# Patient Record
Sex: Female | Born: 1940 | Race: White | Hispanic: No | Marital: Married | Smoking: Former smoker
Health system: Southern US, Community
[De-identification: ages and names within clinical notes are randomized; demographics above are authoritative.]

## PROBLEM LIST (undated history)

## (undated) DIAGNOSIS — R600 Localized edema: Secondary | ICD-10-CM

## (undated) DIAGNOSIS — I4891 Unspecified atrial fibrillation: Secondary | ICD-10-CM

## (undated) DIAGNOSIS — R002 Palpitations: Secondary | ICD-10-CM

## (undated) DIAGNOSIS — N6009 Solitary cyst of unspecified breast: Secondary | ICD-10-CM

## (undated) DIAGNOSIS — H269 Unspecified cataract: Secondary | ICD-10-CM

## (undated) DIAGNOSIS — C801 Malignant (primary) neoplasm, unspecified: Secondary | ICD-10-CM

## (undated) DIAGNOSIS — I491 Atrial premature depolarization: Secondary | ICD-10-CM

## (undated) DIAGNOSIS — I10 Essential (primary) hypertension: Secondary | ICD-10-CM

## (undated) DIAGNOSIS — K222 Esophageal obstruction: Secondary | ICD-10-CM

## (undated) DIAGNOSIS — M8008XA Age-related osteoporosis with current pathological fracture, vertebra(e), initial encounter for fracture: Secondary | ICD-10-CM

## (undated) DIAGNOSIS — E039 Hypothyroidism, unspecified: Secondary | ICD-10-CM

## (undated) DIAGNOSIS — K219 Gastro-esophageal reflux disease without esophagitis: Secondary | ICD-10-CM

## (undated) DIAGNOSIS — E785 Hyperlipidemia, unspecified: Secondary | ICD-10-CM

## (undated) DIAGNOSIS — K449 Diaphragmatic hernia without obstruction or gangrene: Secondary | ICD-10-CM

## (undated) DIAGNOSIS — S2239XA Fracture of one rib, unspecified side, initial encounter for closed fracture: Secondary | ICD-10-CM

## (undated) HISTORY — PX: BREAST BIOPSY: SHX20

## (undated) HISTORY — DX: Hyperlipidemia, unspecified: E78.5

## (undated) HISTORY — PX: PARTIAL HYSTERECTOMY: SHX80

## (undated) HISTORY — DX: Fracture of one rib, unspecified side, initial encounter for closed fracture: S22.39XA

## (undated) HISTORY — DX: Unspecified cataract: H26.9

## (undated) HISTORY — DX: Diaphragmatic hernia without obstruction or gangrene: K44.9

## (undated) HISTORY — DX: Palpitations: R00.2

## (undated) HISTORY — PX: CATARACT EXTRACTION: SUR2

## (undated) HISTORY — DX: Esophageal obstruction: K22.2

## (undated) HISTORY — DX: Essential (primary) hypertension: I10

## (undated) HISTORY — PX: EYE SURGERY: SHX253

## (undated) HISTORY — PX: THYROID LOBECTOMY: SHX420

## (undated) HISTORY — DX: Localized edema: R60.0

## (undated) HISTORY — PX: VERTEBROPLASTY: SHX113

## (undated) HISTORY — DX: Malignant (primary) neoplasm, unspecified: C80.1

## (undated) HISTORY — PX: APPENDECTOMY: SHX54

## (undated) HISTORY — DX: Atrial premature depolarization: I49.1

## (undated) HISTORY — PX: ABDOMINAL HYSTERECTOMY: SHX81

## (undated) HISTORY — DX: Gastro-esophageal reflux disease without esophagitis: K21.9

## (undated) HISTORY — PX: HAND SURGERY: SHX662

## (undated) HISTORY — DX: Hypothyroidism, unspecified: E03.9

## (undated) HISTORY — DX: Age-related osteoporosis with current pathological fracture, vertebra(e), initial encounter for fracture: M80.08XA

## (undated) HISTORY — DX: Solitary cyst of unspecified breast: N60.09

---

## 2002-01-05 ENCOUNTER — Emergency Department (HOSPITAL_COMMUNITY): Admission: EM | Admit: 2002-01-05 | Discharge: 2002-01-05 | Payer: Self-pay | Admitting: Emergency Medicine

## 2002-01-05 ENCOUNTER — Encounter: Payer: Self-pay | Admitting: Emergency Medicine

## 2002-03-31 ENCOUNTER — Encounter: Payer: Self-pay | Admitting: Internal Medicine

## 2002-03-31 ENCOUNTER — Emergency Department (HOSPITAL_COMMUNITY): Admission: EM | Admit: 2002-03-31 | Discharge: 2002-03-31 | Payer: Self-pay | Admitting: Internal Medicine

## 2003-02-20 ENCOUNTER — Ambulatory Visit (HOSPITAL_COMMUNITY): Admission: RE | Admit: 2003-02-20 | Discharge: 2003-02-20 | Payer: Self-pay | Admitting: Gastroenterology

## 2003-02-20 ENCOUNTER — Encounter: Payer: Self-pay | Admitting: Gastroenterology

## 2004-04-02 ENCOUNTER — Other Ambulatory Visit: Admission: RE | Admit: 2004-04-02 | Discharge: 2004-04-02 | Payer: Self-pay | Admitting: Family Medicine

## 2004-09-07 ENCOUNTER — Emergency Department (HOSPITAL_COMMUNITY): Admission: EM | Admit: 2004-09-07 | Discharge: 2004-09-07 | Payer: Self-pay | Admitting: Emergency Medicine

## 2005-10-26 ENCOUNTER — Ambulatory Visit (HOSPITAL_COMMUNITY): Admission: RE | Admit: 2005-10-26 | Discharge: 2005-10-26 | Payer: Self-pay | Admitting: Gastroenterology

## 2007-03-10 ENCOUNTER — Ambulatory Visit (HOSPITAL_COMMUNITY): Admission: RE | Admit: 2007-03-10 | Discharge: 2007-03-10 | Payer: Self-pay | Admitting: Gastroenterology

## 2007-04-13 ENCOUNTER — Ambulatory Visit (HOSPITAL_COMMUNITY): Admission: RE | Admit: 2007-04-13 | Discharge: 2007-04-13 | Payer: Self-pay | Admitting: Gastroenterology

## 2007-08-16 ENCOUNTER — Ambulatory Visit: Payer: Self-pay | Admitting: Cardiology

## 2007-08-23 ENCOUNTER — Emergency Department (HOSPITAL_COMMUNITY): Admission: EM | Admit: 2007-08-23 | Discharge: 2007-08-23 | Payer: Self-pay | Admitting: Emergency Medicine

## 2008-02-13 ENCOUNTER — Ambulatory Visit (HOSPITAL_COMMUNITY): Admission: RE | Admit: 2008-02-13 | Discharge: 2008-02-13 | Payer: Self-pay | Admitting: Gastroenterology

## 2008-07-02 ENCOUNTER — Ambulatory Visit: Payer: Self-pay | Admitting: Cardiology

## 2008-12-11 ENCOUNTER — Ambulatory Visit: Payer: Self-pay

## 2008-12-11 ENCOUNTER — Ambulatory Visit: Payer: Self-pay | Admitting: Cardiology

## 2009-04-23 ENCOUNTER — Ambulatory Visit: Payer: Self-pay | Admitting: Cardiology

## 2010-03-19 ENCOUNTER — Encounter: Admission: RE | Admit: 2010-03-19 | Discharge: 2010-06-17 | Payer: Self-pay | Admitting: Family Medicine

## 2010-10-05 LAB — HM MAMMOGRAPHY

## 2011-01-27 ENCOUNTER — Encounter: Payer: Self-pay | Admitting: Family Medicine

## 2011-01-27 DIAGNOSIS — E039 Hypothyroidism, unspecified: Secondary | ICD-10-CM

## 2011-01-27 DIAGNOSIS — K222 Esophageal obstruction: Secondary | ICD-10-CM

## 2011-01-27 DIAGNOSIS — K219 Gastro-esophageal reflux disease without esophagitis: Secondary | ICD-10-CM

## 2011-01-28 ENCOUNTER — Encounter: Payer: Self-pay | Admitting: Family Medicine

## 2011-01-28 DIAGNOSIS — K219 Gastro-esophageal reflux disease without esophagitis: Secondary | ICD-10-CM | POA: Insufficient documentation

## 2011-01-28 DIAGNOSIS — K222 Esophageal obstruction: Secondary | ICD-10-CM | POA: Insufficient documentation

## 2011-01-28 DIAGNOSIS — K5900 Constipation, unspecified: Secondary | ICD-10-CM | POA: Insufficient documentation

## 2011-01-28 DIAGNOSIS — M8000XA Age-related osteoporosis with current pathological fracture, unspecified site, initial encounter for fracture: Secondary | ICD-10-CM | POA: Insufficient documentation

## 2011-03-02 NOTE — Assessment & Plan Note (Signed)
[facility]                            CARDIOLOGY OFFICE NOTE   NAME:[name]                     MRN:          [id number]  DATE:08/16/2007                            DOB:          [birth year]    PRIMARY CARE PHYSICIAN:  Dr. Vernon Prey.   REASON FOR VISIT:  Evaluate patient with abnormal EKG.   HISTORY OF PRESENT ILLNESS:  The patient is a pleasant, 70 year old  without prior cardiac history. She was noted recently to have an  irregular heart rate. The EKG suggested the possibility of atrial  flutter. She was referred for evaluation of this. I reviewed this EKG  available from Dr. Kathi Der office. It demonstrates sinus rhythm with  multifocal premature atrial contractions.   The patient states she does occasionally note palpitations. However, she  does not have any episodes of presyncope or syncope. She does not have  any chest discomfort, neck or arm discomfort. She has had no shortness  of breath and denies any PND or orthopnea.   PAST MEDICAL HISTORY:  Hyperlipidemia, hypothyroidism, esophageal  stricture.   PAST SURGICAL HISTORY:  Thyroid surgery, cataract surgery, partial  hysterectomy.   ALLERGIES:  SULFA.   MEDICATIONS:  1. Lipitor 40 mg daily.  2. Nexium 40 mg daily.  3. Synthroid 100 mcg daily.  4. Lorazepam 15 mg t.i.d.   SOCIAL HISTORY:  The patient is a housewife. She is married. She has no  children. She smoked 1 pack per day for 45 years but quit 4 years ago.  She does not inhale cigarettes.   FAMILY HISTORY:  Contributory for early coronary disease in 1 brother  and 1 sister who are status post bypass.   REVIEW OF SYSTEMS:  As stated in the HPI and positive for wearing  glasses. Negative for other systems.   PHYSICAL EXAMINATION:  GENERAL:  The patient is in no distress.  VITAL SIGNS:  Blood pressure 122/88, heart rate 93 and regular, body  mass index 22, weight 122 pounds.  HEENT:  Eyelids unremarkable. Pupils  equal round and reactive to light.  Fundi not visualized. Oral mucosa unremarkable.  NECK:  No jugular venous distention at 45 degrees, carotid upstroke  brisk and symmetric, no bruits, no thyromegaly.  LYMPHATICS:  No cervical, axillary or inguinal adenopathy.  LUNGS:  Clear to auscultation bilaterally.  BACK:  No costovertebral angle tenderness.  CHEST:  Unremarkable.  HEART:  PMI not displaced or sustained, S1 and S2 within normal limits,  no S3, no clicks, no rubs, no murmurs.  ABDOMEN:  Flat, positive bowel sounds, normal in frequency and pitch, no  bruits, no rebound, no guarding, no midline pulsatile mass, no  hepatomegaly, no splenomegaly.  SKIN:  No rashes, no nodules.  EXTREMITIES:  2+ pulses upper, 2+ femorals bilaterally, 1+ popliteals,  dorsalis pedis and posterior tibialis bilaterally, no cyanosis, no  clubbing, no edema.  NEUROLOGIC:  Oriented to person, place and time. Cranial nerves II-XII  grossly intact. Motor grossly intact.   EKG:  Sinus rhythm, rate 93, axis within normal limits, interval is  within normal limits, premature atrial  contractions.   ASSESSMENT/PLAN:  1. Palpitations. The patient has premature atrial contractions but no      other dysrhythmia. These are not particularly problematic. At this      point, no further cardiovascular testing is suggested. I will defer      to Dr. Christell Constant to make sure that she has had thyroid function studies      and recent BMET and magnesium. She could come back to see me for      management if she has any further symptoms related to these.  2. Decreased peripheral pulses. The patient has some decreased      peripheral pulses. I do not appreciate any bruits or any other      evidence of vascular disease. She certainly does not have any      claudication and I questioned her carefully. If in the future, she      starts a walking regimen, as I suggest, and she finds any      limitations in her ability to do this she should  get ankle brachial      indices.  3. Followup. I will see her back as needed.     Rollene Rotunda, MD, [facility]  Electronically Signed    JH/MedQ  DD: 08/16/2007  DT: 08/17/2007  Job #: [id number]   cc:   Ernestina Penna, M.D.

## 2011-03-02 NOTE — Op Note (Signed)
NAMECALVIN, [name]              ACCOUNT NO.:  [id number]   MEDICAL RECORD NO.:  [id number]          PATIENT TYPE:  AMB   LOCATION:  ENDO                         FACILITY:  [facility]   PHYSICIAN:  Petra Kuba, M.D.    DATE OF BIRTH:  [birth year]   DATE OF PROCEDURE:  DATE OF DISCHARGE:                               OPERATIVE REPORT   PROCEDURE PERFORMED:  Esophagogastroduodenoscopy with Savary dilatation.   INDICATIONS:  Patient with a history of known strictures, worsening  dysphagia.  A consent was signed after the risks and benefits, methods,  and options thoroughly discussed in the office on multiple occasions.   MEDICATIONS USED:  Fentanyl 75 mcg, versed 10 mg.   PROCEDURE IN DETAIL:  First the video endoscope was inserted by direct  vision.  Unfortunately, her proximal ring at about 20-cm was fairly  tight and despite gentle pressure, could not advance past it.  We did  cause a small Mallory-Weiss tear just above it in trying gentle  pressure.  We elected to withdraw.  We then inserted the pediatric  videoendoscope and it easily was able to be advanced past both the  proximal stricture and the stricture at the GE junction, although that  was fairly tight as well.  She did have a small hiatal hernia.  The  second stricture was just above that.  The scope passed into the  stomach, advanced through a normal antrum, normal pylorus, into a normal  duodenal bulb and around the celiac to a normal second portion of the  duodenum.  The scope was withdrawn back to the bulb and a good look  there ruled out abnormalities in that location.  The scope was withdrawn  back to the stomach and retroflexed.  High in the cardia, the hiatal  hernia was confirmed.  The angularis, lesser and greater curve were  evaluated, and fundus was evaluated on retroflexed visualization without  additional findings.  Straight visualization of the stomach was normal.   The scope was then slowly withdraw back  to about 16 cm.  There was  minimal trauma of both the rings.  No significant tear.  We elected to  proceed with the dilation at this juncture.  The scope was inserted into  the antrum.  The Savary wire was advanced under fluoroscopic guidance.  The customary J loop of the wire was confirmed both endoscopically and  under fluoroscopy.  The scope was removed, making sure to keep the wire  in the proper position.  In succession, the Savary 8-, 10-, and 12-mm  dilators were all advanced.  After the 12 was confirmed in the stomach  and advancing the 12, there was minimal resistance.  There was not any  resistance on the 8 or the 10.  There was trace heme on the 12.  We  elected to stop the procedure at this junction.  The wire was withdrawn  back into the dilator, both were removed in tandem.  We did go ahead and  reinsert the pediatric endoscope which again confirmed the trauma to  both rings, but there was no active  bleeding or significant tears.  The  procedure was terminated at this junction.   The patient tolerated the procedure well.  There was no obvious  immediate complication.   ENDOSCOPIC DIAGNOSES:  1. Acute tight strictures at the proximal esophagus 20 cm.  Unable to      pass the regular scope; able to pass the pediatric endoscope past      both strictures.  2. Small hiatal hernia with a proximal ring just above it.  3. Otherwise normal esophagogastroduodenoscopy therapy Savary      dilatation to 12 mm under fluoroscopy only.   PLAN:  1. Continue Nexium.  2. Hold aspirin.  3. Very slowly advance diet.  4. Repeat endoscopy and dilation in two to three weeks and also      proceed with her screening colonoscopy at that juncture.      Hopefully, doing both will be safer now that she is slightly      dilated.           ______________________________  Petra Kuba, M.D.     MEM/MEDQ  D:  03/10/2007  T:  03/10/2007  Job:  [id number]   cc:   Ernestina Penna, M.D.

## 2011-03-02 NOTE — Assessment & Plan Note (Signed)
[facility]                            CARDIOLOGY OFFICE NOTE   NAME:Tilmon, HENRINE HAYTER                     MRN:          [id number]  DATE:12/11/2008                            DOB:          1941/06/09    PRIMARY CARE PHYSICIAN:  Ernestina Penna, MD   REASON FOR PRESENTATION:  Evaluate the patient with leg pain.   HISTORY OF PRESENT ILLNESS:  The patient presents for followup of the  above.  I saw him in September for the same complaint and ordered ABIs.  However, the patient did not have these done.  She actually thought that  the pain was getting better with aspirin.  However, she was referred  back because she had a severe episode on the November 23, 2008.  This was  discomfort in her legs that woke her up from her sleep.  She had ache  the whole way down her legs.  It slowly went away.  She did not describe  cramping.  She does not describe that this discomfort necessarily gets  worse with walking.  She is limited in her activities because of back  pain and also some abdominal cramping.  However, it is not clear on  discussing with her that she has limited by any leg cramping or joint  pain.  She does think that her legs ache more if she does not take the  aspirin.  She does not have any weakness in her legs.  She says she does  about what she wants around the house though her husband tries to limit  her because of her bad back.  She is not describing any chest pressure,  neck or arm discomfort.  She has had no palpitation, presyncope, or  syncope.   She did have ABIs today, which demonstrate mild reduction 0.77 on the  right, 0.75 on the left with probable tibial disease bilaterally.   PAST MEDICAL HISTORY:  1. Hyperlipidemia.  2. Hypothyroidism.  3. Esophageal stricture.   PAST SURGICAL HISTORY:  1. Thyroid surgery.  2. Cataract surgery.  3. Partial hysterectomy.   ALLERGIES:  SULFA.   MEDICATIONS:  1. Lipitor 80 mg daily.  2.  Levothyroxine 100 mcg daily.  3. Oxazepam.  4. Nexium 40 mg daily.  5. Hydrochlorothiazide 25 mg daily.  6. Aspirin 243 mg daily.  7. Potassium.   REVIEW OF SYSTEMS:  As stated in the HPI and otherwise negative for  other systems.   PHYSICAL EXAMINATION:  GENERAL:  The patient is in no distress.  VITAL SIGNS:  Blood pressure 115/77, heart rate 92 and regular, weight  117 pounds, body mass index 22.  HEENT:  Eyes unremarkable.  Pupils equal, round, and reactive to light,  fundi not visualized, oral mucosa unremarkable.  NECK:  No jugular venous distension at 45 degrees, carotid upstroke  brisk and symmetrical.  No bruits.  No thyromegaly.  LYMPHATIC:  No cervical, axillary, or inguinal adenopathy.  LUNGS:  Clear to auscultation bilaterally.  BACK:  No costovertebral angle tenderness.  CHEST:  Unremarkable.  HEART:  PMI not displaced or sustained.  S1  and S2 within normal limits.  No S3, no S4.  No clicks, no rubs, no murmurs.  ABDOMEN:  Flat, positive bowel sounds, normal in frequency and pitch.  No bruits, no rebound, no guarding.  No midline pulsatile mass, no  hepatomegaly, no splenomegaly.  SKIN:  No rashes, no nodules.  EXTREMITIES:  2+ pulses upper, 2+ femorals without bruits, 1+ dorsalis  pedis and posterior tibialis bilaterally.  No cyanosis, no clubbing, no  edema.  NEURO:  Oriented to person, place, and time; cranial nerves II-XII  grossly intact; motor grossly intact.   EKG; sinus rhythm, rate 92, axis within normal limits, intervals within  normal limits, no acute ST-wave changes.   ASSESSMENT AND PLAN:  1. Leg pain.  The patient's leg pain has some typical features for      claudication, but otherwise some atypical features.  She has this      discomfort intermittently, which makes me think it is probably not      the Lipitor, although I could not exclude this absolutely.      Finally, she does have back problems and it is not clear that this      is not  contributing.  At this point, I am not going to suggest      further therapy other than a walking regimen to see if we can      improve any of these symptoms.  She will let me know if her      symptoms worsen rather than get better.  She is going to continue      the aspirin.  We could try Pletal.  I could try a course of holding      the Lipitor though I would rather not do this.  I will be happy to      reassess her and could refer her to one of our vascular doctors as      well.  2. Dyslipidemia.  Per Dr. Christell Constant.  Given the fact that she has      peripheral vascular disease as indicated by her ABIs.  I will make      sure she has every aggressive goal with an LDL less than 70 and HDL      greater than 50.  3. Hypertension.  Blood pressure is well controlled and she will      continue the medications as listed.  4. Hypothyroidism.  This is followed by Dr. Christell Constant.  5. Followup.  I will see the patient again in 4 months or sooner if      needed.     Rollene Rotunda, MD, [facility]  Electronically Signed    JH/MedQ  DD: 12/11/2008  DT: 12/12/2008  Job #: [id number]   cc:   Ernestina Penna, M.D.

## 2011-03-02 NOTE — Op Note (Signed)
NAMEJENNA, [name]              ACCOUNT NO.:  [id number]   MEDICAL RECORD NO.:  [id number]          PATIENT TYPE:  AMB   LOCATION:  ENDO                         FACILITY:  [facility]   PHYSICIAN:  Petra Kuba, M.D.    DATE OF BIRTH:  [birth year]   DATE OF PROCEDURE:  02/13/2008  DATE OF DISCHARGE:                               OPERATIVE REPORT   PROCEDURE:  Esophagogastroduodenoscopy with Savary dilatation.   INDICATION:  Dysphagia, history of distal ring and held by dilatation.  Consent was signed after risks, benefits, methods, options thoroughly  discussed multiple times in the past.   MEDICINES USED:  Fentanyl 75 mcg, Versed 5 mg.   PROCEDURE:  The pediatric video endoscope was inserted by direct vision.  The esophagus was normal and distal esophagus was a fairly tight  stricture, benign-appearing ring.  The scope with very minimal  resistance and minimal trauma was able to be advanced into the stomach.  This was probably not as tight as it has been at some points in the  past.  She did have a small hiatal hernia.  Scope passed into a normal  antrum, normal pylorus into a normal duodenal bulb and around the C-loop  to a normal second portion of the duodenum.  Scope was withdrawn back to  the bulb and a good look there ruled out abnormalities at the end of the  case.  Scope was withdrawn back stomach and retroflexed.  High in the  cardia, the hiatal hernia was confirmed.  Fundus, angularis, lesser and  greater curve were normal on retroflexed visualization.  Straight  visualization of the stomach did not reveal any additional findings.  Scope was slowly withdrawn back to about 15 cm, again confirming the  normal esophagus.  Scope was then re-advanced to the antrum.  It did  pass more readily through the GE junction and under fluoro guidance, the  Savary wire was advanced, the customary J loop was confirmed  endoscopically and under fluoro.  The scope was removed making sure  that  the wire stayed in the proper position under fluoro and once removed in  succession, the Savary 11, 12.8, 14 and 15 mm dilators were all advanced  and confirmed in the proper position in the stomach.  There was trace  heme on the last three dilators.  There was only proximal resistance on  passing all the dilators and then at the GE junction.  Once the 15 was  confirmed in the stomach, the wire was withdrawn back into the dilator,  both were removed.  The procedure was terminated at this junction.  The  patient tolerated the procedure well.  There was no obvious immediate  complication.   ENDOSCOPIC DIAGNOSES:  1. Distal esophageal ring, minimal trauma passing the pediatric scope,      very minimal resistance.  2. Small hiatal hernia.  3. Otherwise normal esophagogastroduodenoscopy.   THERAPY:  Savary dilatation under fluoro to 15 mm.   PLAN:  See how this dilation works.  Follow-up p.r.n. or in two months.  Consider balloon dilatation to possibly be more aggressive since  it is  hard to know whether she has a proximal posterior pharynx, stricture or  just the smaller cervical esophagus.  However, the patient's dysphagia  seems to be more at the GE junction and not in her neck, so maybe she  would be better served with balloons in the future.           ______________________________  Petra Kuba, M.D.     MEM/MEDQ  D:  02/13/2008  T:  02/13/2008  Job:  [id number]   cc:   Ernestina Penna, M.D.  Fax: [phone]

## 2011-03-02 NOTE — Op Note (Signed)
[name], [name]              ACCOUNT NO.:  [id number]   MEDICAL RECORD NO.:  [id number]          PATIENT TYPE:  AMB   LOCATION:  ENDO                         FACILITY:  [facility]   PHYSICIAN:  Petra Kuba, M.D.    DATE OF BIRTH:  Jul 06, 1941   DATE OF PROCEDURE:  04/13/2007  DATE OF DISCHARGE:                               OPERATIVE REPORT   PROCEDURE:  EGD with Savary dilatation.   INDICATIONS:  Patient with recurrent strictures, only able to dilate to  12 last time, want to increase the dilation.  Consent was signed after  all risks, benefits, methods, options thoroughly discussed multiple  times in the past.   ADDITIONAL MEDICINES FOR THIS PROCEDURE:  Fentanyl 50 mcg, Versed 5 mg.   PROCEDURE:  The pediatric video endoscope was inserted by direct vision.  No obvious proximal stricture was seen.  Her esophagus was normal.  She  did have a distal fibrous ring and a small hiatal hernia.  Scope passed  into the stomach were some atrophic gastritis was seen, advanced through  a normal antrum, normal pylorus and into a normal duodenal bulb and  around the C-loop to a normal second portion of the duodenum.  Scope was  withdrawn back to the bulb and a good look there ruled out any  abnormalities in all locations.  The scope was withdrawn back to the  stomach and retroflexed.  High in the cardia the hiatal hernia was  confirmed.  The fundus, angularis, lesser and greater curve were normal  on retroflexed and straight visualization without additional stomach  findings.  Scope was withdrawn back to about 18 cm.  A good look at the  esophagus confirmed above findings.  Scope was then advanced to the  antrum and confirmed in the proper position under fluoro.  The Savary  wire was advanced.  The customary J loop was confirmed endoscopically  and under fluoro, and under fluoro guidance the scope was removed making  sure to keep the wire in the proper location.  We then proceeded with  dilation using the 12.8, 14 and 15 mm dilators.  There was minimal  resistance in advancing the 12.8 and the 15.  The 14 passed without  resistance.  There was no heme on the 12.8, with trace heme on both the  14 and the 15.  We elected to stop the procedure at this juncture.  The  wire was withdrawn back into the dilator and both were removed in  tandem.  The patient tolerated the procedure well.  There was no obvious  immediate complication.   ENDOSCOPIC DIAGNOSES:  1. Distal fibrous ring and small hiatal hernia.  2. Atrophic gastritis.  3. Otherwise normal EGD.   THERAPY:  Savary dilatation under fluoro to 15 mm.   PLAN:  Continue Nexium.  Slowly advance diet.  Begin with clear liquids  for 4 hours and then soft solids.  No aspirin or nonsteroidals for 3  days.  Follow up with me p.r.n. or in 2 months to recheck symptoms and  make sure no further workup plans or more aggressive  dilatation is  needed.           ______________________________  Petra Kuba, M.D.     MEM/MEDQ  D:  04/13/2007  T:  04/13/2007  Job:  [id number]   cc:   Ernestina Penna, M.D.  Fax: [phone]

## 2011-03-02 NOTE — Op Note (Signed)
[name], MANISCALCO              ACCOUNT NO.:  [id number]   MEDICAL RECORD NO.:  [id number]          PATIENT TYPE:  AMB   LOCATION:  ENDO                         FACILITY:  [facility]   PHYSICIAN:  Petra Kuba, M.D.    DATE OF BIRTH:  [birth year]   DATE OF PROCEDURE:  04/13/2007  DATE OF DISCHARGE:                               OPERATIVE REPORT   PROCEDURE:  Colonoscopy.   INDICATIONS:  Screening.   CONSENT:  Signed after risks, benefits, methods, and options thoroughly  discussed multiple times in the past.   MEDICINES USED:  Fentanyl 100 mcg, Versed 10 mg.   DESCRIPTION OF PROCEDURE:  Video endoscope was inserted by direct vision  after rectal inspection is pertinent for external hemorrhoids.  Small  digital exam was negative.  The scope was inserted and easily advanced  to the level of the ileocecal valve.  To advance to the cecal pole  required rolling her on her back and abdominal pressure.  No  abnormalities were seen on insertion.   The cecum was identified by the appendiceal orifice and ileocecal valve.  Prep was adequate.  There was some liquid stool that required washing  and suctioning.  On slow withdrawal through the colon no polyps, tumors,  masses, or diverticula were seen as we slowly withdrew back to the  rectum.  Once back in the rectum anorectal pull-through on retroflexion  confirmed some small hemorrhoids.   Scope was straightened and readvanced a short ways up the left side of  the colon.  Air was suctioned and the scope removed.  The patient  tolerated the procedure well.  There was no obvious immediate  complication.   ENDOSCOPIC DIAGNOSES:  1. Internal and external small hemorrhoids.  2. Otherwise within normal limits to the cecum.   PLAN:  Continue workup with an EGD and repeat dilation, to be more  aggressive than the last one.  Repeat colon screening p.r.n. or in 10  years.  We will decide followup based on dilation, but probably in 2-3  months to  recheck symptoms.           ______________________________  Petra Kuba, M.D.     MEM/MEDQ  D:  04/13/2007  T:  04/13/2007  Job:  [id number]   cc:   Ernestina Penna, M.D.  Fax: [phone]

## 2011-03-02 NOTE — Assessment & Plan Note (Signed)
[facility]                            CARDIOLOGY OFFICE NOTE   NAME:[name], [name]                     MRN:          [id number]  DATE:04/23/2009                            DOB:          Nov 07, 1940    PRIMARY CARE PHYSICIAN:  Ernestina Penna, MD   REASON FOR PRESENTATION:  Evaluate the patient with peripheral vascular  disease.   HISTORY OF PRESENT ILLNESS:  The patient presents for followup of the  above.  She is 70 years old now.  She has reduced ABIs as described  below.  I prescribed a walking regimen which she was doing although she  is not doing it as much in the heat.  She says she is not having as much  leg pain as she was having.  She is certainly not having any resting  pain.  She thinks she is feeling better.  She is not describing any new  shortness of breath or chest discomfort.  She has no new complaints that  would necessitate further change in therapy.  She is having no new joint  complaints, so I would not be able to implicate Lipitor as a cause of  her problems.   PAST MEDICAL HISTORY:  1. Peripheral vascular disease (ABI is 0.77 on the right, 0.75 on the      left).  2. Hyperlipidemia.  3. Hypothyroidism.  4. Esophageal stricture.  5. Thyroid surgery.  6. Cataract surgery.  7. Partial hysterectomy.   ALLERGIES:  SULFA.   MEDICATIONS:  1. Lipitor 80 mg daily.  2. Levothyroxine 100 mcg daily.  3. Nexium 40 mg daily.  4. Hydrochlorothiazide 25 mg daily.  5. Aspirin 81 mg 3 tablets daily.  6. Potassium.   REVIEW OF SYSTEMS:  As stated in the HPI and otherwise negative for all  other systems.   PHYSICAL EXAMINATION:  GENERAL:  The patient is pleasant and in no  distress.  VITAL SIGNS:  Blood pressure 138/86, heart rate 97 and regular.  HEENT:  Eyelids unremarkable.  Pupils equal, round, and reactive to  light.  Fundi not visualized.  Oral mucosa unremarkable.  NECK:  No jugular venous distention at 45 degrees,  carotid upstroke  brisk and symmetric.  No bruits, no thyromegaly.  LYMPHATICS:  No cervical, axillary, inguinal adenopathy.  LUNGS:  Clear to auscultation bilaterally.  BACK:  No costovertebral angle tenderness.  CHEST:  Unremarkable.  HEART:  PMI not displaced or sustained.  S1 and S2 within normal limits.  No S3, no S4.  No clicks, no rubs, no murmurs.  ABDOMEN:  Flat, positive bowel sounds normal in frequency and pitch.  No  bruits, no rebound, no guarding, no midline pulsatile mass, no  organomegaly.  SKIN:  No rashes, no nodules.  EXTREMITIES:  Upper pulse 2+, 2+ femorals without bruits, 1+ dorsalis  pedis and posterior tibialis bilaterally.  No cyanosis, no clubbing, no  edema.  NEUROLOGIC:  Grossly intact.   EKG:  Sinus rhythm, rate 97, axis within normal limits, intervals within  normal limits, no acute ST-T wave changes.   ASSESSMENT AND PLAN:  1. Leg pain.  The patient's leg pain is improved.  Therefore, I would      not suspect the Lipitor.  I do not think she needs to have Pletal      added.  I would suggest conservative therapy with a walking      regimen.  I would suggest continued aggressive secondary risk      reduction with particular attention paid to the dyslipidemia.  I      can see her back as needed if she has further pain.  She      understands the need for good foot hygiene.  2. Hypertension.  Blood pressure is controlled, and she will continue      the medicines as listed.  3. Dyslipidemia per Dr. Christell Constant with a goal LDL less than 100 and HDL      greater than 50.   FOLLOWUP:  I will see her back as needed.     Rollene Rotunda, MD, [facility]  Electronically Signed    JH/MedQ  DD: 04/23/2009  DT: 04/24/2009  Job #: [id number]   cc:   Ernestina Penna, M.D.

## 2011-03-02 NOTE — Assessment & Plan Note (Signed)
[facility]                            CARDIOLOGY OFFICE NOTE   NAME:Longino, COPELYN WIDMER                     MRN:          [id number]  DATE:07/02/2008                            DOB:          1941-10-10    PRIMARY CARE PHYSICIAN:  Ernestina Penna, MD.   REASON FOR PRESENTATION:  Evaluate the patient with leg pain.   HISTORY OF PRESENT ILLNESS:  The patient is referred back for evaluation  of leg discomfort.  I saw her in October 2008 for palpitations.  At that  time, I suggested no further no cardiovascular testing.  However, I was  impressed that she had decreased peripheral pulses, but she was not  feeling any claudication.  She said that recently she started noticing  leg pain.  She thought it was a significant burning, discomfort and  happened both at rest and with activity.  It was down in both legs.  She  was started taking aspirin 243 mg daily and she said that after this,  she has had resolution of this discomfort.  She is not longer having  that pain.  She is noticing some palpitations occasionally, but these  are in particularly problematic.  She is not having any presyncope or  syncope.  She is not describing any chest discomfort, neck, or arm  discomfort.  She has had no new shortness of breath, PND, or orthopnea.  She states she had a decreased appetite and has lost a few pounds.   PAST MEDICAL HISTORY:  1. Hyperlipidemia.  2. Hypothyroidism.  3. Esophageal stricture.   PAST SURGICAL HISTORY:  Thyroid surgery, cataract surgery, and partial  hysterectomy.   ALLERGIES:  SULFA.   MEDICATIONS:  1. Lipitor 80 mg daily.  2. Levothyroxine 100 mcg daily.  3. Oxazepam 15 mg p.r.n.  4. Nexium 40 mg daily.  5. Hydrochlorothiazide 25 mg daily.  6. Aspirin 243 mg daily.   REVIEW OF SYSTEMS:  As stated in the HPI and otherwise negative for all  other systems.   PHYSICAL EXAMINATION:  GENERAL:  The patient is in no acute distress.  VITAL  SIGNS:  Blood pressure 134/89, heart rate 111 and regular, and  weight 116 pounds.  HEENT:  Eyes Unremarkable.  Pupils equal round and reactive to light.  Fundi not visualized.  Oral mucosa unremarkable.  NECK:  No jugular venous distention at 45 degrees.  Carotid upstroke  brisk and symmetric.  No bruits, no thyromegaly.  LYMPHATICS:  No cervical, axillary, or inguinal adenopathy.  LUNGS:  Clear to auscultation bilaterally.  BACK:  No costovertebral angle tenderness.  CHEST:  Unremarkable.  HEART:  PMI not displaced or sustained.  S1 and S2 within normal limits,  no S3, and no S4.  No clicks, no rubs, and no murmurs.  ABDOMEN:  Flat.  Positive bowel sounds.  Normal in frequency and pitch.  No bruits, no rebound, and no guarding.  No midline pulsatile mass.  No  hepatomegaly, no splenomegaly.  SKIN:  No rashes, no nodules.  EXTREMITIES: 2+ upper pulses, 2+ femorals without bruits, 1+ popliteal  bilaterally, 1+ dorsalis  pedis and posterior tibialis bilaterally.  No  cyanosis, no clubbing, and no edema.  NEUROLOGIC:  Oriented to person, place, and time.  Cranial nerves II  through XII grossly intact.  Motor grossly intact.   EKG; sinus tachycardia, rate 105, axis within normal limits, intervals  within normal limits, and no acute ST-T wave changes.   ASSESSMENT AND PLAN:  1. Claudication.  The patient is describing claudication.  She has had      reduced pulses.  I am going to check ABIs.  However, her symptoms      are not much improved on aspirin, which may be all we need to do.      She would need continued risk reduction as well.  If she has any      recurrent discomfort after aspirin in a walking regimen, I would      suggest Peripheral Vascular consult with either Dr. Excell Seltzer or      McKenzie.  2. Tachycardia.  The patient has sinus tachycardia.  She is not      describing any significant palpitations at this point.  I would      defer to Dr. Christell Constant to see if she is up-to-date  with her thyroid      status.  Beyond that in the absence of any symptoms, I would      suggest no further cardiovascular testing.  3. Dyslipidemia.  The patient was followed by Dr. Christell Constant.  Given her      reduced lower extremity pulses that would be aggressive with her      lipid management with a goal LDL less than 100 and HDL greater than      50.  4. Followup.  I will see the patient again as needed in the based on      the results of the ABIs.     Rollene Rotunda, MD, [facility]  Electronically Signed    JH/MedQ  DD: 07/02/2008  DT: 07/03/2008  Job #: [id number]   cc:   Ernestina Penna, M.D.

## 2011-04-27 ENCOUNTER — Ambulatory Visit (HOSPITAL_COMMUNITY): Payer: Medicare HMO

## 2011-04-27 ENCOUNTER — Other Ambulatory Visit (INDEPENDENT_AMBULATORY_CARE_PROVIDER_SITE_OTHER): Payer: Self-pay | Admitting: Ophthalmology

## 2011-04-27 ENCOUNTER — Ambulatory Visit (HOSPITAL_COMMUNITY)
Admission: RE | Admit: 2011-04-27 | Discharge: 2011-04-28 | Disposition: A | Discharge status: Home / Self Care | Payer: Medicare HMO | Source: Ambulatory Visit | Attending: Ophthalmology | Admitting: Ophthalmology

## 2011-04-27 DIAGNOSIS — Z01812 Encounter for preprocedural laboratory examination: Secondary | ICD-10-CM | POA: Insufficient documentation

## 2011-04-27 DIAGNOSIS — Y831 Surgical operation with implant of artificial internal device as the cause of abnormal reaction of the patient, or of later complication, without mention of misadventure at the time of the procedure: Secondary | ICD-10-CM | POA: Insufficient documentation

## 2011-04-27 DIAGNOSIS — Z79899 Other long term (current) drug therapy: Secondary | ICD-10-CM | POA: Insufficient documentation

## 2011-04-27 DIAGNOSIS — I10 Essential (primary) hypertension: Secondary | ICD-10-CM | POA: Insufficient documentation

## 2011-04-27 DIAGNOSIS — Z01811 Encounter for preprocedural respiratory examination: Secondary | ICD-10-CM

## 2011-04-27 DIAGNOSIS — T8529XA Other mechanical complication of intraocular lens, initial encounter: Secondary | ICD-10-CM | POA: Insufficient documentation

## 2011-04-27 DIAGNOSIS — Z01818 Encounter for other preprocedural examination: Secondary | ICD-10-CM | POA: Insufficient documentation

## 2011-04-27 LAB — BASIC METABOLIC PANEL
BUN: 16 mg/dL (ref 6–23)
CO2: 32 mEq/L (ref 19–32)
Calcium: 9.2 mg/dL (ref 8.4–10.5)
GFR calc non Af Amer: >60 mL/min (ref >60)
Glucose, Bld: 92 mg/dL (ref 70–99)

## 2011-04-27 LAB — CBC
HCT: 35.9 % — ABNORMAL LOW (ref 36.0–46.0)
Hemoglobin: 12.1 g/dL (ref 12.0–15.0)
MCH: 30.2 pg (ref 26.0–34.0)
MCHC: 33.7 g/dL (ref 30.0–36.0)

## 2011-05-07 NOTE — Op Note (Signed)
[name], [name]              ACCOUNT NO.:  [id number]  MEDICAL RECORD NO.:  [id number]  LOCATION:  5128                         FACILITY:  [facility]  PHYSICIAN:  Beulah Gandy. Ashley Royalty, M.D. DATE OF BIRTH:  Nov 17, 1940  DATE OF PROCEDURE:  04/27/2011 DATE OF DISCHARGE:                              OPERATIVE REPORT   ADMISSION DIAGNOSIS:  Dislocated intraocular lens, left eye into the vitreous left eye.  PROCEDURES:  Pars plana vitrectomy left eye, retinal photocoagulation left eye, removal of IOL from vitreous left eye and secondary intraocular lens placement with suture, left eye.  SURGEON:  Beulah Gandy. Ashley Royalty, MD  ASSISTANT:  Rosalie Doctor, MA  ANESTHESIA:  General.  DETAILS:  Usual prep and drape, conjunctival peritomy from 8 o'clock around to 4 o'clock, half-thickness scleral flaps raised at 3 and 9 o'clock in anticipation of IOL suture.  Three layer corneal wound from 10 to 2 o'clock.  Trocars placed in the pars plana at 10, 2 and 4 o'clock with infusion at 4.  Pars plana vitrectomy was begun just behind the pupillary axis where the intraocular lens was seen to be ensnared in vitreous.  The vitreous was trimmed from around the intraocular lens. The capsular remnants were loose and not attached to the wall of the eye.  The corneoscleral wound was opened with keratome.  After the lens was freed from the vitreous entrapment, a 20-gauge forceps was used to grasp the intraocular lens and remove it from the posterior segment, then the anterior segment, and then out through the scleral wound. Additional vitrectomy was carried out at this point to remove all vitreous remnants from iris to wound.  Once the wound was swept, it was clean.  Two 10-0 Prolene sutures were placed beneath the scleral flaps from 3 o'clock to 9 o'clock behind the iris and in the ciliary sulcus. The scleral sutures were drawn out through the corneoscleral wound.  A new intraocular lens made by [employer]. model CC7-0BD, model 20.0D, length 12.5 mm, optic 7.0 mm, serial number [id number] was brought onto the field.  It was inspected and cleaned.  The eyelets of the lens were attached to Prolene sutures.  The intraocular lens was passed through the corneal wound, into the anterior chamber, then into the posterior chamber.  It was dialed into place.  The Prolene sutures were drawn securely and knotted beneath the scleral flaps.  The flaps were allowed to cover the knots.  Five interrupted 10-0 nylon sutures were used to close the corneoscleral wound.  The wound was tested and found to be tight.  Additional vitrectomy was carried out at this point. The indirect ophthalmoscope laser was moved into place and 699 burns were placed around the retinal periphery in weak and thin areas of retina tissue.  The power was 400 milliwatts, 1000 microns each, and 0.1 seconds each.  The instruments were removed from the eyes and the trocar was removed from the eye.  The wounds were tested and found to be tight. Closing pressure was 10 with a Risk manager.  The conjunctiva was closed with 7-0 chromic suture.  Polymyxin and gentamicin were irrigated into tenon space.  Marcaine was injected around  the globe for postop pain.  Decadron 10 mg was injected to the lower subconjunctival space. Polysporin ophthalmic ointment, patch, and shield were placed.  The patient was awakened and taken to recovery in satisfactory condition.  COMPLICATIONS:  None.  DURATION:  One hour 15 minutes.     Beulah Gandy. Ashley Royalty, M.D.     JDM/MEDQ  D:  04/27/2011  T:  04/28/2011  Job:  [id number]  Electronically Signed by Alan Mulder M.D. on 05/07/2011 09:55:03 AM

## 2011-05-25 ENCOUNTER — Encounter: Payer: Self-pay | Admitting: Cardiology

## 2011-06-04 ENCOUNTER — Encounter (INDEPENDENT_AMBULATORY_CARE_PROVIDER_SITE_OTHER): Payer: Medicare HMO | Admitting: Ophthalmology

## 2011-06-04 DIAGNOSIS — H43819 Vitreous degeneration, unspecified eye: Secondary | ICD-10-CM

## 2011-06-04 DIAGNOSIS — H2700 Aphakia, unspecified eye: Secondary | ICD-10-CM

## 2011-06-04 DIAGNOSIS — H35039 Hypertensive retinopathy, unspecified eye: Secondary | ICD-10-CM

## 2011-08-04 ENCOUNTER — Encounter (INDEPENDENT_AMBULATORY_CARE_PROVIDER_SITE_OTHER): Payer: Medicare HMO | Admitting: Ophthalmology

## 2012-10-26 ENCOUNTER — Encounter: Payer: Self-pay | Admitting: Cardiology

## 2013-02-14 ENCOUNTER — Encounter: Payer: Self-pay | Admitting: Family Medicine

## 2013-02-14 ENCOUNTER — Ambulatory Visit (INDEPENDENT_AMBULATORY_CARE_PROVIDER_SITE_OTHER): Payer: Medicare HMO | Admitting: Family Medicine

## 2013-02-14 VITALS — BP 99/70 | HR 75 | Temp 96.7°F | Ht 62.0 in | Wt 109.0 lb

## 2013-02-14 DIAGNOSIS — E785 Hyperlipidemia, unspecified: Secondary | ICD-10-CM

## 2013-02-14 DIAGNOSIS — F411 Generalized anxiety disorder: Secondary | ICD-10-CM

## 2013-02-14 DIAGNOSIS — E039 Hypothyroidism, unspecified: Secondary | ICD-10-CM | POA: Insufficient documentation

## 2013-02-14 DIAGNOSIS — I10 Essential (primary) hypertension: Secondary | ICD-10-CM

## 2013-02-14 DIAGNOSIS — E559 Vitamin D deficiency, unspecified: Secondary | ICD-10-CM

## 2013-02-14 DIAGNOSIS — K219 Gastro-esophageal reflux disease without esophagitis: Secondary | ICD-10-CM

## 2013-02-14 LAB — POCT CBC
HCT, POC: 38.1 % (ref 37.7–47.9)
MCH, POC: 30.7 pg (ref 27–31.2)
MCHC: 33.6 g/dL (ref 31.8–35.4)
POC Granulocyte: 4.7 (ref 2–6.9)
POC LYMPH PERCENT: 28.4 %L (ref 10–50)
RDW, POC: 12.9 %
WBC: 7 10*3/uL (ref 4.6–10.2)

## 2013-02-14 LAB — BASIC METABOLIC PANEL WITH GFR
BUN: 13 mg/dL (ref 6–23)
CO2: 28 mEq/L (ref 19–32)
Calcium: 9.5 mg/dL (ref 8.4–10.5)
Creat: 0.94 mg/dL (ref 0.50–1.10)
GFR, Est African American: 70 mL/min
Glucose, Bld: 81 mg/dL (ref 70–99)

## 2013-02-14 LAB — THYROID PANEL WITH TSH
Free Thyroxine Index: 3.5 (ref 1.0–3.9)
T4, Total: 11.6 ug/dL (ref 5.0–12.5)
TSH: 0.518 u[IU]/mL (ref 0.350–4.500)

## 2013-02-14 LAB — HEPATIC FUNCTION PANEL
ALT: 9 U/L (ref 0–35)
AST: 18 U/L (ref 0–37)
Albumin: 3.8 g/dL (ref 3.5–5.2)
Alkaline Phosphatase: 71 U/L (ref 39–117)
Total Bilirubin: 0.6 mg/dL (ref 0.3–1.2)
Total Protein: 6.7 g/dL (ref 6.0–8.3)

## 2013-02-14 MED ORDER — OXAZEPAM 15 MG PO CAPS: 15.0000 mg | ORAL_CAPSULE | Freq: Three times a day (TID) | ORAL | Status: DC

## 2013-02-14 NOTE — Patient Instructions (Addendum)
Fall precautions discussed Continue current meds and therapeutic lifestyle changes  try Nasacort AQ over-the-counter 1-2 sprays each nostril daily

## 2013-02-14 NOTE — Progress Notes (Signed)
  Subjective:    Patient ID: [name], female    DOB: [birth year], 72 y.o.   MRN: [id number]  HPI The biggest problem for the patient today is her reaction to pollen.   Review of Systems  Constitutional: Negative.   HENT: Positive for postnasal drip.   Eyes: Negative.   Respiratory: Negative.   Cardiovascular: Negative.   Gastrointestinal: Negative.   Endocrine: Negative.   Genitourinary: Negative.   Musculoskeletal: Negative.   Skin: Negative.   Allergic/Immunologic: Negative.   Neurological: Negative.   Hematological: Negative.   Psychiatric/Behavioral: Negative.        Objective:   Physical Exam BP 99/70  Pulse 75  Temp(Src) 96.7 F (35.9 C) (Oral)  Ht 5\' 2"  (1.575 m)  Wt 109 lb (49.442 kg)  BMI 19.93 kg/m2  The patient appeared well nourished and normally developed, alert and oriented to time and place. Speech, behavior and judgement appear normal. Vital signs as documented.  Head exam is unremarkable. No scleral icterus or pallor noted. She is edentulous. Mouth is okay. Neck is without jugular venous distension, thyromegally, or carotid bruits. Carotid upstrokes are brisk bilaterally. No cervical adenopathy. Lungs are clear anteriorly and posteriorly to auscultation. Normal respiratory effort. Breast exam was normal today. Cardiac exam reveals regular rate and rhythm at 60 per minute. First and second heart sounds normal. No murmurs, rubs or gallops.  Abdominal exam reveals , no masses, no organomegaly and no aortic enlargement. No inguinal adenopathy. Extremities are nonedematous and both femoral  pulses are normal. She has kyphosis. Skin without pallor or jaundice.  Warm and dry, without rash. Neurologic exam reveals normal deep tendon reflexes and normal sensation.          Assessment & Plan:  1. Essential hypertension, benign - BASIC METABOLIC PANEL WITH GFR; Standing - BASIC METABOLIC PANEL WITH GFR  2. Hyperlipemia - Hepatic function panel;  Standing - NMR Lipoprofile with Lipids; Standing - Hepatic function panel - NMR Lipoprofile with Lipids  3. GERD (gastroesophageal reflux disease) - POCT CBC; Standing - POCT CBC  4. Hypothyroid - Thyroid Panel With TSH  5. Vitamin D deficiency - Vitamin D 25 hydroxy; Standing - Vitamin D 25 hydroxy  6. Generalized anxiety disorder - oxazepam (SERAX) 15 MG capsule; Take 1 capsule (15 mg total) by mouth 3 (three) times daily.  Dispense: 90 capsule; Refill: 5    Patient Instructions  Fall precautions discussed Continue current meds and therapeutic lifestyle changes

## 2013-02-15 LAB — NMR LIPOPROFILE WITH LIPIDS
HDL Size: 9.1 nm — ABNORMAL LOW (ref >=9.2)
LDL Particle Number: 846 nmol/L (ref <1000)
LDL Size: 20.4 nm — ABNORMAL LOW (ref >20.5)
Large HDL-P: 5.2 umol/L (ref >=4.8)
Large VLDL-P: 1.2 nmol/L (ref <=2.7)
Small LDL Particle Number: 525 nmol/L (ref <=527)
Triglycerides: 95 mg/dL (ref <150)
VLDL Size: 44.9 nm (ref <=46.6)

## 2013-02-28 ENCOUNTER — Other Ambulatory Visit: Payer: Self-pay | Admitting: *Deleted

## 2013-02-28 MED ORDER — OMEPRAZOLE 40 MG PO CPDR: 40.0000 mg | DELAYED_RELEASE_CAPSULE | Freq: Every day | ORAL | Status: DC

## 2013-02-28 MED ORDER — LEVOTHYROXINE SODIUM 100 MCG PO TABS: 100.0000 ug | ORAL_TABLET | Freq: Every day | ORAL | Status: DC

## 2013-03-05 ENCOUNTER — Telehealth: Payer: Self-pay | Admitting: Family Medicine

## 2013-03-05 ENCOUNTER — Ambulatory Visit (INDEPENDENT_AMBULATORY_CARE_PROVIDER_SITE_OTHER): Payer: Medicare HMO | Admitting: General Practice

## 2013-03-05 ENCOUNTER — Encounter: Payer: Self-pay | Admitting: General Practice

## 2013-03-05 VITALS — BP 114/80 | HR 102 | Temp 99.2°F | Ht 62.0 in | Wt 107.0 lb

## 2013-03-05 DIAGNOSIS — R1011 Right upper quadrant pain: Secondary | ICD-10-CM

## 2013-03-05 NOTE — Progress Notes (Signed)
  Subjective:    Patient ID: [name], female    DOB: May 11, 1941, 72 y.o.   MRN: [id number]  HPI Presents today with right side abdominal  pain. Onset was last weekend and was seen at urgent care last Tuesday. Reports having large amount of stool in colon. Reports being constipated prior to visiting urgent care. Reports being prescribed hyoscyamine sulfate. OTC miralax. Reports bowel movements twice daily and normal color (brown) and soft. Reports right is aggrevated by moving or laying in certain positions. Reports pain score as 4 on 1-10 scales. Reports having abdominal surgery many years ago and unsure if appendix was removed.     Review of Systems  Constitutional: Negative for fever, chills and appetite change.  Respiratory: Negative for chest tightness and shortness of breath.   Cardiovascular: Negative for chest pain.  Gastrointestinal: Positive for abdominal pain. Negative for nausea, constipation, blood in stool and abdominal distention.  Genitourinary: Negative for flank pain, difficulty urinating and pelvic pain.  Musculoskeletal: Negative for back pain.  Skin: Negative.   Neurological: Negative for dizziness and headaches.  Psychiatric/Behavioral: Negative.        Objective:   Physical Exam  Constitutional: She is oriented to person, place, and time. She appears well-developed and well-nourished.  Cardiovascular: Normal rate, regular rhythm and normal heart sounds.   Pulmonary/Chest: Effort normal and breath sounds normal. No respiratory distress. She exhibits no tenderness.  Abdominal: Bowel sounds are normal. There is tenderness. There is tenderness at McBurney's point and positive Murphy's sign.  Neurological: She is alert and oriented to person, place, and time.  Skin: Skin is warm and dry.  Psychiatric: She has a normal mood and affect.          Assessment & Plan:  1. Abdominal pain, right upper quadrant -Advised patient to visit emergency room of choice for  evaluation (gall bladder and appendix) -Patient verbalized understanding and agreed to go if her pain worsened tonight. Otherwise she would go tomorrow

## 2013-03-05 NOTE — Telephone Encounter (Signed)
APT MADE 

## 2013-03-05 NOTE — Patient Instructions (Signed)
Cholecystitis  Cholecystitis is an inflammation of your gallbladder. It is usually caused by a buildup of gallstones or sludge (cholelithiasis) in your gallbladder. The gallbladder stores a fluid that helps digest fats (bile). Cholecystitis is serious and needs treatment right away.   CAUSES    Gallstones. Gallstones can block the tube that leads to your gallbladder, causing bile to build up. As bile builds up, the gallbladder becomes inflamed.   Bile duct problems, such as blockage from scarring or kinking.   Tumors. Tumors can stop bile from leaving your gallbladder correctly, causing bile to build up. As bile builds up, the gallbladder becomes inflamed.  SYMPTOMS    Nausea.   Vomiting.   Abdominal pain, especially in the upper right area of your abdomen.   Abdominal tenderness or bloating.   Sweating.   Chills.   Fever.   Yellowing of the skin and the whites of the eyes (jaundice).  DIAGNOSIS   Your caregiver may order blood tests to look for infection or gallbladder problems. Your caregiver may also order imaging tests, such as an ultrasound or computed tomography (CT) scan. Further tests may include a hepatobiliary iminodiacetic acid (HIDA) scan. This scan allows your caregiver to see your bile move from the liver to the gallbladder and to the small intestine.  TREATMENT   A hospital stay is usually necessary to lessen the inflammation of your gallbladder. You may be required to not eat or drink (fast) for a certain amount of time. You may be given medicine to treat pain or an antibiotic medicine to treat an infection. Surgery may be needed to remove your gallbladder (cholecystectomy) once the inflammation has gone down. Surgery may be needed right away if you develop complications such as death of gallbladder tissue (gangrene) or a tear (perforation) of the gallbladder.   HOME CARE INSTRUCTIONS   Home care will depend on your treatment. In general:   If you were given antibiotics, take them as  directed. Finish them even if you start to feel better.   Only take over-the-counter or prescription medicines for pain, discomfort, or fever as directed by your caregiver.   Follow a low-fat diet until you see your caregiver again.   Keep all follow-up visits as directed by your caregiver.  SEEK IMMEDIATE MEDICAL CARE IF:    Your pain is increasing and not controlled by medicines.   Your pain moves to another part of your abdomen or to your back.   You have a fever.   You have nausea and vomiting.  MAKE SURE YOU:   Understand these instructions.   Will watch your condition.   Will get help right away if you are not doing well or get worse.  Document Released: 10/04/2005 Document Revised: 12/27/2011 Document Reviewed: 08/20/2011  Exit[employer] Patient Information 2013 [employer].

## 2013-03-07 ENCOUNTER — Telehealth: Payer: Self-pay | Admitting: *Deleted

## 2013-03-07 NOTE — Telephone Encounter (Signed)
She went back to hospital with her abdominal pain after seeing [place]. She was given IV fluids and had a scan done.  They suggested she see a GI doctor.  She plans to schedule with Dr. Ewing Schlein since she has seen him before.  [place] aware of this.

## 2013-03-13 ENCOUNTER — Telehealth: Payer: Self-pay | Admitting: Pharmacist

## 2013-03-13 NOTE — Telephone Encounter (Signed)
I had contacted patient's insurance to determine cost of Prolia and get prior auth.  PA was approved on 02/20/13 through 02/21/15 but patient would need to get through pharmacy.  Called [facility] to find out copay info.  Copay will be $95 - but patient say this is still too expensive and she cannot afford.   Patient cannot take oral bisphosphonate due to swallowing difficulties.  Prolia too expensive.  Miacalcin caused nasal irritation.   Called [employer] to find out cost of Reclast - awaiting call back with info

## 2013-03-26 NOTE — Telephone Encounter (Signed)
Reclast was denied because of prior auth on file for prolia.  Will file an appeal to let [employer] know we are not using Prolia because of cost.

## 2013-03-28 ENCOUNTER — Other Ambulatory Visit: Payer: Self-pay | Admitting: *Deleted

## 2013-03-28 MED ORDER — OMEPRAZOLE 20 MG PO CPDR: 20.0000 mg | DELAYED_RELEASE_CAPSULE | Freq: Every day | ORAL | Status: DC

## 2013-07-02 ENCOUNTER — Ambulatory Visit (INDEPENDENT_AMBULATORY_CARE_PROVIDER_SITE_OTHER): Payer: Medicare HMO | Admitting: Family Medicine

## 2013-07-02 ENCOUNTER — Encounter: Payer: Self-pay | Admitting: Family Medicine

## 2013-07-02 VITALS — BP 100/67 | HR 87 | Temp 98.6°F | Ht 62.0 in | Wt 110.0 lb

## 2013-07-02 DIAGNOSIS — E785 Hyperlipidemia, unspecified: Secondary | ICD-10-CM

## 2013-07-02 DIAGNOSIS — E559 Vitamin D deficiency, unspecified: Secondary | ICD-10-CM

## 2013-07-02 DIAGNOSIS — I10 Essential (primary) hypertension: Secondary | ICD-10-CM

## 2013-07-02 DIAGNOSIS — R350 Frequency of micturition: Secondary | ICD-10-CM

## 2013-07-02 DIAGNOSIS — E039 Hypothyroidism, unspecified: Secondary | ICD-10-CM

## 2013-07-02 DIAGNOSIS — K219 Gastro-esophageal reflux disease without esophagitis: Secondary | ICD-10-CM

## 2013-07-02 DIAGNOSIS — R300 Dysuria: Secondary | ICD-10-CM

## 2013-07-02 DIAGNOSIS — F411 Generalized anxiety disorder: Secondary | ICD-10-CM

## 2013-07-02 LAB — POCT UA - MICROSCOPIC ONLY
Bacteria, U Microscopic: NEGATIVE
Casts, Ur, LPF, POC: NEGATIVE

## 2013-07-02 LAB — POCT URINALYSIS DIPSTICK
Bilirubin, UA: NEGATIVE
Glucose, UA: NEGATIVE
Nitrite, UA: NEGATIVE
Spec Grav, UA: 1.01
Urobilinogen, UA: NEGATIVE

## 2013-07-02 MED ORDER — NITROFURANTOIN MONOHYD MACRO 100 MG PO CAPS: 100.0000 mg | ORAL_CAPSULE | Freq: Two times a day (BID) | ORAL | Status: DC

## 2013-07-02 MED ORDER — PHENAZOPYRIDINE HCL 200 MG PO TABS: 200.0000 mg | ORAL_TABLET | Freq: Three times a day (TID) | ORAL | Status: DC | PRN

## 2013-07-02 NOTE — Progress Notes (Signed)
  Subjective:    Patient ID: [name], female    DOB: Jun 24, 1941, 72 y.o.   MRN: [id number]  HPI The patient returns to clinic today for followup of chronic medical problems. These include hypertension hyperlipidemia, hypothyroidism, GERD, general anxiety disorder, and vitamin D deficiency. She is also having some allergy issues with drainage and urinary tract symptoms x3-4 weeks.   Review of Systems  Constitutional: Positive for fatigue. Negative for activity change and appetite change.  HENT: Positive for postnasal drip. Negative for ear pain, congestion, sore throat and sinus pressure.   Eyes: Positive for itching. Negative for discharge, redness and visual disturbance.  Respiratory: Negative for cough, chest tightness, shortness of breath and wheezing.   Cardiovascular: Positive for leg swelling. Negative for chest pain and palpitations.  Gastrointestinal: Negative for nausea, vomiting, abdominal pain, diarrhea, constipation and blood in stool.  Endocrine: Negative.  Negative for cold intolerance, heat intolerance, polydipsia and polyphagia.  Genitourinary: Positive for dysuria, urgency and frequency.  Musculoskeletal: Negative for myalgias, back pain and arthralgias.  Skin: Negative.  Negative for color change, pallor, rash and wound.  Allergic/Immunologic: Negative.  Negative for environmental allergies and food allergies.  Neurological: Positive for headaches (occasional). Negative for dizziness, tremors and weakness.  Psychiatric/Behavioral: Positive for sleep disturbance (occasional). Negative for confusion and decreased concentration. The patient is not nervous/anxious.        Objective:   Physical Exam BP 100/67  Pulse 87  Temp(Src) 98.6 F (37 C) (Oral)  Ht 5\' 2"  (1.575 m)  Wt 110 lb (49.896 kg)  BMI 20.11 kg/m2  The patient appeared well nourished and normally developed, alert and oriented to time and place. Speech, behavior and judgement appear normal. Vital  signs as documented.  Head exam is unremarkable. No scleral icterus or pallor noted. Ears nose throat and mouth are all normal. She does wear dentures.  Neck is without jugular venous distension, thyromegally, or carotid bruits. Carotid upstrokes are brisk bilaterally. No cervical adenopathy. Lungs are clear anteriorly and posteriorly to auscultation. Normal respiratory effort. Cardiac exam reveals regular rate and rhythm at 72 per minute. First and second heart sounds normal.  No murmurs, rubs or gallops.  Abdominal exam reveals normal bowl sounds, no masses, no organomegaly and no aortic enlargement. No inguinal adenopathy. There is no suprapubic tenderness. Extremities are nonedematous and both femoral and pedal pulses are normal. Musculoskeletal intact Skin without pallor or jaundice.  Warm and dry, without rash. Neurologic exam reveals normal deep tendon reflexes and normal sensation.  Urinalysis is pending        Assessment & Plan:  1. Dysuria - POCT UA - Microscopic Only - POCT urinalysis dipstick - Urine culture  2. Urinary frequency - POCT UA - Microscopic Only - POCT urinalysis dipstick - Urine culture  3. Hypertension  4. Hyperlipemia  5. GERD (gastroesophageal reflux disease)  6. Hypothyroid  7. Vitamin D deficiency  8. Generalized anxiety disorder  Patient Instructions  Fall precautions discussed Continue current meds and therapeutic lifestyle changes Return to clinic in October for a flu shot   Nyra Capes MD

## 2013-07-02 NOTE — Patient Instructions (Addendum)
Fall precautions discussed Continue current meds and therapeutic lifestyle changes Return to clinic in October for a flu shot 

## 2013-07-04 LAB — URINE CULTURE: Organism ID, Bacteria: NO GROWTH

## 2013-07-20 ENCOUNTER — Other Ambulatory Visit: Payer: Self-pay | Admitting: *Deleted

## 2013-07-20 MED ORDER — HYDROCHLOROTHIAZIDE 25 MG PO TABS: 25.0000 mg | ORAL_TABLET | Freq: Every day | ORAL | Status: DC

## 2013-07-20 MED ORDER — POTASSIUM CHLORIDE 20 MEQ/15ML (10%) PO SOLN: ORAL | Status: DC

## 2013-07-20 MED ORDER — ATORVASTATIN CALCIUM 80 MG PO TABS: 80.0000 mg | ORAL_TABLET | Freq: Every day | ORAL | Status: DC

## 2013-07-23 ENCOUNTER — Other Ambulatory Visit (INDEPENDENT_AMBULATORY_CARE_PROVIDER_SITE_OTHER): Payer: Medicare HMO

## 2013-07-23 DIAGNOSIS — Z23 Encounter for immunization: Secondary | ICD-10-CM

## 2013-07-23 DIAGNOSIS — E785 Hyperlipidemia, unspecified: Secondary | ICD-10-CM

## 2013-07-23 DIAGNOSIS — E559 Vitamin D deficiency, unspecified: Secondary | ICD-10-CM

## 2013-07-23 DIAGNOSIS — K219 Gastro-esophageal reflux disease without esophagitis: Secondary | ICD-10-CM

## 2013-07-23 DIAGNOSIS — I10 Essential (primary) hypertension: Secondary | ICD-10-CM

## 2013-07-23 LAB — BASIC METABOLIC PANEL WITH GFR
BUN: 17 mg/dL (ref 6–23)
Chloride: 102 mEq/L (ref 96–112)
Glucose, Bld: 97 mg/dL (ref 70–99)
Potassium: 4.3 mEq/L (ref 3.5–5.3)
Sodium: 142 mEq/L (ref 135–145)

## 2013-07-23 LAB — HEPATIC FUNCTION PANEL
ALT: 10 U/L (ref 0–35)
AST: 18 U/L (ref 0–37)
Alkaline Phosphatase: 83 U/L (ref 39–117)
Bilirubin, Direct: 0.1 mg/dL (ref 0.0–0.3)
Indirect Bilirubin: 0.5 mg/dL (ref 0.0–0.9)

## 2013-07-23 LAB — POCT CBC
Granulocyte percent: 63.6 %G (ref 37–80)
HCT, POC: 38.3 % (ref 37.7–47.9)
MCV: 89.1 fL (ref 80–97)
RBC: 4.3 M/uL (ref 4.04–5.48)

## 2013-07-23 NOTE — Progress Notes (Signed)
Patient came in for labs only.

## 2013-07-24 LAB — NMR LIPOPROFILE WITH LIPIDS
Cholesterol, Total: 122 mg/dL (ref <200)
HDL Particle Number: 34.7 umol/L (ref >=30.5)
LDL Particle Number: 900 nmol/L (ref <1000)
LDL Size: 20.4 nm — ABNORMAL LOW (ref >20.5)
Large HDL-P: 4 umol/L — ABNORMAL LOW (ref >=4.8)
Large VLDL-P: 1.5 nmol/L (ref <=2.7)
Small LDL Particle Number: 475 nmol/L (ref <=527)
VLDL Size: 45.2 nm (ref <=46.6)

## 2013-08-21 ENCOUNTER — Other Ambulatory Visit: Payer: Self-pay

## 2013-08-21 DIAGNOSIS — F411 Generalized anxiety disorder: Secondary | ICD-10-CM

## 2013-08-21 MED ORDER — OXAZEPAM 15 MG PO CAPS: 15.0000 mg | ORAL_CAPSULE | Freq: Three times a day (TID) | ORAL | Status: DC

## 2013-08-21 NOTE — Telephone Encounter (Signed)
Last seen 07/02/13  DWM If approved route to nurse to phone into [facility]

## 2013-08-21 NOTE — Telephone Encounter (Signed)
This prescription is okay for one month with 2 refills

## 2013-08-22 NOTE — Telephone Encounter (Signed)
Phoned in med 

## 2013-11-05 ENCOUNTER — Ambulatory Visit (INDEPENDENT_AMBULATORY_CARE_PROVIDER_SITE_OTHER): Payer: Medicare HMO

## 2013-11-05 ENCOUNTER — Ambulatory Visit (INDEPENDENT_AMBULATORY_CARE_PROVIDER_SITE_OTHER): Payer: Medicare HMO | Admitting: Family Medicine

## 2013-11-05 ENCOUNTER — Encounter: Payer: Self-pay | Admitting: Family Medicine

## 2013-11-05 VITALS — BP 106/70 | HR 100 | Temp 98.3°F | Ht 62.0 in | Wt 111.0 lb

## 2013-11-05 DIAGNOSIS — Z23 Encounter for immunization: Secondary | ICD-10-CM

## 2013-11-05 DIAGNOSIS — R071 Chest pain on breathing: Secondary | ICD-10-CM

## 2013-11-05 DIAGNOSIS — E039 Hypothyroidism, unspecified: Secondary | ICD-10-CM

## 2013-11-05 DIAGNOSIS — E559 Vitamin D deficiency, unspecified: Secondary | ICD-10-CM

## 2013-11-05 DIAGNOSIS — R0789 Other chest pain: Secondary | ICD-10-CM

## 2013-11-05 DIAGNOSIS — E785 Hyperlipidemia, unspecified: Secondary | ICD-10-CM

## 2013-11-05 DIAGNOSIS — K219 Gastro-esophageal reflux disease without esophagitis: Secondary | ICD-10-CM

## 2013-11-05 NOTE — Progress Notes (Signed)
Subjective:    Patient ID: [name], female    DOB: 25-Nov-1940, 73 y.o.   MRN: [id number]  HPI Pt here for follow up and management of chronic medical problems. Patient complains of pain in the right rib area. This has been known for about 2 weeks it may or may not be any better at this time. Patient is due for her Prevnar today and an FOBT. She will return to the clinic for future lab work.        Patient Active Problem List   Diagnosis Date Noted  . Hyperlipemia 02/14/2013  . Hypothyroid 02/14/2013  . Vitamin D deficiency 02/14/2013  . Generalized anxiety disorder 02/14/2013  . Constipation   . Esophageal stricture   . Osteoporosis   . GERD (gastroesophageal reflux disease)    Outpatient Encounter Prescriptions as of 11/05/2013  Medication Sig  . atorvastatin (LIPITOR) 80 MG tablet Take 1 tablet (80 mg total) by mouth daily.  . Cholecalciferol (VITAMIN D3) 5000 UNITS TABS Take 1 tablet by mouth daily.    . hydrochlorothiazide (HYDRODIURIL) 25 MG tablet Take 1 tablet (25 mg total) by mouth daily.  Marland Kitchen levothyroxine (SYNTHROID, LEVOTHROID) 100 MCG tablet Take 1 tablet (100 mcg total) by mouth daily before breakfast. 1/2 tab qd except 1 on m and f  . omeprazole (PRILOSEC) 20 MG capsule Take 1 capsule (20 mg total) by mouth daily.  Marland Kitchen oxazepam (SERAX) 15 MG capsule Take 1 capsule (15 mg total) by mouth 3 (three) times daily.  . potassium chloride 20 MEQ/15ML (10%) SOLN Take 1 and 1/2 tsp. qd  . triamcinolone cream (KENALOG) 0.1 %   . [DISCONTINUED] hyoscyamine (ANASPAZ) 0.125 MG TBDP Take by mouth every 8 (eight) hours as needed.  . [DISCONTINUED] nitrofurantoin, macrocrystal-monohydrate, (MACROBID) 100 MG capsule Take 1 capsule (100 mg total) by mouth 2 (two) times daily.  . [DISCONTINUED] phenazopyridine (PYRIDIUM) 200 MG tablet Take 1 tablet (200 mg total) by mouth 3 (three) times daily as needed for pain.    Review of Systems  Constitutional: Negative.   HENT:  Negative.   Eyes: Negative.   Respiratory: Negative.   Cardiovascular: Negative.   Gastrointestinal: Negative.   Endocrine: Negative.   Genitourinary: Negative.   Musculoskeletal: Positive for myalgias (pain in right rib area).  Skin: Negative.   Allergic/Immunologic: Negative.   Neurological: Negative.   Hematological: Negative.   Psychiatric/Behavioral: Negative.        Objective:   Physical Exam  Nursing note and vitals reviewed. Constitutional: She is oriented to person, place, and time. She appears well-developed and well-nourished.  For her age  HENT:  Head: Normocephalic and atraumatic.  Right Ear: External ear normal.  Left Ear: External ear normal.  Nose: Nose normal.  Mouth/Throat: Oropharynx is clear and moist.  Upper dentures in place  Eyes: Conjunctivae and EOM are normal. Pupils are equal, round, and reactive to light. Right eye exhibits no discharge. Left eye exhibits no discharge. No scleral icterus.  Neck: Normal range of motion. Neck supple. No JVD present. No thyromegaly present.  Cardiovascular: Normal rate, regular rhythm, normal heart sounds and intact distal pulses.  Exam reveals no gallop and no friction rub.   No murmur heard. At 96 per minute  Pulmonary/Chest: Effort normal and breath sounds normal. No respiratory distress. She has no wheezes. She has no rales. She exhibits no tenderness.  Abdominal: Soft. Bowel sounds are normal. She exhibits no mass. There is no tenderness. There is no rebound and  no guarding.  Musculoskeletal: Normal range of motion. She exhibits no edema and no tenderness.  Kyphosis, right lateral chest and tenderness with no rash  Lymphadenopathy:    She has no cervical adenopathy.  Neurological: She is alert and oriented to person, place, and time. She has normal reflexes. No cranial nerve deficit.  Skin: Skin is warm and dry. No rash noted.  Psychiatric: She has a normal mood and affect. Her behavior is normal. Judgment and  thought content normal.   BP 106/70  Pulse 100  Temp(Src) 98.3 F (36.8 C) (Oral)  Ht _0  (1.575 m)  Wt 111 lb (50.349 kg)  BMI 20.30 kg/m2  [facility] reading (PRIMARY) by  Dr. 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 GERD (gastroesophageal reflux disease) - POCT CBC; Future  2. Hyperlipemia - POCT CBC; Future - BMP8+EGFR; Future - Hepatic function panel; Future - NMR, lipoprofile; Future  3. Hypothyroid - POCT CBC; Future - Thyroid Panel With TSH; Future  4. Vitamin D deficiency - POCT CBC; Future - Vit D  25 hydroxy (rtn osteoporosis monitoring); Future  5. Chest wall pain -Right rib detail  Patient Instructions  Continue current medications. Continue good therapeutic lifestyle changes which include good diet and exercise. Fall precautions discussed with patient. Schedule your flu vaccine if you haven't had it yet If you are over 13 years old - you may need Prevnar 33 or the adult Pneumonia vaccine. Use warm wet compresses to right chest 20 minutes 3 or 4 times daily   Arrie Senate MD

## 2013-11-05 NOTE — Patient Instructions (Addendum)
Continue current medications. Continue good therapeutic lifestyle changes which include good diet and exercise. Fall precautions discussed with patient. Schedule your flu vaccine if you haven't had it yet If you are over 75 years old - you may need Prevnar 15 or the adult Pneumonia vaccine. Use warm wet compresses to right chest 20 minutes 3 or 4 times daily

## 2013-11-05 NOTE — Addendum Note (Signed)
Addended by: Zannie Cove on: 11/05/2013 04:35 PM   Modules accepted: Orders

## 2013-11-06 ENCOUNTER — Other Ambulatory Visit (INDEPENDENT_AMBULATORY_CARE_PROVIDER_SITE_OTHER): Payer: Medicare HMO

## 2013-11-06 DIAGNOSIS — E039 Hypothyroidism, unspecified: Secondary | ICD-10-CM

## 2013-11-06 DIAGNOSIS — E785 Hyperlipidemia, unspecified: Secondary | ICD-10-CM

## 2013-11-06 DIAGNOSIS — E559 Vitamin D deficiency, unspecified: Secondary | ICD-10-CM

## 2013-11-06 DIAGNOSIS — K219 Gastro-esophageal reflux disease without esophagitis: Secondary | ICD-10-CM

## 2013-11-06 NOTE — Addendum Note (Signed)
Addended by: Pollyann Kennedy F on: 11/06/2013 11:08 AM   Modules accepted: Orders

## 2013-11-06 NOTE — Progress Notes (Signed)
Pt came in for labs only 

## 2013-11-07 LAB — CBC WITH DIFFERENTIAL
BASOS ABS: 0 10*3/uL (ref 0.0–0.2)
Basos: 1 %
Eos: 3 %
Eosinophils Absolute: 0.2 10*3/uL (ref 0.0–0.4)
HEMATOCRIT: 37.4 % (ref 34.0–46.6)
Hemoglobin: 12.5 g/dL (ref 11.1–15.9)
Immature Grans (Abs): 0 10*3/uL (ref 0.0–0.1)
Immature Granulocytes: 0 %
LYMPHS ABS: 2 10*3/uL (ref 0.7–3.1)
Lymphs: 32 %
MCH: 30.2 pg (ref 26.6–33.0)
MCHC: 33.4 g/dL (ref 31.5–35.7)
MCV: 90 fL (ref 79–97)
MONOS ABS: 0.5 10*3/uL (ref 0.1–0.9)
Monocytes: 8 %
Neutrophils Absolute: 3.5 10*3/uL (ref 1.4–7.0)
Neutrophils Relative %: 56 %
PLATELETS: 246 10*3/uL (ref 150–379)
RBC: 4.14 x10E6/uL (ref 3.77–5.28)
RDW: 14 % (ref 12.3–15.4)
WBC: 6.2 10*3/uL (ref 3.4–10.8)

## 2013-11-08 LAB — NMR, LIPOPROFILE
Cholesterol: 146 mg/dL (ref <200)
HDL Cholesterol by NMR: 48 mg/dL (ref >=40)
HDL Particle Number: 33.2 umol/L (ref >=30.5)
LDL PARTICLE NUMBER: 1037 nmol/L — AB (ref <1000)
LDL SIZE: 20.8 nm (ref >20.5)
LDLC SERPL CALC-MCNC: 73 mg/dL (ref <100)
LP-IR SCORE: 42 (ref <=45)
SMALL LDL PARTICLE NUMBER: 534 nmol/L — AB (ref <=527)
Triglycerides by NMR: 123 mg/dL (ref <150)

## 2013-11-08 LAB — BMP8+EGFR
BUN / CREAT RATIO: 16 (ref 11–26)
BUN: 17 mg/dL (ref 8–27)
CALCIUM: 9.5 mg/dL (ref 8.7–10.3)
CO2: 27 mmol/L (ref 18–29)
CREATININE: 1.09 mg/dL — AB (ref 0.57–1.00)
Chloride: 99 mmol/L (ref 97–108)
GFR calc Af Amer: 58 mL/min/{1.73_m2} — ABNORMAL LOW (ref >59)
GFR, EST NON AFRICAN AMERICAN: 50 mL/min/{1.73_m2} — AB (ref >59)
Glucose: 87 mg/dL (ref 65–99)
Potassium: 3.7 mmol/L (ref 3.5–5.2)
SODIUM: 143 mmol/L (ref 134–144)

## 2013-11-08 LAB — VITAMIN D 25 HYDROXY (VIT D DEFICIENCY, FRACTURES): Vit D, 25-Hydroxy: 29.7 ng/mL — ABNORMAL LOW (ref 30.0–100.0)

## 2013-11-08 LAB — HEPATIC FUNCTION PANEL
ALK PHOS: 91 IU/L (ref 39–117)
ALT: 8 IU/L (ref 0–32)
AST: 16 IU/L (ref 0–40)
Albumin: 4.1 g/dL (ref 3.5–4.8)
BILIRUBIN DIRECT: 0.16 mg/dL (ref 0.00–0.40)
BILIRUBIN TOTAL: 0.6 mg/dL (ref 0.0–1.2)
Total Protein: 6.8 g/dL (ref 6.0–8.5)

## 2013-11-08 LAB — THYROID PANEL WITH TSH
Free Thyroxine Index: 2.7 (ref 1.2–4.9)
T3 UPTAKE RATIO: 28 % (ref 24–39)
T4 TOTAL: 9.7 ug/dL (ref 4.5–12.0)
TSH: 0.4 u[IU]/mL — AB (ref 0.450–4.500)

## 2013-12-20 ENCOUNTER — Other Ambulatory Visit: Payer: Self-pay

## 2013-12-20 DIAGNOSIS — F411 Generalized anxiety disorder: Secondary | ICD-10-CM

## 2013-12-20 MED ORDER — OXAZEPAM 15 MG PO CAPS: 15.0000 mg | ORAL_CAPSULE | Freq: Three times a day (TID) | ORAL | Status: DC

## 2013-12-20 MED ORDER — HYDROCHLOROTHIAZIDE 25 MG PO TABS: 25.0000 mg | ORAL_TABLET | Freq: Every day | ORAL | Status: DC

## 2013-12-20 MED ORDER — LEVOTHYROXINE SODIUM 100 MCG PO TABS: 100.0000 ug | ORAL_TABLET | Freq: Every day | ORAL | Status: DC

## 2013-12-20 MED ORDER — ATORVASTATIN CALCIUM 80 MG PO TABS: 80.0000 mg | ORAL_TABLET | Freq: Every day | ORAL | Status: DC

## 2013-12-20 MED ORDER — OMEPRAZOLE 20 MG PO CPDR: 20.0000 mg | DELAYED_RELEASE_CAPSULE | Freq: Every day | ORAL | Status: DC

## 2013-12-20 NOTE — Telephone Encounter (Signed)
Last seen 11/05/13 DWM    This is mail order  Print Oxazepam and route to nurse to call patient

## 2013-12-25 ENCOUNTER — Other Ambulatory Visit: Payer: Self-pay

## 2013-12-25 MED ORDER — LEVOTHYROXINE SODIUM 100 MCG PO TABS: ORAL_TABLET | ORAL | Status: DC

## 2014-03-05 ENCOUNTER — Encounter: Payer: Self-pay | Admitting: Family Medicine

## 2014-03-05 ENCOUNTER — Ambulatory Visit (INDEPENDENT_AMBULATORY_CARE_PROVIDER_SITE_OTHER): Payer: Medicare HMO | Admitting: Family Medicine

## 2014-03-05 ENCOUNTER — Other Ambulatory Visit: Payer: Self-pay | Admitting: Family Medicine

## 2014-03-05 VITALS — BP 119/76 | HR 89 | Temp 99.0°F | Ht 62.0 in | Wt 109.0 lb

## 2014-03-05 DIAGNOSIS — K219 Gastro-esophageal reflux disease without esophagitis: Secondary | ICD-10-CM

## 2014-03-05 DIAGNOSIS — E559 Vitamin D deficiency, unspecified: Secondary | ICD-10-CM

## 2014-03-05 DIAGNOSIS — E785 Hyperlipidemia, unspecified: Secondary | ICD-10-CM

## 2014-03-05 DIAGNOSIS — E039 Hypothyroidism, unspecified: Secondary | ICD-10-CM

## 2014-03-05 LAB — POCT CBC
Granulocyte percent: 68.2 %G (ref 37–80)
HCT, POC: 40.6 % (ref 37.7–47.9)
HEMOGLOBIN: 12.9 g/dL (ref 12.2–16.2)
Lymph, poc: 2.4 (ref 0.6–3.4)
MCH: 28.9 pg (ref 27–31.2)
MCHC: 31.8 g/dL (ref 31.8–35.4)
MCV: 90.9 fL (ref 80–97)
MPV: 8.1 fL (ref 0–99.8)
POC Granulocyte: 5.5 (ref 2–6.9)
POC LYMPH %: 29.7 % (ref 10–50)
Platelet Count, POC: 243 10*3/uL (ref 142–424)
RBC: 4.5 M/uL (ref 4.04–5.48)
RDW, POC: 13 %
WBC: 8.1 10*3/uL (ref 4.6–10.2)

## 2014-03-05 NOTE — Patient Instructions (Addendum)
Medicare Annual Wellness Visit  [facility] and the medical providers at [facility] strive to bring you the best medical care.  In doing so we not only want to address your current medical conditions and concerns but also to detect new conditions early and prevent illness, disease and health-related problems.    Medicare offers a yearly Wellness Visit which allows our clinical staff to assess your need for preventative services including immunizations, lifestyle education, counseling to decrease risk of preventable diseases and screening for fall risk and other medical concerns.    This visit is provided free of charge (no copay) for all Medicare recipients. The clinical pharmacists at [facility] have begun to conduct these Wellness Visits which will also include a thorough review of all your medications.    As you primary medical provider recommend that you make an appointment for your Annual Wellness Visit if you have not done so already this year.  You may set up this appointment before you leave today or you may call back ([phone]) and schedule an appointment.  Please make sure when you call that you mention that you are scheduling your Annual Wellness Visit with the clinical pharmacist so that the appointment may be made for the proper length of time.      Continue current medications. Continue good therapeutic lifestyle changes which include good diet and exercise. Fall precautions discussed with patient. If an FOBT was given today- please return it to our front desk. If you are over 18 years old - you may need Prevnar 80 or the adult Pneumonia vaccine.  Drink plenty of fluids  return the FOBT  we will call you with the results of the lab work was as results are available  return to the clinic for your mammogram We will schedule a DEXA scan later in the year You'll need to get your pelvic exam in September

## 2014-03-05 NOTE — Progress Notes (Signed)
Subjective:    Patient ID: [name], female    DOB: 11/26/1940, 73 y.o.   MRN: [id number]  HPI Pt here for follow up and management of chronic medical problems.        Patient Active Problem List   Diagnosis Date Noted  . Hyperlipemia 02/14/2013  . Hypothyroid 02/14/2013  . Vitamin D deficiency 02/14/2013  . Generalized anxiety disorder 02/14/2013  . Constipation   . Esophageal stricture   . Osteoporosis   . GERD (gastroesophageal reflux disease)    Outpatient Encounter Prescriptions as of 03/05/2014  Medication Sig  . atorvastatin (LIPITOR) 80 MG tablet Take 1 tablet (80 mg total) by mouth daily.  . Cholecalciferol (VITAMIN D3) 5000 UNITS TABS Take 1 tablet by mouth daily.    . hydrochlorothiazide (HYDRODIURIL) 25 MG tablet Take 1 tablet (25 mg total) by mouth daily.  Marland Kitchen levothyroxine (SYNTHROID, LEVOTHROID) 100 MCG tablet 1/2 tablet qd  . omeprazole (PRILOSEC) 20 MG capsule Take 1 capsule (20 mg total) by mouth daily.  Marland Kitchen oxazepam (SERAX) 15 MG capsule Take 1 capsule (15 mg total) by mouth 3 (three) times daily.  . potassium chloride 20 MEQ/15ML (10%) SOLN Take 1 and 1/2 tsp. qd  . triamcinolone cream (KENALOG) 0.1 %     Review of Systems  Constitutional: Negative.   HENT: Negative.   Eyes: Negative.   Respiratory: Negative.   Cardiovascular: Negative.   Gastrointestinal: Negative.   Endocrine: Negative.   Genitourinary: Negative.   Musculoskeletal: Negative.   Skin: Negative.   Allergic/Immunologic: Negative.   Neurological: Negative.   Hematological: Negative.   Psychiatric/Behavioral: Negative.        Objective:   Physical Exam  Nursing note and vitals reviewed. Constitutional: She is oriented to person, place, and time. She appears well-developed and well-nourished. No distress.  HENT:  Head: Normocephalic and atraumatic.  Right Ear: External ear normal.  Left Ear: External ear normal.  Nose: Nose normal.  Mouth/Throat: Oropharynx is clear and  moist. No oropharyngeal exudate.  Edentulous  Eyes: Conjunctivae and EOM are normal. Pupils are equal, round, and reactive to light. Right eye exhibits no discharge. Left eye exhibits no discharge. No scleral icterus.  Neck: Normal range of motion. Neck supple. No thyromegaly present.  No carotid bruits  Cardiovascular: Normal rate, regular rhythm, normal heart sounds and intact distal pulses.   No murmur heard. At 72 per minute  Pulmonary/Chest: Effort normal and breath sounds normal. No respiratory distress. She has no wheezes. She has no rales. She exhibits no tenderness.  Abdominal: Soft. Bowel sounds are normal. She exhibits no mass. There is no tenderness. There is no rebound and no guarding.  Musculoskeletal: Normal range of motion. She exhibits no edema and no tenderness.  Lymphadenopathy:    She has no cervical adenopathy.  Neurological: She is alert and oriented to person, place, and time. She has normal reflexes. No cranial nerve deficit.  Skin: Skin is warm and dry. No rash noted.  Psychiatric: She has a normal mood and affect. Her behavior is normal. Judgment and thought content normal.   BP 119/76  Pulse 89  Temp(Src) 99 F (37.2 C) (Oral)  Ht 5' 2"  (1.575 m)  Wt 109 lb (49.442 kg)  BMI 19.93 kg/m2        Assessment & Plan:   1. Hyperlipemia - POCT CBC - BMP8+EGFR - Hepatic function panel - NMR, lipoprofile  2. GERD (gastroesophageal reflux disease) - POCT CBC - Hepatic function panel  3. Hypothyroid - POCT CBC - Thyroid Panel With TSH  4. Vitamin D deficiency - POCT CBC - Vit D  25 hydroxy (rtn osteoporosis monitoring)  Patient Instructions                       Medicare Annual Wellness Visit  [facility] and the medical providers at [facility] strive to bring you the best medical care.  In doing so we not only want to address your current medical conditions and concerns but also to detect new conditions early and prevent  illness, disease and health-related problems.    Medicare offers a yearly Wellness Visit which allows our clinical staff to assess your need for preventative services including immunizations, lifestyle education, counseling to decrease risk of preventable diseases and screening for fall risk and other medical concerns.    This visit is provided free of charge (no copay) for all Medicare recipients. The clinical pharmacists at [facility] have begun to conduct these Wellness Visits which will also include a thorough review of all your medications.    As you primary medical provider recommend that you make an appointment for your Annual Wellness Visit if you have not done so already this year.  You may set up this appointment before you leave today or you may call back [phone]) and schedule an appointment.  Please make sure when you call that you mention that you are scheduling your Annual Wellness Visit with the clinical pharmacist so that the appointment may be made for the proper length of time.      Continue current medications. Continue good therapeutic lifestyle changes which include good diet and exercise. Fall precautions discussed with patient. If an FOBT was given today- please return it to our front desk. If you are over 10 years old - you may need Prevnar 75 or the adult Pneumonia vaccine.  Drink plenty of fluids  return the FOBT  we will call you with the results of the lab work was as results are available  return to the clinic for your mammogram We will schedule a DEXA scan later in the year You'll need to get your pelvic exam in September      Arrie Senate MD

## 2014-03-06 LAB — NMR, LIPOPROFILE
Cholesterol: 138 mg/dL (ref 100–199)
HDL Cholesterol by NMR: 49 mg/dL (ref >39)
HDL Particle Number: 35.7 umol/L (ref >=30.5)
LDL PARTICLE NUMBER: 925 nmol/L (ref <1000)
LDL SIZE: 20.5 nm (ref >20.5)
LDLC SERPL CALC-MCNC: 68 mg/dL (ref 0–99)
LP-IR SCORE: 45 (ref <=45)
SMALL LDL PARTICLE NUMBER: 551 nmol/L — AB (ref <=527)
TRIGLYCERIDES BY NMR: 105 mg/dL (ref 0–149)

## 2014-03-06 LAB — THYROID PANEL WITH TSH
Free Thyroxine Index: 3.1 (ref 1.2–4.9)
T3 UPTAKE RATIO: 28 % (ref 24–39)
T4, Total: 10.9 ug/dL (ref 4.5–12.0)
TSH: 0.182 u[IU]/mL — ABNORMAL LOW (ref 0.450–4.500)

## 2014-03-06 LAB — BMP8+EGFR
BUN/Creatinine Ratio: 14 (ref 11–26)
BUN: 16 mg/dL (ref 8–27)
CO2: 28 mmol/L (ref 18–29)
CREATININE: 1.11 mg/dL — AB (ref 0.57–1.00)
Calcium: 9.7 mg/dL (ref 8.7–10.3)
Chloride: 100 mmol/L (ref 97–108)
GFR, EST AFRICAN AMERICAN: 57 mL/min/{1.73_m2} — AB (ref >59)
GFR, EST NON AFRICAN AMERICAN: 49 mL/min/{1.73_m2} — AB (ref >59)
Glucose: 96 mg/dL (ref 65–99)
POTASSIUM: 3.5 mmol/L (ref 3.5–5.2)
SODIUM: 144 mmol/L (ref 134–144)

## 2014-03-06 LAB — VITAMIN D 25 HYDROXY (VIT D DEFICIENCY, FRACTURES): Vit D, 25-Hydroxy: 35.1 ng/mL (ref 30.0–100.0)

## 2014-03-06 LAB — HEPATIC FUNCTION PANEL
ALK PHOS: 82 IU/L (ref 39–117)
ALT: 12 IU/L (ref 0–32)
AST: 19 IU/L (ref 0–40)
Albumin: 3.9 g/dL (ref 3.5–4.8)
BILIRUBIN DIRECT: 0.12 mg/dL (ref 0.00–0.40)
BILIRUBIN TOTAL: 0.5 mg/dL (ref 0.0–1.2)
Total Protein: 6.6 g/dL (ref 6.0–8.5)

## 2014-03-07 NOTE — Telephone Encounter (Signed)
rx called into pharmacy

## 2014-03-07 NOTE — Telephone Encounter (Signed)
Patient last seen in office on 03-05-14. Rx last filled on 12-17-13 for #90. Please advise. If approved please route to pool A so nurse can phone in to pharmacy

## 2014-03-07 NOTE — Telephone Encounter (Signed)
This prescription is okay to refill 

## 2014-03-25 ENCOUNTER — Ambulatory Visit (INDEPENDENT_AMBULATORY_CARE_PROVIDER_SITE_OTHER): Payer: Medicare HMO | Admitting: Family Medicine

## 2014-03-25 VITALS — BP 128/80 | HR 92 | Temp 97.7°F | Ht 62.0 in

## 2014-03-25 DIAGNOSIS — K219 Gastro-esophageal reflux disease without esophagitis: Secondary | ICD-10-CM

## 2014-03-25 DIAGNOSIS — R079 Chest pain, unspecified: Secondary | ICD-10-CM

## 2014-03-25 MED ORDER — OMEPRAZOLE 20 MG PO CPDR: 20.0000 mg | DELAYED_RELEASE_CAPSULE | Freq: Two times a day (BID) | ORAL | Status: DC

## 2014-03-25 NOTE — Progress Notes (Signed)
   Subjective:    Patient ID: [name], female    DOB: May 05, 1941, 73 y.o.   MRN: [id number]  HPI This 73 y.o. female presents for evaluation of GERD sx's and chest pain today.  She has Been feeling like her GERD meds are not working and she is having some burning down her arms.   Review of Systems C/o GERD and chest pain No  SOB, HA, dizziness, vision change, N/V, diarrhea, constipation, dysuria, urinary urgency or frequency, myalgias, arthralgias or rash.     Objective:   Physical Exam   Vital signs noted  Well developed well nourished female.  HEENT - Head atraumatic Normocephalic                Eyes - PERRLA, Conjuctiva - clear Sclera- Clear EOMI                Throat - oropharanx wnl Respiratory - Lungs CTA bilateral Cardiac - RRR S1 and S2 without murmur GI - Abdomen soft tender epigastric region and bowel sounds active x 4 Extremities - No edema. Neuro - Grossly intact.  EKG - NSR without acute ST-T changes     Assessment & Plan:  Chest pain - Plan: EKG 12-Lead, omeprazole (PRILOSEC) 20 MG capsule bid GI cocktail 30 cc's po given and chest pain is resolved.  GERD (gastroesophageal reflux disease) - Plan: omeprazole (PRILOSEC) 20 MG capsule increase to bid.  Lysbeth Penner FNP

## 2014-04-02 ENCOUNTER — Telehealth: Payer: Self-pay | Admitting: Family Medicine

## 2014-04-02 MED ORDER — SUCRALFATE 1 GM/10ML PO SUSP: 1.0000 g | Freq: Every day | ORAL | Status: DC

## 2014-04-02 MED ORDER — SUCRALFATE 1 GM/10ML PO SUSP: 1.0000 g | Freq: Two times a day (BID) | ORAL | Status: DC

## 2014-04-02 NOTE — Telephone Encounter (Signed)
Burning and pain down esophagus  She is on omeprazole 20 bid Saw Bill O for cardiac check/EKG and work up on 03/25/14 Do you think she could have a ulcer or a problem with her Gallbladder ( the pt wonders???) Please address

## 2014-04-02 NOTE — Telephone Encounter (Signed)
Please make sure that the patient does not take any NSAIDs Change the omeprazole and have her take 2 every morning 20 mg Each one half hour before breakfast Add Carafate slurry 2 teaspoons before lunch supper and at bedtime If burning gets worse, an appointment should be scheduled with a gastroenterologist for an endoscopy--call back in one week for progress, or she should be seen sooner if she gets worse Make sure that she has done a recent FOBT

## 2014-04-03 ENCOUNTER — Other Ambulatory Visit: Payer: Medicare HMO

## 2014-04-03 DIAGNOSIS — Z1212 Encounter for screening for malignant neoplasm of rectum: Secondary | ICD-10-CM

## 2014-04-03 NOTE — Progress Notes (Signed)
Patient dropped off fobt 

## 2014-04-04 LAB — FECAL OCCULT BLOOD, IMMUNOCHEMICAL: FECAL OCCULT BLOOD: NEGATIVE

## 2014-05-23 ENCOUNTER — Other Ambulatory Visit: Payer: Self-pay | Admitting: Family Medicine

## 2014-05-27 ENCOUNTER — Other Ambulatory Visit: Payer: Self-pay | Admitting: *Deleted

## 2014-05-27 NOTE — Telephone Encounter (Signed)
Done per jamie bullins

## 2014-05-27 NOTE — Telephone Encounter (Signed)
Patient last seen in office on 03-05-14. Rx last filled on 04-24-14 for #90. Please advise. If approved please route to Pool A so nurse can phone in to pharmacy

## 2014-05-27 NOTE — Telephone Encounter (Signed)
This is okay to refill 

## 2014-06-25 ENCOUNTER — Other Ambulatory Visit: Payer: Self-pay | Admitting: Family Medicine

## 2014-06-26 NOTE — Telephone Encounter (Signed)
This is ok to refill for 3 months

## 2014-06-26 NOTE — Telephone Encounter (Signed)
Called in.

## 2014-06-26 NOTE — Telephone Encounter (Signed)
Last ov 5/15. Last refill 05/27/14. If approved route to nurse pool to be called to [facility].

## 2014-07-17 ENCOUNTER — Ambulatory Visit (INDEPENDENT_AMBULATORY_CARE_PROVIDER_SITE_OTHER): Payer: Medicare HMO | Admitting: Family Medicine

## 2014-07-17 ENCOUNTER — Encounter: Payer: Self-pay | Admitting: Family Medicine

## 2014-07-17 VITALS — BP 102/60 | HR 62 | Temp 98.0°F | Ht 62.0 in | Wt 101.0 lb

## 2014-07-17 DIAGNOSIS — I499 Cardiac arrhythmia, unspecified: Secondary | ICD-10-CM

## 2014-07-17 DIAGNOSIS — K219 Gastro-esophageal reflux disease without esophagitis: Secondary | ICD-10-CM

## 2014-07-17 DIAGNOSIS — F411 Generalized anxiety disorder: Secondary | ICD-10-CM

## 2014-07-17 DIAGNOSIS — E785 Hyperlipidemia, unspecified: Secondary | ICD-10-CM

## 2014-07-17 DIAGNOSIS — E039 Hypothyroidism, unspecified: Secondary | ICD-10-CM

## 2014-07-17 DIAGNOSIS — E559 Vitamin D deficiency, unspecified: Secondary | ICD-10-CM

## 2014-07-17 DIAGNOSIS — Z23 Encounter for immunization: Secondary | ICD-10-CM

## 2014-07-17 LAB — POCT CBC
Granulocyte percent: 59 %G (ref 37–80)
HCT, POC: 40.2 % (ref 37.7–47.9)
Hemoglobin: 13.3 g/dL (ref 12.2–16.2)
LYMPH, POC: 2.5 (ref 0.6–3.4)
MCH: 29.6 pg (ref 27–31.2)
MCHC: 33.1 g/dL (ref 31.8–35.4)
MCV: 89.3 fL (ref 80–97)
MPV: 8 fL (ref 0–99.8)
PLATELET COUNT, POC: 263 10*3/uL (ref 142–424)
POC Granulocyte: 4.1 (ref 2–6.9)
POC LYMPH PERCENT: 35.7 %L (ref 10–50)
RBC: 4.5 M/uL (ref 4.04–5.48)
RDW, POC: 13.3 %
WBC: 7 10*3/uL (ref 4.6–10.2)

## 2014-07-17 MED ORDER — OXAZEPAM 15 MG PO CAPS: ORAL_CAPSULE | ORAL | Status: DC

## 2014-07-17 MED ORDER — ESCITALOPRAM OXALATE 10 MG PO TABS: 10.0000 mg | ORAL_TABLET | Freq: Every day | ORAL | Status: DC

## 2014-07-17 NOTE — Progress Notes (Signed)
Subjective:    Patient ID: [name], female    DOB: 01-16-1941, 73 y.o.   MRN: [id number]  HPI Pt here for follow up and management of chronic medical problems. The patient complains of decreased appetite and increased anxiety . The patient has also been taking a whole blood pressure pill a day and set up a half a one like she was instructed to do.         Patient Active Problem List   Diagnosis Date Noted  . Hyperlipemia 02/14/2013  . Hypothyroid 02/14/2013  . Vitamin D deficiency 02/14/2013  . Generalized anxiety disorder 02/14/2013  . Constipation   . Esophageal stricture   . Osteoporosis   . GERD (gastroesophageal reflux disease)    Outpatient Encounter Prescriptions as of 07/17/2014  Medication Sig  . atorvastatin (LIPITOR) 80 MG tablet Take 1 tablet (80 mg total) by mouth daily.  . Cholecalciferol (VITAMIN D3) 5000 UNITS TABS Take 1 tablet by mouth daily.    . hydrochlorothiazide (HYDRODIURIL) 25 MG tablet Take 1 tablet (25 mg total) by mouth daily.  Marland Kitchen levothyroxine (SYNTHROID, LEVOTHROID) 100 MCG tablet Take 100 mcg by mouth daily before breakfast. 1/2 tablet on Sunday  . Multiple Vitamin (MULTIVITAMIN WITH MINERALS) TABS tablet Take 1 tablet by mouth daily.  Marland Kitchen omeprazole (PRILOSEC) 20 MG capsule Take 1 capsule (20 mg total) by mouth 2 (two) times daily before a meal.  . oxazepam (SERAX) 15 MG capsule TAKE ONE (1) CAPSULE THREE (3) TIMES EACH DAY  . potassium chloride 20 MEQ/15ML (10%) SOLN Take 1 and 1/2 tsp. qd  . sucralfate (CARAFATE) 1 GM/10ML suspension Take 10 mLs (1 g total) by mouth 2 (two) times daily. Before lunch, before supper,and bedtime  . triamcinolone cream (KENALOG) 0.1 %   . [DISCONTINUED] levothyroxine (SYNTHROID, LEVOTHROID) 100 MCG tablet 1/2 tablet qd    Review of Systems  Constitutional: Positive for appetite change (decrease).  HENT: Negative.   Eyes: Negative.   Respiratory: Negative.   Cardiovascular: Negative.   Gastrointestinal:  Negative.   Endocrine: Negative.   Genitourinary: Negative.   Musculoskeletal: Negative.   Skin: Negative.   Allergic/Immunologic: Negative.   Neurological: Negative.   Hematological: Negative.   Psychiatric/Behavioral: The patient is nervous/anxious (anxiety ).        Objective:   Physical Exam  Nursing note and vitals reviewed. Constitutional: She is oriented to person, place, and time. No distress.  Thin, somewhat frail but alert  HENT:  Head: Normocephalic and atraumatic.  Right Ear: External ear normal.  Left Ear: External ear normal.  Nose: Nose normal.  Mouth/Throat: Oropharynx is clear and moist.  Upper and lower dentures  Eyes: Conjunctivae and EOM are normal. Pupils are equal, round, and reactive to light. Right eye exhibits no discharge. Left eye exhibits no discharge. No scleral icterus.  Neck: Normal range of motion. Neck supple. No thyromegaly present.  No adenopathy or thyromegaly  Cardiovascular: Normal rate, regular rhythm, normal heart sounds and intact distal pulses.  Exam reveals no gallop and no friction rub.   No murmur heard. The heart is irregular at about 84 per minute with probable PVCs as on the previous EKG, the pedal pulses are somewhat diminished and there is no edema  Pulmonary/Chest: Effort normal and breath sounds normal. No respiratory distress. She has no wheezes. She has no rales. She exhibits no tenderness.  Abdominal: Soft. Bowel sounds are normal. She exhibits no mass. There is no tenderness. There is no rebound and  no guarding.  Musculoskeletal: Normal range of motion. She exhibits no edema and no tenderness.  Lymphadenopathy:    She has no cervical adenopathy.  Neurological: She is alert and oriented to person, place, and time. She has normal reflexes. No cranial nerve deficit.  Skin: Skin is warm and dry. No rash noted.  Psychiatric: She has a normal mood and affect. Her behavior is normal. Judgment and thought content normal.   BP  102/60  Pulse 62  Temp(Src) 98 F (36.7 C) (Oral)  Ht 5' 2"  (1.575 m)  Wt 101 lb (45.813 kg)  BMI 18.47 kg/m2  EKG: Occasional PAC       Assessment & Plan:  1. Gastroesophageal reflux disease, esophagitis presence not specified - POCT CBC  2. Vitamin D deficiency - POCT CBC - Vit D  25 hydroxy (rtn osteoporosis monitoring)  3. Hyperlipemia - POCT CBC - BMP8+EGFR - Hepatic function panel - NMR, lipoprofile  4. Hypothyroidism, unspecified hypothyroidism type - POCT CBC - Thyroid Panel With TSH  5. Generalized anxiety disorder - escitalopram (LEXAPRO) 10 MG tablet; Take 1 tablet (10 mg total) by mouth daily.  Dispense: 30 tablet; Refill: 2  6. Irregular heartbeat - EKG 12-Lead  Meds ordered this encounter  Medications  . Multiple Vitamin (MULTIVITAMIN WITH MINERALS) TABS tablet    Sig: Take 1 tablet by mouth daily.  Marland Kitchen levothyroxine (SYNTHROID, LEVOTHROID) 100 MCG tablet    Sig: Take 100 mcg by mouth daily before breakfast. 1/2 tablet on Sunday  . oxazepam (SERAX) 15 MG capsule    Sig: TAKE ONE (1) CAPSULE THREE (3) TIMES EACH DAY    Dispense:  90 capsule    Refill:  1  . escitalopram (LEXAPRO) 10 MG tablet    Sig: Take 1 tablet (10 mg total) by mouth daily.    Dispense:  30 tablet    Refill:  2   Patient Instructions                       Medicare Annual Wellness Visit  [facility] and the medical providers at [facility] strive to bring you the best medical care.  In doing so we not only want to address your current medical conditions and concerns but also to detect new conditions early and prevent illness, disease and health-related problems.    Medicare offers a yearly Wellness Visit which allows our clinical staff to assess your need for preventative services including immunizations, lifestyle education, counseling to decrease risk of preventable diseases and screening for fall risk and other medical concerns.    This visit is  provided free of charge (no copay) for all Medicare recipients. The clinical pharmacists at [facility] have begun to conduct these Wellness Visits which will also include a thorough review of all your medications.    As you primary medical provider recommend that you make an appointment for your Annual Wellness Visit if you have not done so already this year.  You may set up this appointment before you leave today or you may call back ([phone]) and schedule an appointment.  Please make sure when you call that you mention that you are scheduling your Annual Wellness Visit with the clinical pharmacist so that the appointment may be made for the proper length of time.     Continue current medications. Continue good therapeutic lifestyle changes which include good diet and exercise. Fall precautions discussed with patient. If an FOBT was given  today- please return it to our front desk. If you are over 67 years old - you may need Prevnar 73 or the adult Pneumonia vaccine.  Flu Shots will be available at our office starting mid- September. Please call and schedule a FLU CLINIC APPOINTMENT.   Continue to take blood pressure pill but only one half daily. Try exercise more regularly Avoid caffeine Continue to take multivitamin and vitamin D Use cortisone 10 cream sparingly 2-3 times weekly in ear canal for itching, avoid hairsprays and hair drying   Arrie Senate MD

## 2014-07-17 NOTE — Patient Instructions (Addendum)
Medicare Annual Wellness Visit  [facility] and the medical providers at [facility] strive to bring you the best medical care.  In doing so we not only want to address your current medical conditions and concerns but also to detect new conditions early and prevent illness, disease and health-related problems.    Medicare offers a yearly Wellness Visit which allows our clinical staff to assess your need for preventative services including immunizations, lifestyle education, counseling to decrease risk of preventable diseases and screening for fall risk and other medical concerns.    This visit is provided free of charge (no copay) for all Medicare recipients. The clinical pharmacists at [facility] have begun to conduct these Wellness Visits which will also include a thorough review of all your medications.    As you primary medical provider recommend that you make an appointment for your Annual Wellness Visit if you have not done so already this year.  You may set up this appointment before you leave today or you may call back ([phone]) and schedule an appointment.  Please make sure when you call that you mention that you are scheduling your Annual Wellness Visit with the clinical pharmacist so that the appointment may be made for the proper length of time.     Continue current medications. Continue good therapeutic lifestyle changes which include good diet and exercise. Fall precautions discussed with patient. If an FOBT was given today- please return it to our front desk. If you are over 28 years old - you may need Prevnar 71 or the adult Pneumonia vaccine.  Flu Shots will be available at our office starting mid- September. Please call and schedule a FLU CLINIC APPOINTMENT.   Continue to take blood pressure pill but only one half daily. Try exercise more regularly Avoid caffeine Continue to take multivitamin and vitamin  D Use cortisone 10 cream sparingly 2-3 times weekly in ear canal for itching, avoid hairsprays and hair drying

## 2014-07-18 LAB — THYROID PANEL WITH TSH
FREE THYROXINE INDEX: 6.3 — AB (ref 1.2–4.9)
T3 Uptake Ratio: 38 % (ref 24–39)
T4, Total: 16.7 ug/dL — ABNORMAL HIGH (ref 4.5–12.0)
TSH: 0.007 u[IU]/mL — AB (ref 0.450–4.500)

## 2014-07-18 LAB — NMR, LIPOPROFILE
Cholesterol: 140 mg/dL (ref 100–199)
HDL Cholesterol by NMR: 47 mg/dL (ref >39)
HDL PARTICLE NUMBER: 37.7 umol/L (ref >=30.5)
LDL Particle Number: 836 nmol/L (ref <1000)
LDL Size: 20.4 nm (ref >20.5)
LDLC SERPL CALC-MCNC: 63 mg/dL (ref 0–99)
LP-IR SCORE: 60 — AB (ref <=45)
SMALL LDL PARTICLE NUMBER: 441 nmol/L (ref <=527)
Triglycerides by NMR: 152 mg/dL — ABNORMAL HIGH (ref 0–149)

## 2014-07-18 LAB — BMP8+EGFR
BUN / CREAT RATIO: 17 (ref 11–26)
BUN: 20 mg/dL (ref 8–27)
CHLORIDE: 98 mmol/L (ref 97–108)
CO2: 27 mmol/L (ref 18–29)
Calcium: 9.9 mg/dL (ref 8.7–10.3)
Creatinine, Ser: 1.16 mg/dL — ABNORMAL HIGH (ref 0.57–1.00)
GFR calc non Af Amer: 46 mL/min/{1.73_m2} — ABNORMAL LOW (ref >59)
GFR, EST AFRICAN AMERICAN: 53 mL/min/{1.73_m2} — AB (ref >59)
Glucose: 115 mg/dL — ABNORMAL HIGH (ref 65–99)
Potassium: 3.3 mmol/L — ABNORMAL LOW (ref 3.5–5.2)
Sodium: 143 mmol/L (ref 134–144)

## 2014-07-18 LAB — HEPATIC FUNCTION PANEL
ALK PHOS: 78 IU/L (ref 39–117)
ALT: 12 IU/L (ref 0–32)
AST: 18 IU/L (ref 0–40)
Albumin: 4.3 g/dL (ref 3.5–4.8)
BILIRUBIN TOTAL: 0.6 mg/dL (ref 0.0–1.2)
Bilirubin, Direct: 0.16 mg/dL (ref 0.00–0.40)
Total Protein: 6.9 g/dL (ref 6.0–8.5)

## 2014-07-18 LAB — VITAMIN D 25 HYDROXY (VIT D DEFICIENCY, FRACTURES): VIT D 25 HYDROXY: 39.5 ng/mL (ref 30.0–100.0)

## 2014-07-23 ENCOUNTER — Other Ambulatory Visit: Payer: Self-pay | Admitting: Family Medicine

## 2014-07-23 ENCOUNTER — Encounter: Payer: Self-pay | Admitting: Family Medicine

## 2014-07-23 ENCOUNTER — Telehealth: Payer: Self-pay | Admitting: Family Medicine

## 2014-07-23 NOTE — Telephone Encounter (Signed)
Message copied by Waverly Ferrari on Tue Jul 23, 2014 12:10 PM ------      Message from: Chipper Herb      Created: Thu Jul 18, 2014  2:08 PM       The blood sugar is slightly elevated at 1:15. The creatinine remains slightly elevated at 1.16. The potassium is low at 3.3. Make sure that she is taking her current potassium regularly. The patient should avoid all NSAIDs like ibuprofen and Aleve.----- repeat BMP nonfasting in one week. If potassium remains low we will have to increase her current dose of potassium      All liver function tests are within normal      Cholesterol numbers with advanced lipid testing are excellent and at goal except the triglycerides are slightly elevated.----Continue current cholesterol medication and try to do better with therapeutic lifestyle changes which include diet and exercise      The vitamin D level is good, continue current treatment      The TSH is very low. This means the patient is getting too much thyroid medication------ please confirm her current treatment regimen with Synthroid. It appears that she is taking 100 mcg one daily except one half on Sunday------ if this is correct she should reduce this further by taking one half on Monday Wednesday and Friday and one hole by mouth on all other days. She should have her thyroid profile rechecked in 6 weeks.. Please confirm how she is currently taking her medicine and make sure she understands that we are reducing it in this way. ------

## 2014-08-01 ENCOUNTER — Other Ambulatory Visit (INDEPENDENT_AMBULATORY_CARE_PROVIDER_SITE_OTHER): Payer: Medicare HMO

## 2014-08-01 DIAGNOSIS — E876 Hypokalemia: Secondary | ICD-10-CM

## 2014-08-01 NOTE — Progress Notes (Signed)
Lab only 

## 2014-08-02 LAB — BMP8+EGFR
BUN/Creatinine Ratio: 14 (ref 11–26)
BUN: 14 mg/dL (ref 8–27)
CALCIUM: 9.3 mg/dL (ref 8.7–10.3)
CHLORIDE: 100 mmol/L (ref 97–108)
CO2: 25 mmol/L (ref 18–29)
Creatinine, Ser: 0.97 mg/dL (ref 0.57–1.00)
GFR calc Af Amer: 66 mL/min/{1.73_m2} (ref >59)
GFR calc non Af Amer: 57 mL/min/{1.73_m2} — ABNORMAL LOW (ref >59)
GLUCOSE: 114 mg/dL — AB (ref 65–99)
Potassium: 4 mmol/L (ref 3.5–5.2)
Sodium: 142 mmol/L (ref 134–144)

## 2014-08-05 ENCOUNTER — Telehealth: Payer: Self-pay | Admitting: *Deleted

## 2014-08-05 NOTE — Telephone Encounter (Signed)
Aware. 

## 2014-08-05 NOTE — Telephone Encounter (Signed)
Message copied by Shelbie Ammons on Mon Aug 05, 2014  3:19 PM ------      Message from: Chipper Herb      Created: Fri Aug 02, 2014  7:29 AM       The repeat BMP has a blood sugar that remains elevated at 114. The creatinine, the most important kidney function test that was elevated previously is now within normal limits. The electrolytes including potassium are also within normal limits. ------

## 2014-08-16 ENCOUNTER — Other Ambulatory Visit (INDEPENDENT_AMBULATORY_CARE_PROVIDER_SITE_OTHER): Payer: Medicare HMO

## 2014-08-16 DIAGNOSIS — E039 Hypothyroidism, unspecified: Secondary | ICD-10-CM

## 2014-08-16 DIAGNOSIS — B3749 Other urogenital candidiasis: Secondary | ICD-10-CM

## 2014-08-16 LAB — POCT UA - MICROSCOPIC ONLY
Bacteria, U Microscopic: NEGATIVE
CASTS, UR, LPF, POC: NEGATIVE
CRYSTALS, UR, HPF, POC: NEGATIVE
MUCUS UA: NEGATIVE
RBC, urine, microscopic: NEGATIVE
YEAST UA: NEGATIVE

## 2014-08-16 LAB — POCT URINALYSIS DIPSTICK
Bilirubin, UA: NEGATIVE
Glucose, UA: NEGATIVE
Ketones, UA: NEGATIVE
NITRITE UA: NEGATIVE
PH UA: 7.5
PROTEIN UA: NEGATIVE
RBC UA: NEGATIVE
Spec Grav, UA: 1.01
Urobilinogen, UA: NEGATIVE

## 2014-08-16 NOTE — Addendum Note (Signed)
Addended by: Selmer Dominion on: 08/16/2014 12:44 PM   Modules accepted: Orders

## 2014-08-16 NOTE — Progress Notes (Signed)
Lab only 

## 2014-08-17 LAB — URINE CULTURE: Organism ID, Bacteria: NO GROWTH

## 2014-08-17 LAB — THYROID PANEL WITH TSH
Free Thyroxine Index: 3.8 (ref 1.2–4.9)
T3 Uptake Ratio: 32 % (ref 24–39)
T4 TOTAL: 12 ug/dL (ref 4.5–12.0)
TSH: 0.007 u[IU]/mL — AB (ref 0.450–4.500)

## 2014-08-20 ENCOUNTER — Ambulatory Visit (INDEPENDENT_AMBULATORY_CARE_PROVIDER_SITE_OTHER): Payer: Medicare HMO | Admitting: Family Medicine

## 2014-08-20 ENCOUNTER — Encounter: Payer: Self-pay | Admitting: Family Medicine

## 2014-08-20 VITALS — BP 116/75 | HR 90 | Temp 97.2°F | Ht 62.0 in | Wt 105.0 lb

## 2014-08-20 DIAGNOSIS — N952 Postmenopausal atrophic vaginitis: Secondary | ICD-10-CM

## 2014-08-20 DIAGNOSIS — R300 Dysuria: Secondary | ICD-10-CM

## 2014-08-20 DIAGNOSIS — N898 Other specified noninflammatory disorders of vagina: Secondary | ICD-10-CM

## 2014-08-20 LAB — POCT URINALYSIS DIPSTICK
Bilirubin, UA: NEGATIVE
Glucose, UA: NEGATIVE
Ketones, UA: NEGATIVE
Leukocytes, UA: NEGATIVE
Nitrite, UA: NEGATIVE
PROTEIN UA: NEGATIVE
RBC UA: NEGATIVE
Spec Grav, UA: 1.01
UROBILINOGEN UA: NEGATIVE
pH, UA: 6

## 2014-08-20 LAB — POCT UA - MICROSCOPIC ONLY
Bacteria, U Microscopic: NEGATIVE
Casts, Ur, LPF, POC: NEGATIVE
Crystals, Ur, HPF, POC: NEGATIVE
Mucus, UA: NEGATIVE
RBC, urine, microscopic: NEGATIVE
WBC, UR, HPF, POC: NEGATIVE
YEAST UA: NEGATIVE

## 2014-08-20 LAB — POCT WET PREP (WET MOUNT)
KOH WET PREP POC: POSITIVE
Trichomonas Wet Prep HPF POC: NEGATIVE

## 2014-08-20 NOTE — Progress Notes (Signed)
Subjective:    Patient ID: [name], female    DOB: 04/01/1941, 73 y.o.   MRN: [id number]  HPI Patient here today for dysuria, headache, and acid reflux. She is accompanied today by her husband.the patient continues to complain of burning with voiding and being uncomfortable and having raw feeling in the vaginal area.A recent urine culture had no growth. She does complain of some white discharge to the nurse.She also complains of increased heartburn.         Patient Active Problem List   Diagnosis Date Noted  . Hyperlipemia 02/14/2013  . Hypothyroid 02/14/2013  . Vitamin D deficiency 02/14/2013  . Generalized anxiety disorder 02/14/2013  . Constipation   . Esophageal stricture   . Osteoporosis   . GERD (gastroesophageal reflux disease)    Outpatient Encounter Prescriptions as of 08/20/2014  Medication Sig  . atorvastatin (LIPITOR) 80 MG tablet Take 1 tablet (80 mg total) by mouth daily.  . Cholecalciferol (VITAMIN D3) 5000 UNITS TABS Take 1 tablet by mouth daily.    Marland Kitchen escitalopram (LEXAPRO) 10 MG tablet Take 1 tablet (10 mg total) by mouth daily.  . hydrochlorothiazide (HYDRODIURIL) 25 MG tablet Take 1 tablet (25 mg total) by mouth daily.  Marland Kitchen levothyroxine (SYNTHROID, LEVOTHROID) 100 MCG tablet Take 100 mcg by mouth daily before breakfast. 1/2 tablet on Sunday  . Multiple Vitamin (MULTIVITAMIN WITH MINERALS) TABS tablet Take 1 tablet by mouth daily.  Marland Kitchen omeprazole (PRILOSEC) 20 MG capsule Take 1 capsule (20 mg total) by mouth 2 (two) times daily before a meal.  . oxazepam (SERAX) 15 MG capsule TAKE ONE (1) CAPSULE THREE (3) TIMES EACH DAY  . potassium chloride 20 MEQ/15ML (10%) solution TAKE 1&1/2 TEASPOONFULS DAILY  . sucralfate (CARAFATE) 1 GM/10ML suspension Take 10 mLs (1 g total) by mouth 2 (two) times daily. Before lunch, before supper,and bedtime  . triamcinolone cream (KENALOG) 0.1 %     Review of Systems  Constitutional: Negative.   Eyes: Negative.     Respiratory: Negative.   Cardiovascular: Negative.   Gastrointestinal: Negative.        Heartburn  Endocrine: Negative.   Genitourinary: Positive for dysuria and urgency.       Vaginal irritation  Musculoskeletal: Negative.   Skin: Negative.   Allergic/Immunologic: Negative.   Neurological: Positive for headaches.  Hematological: Negative.   Psychiatric/Behavioral: Negative.        Objective:   Physical Exam  Constitutional: She is oriented to person, place, and time. No distress.  The patient is thin and somewhat frail. She is alert  Eyes: Conjunctivae and EOM are normal. Pupils are equal, round, and reactive to light. Right eye exhibits no discharge. Left eye exhibits no discharge. No scleral icterus.  Neck: Normal range of motion.  Pulmonary/Chest: Breath sounds normal.  Abdominal: Soft. Bowel sounds are normal. She exhibits no mass. There is tenderness. There is no rebound and no guarding.  Slight epigastric tenderness  Genitourinary:  Atrophic external genitalia, whitish gray discoloration with no obvious discharge----a wet prep was taken from the vagina for a KOH prep  Musculoskeletal: Normal range of motion. She exhibits no edema.  Neurological: She is alert and oriented to person, place, and time.  Skin: Skin is warm and dry. No rash noted.  Psychiatric: She has a normal mood and affect. Her behavior is normal. Judgment and thought content normal.  Nursing note and vitals reviewed.  BP 116/75 mmHg  Pulse 90  Temp(Src) 97.2 F (36.2 C) (Oral)  Ht 5\' 2"  (1.575 m)  Wt 105 lb (47.628 kg)  BMI 19.20 kg/m2      Results for orders placed or performed in visit on 08/20/14  POCT Wet Prep [facility])  Result Value Ref Range   Source Wet Prep POC vaginal    WBC, Wet Prep HPF POC 8-10    Bacteria Wet Prep HPF POC few    Clue Cells Wet Prep HPF POC None    Yeast Wet Prep HPF POC Moderate    KOH Wet Prep POC pos    Trichomonas Wet Prep HPF POC neg   POCT UA -  Microscopic Only  Result Value Ref Range   WBC, Ur, HPF, POC neg    RBC, urine, microscopic neg    Bacteria, U Microscopic neg    Mucus, UA neg    Epithelial cells, urine per micros occ    Crystals, Ur, HPF, POC neg    Casts, Ur, LPF, POC neg    Yeast, UA neg   POCT urinalysis dipstick  Result Value Ref Range   Color, UA yellow    Clarity, UA clear    Glucose, UA neg    Bilirubin, UA neg    Ketones, UA neg    Spec Grav, UA 1.010    Blood, UA neg    pH, UA 6.0    Protein, UA neg    Urobilinogen, UA negative    Nitrite, UA neg    Leukocytes, UA Negative       Assessment & Plan:  1. Dysuria - POCT Wet Prep [facility]) - POCT UA - Microalbumin - POCT UA - Microscopic Only - Urine culture  2. Vaginal irritation - POCT Wet Prep (Wet [facility]) - POCT UA - Microalbumin - POCT UA - Microscopic Only - Urine culture  3. Atrophic vaginitis -Premarin vaginal cream as directed intravaginally twice weekly -schedule for pelvic exam in 4 week  4.yeast vaginitis -Use Monistat vaginal cream on the days that you're not doing the Premarin vaginal cream  Patient Instructions  Use vaginal cream as directed intravaginally twice weekly We will arrange an appointment for a pelvic exam in one month   Arrie Senate MD

## 2014-08-20 NOTE — Patient Instructions (Addendum)
Use vaginal cream as directed intravaginally twice weekly -premarin We will arrange an appointment for a pelvic exam in one month with mary martin Use monistat vaginal cream otc on the days you dont use premarin Take otc zantac 150 twice a day

## 2014-08-22 LAB — URINE CULTURE: Organism ID, Bacteria: NO GROWTH

## 2014-09-19 ENCOUNTER — Encounter: Payer: Self-pay | Admitting: *Deleted

## 2014-09-25 ENCOUNTER — Encounter: Payer: Self-pay | Admitting: Nurse Practitioner

## 2014-09-25 ENCOUNTER — Ambulatory Visit (INDEPENDENT_AMBULATORY_CARE_PROVIDER_SITE_OTHER): Payer: Commercial Managed Care - HMO | Admitting: Nurse Practitioner

## 2014-09-25 VITALS — BP 140/85 | HR 118 | Temp 97.5°F | Ht 62.0 in | Wt 106.0 lb

## 2014-09-25 DIAGNOSIS — Z01419 Encounter for gynecological examination (general) (routine) without abnormal findings: Secondary | ICD-10-CM

## 2014-09-25 LAB — POCT URINALYSIS DIPSTICK
Bilirubin, UA: NEGATIVE
Blood, UA: NEGATIVE
Glucose, UA: NEGATIVE
Ketones, UA: NEGATIVE
Nitrite, UA: NEGATIVE
PROTEIN UA: NEGATIVE
Spec Grav, UA: 1.015
UROBILINOGEN UA: NEGATIVE
pH, UA: 6

## 2014-09-25 LAB — POCT UA - MICROSCOPIC ONLY
Bacteria, U Microscopic: NEGATIVE
CASTS, UR, LPF, POC: NEGATIVE
Crystals, Ur, HPF, POC: NEGATIVE
RBC, urine, microscopic: NEGATIVE
YEAST UA: NEGATIVE

## 2014-09-25 NOTE — Patient Instructions (Signed)

## 2014-09-25 NOTE — Progress Notes (Signed)
   Subjective:    Patient ID: [name], female    DOB: 1940/12/16, 73 y.o.   MRN: [id number]  HPI regular Patient of dr. Laurance Flatten that was sent today for pelvic only- She is doing well today without complaints. Patient says she has a purplish lesion on perineum that she wants looked at. Does not hurt and has not changed in size since she found it 2 weeks ago.    Review of Systems  Constitutional: Negative.   HENT: Negative.   Respiratory: Negative.   Cardiovascular: Negative.   Gastrointestinal: Negative.   Genitourinary: Negative.   Neurological: Negative.   Psychiatric/Behavioral: Negative.   All other systems reviewed and are negative.      Objective:   Physical Exam  Constitutional: She is oriented to person, place, and time. She appears well-developed and well-nourished.  HENT:  Head: Normocephalic.  Right Ear: Hearing, tympanic membrane, external ear and ear canal normal.  Left Ear: Hearing, tympanic membrane, external ear and ear canal normal.  Nose: Nose normal.  Mouth/Throat: Uvula is midline and oropharynx is clear and moist.  Eyes: Conjunctivae and EOM are normal. Pupils are equal, round, and reactive to light.  Neck: Normal range of motion and full passive range of motion without pain. Neck supple. No JVD present. Carotid bruit is not present. No thyroid mass and no thyromegaly present.  Cardiovascular: Normal rate, normal heart sounds and intact distal pulses.   No murmur heard. Pulmonary/Chest: Effort normal and breath sounds normal. Right breast exhibits no inverted nipple, no mass, no nipple discharge, no skin change and no tenderness. Left breast exhibits no inverted nipple, no mass, no nipple discharge, no skin change and no tenderness.  Abdominal: Soft. Bowel sounds are normal. She exhibits no mass. There is no tenderness.  Genitourinary: Vagina normal and uterus normal. No breast swelling, tenderness, discharge or bleeding.  bimanual exam-No adnexal masses  or tenderness. Purple macular lesion on right labia Labia adhesions  Musculoskeletal: Normal range of motion.  Lymphadenopathy:    She has no cervical adenopathy.  Neurological: She is alert and oriented to person, place, and time.  Skin: Skin is warm and dry.  Psychiatric: She has a normal mood and affect. Her behavior is normal. Judgment and thought content normal.   BP 140/85 mmHg  Pulse 118  Temp(Src) 97.5 F (36.4 C) (Oral)  Ht 5\' 2"  (1.575 m)  Wt 106 lb (48.081 kg)  BMI 19.38 kg/m2        Assessment & Plan:   1. Visit for pelvic exam    Keep follow up ppointment with dr. Mayra Neer, FNP

## 2014-09-30 ENCOUNTER — Other Ambulatory Visit: Payer: Self-pay | Admitting: Family Medicine

## 2014-10-01 NOTE — Telephone Encounter (Signed)
Refilled once with no RF's, next appt in January

## 2014-11-01 ENCOUNTER — Other Ambulatory Visit: Payer: Self-pay | Admitting: Family Medicine

## 2014-11-01 NOTE — Telephone Encounter (Signed)
Last filled 10/02/14, last seen 09/25/14. Call into [facility]

## 2014-11-15 ENCOUNTER — Other Ambulatory Visit: Payer: Self-pay | Admitting: Family Medicine

## 2014-11-27 ENCOUNTER — Ambulatory Visit (INDEPENDENT_AMBULATORY_CARE_PROVIDER_SITE_OTHER): Payer: Commercial Managed Care - HMO | Admitting: Family Medicine

## 2014-11-27 ENCOUNTER — Telehealth: Payer: Self-pay | Admitting: *Deleted

## 2014-11-27 ENCOUNTER — Encounter: Payer: Self-pay | Admitting: Family Medicine

## 2014-11-27 VITALS — BP 105/74 | HR 65 | Temp 96.9°F | Ht 62.0 in | Wt 107.0 lb

## 2014-11-27 DIAGNOSIS — E559 Vitamin D deficiency, unspecified: Secondary | ICD-10-CM | POA: Diagnosis not present

## 2014-11-27 DIAGNOSIS — L850 Acquired ichthyosis: Secondary | ICD-10-CM

## 2014-11-27 DIAGNOSIS — N898 Other specified noninflammatory disorders of vagina: Secondary | ICD-10-CM | POA: Diagnosis not present

## 2014-11-27 DIAGNOSIS — Z1283 Encounter for screening for malignant neoplasm of skin: Secondary | ICD-10-CM | POA: Diagnosis not present

## 2014-11-27 DIAGNOSIS — L853 Xerosis cutis: Secondary | ICD-10-CM

## 2014-11-27 DIAGNOSIS — R946 Abnormal results of thyroid function studies: Secondary | ICD-10-CM

## 2014-11-27 DIAGNOSIS — E785 Hyperlipidemia, unspecified: Secondary | ICD-10-CM

## 2014-11-27 DIAGNOSIS — R7989 Other specified abnormal findings of blood chemistry: Secondary | ICD-10-CM

## 2014-11-27 DIAGNOSIS — I10 Essential (primary) hypertension: Secondary | ICD-10-CM | POA: Diagnosis not present

## 2014-11-27 DIAGNOSIS — E039 Hypothyroidism, unspecified: Secondary | ICD-10-CM

## 2014-11-27 DIAGNOSIS — Z13820 Encounter for screening for osteoporosis: Secondary | ICD-10-CM

## 2014-11-27 DIAGNOSIS — K219 Gastro-esophageal reflux disease without esophagitis: Secondary | ICD-10-CM

## 2014-11-27 LAB — POCT CBC
Granulocyte percent: 58.9 %G (ref 37–80)
HCT, POC: 43.4 % (ref 37.7–47.9)
Hemoglobin: 13.2 g/dL (ref 12.2–16.2)
Lymph, poc: 3 (ref 0.6–3.4)
MCH: 28 pg (ref 27–31.2)
MCHC: 30.4 g/dL — AB (ref 31.8–35.4)
MCV: 91.9 fL (ref 80–97)
MPV: 8.2 fL (ref 0–99.8)
POC Granulocyte: 4.8 (ref 2–6.9)
POC LYMPH PERCENT: 36 %L (ref 10–50)
Platelet Count, POC: 266 10*3/uL (ref 142–424)
RBC: 4.7 M/uL (ref 4.04–5.48)
RDW, POC: 13.3 %
WBC: 8.2 10*3/uL (ref 4.6–10.2)

## 2014-11-27 MED ORDER — OXAZEPAM 15 MG PO CAPS: ORAL_CAPSULE | ORAL | Status: DC

## 2014-11-27 NOTE — Patient Instructions (Addendum)
Medicare Annual Wellness Visit  [facility] and the medical providers at [facility] strive to bring you the best medical care.  In doing so we not only want to address your current medical conditions and concerns but also to detect new conditions early and prevent illness, disease and health-related problems.    Medicare offers a yearly Wellness Visit which allows our clinical staff to assess your need for preventative services including immunizations, lifestyle education, counseling to decrease risk of preventable diseases and screening for fall risk and other medical concerns.    This visit is provided free of charge (no copay) for all Medicare recipients. The clinical pharmacists at [facility] have begun to conduct these Wellness Visits which will also include a thorough review of all your medications.    As you primary medical provider recommend that you make an appointment for your Annual Wellness Visit if you have not done so already this year.  You may set up this appointment before you leave today or you may call back ([phone]) and schedule an appointment.  Please make sure when you call that you mention that you are scheduling your Annual Wellness Visit with the clinical pharmacist so that the appointment may be made for the proper length of time.     Continue current medications. Continue good therapeutic lifestyle changes which include good diet and exercise. Fall precautions discussed with patient. If an FOBT was given today- please return it to our front desk. If you are over 59 years old - you may need Prevnar 9 or the adult Pneumonia vaccine.  Flu Shots are still available at our office. If you still haven't had one please call to set up a nurse visit to get one.   After your visit with [place] today you will receive a survey in the mail or online from [employer] regarding your care with [place]. Please take a moment to  fill this out. Your feedback is very important to [place] as you can help [place] better understand your patient needs as well as improve your experience and satisfaction. WE CARE ABOUT YOU!!!   Continue to use hydrating lotions Continue to avoid scented soap, fabric softener, and detergent Keep the house as cool as possible and use a cool mist humidifier Use the hydrating medication by using a couple capfuls in a tub of water after soaking for 5-10 minutes We will also arrange for you to have an appointment with the gynecologist because of your ongoing problems with vaginal itching and irritation

## 2014-11-27 NOTE — Progress Notes (Signed)
Subjective:    Patient ID: [name], female    DOB: 1941/07/17, 74 y.o.   MRN: [id number]  HPI Pt here for follow up and management of chronic medical problems which includes hypertension, hyperlipidemia, and hypothyroidism. She is taking medications regularly. The patient's only complaint today is her dry pruritic skin. She is tried multiple over-the-counter lotions for this and they do not seem to work. She is also already watching fabric softeners detergents and soaps and trying to use those that are sent 3 and this is still not helping her skin irritation. She has also tried creams vaginally for the irritation and this has not helped. In health maintenance issues she is due to get a DEXA scan and to get her lab work done. Recent lab work was reviewed with her and her TSH was low and this will need to be followed up. Her cholesterol numbers were good. Also one of her kidney function test was elevated and this will need to be followed up to. Her blood pressure is excellent and she continues to take her medication for her cholesterol and her blood pressure as well as her potassium. She is having no problems with reflux at this time. When she is still taking her thyroid replacement.         Patient Active Problem List   Diagnosis Date Noted  . Hyperlipemia 02/14/2013  . Hypothyroid 02/14/2013  . Vitamin D deficiency 02/14/2013  . Generalized anxiety disorder 02/14/2013  . Constipation   . Esophageal stricture   . Osteoporosis   . GERD (gastroesophageal reflux disease)    Outpatient Encounter Prescriptions as of 11/27/2014  Medication Sig  . atorvastatin (LIPITOR) 80 MG tablet TAKE 1 TABLET EVERY DAY  . Cholecalciferol (VITAMIN D3) 5000 UNITS TABS Take 1 tablet by mouth daily.    Marland Kitchen escitalopram (LEXAPRO) 10 MG tablet Take 1 tablet (10 mg total) by mouth daily.  . hydrochlorothiazide (HYDRODIURIL) 25 MG tablet TAKE 1 TABLET EVERY DAY  . levothyroxine (SYNTHROID, LEVOTHROID) 100  MCG tablet Take 100 mcg by mouth daily before breakfast. 1/2 tablet on Sunday  . Multiple Vitamin (MULTIVITAMIN WITH MINERALS) TABS tablet Take 1 tablet by mouth daily.  Marland Kitchen omeprazole (PRILOSEC) 20 MG capsule TAKE 1 CAPSULE TWICE DAILY BEFORE MEALS  . oxazepam (SERAX) 15 MG capsule TAKE ONE (1) CAPSULE THREE (3) TIMES EACH DAY  . potassium chloride 20 MEQ/15ML (10%) solution TAKE 1&1/2 TEASPOONFULS DAILY  . sucralfate (CARAFATE) 1 GM/10ML suspension Take 10 mLs (1 g total) by mouth 2 (two) times daily. Before lunch, before supper,and bedtime  . triamcinolone cream (KENALOG) 0.1 %     Review of Systems  Constitutional: Negative.   HENT: Negative.   Eyes: Negative.   Respiratory: Negative.   Cardiovascular: Negative.   Gastrointestinal: Negative.   Endocrine: Negative.   Genitourinary: Negative.   Musculoskeletal: Negative.   Skin: Negative.        Dry skin, itches all the time  Allergic/Immunologic: Negative.   Neurological: Negative.   Hematological: Negative.   Psychiatric/Behavioral: Negative.        Objective:   Physical Exam  Constitutional: She is oriented to person, place, and time. She appears well-developed and well-nourished.  Patient is alert tiny and somewhat kyphotic in appearance.  HENT:  Head: Normocephalic and atraumatic.  Right Ear: External ear normal.  Left Ear: External ear normal.  Nose: Nose normal.  Mouth/Throat: Oropharynx is clear and moist.  Eyes: Conjunctivae and EOM are normal. Pupils are  equal, round, and reactive to light. Right eye exhibits no discharge. Left eye exhibits no discharge. No scleral icterus.  Neck: Normal range of motion. Neck supple. No thyromegaly present.  No thyroid enlargement and no anterior cervical adenopathy  Cardiovascular: Normal rate and normal heart sounds.   No murmur heard. Pedal pulses were difficult to palpate and there was no edema. The heart is slightly irregular at 6 72/m with an occasional PVC.    Pulmonary/Chest: Effort normal and breath sounds normal. No respiratory distress. She has no wheezes. She has no rales. She exhibits no tenderness.  Abdominal: Soft. Bowel sounds are normal. She exhibits no mass. There is no tenderness. There is no rebound and no guarding.  There was no epigastric are suprapubic tenderness  Musculoskeletal: Normal range of motion. She exhibits no edema or tenderness.  Thoracic kyphosis  Lymphadenopathy:    She has no cervical adenopathy.  Neurological: She is alert and oriented to person, place, and time. She has normal reflexes. No cranial nerve deficit.  Skin: Skin is warm and dry.  Generally dry skin all over.  Psychiatric: She has a normal mood and affect. Her behavior is normal. Judgment and thought content normal.  Nursing note and vitals reviewed.   BP 105/74 mmHg  Pulse 65  Temp(Src) 96.9 F (36.1 C) (Oral)  Ht 5' 2"  (1.575 m)  Wt 107 lb (48.535 kg)  BMI 19.57 kg/m2       Assessment & Plan:  1. Gastroesophageal reflux disease, esophagitis presence not specified -She is having no problems with this at the present time and she should continue with her current treatment. - POCT CBC  2. Vitamin D deficiency -M.D. level has been good and she should continue with current treatment until lab work is returned - POCT CBC - Vit D  25 hydroxy (rtn osteoporosis monitoring)  3. Hyperlipemia -Her recent cholesterol numbers in October were good and she should continue with atorvastatin and current therapeutic lifestyle changes - POCT CBC - NMR, lipoprofile  4. Hypothyroidism, unspecified hypothyroidism type -Continue with current thyroid medication and adjustments will be made when lab work is returned - POCT CBC - Thyroid Panel With TSH  5. Essential hypertension -Blood pressure is good today, continue current treatment - POCT CBC - BMP8+EGFR - Hepatic function panel  6. Vaginal irritation -Cause of the persistent vaginal irritation  without good response from treatment set have been rendered by this office she will be referred to the gynecologist for further evaluation and treatment - Ambulatory referral to Gynecology  7. Screening exam for skin cancer -No abnormal lesions were noted. - Ambulatory referral to Dermatology  8. Screening for osteoporosis -The patient is kyphotic and is due for her DEXA scan and this will be arranged. -She will continue her current vitamin D and calcium. - DG Bone Density; Future  9. Dry skin dermatitis -Liquor carbonis detergens--this will be tried in the bath water after soaking for 5-10 minutes if we can locate it at a pharmacy  10. Low TSH level -Adjustments with thyroid medicine will be made when lab work is returned  Meds ordered this encounter  Medications  . oxazepam (SERAX) 15 MG capsule    Sig: TAKE ONE (1) CAPSULE THREE (3) TIMES EACH DAY    Dispense:  90 capsule    Refill:  2   Patient Instructions                       Medicare Annual  Wellness Visit  [facility] and the medical providers at [facility] strive to bring you the best medical care.  In doing so we not only want to address your current medical conditions and concerns but also to detect new conditions early and prevent illness, disease and health-related problems.    Medicare offers a yearly Wellness Visit which allows our clinical staff to assess your need for preventative services including immunizations, lifestyle education, counseling to decrease risk of preventable diseases and screening for fall risk and other medical concerns.    This visit is provided free of charge (no copay) for all Medicare recipients. The clinical pharmacists at [facility] have begun to conduct these Wellness Visits which will also include a thorough review of all your medications.    As you primary medical provider recommend that you make an appointment for your Annual Wellness  Visit if you have not done so already this year.  You may set up this appointment before you leave today or you may call back ([phone]) and schedule an appointment.  Please make sure when you call that you mention that you are scheduling your Annual Wellness Visit with the clinical pharmacist so that the appointment may be made for the proper length of time.     Continue current medications. Continue good therapeutic lifestyle changes which include good diet and exercise. Fall precautions discussed with patient. If an FOBT was given today- please return it to our front desk. If you are over 58 years old - you may need Prevnar 67 or the adult Pneumonia vaccine.  Flu Shots are still available at our office. If you still haven't had one please call to set up a nurse visit to get one.   After your visit with [place] today you will receive a survey in the mail or online from [employer] regarding your care with [place]. Please take a moment to fill this out. Your feedback is very important to [place] as you can help [place] better understand your patient needs as well as improve your experience and satisfaction. WE CARE ABOUT YOU!!!   Continue to use hydrating lotions Continue to avoid scented soap, fabric softener, and detergent Keep the house as cool as possible and use a cool mist humidifier Use the hydrating medication by using a couple capfuls in a tub of water after soaking for 5-10 minutes We will also arrange for you to have an appointment with the gynecologist because of your ongoing problems with vaginal itching and irritation   Arrie Senate MD

## 2014-11-27 NOTE — Telephone Encounter (Signed)
Dr Laurance Flatten had suggested for the pt to have "liquor carbonous detergen" for her to put into her bath water for dry skin  After speaking with the clinical pharm. --- we found out that   Mize T-GEL otc (with the shampoo) Is the same thing.   She can put a cap-full in her bath water to see if it helps with dry skin

## 2014-11-28 LAB — NMR, LIPOPROFILE
CHOLESTEROL: 142 mg/dL (ref 100–199)
HDL CHOLESTEROL BY NMR: 51 mg/dL (ref >39)
HDL PARTICLE NUMBER: 36.4 umol/L (ref >=30.5)
LDL Particle Number: 763 nmol/L (ref <1000)
LDL SIZE: 20.4 nm (ref >20.5)
LDL-C: 65 mg/dL (ref 0–99)
LP-IR Score: 57 — ABNORMAL HIGH (ref <=45)
Small LDL Particle Number: 447 nmol/L (ref <=527)
Triglycerides by NMR: 131 mg/dL (ref 0–149)

## 2014-11-28 LAB — THYROID PANEL WITH TSH
FREE THYROXINE INDEX: 2.9 (ref 1.2–4.9)
T3 Uptake Ratio: 27 % (ref 24–39)
T4, Total: 10.6 ug/dL (ref 4.5–12.0)
TSH: 0.026 u[IU]/mL — ABNORMAL LOW (ref 0.450–4.500)

## 2014-11-28 LAB — VITAMIN D 25 HYDROXY (VIT D DEFICIENCY, FRACTURES): Vit D, 25-Hydroxy: 45.2 ng/mL (ref 30.0–100.0)

## 2014-11-28 LAB — BMP8+EGFR
BUN/Creatinine Ratio: 19 (ref 11–26)
BUN: 19 mg/dL (ref 8–27)
CALCIUM: 10 mg/dL (ref 8.7–10.3)
CO2: 29 mmol/L (ref 18–29)
CREATININE: 0.99 mg/dL (ref 0.57–1.00)
Chloride: 97 mmol/L (ref 97–108)
GFR, EST AFRICAN AMERICAN: 64 mL/min/{1.73_m2} (ref >59)
GFR, EST NON AFRICAN AMERICAN: 56 mL/min/{1.73_m2} — AB (ref >59)
GLUCOSE: 107 mg/dL — AB (ref 65–99)
Potassium: 3.4 mmol/L — ABNORMAL LOW (ref 3.5–5.2)
Sodium: 142 mmol/L (ref 134–144)

## 2014-11-28 LAB — HEPATIC FUNCTION PANEL
ALK PHOS: 103 IU/L (ref 39–117)
ALT: 9 IU/L (ref 0–32)
AST: 19 IU/L (ref 0–40)
Albumin: 4.4 g/dL (ref 3.5–4.8)
BILIRUBIN, DIRECT: 0.15 mg/dL (ref 0.00–0.40)
Bilirubin Total: 0.5 mg/dL (ref 0.0–1.2)
Total Protein: 7.2 g/dL (ref 6.0–8.5)

## 2014-11-28 NOTE — Telephone Encounter (Signed)
Need to discuss labs

## 2014-11-28 NOTE — Telephone Encounter (Signed)
-----   Message from Chipper Herb, MD sent at 11/28/2014  7:14 AM EST ----- The blood sugar is slightly elevated at 107. The creatinine, the most important kidney function test is within normal limits. The potassium is slightly decreased at 3.4.------ if she is taking 3 teaspoons of potassium liquid daily and please confirm this, she should increase this to 4 teaspoons daily and she should have her potassium rechecked in a couple of weeks. All cholesterol numbers with advanced lipid testing are excellent and at goal-----she should continue with her atorvastatin as she is doing and with aggressive therapeutic lifestyle changes which include diet and exercise The vitamin D level is good at 45.2, continue with current treatment which is 5000 units daily of D3 All liver function tests are within normal limits The TSH remains low but not as low as it was 3 months ago. This means that she is getting too much thyroid medication and we will need to reduce this further. If she is taking 100 g daily, we should reduce this and have her take 100 daily Monday through Saturday and take a half a one on Sunday------- the thyroid profile should be rechecked in 6 weeks, she does not have to be seen nor does she have to be fasting.

## 2014-12-02 ENCOUNTER — Other Ambulatory Visit: Payer: Self-pay | Admitting: Family Medicine

## 2014-12-13 ENCOUNTER — Telehealth: Payer: Self-pay | Admitting: Family Medicine

## 2014-12-16 DIAGNOSIS — N9089 Other specified noninflammatory disorders of vulva and perineum: Secondary | ICD-10-CM | POA: Diagnosis not present

## 2014-12-16 DIAGNOSIS — N898 Other specified noninflammatory disorders of vagina: Secondary | ICD-10-CM | POA: Diagnosis not present

## 2014-12-18 ENCOUNTER — Encounter: Payer: Self-pay | Admitting: Pharmacist

## 2014-12-18 ENCOUNTER — Ambulatory Visit (INDEPENDENT_AMBULATORY_CARE_PROVIDER_SITE_OTHER): Payer: Commercial Managed Care - HMO | Admitting: Pharmacist

## 2014-12-18 ENCOUNTER — Ambulatory Visit (INDEPENDENT_AMBULATORY_CARE_PROVIDER_SITE_OTHER): Payer: Commercial Managed Care - HMO

## 2014-12-18 ENCOUNTER — Other Ambulatory Visit: Payer: Self-pay | Admitting: Family Medicine

## 2014-12-18 VITALS — Ht 62.0 in | Wt 107.5 lb

## 2014-12-18 DIAGNOSIS — M8000XD Age-related osteoporosis with current pathological fracture, unspecified site, subsequent encounter for fracture with routine healing: Secondary | ICD-10-CM

## 2014-12-18 DIAGNOSIS — Z780 Asymptomatic menopausal state: Secondary | ICD-10-CM

## 2014-12-18 MED ORDER — DENOSUMAB 60 MG/ML ~~LOC~~ SOLN: 60.0000 mg | Freq: Once | SUBCUTANEOUS | Status: DC

## 2014-12-18 NOTE — Progress Notes (Signed)
Patient ID: [name], female   DOB: Dec 14, 1940, 74 y.o.   MRN: [id number]  Osteoporosis Clinic Current Height: Height: 5\' 2"  (157.5 cm)      Max Lifetime Height:  5\' 4"  Current Weight: Weight: 107 lb 8 oz (48.762 kg)       Ethnicity:Caucasian    HPI: Patient with osteoporosis and history of compression fractures diagnosed by xray  Back Pain?  Yes       Kyphosis?  Yes Prior fracture?  Yes - ribs and compression fractures Med(s) for Osteoporosis/Osteopenia:  none Med(s) previously tried for Osteoporosis/Osteopenia:  Forteo for 2 years 2009 - 2011;  Cannot take oral bisphosphnates due to esophageal problems.  Looked into both Prolia and Reclast at last visit and patient could not afford either of these options.  Tried Miacalcin NS but caused nose bleeds.    Patient now has extra help with prescription mediation.                                                             PMH: Age at menopause:  74 yo surgical Hysterectomy?  Yes Oophorectomy?  Yes HRT? No Steroid Use?  No Thyroid med?  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  Yes - espohagitis and difficulty swallowing Current or previous eating disorders?  No Last Vitamin D Result:  45.2 (11/28/2014) Last GFR Result:  56 (11/28/2014)   FH/SH: Family history of osteoporosis?  Yes - sister Parent with history of hip fracture?  No Family history of breast cancer?  Yes - sister Exercise?  No - broke ribs on recumbent bike Smoking?  No Alcohol?  No    Calcium Assessment Calcium Intake  # of servings/day  Calcium mg  Milk (8 oz)/OJ 1  x  300  = 300mg   Yogurt (4 oz) 0 x  200 = 0  Cheese (1 oz) 1 x  200 = 200mg   Other Calcium sources   250mg   Ca supplement MVI = 400mg    Estimated calcium intake per day 1150mg     DEXA Results Date of Test T-Score for AP Spine L1-L4 T-Score for Total Left Hip T-Score for Total Right Hip  12/18/2014 -5.5 -4.6 -4.7  08/30/2012 -5.1 -4.6 -4.7   08/12/2010 -4.4 -4.2 -3.8  01/15/2009 -4.5 -4.1 -4.1   Assessment: Osteoporosis with significant decrease in BMD  Recommendations: 1.  Start  prolia 60mg  SQ q 6 months - looking into insurance coverage 2.  recommend calcium 1200mg  daily through supplementation or diet.  3.  recommend weight bearing exercise - walking only due to history of fracture with recumbant bke 4.  Counseled and educated about fall risk and prevention.  Recheck DEXA:  2 years  Time spent counseling patient:  30 minutes   Cherre Robins, PharmD, CPP

## 2014-12-18 NOTE — Patient Instructions (Addendum)
Calcium citrate - gummies.  Checking into insurance coverage of Prolia and Reclast for osteoporosis and bones.                Exercise for Strong Bones  Exercise is important to build and maintain strong bones / bone density.  There are 2 types of exercises that are important to building and maintaining strong bones:  Weight- bearing and muscle-stregthening.  Weight-bearing Exercises  These exercises include activities that make you move against gravity while staying upright. Weight-bearing exercises can be high-impact or low-impact.  High-impact weight-bearing exercises help build bones and keep them strong. If you have broken a bone due to osteoporosis or are at risk of breaking a bone, you may need to avoid high-impact exercises. If you're not sure, you should check with your healthcare provider.  Examples of high-impact weight-bearing exercises are: Dancing  Doing high-impact aerobics  Hiking  Jogging/running  Jumping Rope  Stair climbing  Tennis  Low-impact weight-bearing exercises can also help keep bones strong and are a safe alternative if you cannot do high-impact exercises.   Examples of low-impact weight-bearing exercises are: Using elliptical training machines  Doing low-impact aerobics  Using stair-step machines  Fast walking on a treadmill or outside   Muscle-Strengthening Exercises These exercises include activities where you move your body, a weight or some other resistance against gravity. They are also known as resistance exercises and include: Lifting weights  Using elastic exercise bands  Using weight machines  Lifting your own body weight  Functional movements, such as standing and rising up on your toes  Yoga and Pilates can also improve strength, balance and flexibility. However, certain positions may not be safe for people with osteoporosis or those at increased risk of broken bones. For example, exercises that have you bend forward may increase the  chance of breaking a bone in the spine.   Non-Impact Exercises There are other types of exercises that can help prevent falls.  Non-impact exercises can help you to improve balance, posture and how well you move in everyday activities. Some of these exercises include: Balance exercises that strengthen your legs and test your balance, such as Tai Chi, can decrease your risk of falls.  Posture exercises that improve your posture and reduce rounded or "sloping" shoulders can help you decrease the chance of breaking a bone, especially in the spine.  Functional exercises that improve how well you move can help you with everyday activities and decrease your chance of falling and breaking a bone. For example, if you have trouble getting up from a chair or climbing stairs, you should do these activities as exercises.   **A physical therapist can teach you balance, posture and functional exercises. He/she can also help you learn which exercises are safe and appropriate for you.  [facility] has a physical therapy office in [place] in front of our office and referrals can be made for assessments and treatment as needed and strength and balance training.  If you would like to have an assessment with [facility] and our physical therapy team please let a nurse or provider know.   Calcium & Vitamin D: The Facts  Why is calcium and vitamin D consumption important? Calcium: . Most Americans do not consume adequate amounts of calcium! Calcium is required for proper muscle function, nerve communication, bone support, and many other functions in the body.  . The body uses bones as a source of calcium. Bones 'remodel' themselves continuously - the body constantly breaks  bone down to release calcium and rebuilds bones by replacing calcium in the bone later.  . As we get older, the rate of bone breakdown occurs faster than bone rebuilding which could lead to osteopenia, osteoporosis, and possible fractures.   Vitamin  D: . People naturally make vitamin D in the body when sunlight hits the skin and triggers a process that leads to vitamin D production. This natural vitamin D production requires about 10-15 minutes of sun exposure on the hands, arms, and face at least 2-3 times per week. However, due to decreased sun exposure and the use of sunscreen, most people will need to get additional vitamin D from foods or supplements. Your doctor can measure your body's vitamin D level through a simple blood test to determine your daily vitamin D needs.  . Vitamin D is used to help the body absorb calcium, maintain bone health, help the immune system, and reduce inflammation. It also plays a role in muscle performance, balance and risk of falling.  . Vitamin D deficiency can lead to osteomalacia or softening of the bones, bone pain, and muscle weakness.   The recommended daily allowance of Calcium and Vitamin D varies for different age groups. Age group Calcium (mg) Vitamin D (IU)  Females and Males: Age 32-50 1000 mg 600 IU  Females: Age 29- 2 1200 mg 600 IU  Males: Age 70-70 1000 mg 600 IU  Females and Males: Age 12+ 1200 mg 800 IU  Pregnant/lactating Females age 41-50 1000 mg 600 IU   How much Calcium do you get in your diet? Calcium Intake # of servings per day  Total calcium (mg)  Skim milk, 2% milk (1 cup) _________ x 300 mg   Yogurt (1 small container) _________ x 200 mg   Cheese (1oz) _________ x 200 mg   Cottage Cheese (1 cup)             ________ x 150 mg   Almond milk (1 cup) _________ x 450 mg   Fortified Orange Juice (1 cup) _________ x 300 mg   Broccoli or spinach ( 1 cup) _________ x 100 mg   Salmon (3 oz) _________ x 150 mg    Almonds (1/4 cup) _______ x 90 mg      How do we get Calcium and Vitamin D in our diet? Calcium: . Obtaining calcium from the diet is the most preferred way to reach the recommended daily goal. If this goal is not reached through diet, calcium supplements are available.   . Calcium is found in many foods including: dairy products, dark leafy vegetables (like broccoli, kale, and spinach), fish, and fortified products like juices and cereals.  . The food label will have a %DV (percent daily value) listed showing the amount of calcium per serving. To determine the total mg per serving, simply replace the % with zero (0).  For example, Almond Breeze almond milk contains 45% DV of calcium or 44m per 1 cup.  . You can increase the amount of calcium in your diet by using more calcium products in your daily meals. Use yogurt and fruit to make smoothies or use yogurt to top baked potatoes or make whipped potatoes. Sprinkle low fat cheese onto salads or into egg white omelets. You can even add non-fat dry milk powder (3041mcalcium per 1/3 cup) to hot cereals, meat loaf, soups, or potatoes.  . Calcium supplements come in many forms including tablets, chewables, and gummies. Be sure to read the  label to determine the correct number of tablets per serving and whether or not to take the supplement with food.  . Calcium carbonate products (Oscal, Caltrate, and Viactiv) are generally better absorbed when taken with food while calcium citrate products like Citracal can be taken with or without food.  . The body can only absorb about 600 mg of calcium at one time. It is recommended to take calcium supplements in small amounts several times per day.  However, taking it all at once is better than not taking it at all. . Increasing your intake of calcium is essential for bone health, but may also lead to some side effects like constipation, increased gas, bloating or abdominal cramping. To help reduce these side effects, start with 1 tablet per day and slowly increase your intake of the supplement to the recommended doses. It is also recommended that you drink plenty of water each day. Vitamin D: . Very few foods naturally contain vitamin D. However, it is found in saltwater fish (like  tuna, salmon and mackerel), beef liver, egg yolks, cheese and vitamin D fortified foods (like yogurt, cereals, orange juice and milk) . The amount of vitamin D in each food or product is listed as %DV on the product label. To determine the total amount of vitamin D per serving, drop the % sign and multiply the number by 4. For example, 1 cup of Almond Breeze almond milk contains 25% DV vitamin D or 100 IU per serving (25 x 4 =100). . Vitamin D is also found in multivitamins and supplements and may be listed as ergocalciferol (vitamin D2) or cholecalciferol (vitamin D3). Each of these forms of vitamin D are equivalent and the daily recommended intake will vary based on your age and the vitamin D levels in your body. Follow your doctor's recommendation for vitamin D intake.       Fall Prevention and Home Safety Falls cause injuries and can affect all age groups. It is possible to use preventive measures to significantly decrease the likelihood of falls. There are many simple measures which can make your home safer and prevent falls. OUTDOORS  Repair cracks and edges of walkways and driveways.  Remove high doorway thresholds.  Trim shrubbery on the main path into your home.  Have good outside lighting.  Clear walkways of tools, rocks, debris, and clutter.  Check that handrails are not broken and are securely fastened. Both sides of steps should have handrails.  Have leaves, snow, and ice cleared regularly.  Use sand or salt on walkways during winter months.  In the garage, clean up grease or oil spills. BATHROOM  Install night lights.  Install grab bars by the toilet and in the tub and shower.  Use non-skid mats or decals in the tub or shower.  Place a plastic non-slip stool in the shower to sit on, if needed.  Keep floors dry and clean up all water on the floor immediately.  Remove soap buildup in the tub or shower on a regular basis.  Secure bath mats with non-slip,  double-sided rug tape.  Remove throw rugs and tripping hazards from the floors. BEDROOMS  Install night lights.  Make sure a bedside light is easy to reach.  Do not use oversized bedding.  Keep a telephone by your bedside.  Have a firm chair with side arms to use for getting dressed.  Remove throw rugs and tripping hazards from the floor. KITCHEN  Keep handles on pots and pans turned toward  the center of the stove. Use back burners when possible.  Clean up spills quickly and allow time for drying.  Avoid walking on wet floors.  Avoid hot utensils and knives.  Position shelves so they are not too high or low.  Place commonly used objects within easy reach.  If necessary, use a sturdy step stool with a grab bar when reaching.  Keep electrical cables out of the way.  Do not use floor polish or wax that makes floors slippery. If you must use wax, use non-skid floor wax.  Remove throw rugs and tripping hazards from the floor. STAIRWAYS  Never leave objects on stairs.  Place handrails on both sides of stairways and use them. Fix any loose handrails. Make sure handrails on both sides of the stairways are as long as the stairs.  Check carpeting to make sure it is firmly attached along stairs. Make repairs to worn or loose carpet promptly.  Avoid placing throw rugs at the top or bottom of stairways, or properly secure the rug with carpet tape to prevent slippage. Get rid of throw rugs, if possible.  Have an electrician put in a light switch at the top and bottom of the stairs. OTHER FALL PREVENTION TIPS  Wear low-heel or rubber-soled shoes that are supportive and fit well. Wear closed toe shoes.  When using a stepladder, make sure it is fully opened and both spreaders are firmly locked. Do not climb a closed stepladder.  Add color or contrast paint or tape to grab bars and handrails in your home. Place contrasting color strips on first and last steps.  Learn and use  mobility aids as needed. Install an electrical emergency response system.  Turn on lights to avoid dark areas. Replace light bulbs that burn out immediately. Get light switches that glow.  Arrange furniture to create clear pathways. Keep furniture in the same place.  Firmly attach carpet with non-skid or double-sided tape.  Eliminate uneven floor surfaces.  Select a carpet pattern that does not visually hide the edge of steps.  Be aware of all pets. OTHER HOME SAFETY TIPS  Set the water temperature for 120 F (48.8 C).  Keep emergency numbers on or near the telephone.  Keep smoke detectors on every level of the home and near sleeping areas. Document Released: 09/24/2002 Document Revised: 04/04/2012 Document Reviewed: 12/24/2011 [facility] Patient Information 2015 [place], [place]. This information is not intended to replace advice given to you by your health care provider. Make sure you discuss any questions you have with your health care provider.

## 2014-12-30 ENCOUNTER — Other Ambulatory Visit (INDEPENDENT_AMBULATORY_CARE_PROVIDER_SITE_OTHER): Payer: Commercial Managed Care - HMO

## 2014-12-30 DIAGNOSIS — R7989 Other specified abnormal findings of blood chemistry: Secondary | ICD-10-CM | POA: Diagnosis not present

## 2014-12-30 DIAGNOSIS — R799 Abnormal finding of blood chemistry, unspecified: Secondary | ICD-10-CM | POA: Diagnosis not present

## 2014-12-31 ENCOUNTER — Other Ambulatory Visit: Payer: Self-pay

## 2014-12-31 LAB — THYROID PANEL WITH TSH
FREE THYROXINE INDEX: 3.9 (ref 1.2–4.9)
T3 UPTAKE RATIO: 29 % (ref 24–39)
T4 TOTAL: 13.5 ug/dL — AB (ref 4.5–12.0)
TSH: 0.013 u[IU]/mL — AB (ref 0.450–4.500)

## 2014-12-31 LAB — POTASSIUM: POTASSIUM: 4 mmol/L (ref 3.5–5.2)

## 2014-12-31 MED ORDER — LEVOTHYROXINE SODIUM 100 MCG PO TABS: 100.0000 ug | ORAL_TABLET | Freq: Every day | ORAL | Status: DC

## 2015-01-01 ENCOUNTER — Encounter: Payer: Self-pay | Admitting: Family

## 2015-01-01 ENCOUNTER — Ambulatory Visit (INDEPENDENT_AMBULATORY_CARE_PROVIDER_SITE_OTHER): Payer: Commercial Managed Care - HMO | Admitting: Family

## 2015-01-01 VITALS — BP 126/79 | HR 119 | Temp 98.6°F | Ht 62.0 in | Wt 107.0 lb

## 2015-01-01 DIAGNOSIS — R059 Cough, unspecified: Secondary | ICD-10-CM

## 2015-01-01 DIAGNOSIS — R05 Cough: Secondary | ICD-10-CM | POA: Diagnosis not present

## 2015-01-01 DIAGNOSIS — J069 Acute upper respiratory infection, unspecified: Secondary | ICD-10-CM

## 2015-01-01 MED ORDER — GUAIFENESIN 100 MG/5ML PO SOLN: 5.0000 mL | ORAL | Status: DC | PRN

## 2015-01-01 MED ORDER — BENZONATATE 200 MG PO CAPS: 200.0000 mg | ORAL_CAPSULE | Freq: Three times a day (TID) | ORAL | Status: DC | PRN

## 2015-01-01 MED ORDER — AMOXICILLIN-POT CLAVULANATE 875-125 MG PO TABS: 1.0000 | ORAL_TABLET | Freq: Two times a day (BID) | ORAL | Status: DC

## 2015-01-01 NOTE — Patient Instructions (Addendum)
Upper Respiratory Infection, Adult An upper respiratory infection (URI) is also sometimes known as the common cold. The upper respiratory tract includes the nose, sinuses, throat, trachea, and bronchi. Bronchi are the airways leading to the lungs. Most people improve within 1 week, but symptoms can last up to 2 weeks. A residual cough may last even longer.  CAUSES Many different viruses can infect the tissues lining the upper respiratory tract. The tissues become irritated and inflamed and often become very moist. Mucus production is also common. A cold is contagious. You can easily spread the virus to others by oral contact. This includes kissing, sharing a glass, coughing, or sneezing. Touching your mouth or nose and then touching a surface, which is then touched by another person, can also spread the virus. SYMPTOMS  Symptoms typically develop 1 to 3 days after you come in contact with a cold virus. Symptoms vary from person to person. They may include:  Runny nose.  Sneezing.  Nasal congestion.  Sinus irritation.  Sore throat.  Loss of voice (laryngitis).  Cough.  Fatigue.  Muscle aches.  Loss of appetite.  Headache.  Low-grade fever. DIAGNOSIS  You might diagnose your own cold based on familiar symptoms, since most people get a cold 2 to 3 times a year. Your caregiver can confirm this based on your exam. Most importantly, your caregiver can check that your symptoms are not due to another disease such as strep throat, sinusitis, pneumonia, asthma, or epiglottitis. Blood tests, throat tests, and X-rays are not necessary to diagnose a common cold, but they may sometimes be helpful in excluding other more serious diseases. Your caregiver will decide if any further tests are required. RISKS AND COMPLICATIONS  You may be at risk for a more severe case of the common cold if you smoke cigarettes, have chronic heart disease (such as heart failure) or lung disease (such as asthma), or if  you have a weakened immune system. The very young and very old are also at risk for more serious infections. Bacterial sinusitis, middle ear infections, and bacterial pneumonia can complicate the common cold. The common cold can worsen asthma and chronic obstructive pulmonary disease (COPD). Sometimes, these complications can require emergency medical care and may be life-threatening. PREVENTION  The best way to protect against getting a cold is to practice good hygiene. Avoid oral or hand contact with people with cold symptoms. Wash your hands often if contact occurs. There is no clear evidence that vitamin C, vitamin E, echinacea, or exercise reduces the chance of developing a cold. However, it is always recommended to get plenty of rest and practice good nutrition. TREATMENT  Treatment is directed at relieving symptoms. There is no cure. Antibiotics are not effective, because the infection is caused by a virus, not by bacteria. Treatment may include:  Increased fluid intake. Sports drinks offer valuable electrolytes, sugars, and fluids.  Breathing heated mist or steam (vaporizer or shower).  Eating chicken soup or other clear broths, and maintaining good nutrition.  Getting plenty of rest.  Using gargles or lozenges for comfort.  Controlling fevers with ibuprofen or acetaminophen as directed by your caregiver.  Increasing usage of your inhaler if you have asthma. Zinc gel and zinc lozenges, taken in the first 24 hours of the common cold, can shorten the duration and lessen the severity of symptoms. Pain medicines may help with fever, muscle aches, and throat pain. A variety of non-prescription medicines are available to treat congestion and runny nose. Your caregiver   can make recommendations and may suggest nasal or lung inhalers for other symptoms.  HOME CARE INSTRUCTIONS   Only take over-the-counter or prescription medicines for pain, discomfort, or fever as directed by your  caregiver.  Use a warm mist humidifier or inhale steam from a shower to increase air moisture. This may keep secretions moist and make it easier to breathe.  Drink enough water and fluids to keep your urine clear or pale yellow.  Rest as needed.  Return to work when your temperature has returned to normal or as your caregiver advises. You may need to stay home longer to avoid infecting others. You can also use a face mask and careful hand washing to prevent spread of the virus. SEEK MEDICAL CARE IF:   After the first few days, you feel you are getting worse rather than better.  You need your caregiver's advice about medicines to control symptoms.  You develop chills, worsening shortness of breath, or brown or red sputum. These may be signs of pneumonia.  You develop yellow or brown nasal discharge or pain in the face, especially when you bend forward. These may be signs of sinusitis.  You develop a fever, swollen neck glands, pain with swallowing, or white areas in the back of your throat. These may be signs of strep throat. SEEK IMMEDIATE MEDICAL CARE IF:   You have a fever.  You develop severe or persistent headache, ear pain, sinus pain, or chest pain.  You develop wheezing, a prolonged cough, cough up blood, or have a change in your usual mucus (if you have chronic lung disease).  You develop sore muscles or a stiff neck. Document Released: 03/30/2001 Document Revised: 12/27/2011 Document Reviewed: 01/09/2014 [employer] Patient Information 2015 [employer]. This information is not intended to replace advice given to you by your health care provider. Make sure you discuss any questions you have with your health care provider.  - Take meds as prescribed - Use a cool mist humidifier  -Use saline nose sprays frequently -Saline irrigations of the nose can be very helpful if done frequently.  * 4X daily for 1 week*  * Use of a nettie pot can be helpful with this. Follow  directions with this* -Force fluids -For any cough or congestion  Use plain Mucinex- regular strength or max strength is fine   * Children- consult with Pharmacist for dosing -For fever or aces or pains- take tylenol or ibuprofen appropriate for age and weight.  * for fevers greater than 101 orally you may alternate ibuprofen and tylenol every  3 hours. -Throat lozenges if help   Rosealynn Mateus, FNP   

## 2015-01-01 NOTE — Progress Notes (Signed)
Subjective:    Patient ID: [name], female    DOB: [birth year], 74 y.o.   MRN: [id number]  Sinusitis This is a new problem. The current episode started 1 to 4 weeks ago (12/21/14). The problem is unchanged. There has been no fever. Her pain is at a severity of 7/10. Associated symptoms include congestion, coughing, ear pain, headaches, a hoarse voice, sinus pressure and sneezing. Pertinent negatives include no shortness of breath, sore throat or swollen glands. Past treatments include acetaminophen and oral decongestants. The treatment provided mild relief.  Headache  Associated symptoms include coughing, ear pain and sinus pressure. Pertinent negatives include no sore throat or swollen glands.      Review of Systems  Constitutional: Negative.   HENT: Positive for congestion, ear pain, hoarse voice, sinus pressure and sneezing. Negative for sore throat.   Eyes: Negative.   Respiratory: Positive for cough. Negative for shortness of breath.   Cardiovascular: Negative.  Negative for palpitations.  Gastrointestinal: Negative.   Endocrine: Negative.   Genitourinary: Negative.   Musculoskeletal: Negative.   Neurological: Positive for headaches.  Hematological: Negative.   Psychiatric/Behavioral: Negative.   All other systems reviewed and are negative.      Objective:   Physical Exam  Constitutional: She is oriented to person, place, and time. She appears well-developed and well-nourished. No distress.  HENT:  Head: Normocephalic and atraumatic.  Right Ear: External ear normal.  Left Ear: External ear normal.  Nasal passage erythemas with mild swelling  Oropharynx erythemas   Eyes: Pupils are equal, round, and reactive to light.  Neck: Normal range of motion. Neck supple. No thyromegaly present.  Cardiovascular: Normal rate, regular rhythm, normal heart sounds and intact distal pulses.   No murmur heard. Pulmonary/Chest: Effort normal and breath sounds normal. No  respiratory distress. She has no wheezes.  Abdominal: Soft. Bowel sounds are normal. She exhibits no distension. There is no tenderness.  Musculoskeletal: Normal range of motion. She exhibits no edema or tenderness.  Neurological: She is alert and oriented to person, place, and time. She has normal reflexes. No cranial nerve deficit.  Skin: Skin is warm and dry.  Psychiatric: She has a normal mood and affect. Her behavior is normal. Judgment and thought content normal.  Vitals reviewed.     BP 126/79 mmHg  Pulse 119  Temp(Src) 98.6 F (37 C) (Oral)  Ht 5\' 2"  (1.575 m)  Wt 107 lb (48.535 kg)  BMI 19.57 kg/m2     Assessment & Plan:  1. Acute upper respiratory infection -- Take meds as prescribed - Use a cool mist humidifier  -Use saline nose sprays frequently -Saline irrigations of the nose can be very helpful if done frequently.  * 4X daily for 1 week*  * Use of a nettie pot can be helpful with this. Follow directions with this* -Force fluids -For any cough or congestion  Use plain Mucinex- regular strength or max strength is fine   * Children- consult with Pharmacist for dosing -For fever or aces or pains- take tylenol or ibuprofen appropriate for age and weight.  * for fevers greater than 101 orally you may alternate ibuprofen and tylenol every  3 hours. -Throat lozenges if help - amoxicillin-clavulanate (AUGMENTIN) 875-125 MG per tablet; Take 1 tablet by mouth 2 (two) times daily.  Dispense: 14 tablet; Refill: 0 - benzonatate (TESSALON) 200 MG capsule; Take 1 capsule (200 mg total) by mouth 3 (three) times daily as needed.  Dispense: 30 capsule; Refill:  1 - guaiFENesin (ROBITUSSIN) 100 MG/5ML SOLN; Take 5 mLs (100 mg total) by mouth every 4 (four) hours as needed for cough or to loosen phlegm.  Dispense: 1200 mL; Refill: 0  2. Cough - benzonatate (TESSALON) 200 MG capsule; Take 1 capsule (200 mg total) by mouth 3 (three) times daily as needed.  Dispense: 30 capsule; Refill:  [place], FNP

## 2015-02-04 ENCOUNTER — Other Ambulatory Visit: Payer: Self-pay | Admitting: Family Medicine

## 2015-02-05 NOTE — Telephone Encounter (Signed)
Last filled 01/01/15, last seen 11/27/14. [facility]

## 2015-02-10 ENCOUNTER — Other Ambulatory Visit (INDEPENDENT_AMBULATORY_CARE_PROVIDER_SITE_OTHER): Payer: Commercial Managed Care - HMO

## 2015-02-10 DIAGNOSIS — E039 Hypothyroidism, unspecified: Secondary | ICD-10-CM

## 2015-02-10 NOTE — Progress Notes (Signed)
Lab only 

## 2015-02-11 LAB — THYROID PANEL WITH TSH
FREE THYROXINE INDEX: 4.2 (ref 1.2–4.9)
T3 UPTAKE RATIO: 32 % (ref 24–39)
T4, Total: 13.2 ug/dL — ABNORMAL HIGH (ref 4.5–12.0)
TSH: 0.013 u[IU]/mL — ABNORMAL LOW (ref 0.450–4.500)

## 2015-02-25 DIAGNOSIS — L570 Actinic keratosis: Secondary | ICD-10-CM | POA: Diagnosis not present

## 2015-02-27 NOTE — Progress Notes (Signed)
Patient ID: [name], female   DOB: 1941-04-08, 74 y.o.   MRN: [id number]  Patient's cost for Prolia was $97.  She states she is unable to afford this high of a copay.  She was denied help from the safety net foundation. She is also not able to afford any copy associated with getting Reclast.

## 2015-03-31 ENCOUNTER — Ambulatory Visit (INDEPENDENT_AMBULATORY_CARE_PROVIDER_SITE_OTHER): Payer: Commercial Managed Care - HMO | Admitting: Family Medicine

## 2015-03-31 ENCOUNTER — Encounter: Payer: Self-pay | Admitting: Family Medicine

## 2015-03-31 VITALS — BP 116/82 | HR 89 | Temp 97.8°F | Ht 62.0 in | Wt 105.0 lb

## 2015-03-31 DIAGNOSIS — I10 Essential (primary) hypertension: Secondary | ICD-10-CM

## 2015-03-31 DIAGNOSIS — F4322 Adjustment disorder with anxiety: Secondary | ICD-10-CM

## 2015-03-31 DIAGNOSIS — E039 Hypothyroidism, unspecified: Secondary | ICD-10-CM | POA: Diagnosis not present

## 2015-03-31 DIAGNOSIS — K219 Gastro-esophageal reflux disease without esophagitis: Secondary | ICD-10-CM | POA: Diagnosis not present

## 2015-03-31 DIAGNOSIS — E559 Vitamin D deficiency, unspecified: Secondary | ICD-10-CM

## 2015-03-31 DIAGNOSIS — E785 Hyperlipidemia, unspecified: Secondary | ICD-10-CM

## 2015-03-31 LAB — POCT CBC
GRANULOCYTE PERCENT: 70.3 % (ref 37–80)
HEMATOCRIT: 41.1 % (ref 37.7–47.9)
Hemoglobin: 12.9 g/dL (ref 12.2–16.2)
Lymph, poc: 2.3 (ref 0.6–3.4)
MCH, POC: 28.7 pg (ref 27–31.2)
MCHC: 31.5 g/dL — AB (ref 31.8–35.4)
MCV: 91.3 fL (ref 80–97)
MPV: 8.3 fL (ref 0–99.8)
PLATELET COUNT, POC: 247 10*3/uL (ref 142–424)
POC GRANULOCYTE: 6 (ref 2–6.9)
POC LYMPH PERCENT: 27 %L (ref 10–50)
RBC: 4.51 M/uL (ref 4.04–5.48)
RDW, POC: 13.2 %
WBC: 8.5 10*3/uL (ref 4.6–10.2)

## 2015-03-31 MED ORDER — OXAZEPAM 15 MG PO CAPS: ORAL_CAPSULE | ORAL | Status: DC

## 2015-03-31 NOTE — Progress Notes (Signed)
Subjective:    Patient ID: [name], female    DOB: May 08, 1941, 74 y.o.   MRN: [id number]  HPI Pt here for follow up and management of chronic medical problems which includes hypertension, hyperlipidemia, and hypothyroid. She is taking medications regularly. She is in good spirits today. She just moved from one apartment which she thinks had a lot of mold to a new and clean apartment and feels a lot better in this environment. He also says her husband feels better today. She denies chest pain or shortness of breath or symptoms of fatigue. She's having no problems with her GI tract and no problems with her urinary tract. She is calm and alert.       Patient Active Problem List   Diagnosis Date Noted  . Hyperlipemia 02/14/2013  . Hypothyroid 02/14/2013  . Vitamin D deficiency 02/14/2013  . Generalized anxiety disorder 02/14/2013  . Constipation   . Esophageal stricture   . Osteoporosis with pathological fracture   . GERD (gastroesophageal reflux disease)    Outpatient Encounter Prescriptions as of 03/31/2015  Medication Sig  . atorvastatin (LIPITOR) 80 MG tablet TAKE 1 TABLET EVERY DAY  . Cholecalciferol (VITAMIN D3) 5000 UNITS TABS Take 1 tablet by mouth daily.    Marland Kitchen denosumab (PROLIA) 60 MG/ML SOLN injection Inject 60 mg into the skin once. Bring to office for administration. Administer in upper arm, thigh, or abdomen  . escitalopram (LEXAPRO) 10 MG tablet Take 1 tablet (10 mg total) by mouth daily.  . hydrochlorothiazide (HYDRODIURIL) 25 MG tablet TAKE 1 TABLET EVERY DAY  . levothyroxine (SYNTHROID, LEVOTHROID) 100 MCG tablet Take 1 tablet (100 mcg total) by mouth daily before breakfast. 1/2 tablet on Sunday  . Multiple Vitamin (MULTIVITAMIN WITH MINERALS) TABS tablet Take 1 tablet by mouth daily.  Marland Kitchen omeprazole (PRILOSEC) 20 MG capsule TAKE 1 CAPSULE TWICE DAILY BEFORE MEALS  . oxazepam (SERAX) 15 MG capsule TAKE ONE (1) CAPSULE THREE (3) TIMES EACH DAY  . potassium chloride  20 MEQ/15ML (10%) solution TAKE 1&1/2 TEASPOONFULS DAILY  . sucralfate (CARAFATE) 1 GM/10ML suspension Take 10 mLs (1 g total) by mouth 2 (two) times daily. Before lunch, before supper,and bedtime  . triamcinolone cream (KENALOG) 0.1 %   . [DISCONTINUED] amoxicillin-clavulanate (AUGMENTIN) 875-125 MG per tablet Take 1 tablet by mouth 2 (two) times daily.  . [DISCONTINUED] benzonatate (TESSALON) 200 MG capsule Take 1 capsule (200 mg total) by mouth 3 (three) times daily as needed.  . [DISCONTINUED] guaiFENesin (ROBITUSSIN) 100 MG/5ML SOLN Take 5 mLs (100 mg total) by mouth every 4 (four) hours as needed for cough or to loosen phlegm.   No facility-administered encounter medications on file as of 03/31/2015.     Review of Systems  Constitutional: Negative.   HENT: Negative.   Eyes: Negative.   Respiratory: Negative.   Cardiovascular: Negative.   Gastrointestinal: Negative.   Endocrine: Negative.   Genitourinary: Negative.   Musculoskeletal: Negative.   Skin: Negative.   Allergic/Immunologic: Negative.   Neurological: Negative.   Hematological: Negative.   Psychiatric/Behavioral: Negative.        Objective:   Physical Exam  Constitutional: She is oriented to person, place, and time. She appears well-developed and well-nourished.  Patient has a small frame and a lot of dry skin but is very alert  HENT:  Head: Normocephalic and atraumatic.  Right Ear: External ear normal.  Left Ear: External ear normal.  Nose: Nose normal.  Mouth/Throat: Oropharynx is clear and moist.  She wears an upper plate and no lower plate.  Eyes: Conjunctivae and EOM are normal. Pupils are equal, round, and reactive to light. Right eye exhibits no discharge. Left eye exhibits no discharge. No scleral icterus.  Neck: Normal range of motion. Neck supple. No thyromegaly present.  No carotid bruits anterior cervical adenopathy or thyromegaly  Cardiovascular: Normal rate, regular rhythm, normal heart sounds and  intact distal pulses.   No murmur heard. The heart was slightly irregular at 76/m.  Pulmonary/Chest: Effort normal and breath sounds normal. No respiratory distress. She has no wheezes. She has no rales. She exhibits no tenderness.  Rare expiratory wheeze  Abdominal: Soft. Bowel sounds are normal. She exhibits no mass. There is no tenderness. There is no rebound and no guarding.  No masses or tenderness  Musculoskeletal: Normal range of motion. She exhibits no edema or tenderness.  Lymphadenopathy:    She has no cervical adenopathy.  Neurological: She is alert and oriented to person, place, and time. She has normal reflexes. No cranial nerve deficit.  Skin: Skin is warm and dry. No rash noted.  Patient has. Dry skin and she complains of itching with this.  Psychiatric: She has a normal mood and affect. Her behavior is normal. Judgment and thought content normal.  Nursing note and vitals reviewed.  BP 116/82 mmHg  Pulse 89  Temp(Src) 97.8 F (36.6 C) (Oral)  Ht 5' 2"  (1.575 m)  Wt 105 lb (47.628 kg)  BMI 19.20 kg/m2        Assessment & Plan:  1. Gastroesophageal reflux disease, esophagitis presence not specified -The patient should continue with her current omeprazole as she is having no complaints of this today. - POCT CBC - Hepatic function panel  2. Vitamin D deficiency -She should continue with vitamin D replacement pending results of lab work - POCT CBC - Vit D  25 hydroxy (rtn osteoporosis monitoring)  3. Hyperlipemia -She should continue with atorvastatin pending results of cholesterol - POCT CBC - Lipid panel  4. Hypothyroidism, unspecified hypothyroidism type -She is having no symptoms relating to her thyroid being not controlled until lab work is returned she should continue with current treatment - POCT CBC - Thyroid Panel With TSH  5. Essential hypertension -The blood pressure was good today and she should continue with her current treatment - POCT CBC -  BMP8+EGFR - Hepatic function panel  6. Adjustment disorder with anxious mood -She seems to be calmer today and she should continue with her Lexapro and her Serax as needed.  Meds ordered this encounter  Medications  . oxazepam (SERAX) 15 MG capsule    Sig: TAKE ONE (1) CAPSULE THREE (3) TIMES EACH DAY    Dispense:  90 capsule    Refill:  1   Patient Instructions                       Medicare Annual Wellness Visit  [facility] and the medical providers at [facility] strive to bring you the best medical care.  In doing so we not only want to address your current medical conditions and concerns but also to detect new conditions early and prevent illness, disease and health-related problems.    Medicare offers a yearly Wellness Visit which allows our clinical staff to assess your need for preventative services including immunizations, lifestyle education, counseling to decrease risk of preventable diseases and screening for fall risk and other medical concerns.    This  visit is provided free of charge (no copay) for all Medicare recipients. The clinical pharmacists at [facility] have begun to conduct these Wellness Visits which will also include a thorough review of all your medications.    As you primary medical provider recommend that you make an appointment for your Annual Wellness Visit if you have not done so already this year.  You may set up this appointment before you leave today or you may call back ([phone]) and schedule an appointment.  Please make sure when you call that you mention that you are scheduling your Annual Wellness Visit with the clinical pharmacist so that the appointment may be made for the proper length of time.     Continue current medications. Continue good therapeutic lifestyle changes which include good diet and exercise. Fall precautions discussed with patient. If an FOBT was given today- please return it to our  front desk. If you are over 30 years old - you may need Prevnar 71 or the adult Pneumonia vaccine.  Flu Shots are still available at our office. If you still haven't had one please call to set up a nurse visit to get one.   After your visit with [place] today you will receive a survey in the mail or online from [employer] regarding your care with [place]. Please take a moment to fill this out. Your feedback is very important to [place] as you can help [place] better understand your patient needs as well as improve your experience and satisfaction. WE CARE ABOUT YOU!!!   The patient should continue to walk and exercise regularly in the summer drink plenty of fluids and stay well hydrated She should be careful not to put herself at risk for falling   Arrie Senate MD

## 2015-03-31 NOTE — Patient Instructions (Addendum)
Medicare Annual Wellness Visit  [facility] and the medical providers at [facility] strive to bring you the best medical care.  In doing so we not only want to address your current medical conditions and concerns but also to detect new conditions early and prevent illness, disease and health-related problems.    Medicare offers a yearly Wellness Visit which allows our clinical staff to assess your need for preventative services including immunizations, lifestyle education, counseling to decrease risk of preventable diseases and screening for fall risk and other medical concerns.    This visit is provided free of charge (no copay) for all Medicare recipients. The clinical pharmacists at [facility] have begun to conduct these Wellness Visits which will also include a thorough review of all your medications.    As you primary medical provider recommend that you make an appointment for your Annual Wellness Visit if you have not done so already this year.  You may set up this appointment before you leave today or you may call back ([phone]) and schedule an appointment.  Please make sure when you call that you mention that you are scheduling your Annual Wellness Visit with the clinical pharmacist so that the appointment may be made for the proper length of time.     Continue current medications. Continue good therapeutic lifestyle changes which include good diet and exercise. Fall precautions discussed with patient. If an FOBT was given today- please return it to our front desk. If you are over 9 years old - you may need Prevnar 46 or the adult Pneumonia vaccine.  Flu Shots are still available at our office. If you still haven't had one please call to set up a nurse visit to get one.   After your visit with [place] today you will receive a survey in the mail or online from [employer] regarding your care with [place]. Please take a moment to  fill this out. Your feedback is very important to [place] as you can help [place] better understand your patient needs as well as improve your experience and satisfaction. WE CARE ABOUT YOU!!!   The patient should continue to walk and exercise regularly in the summer drink plenty of fluids and stay well hydrated She should be careful not to put herself at risk for falling

## 2015-04-01 LAB — LIPID PANEL
Chol/HDL Ratio: 2.6 ratio units (ref 0.0–4.4)
Cholesterol, Total: 128 mg/dL (ref 100–199)
HDL: 49 mg/dL (ref >39)
LDL CALC: 57 mg/dL (ref 0–99)
Triglycerides: 111 mg/dL (ref 0–149)
VLDL Cholesterol Cal: 22 mg/dL (ref 5–40)

## 2015-04-01 LAB — VITAMIN D 25 HYDROXY (VIT D DEFICIENCY, FRACTURES): VIT D 25 HYDROXY: 47.9 ng/mL (ref 30.0–100.0)

## 2015-04-01 LAB — HEPATIC FUNCTION PANEL
ALBUMIN: 4.4 g/dL (ref 3.5–4.8)
ALK PHOS: 92 IU/L (ref 39–117)
ALT: 6 IU/L (ref 0–32)
AST: 16 IU/L (ref 0–40)
Bilirubin Total: 0.7 mg/dL (ref 0.0–1.2)
Bilirubin, Direct: 0.2 mg/dL (ref 0.00–0.40)
Total Protein: 7 g/dL (ref 6.0–8.5)

## 2015-04-01 LAB — BMP8+EGFR
BUN/Creatinine Ratio: 18 (ref 11–26)
BUN: 17 mg/dL (ref 8–27)
CALCIUM: 9.7 mg/dL (ref 8.7–10.3)
CO2: 25 mmol/L (ref 18–29)
Chloride: 98 mmol/L (ref 97–108)
Creatinine, Ser: 0.92 mg/dL (ref 0.57–1.00)
GFR, EST AFRICAN AMERICAN: 70 mL/min/{1.73_m2} (ref >59)
GFR, EST NON AFRICAN AMERICAN: 61 mL/min/{1.73_m2} (ref >59)
Glucose: 95 mg/dL (ref 65–99)
Potassium: 3.5 mmol/L (ref 3.5–5.2)
Sodium: 142 mmol/L (ref 134–144)

## 2015-04-01 LAB — THYROID PANEL WITH TSH
Free Thyroxine Index: 4.6 (ref 1.2–4.9)
T3 UPTAKE RATIO: 32 % (ref 24–39)
T4 TOTAL: 14.3 ug/dL — AB (ref 4.5–12.0)
TSH: 0.009 u[IU]/mL — ABNORMAL LOW (ref 0.450–4.500)

## 2015-05-28 ENCOUNTER — Ambulatory Visit (INDEPENDENT_AMBULATORY_CARE_PROVIDER_SITE_OTHER): Payer: Commercial Managed Care - HMO

## 2015-05-28 ENCOUNTER — Ambulatory Visit (INDEPENDENT_AMBULATORY_CARE_PROVIDER_SITE_OTHER): Payer: Commercial Managed Care - HMO | Admitting: Pharmacist

## 2015-05-28 ENCOUNTER — Other Ambulatory Visit: Payer: Self-pay | Admitting: Family Medicine

## 2015-05-28 VITALS — BP 120/70 | HR 70 | Ht 62.0 in | Wt 106.0 lb

## 2015-05-28 DIAGNOSIS — Z0000 Encounter for general adult medical examination without abnormal findings: Secondary | ICD-10-CM

## 2015-05-28 DIAGNOSIS — M25559 Pain in unspecified hip: Secondary | ICD-10-CM | POA: Diagnosis not present

## 2015-05-28 DIAGNOSIS — M533 Sacrococcygeal disorders, not elsewhere classified: Secondary | ICD-10-CM

## 2015-05-28 DIAGNOSIS — E890 Postprocedural hypothyroidism: Secondary | ICD-10-CM | POA: Diagnosis not present

## 2015-05-28 DIAGNOSIS — W19XXXA Unspecified fall, initial encounter: Secondary | ICD-10-CM

## 2015-05-28 DIAGNOSIS — M545 Low back pain: Secondary | ICD-10-CM

## 2015-05-28 MED ORDER — RALOXIFENE HCL 60 MG PO TABS
60.0000 mg | ORAL_TABLET | Freq: Every day | ORAL | Status: DC
Start: 2015-05-28 — End: 2015-08-06

## 2015-05-28 NOTE — Progress Notes (Signed)
Patient ID: [name], female   DOB: [birth year], 74 y.o.   MRN: [id number]    Subjective:   LIESE DIZDAREVIC is a 74 y.o. WF who presents for an Initial Medicare Annual Wellness Visit and discuss treatment for osteoporosis.    Patient had DEXA 12/2014.  We have looked into insurance coverage for both Prolia and Reclast.  Patient was unable to afford these options. She has taken Forteo in past for 2 years.  She has tried oral bisphonates and was unable to continue due to esophageal pain / burning.   Patient states that she fell about 1 week ago and she has been having some pain in the coccyx area.  Discussed with Dr Laurance Flatten and he ordered xrays today.  Patient has a history of vertebral fractures and rib fractures in the past.    Current Medications (verified) Outpatient Encounter Prescriptions as of 05/28/2015  Medication Sig  . atorvastatin (LIPITOR) 80 MG tablet TAKE 1 TABLET EVERY DAY  . Cholecalciferol (VITAMIN D3) 5000 UNITS TABS Take 1 tablet by mouth daily.    Marland Kitchen escitalopram (LEXAPRO) 10 MG tablet Take 1 tablet (10 mg total) by mouth daily.  . hydrochlorothiazide (HYDRODIURIL) 25 MG tablet TAKE 1 TABLET EVERY DAY  . levothyroxine (SYNTHROID, LEVOTHROID) 100 MCG tablet Patient reports taking 1/2 tablet daily since 03/2015  . Multiple Vitamin (MULTIVITAMIN WITH MINERALS) TABS tablet Take 1 tablet by mouth daily.  Marland Kitchen omeprazole (PRILOSEC) 20 MG capsule TAKE 1 CAPSULE TWICE DAILY BEFORE MEALS  . oxazepam (SERAX) 15 MG capsule TAKE ONE (1) CAPSULE THREE (3) TIMES EACH DAY  . potassium chloride 20 MEQ/15ML (10%) solution TAKE 1&1/2 TEASPOONFULS DAILY  . sucralfate (CARAFATE) 1 GM/10ML suspension Take 10 mLs (1 g total) by mouth 2 (two) times daily. Before lunch, before supper,and bedtime  . triamcinolone cream (KENALOG) 0.1 %   . [DISCONTINUED] denosumab (PROLIA) 60 MG/ML SOLN injection Inject 60 mg into the skin once. Bring to office for administration. Administer in upper arm, thigh, or  abdomen (Patient not taking: Reported on 05/28/2015)   No facility-administered encounter medications on file as of 05/28/2015.    Allergies (verified) Bisphosphonates; Ciprofloxacin; Codeine; and Sulfa antibiotics   History: Past Medical History  Diagnosis Date  . Unspecified hypothyroidism   . Anxiety states   . Other and unspecified hyperlipidemia   . Hiatal hernia   . Constipation   . Esophageal stricture   . Osteoporosis   . GERD (gastroesophageal reflux disease)   . Vertebral fracture, osteoporotic   . Fracture, rib   . Cataract   . Cancer     skin  . Breast cyst    Past Surgical History  Procedure Laterality Date  . Partial hysterectomy    . Cataract extraction    . Thyroid lobectomy      right  . Breast biopsy    . Eye surgery    . Hand surgery     Family History  Problem Relation Age of Onset  . Goiter Father   . Alcohol abuse Father   . Heart disease Sister   . Cancer Sister     breast and skin  . Hernia Brother   . Cancer Brother     throat  . Cancer Brother   . Heart disease Brother   . Early death Brother     auto accident   Social History   Occupational History  . Not on file.   Social History Main Topics  . Smoking  status: Former Research scientist (life sciences)  . Smokeless tobacco: Never Used     Comment: Non-smoker  . Alcohol Use: No  . Drug Use: No  . Sexual Activity: No    Do you feel safe at home?  Yes  Dietary issues and exercise activities: Current Exercise Habits:: The patient does not participate in regular exercise at present  Current Dietary habits:  Patient drinks Ensure daily when she can affords them.  Her insurance does not pay for Ensure even with Rx.   Objective:    Today's Vitals   05/28/15 1138  BP: 120/70  Pulse: 70  Height: 5\' 2"  (1.575 m)  Weight: 106 lb (48.081 kg)  PainSc: 0-No pain   Body mass index is 19.38 kg/(m^2).  Activities of Daily Living In your present state of health, do you have any difficulty performing the  following activities: 05/28/2015 09/25/2014  Hearing? Y N  Vision? N N  Difficulty concentrating or making decisions? N N  Walking or climbing stairs? N N  Dressing or bathing? N N  Doing errands, shopping? N N  Preparing Food and eating ? N -  Using the Toilet? N -  In the past six months, have you accidently leaked urine? Y -  Do you have problems with loss of bowel control? N -  Managing your Medications? N -  Managing your Finances? N -  Housekeeping or managing your Housekeeping? N -    Are there smokers in your home (other than you)? No   Cardiac Risk Factors include: advanced age (>23men, >59 women);dyslipidemia;family history of premature cardiovascular disease;sedentary lifestyle  Depression Screen PHQ 2/9 Scores 05/28/2015 03/31/2015 11/27/2014 09/25/2014  PHQ - 2 Score 0 0 0 1    Fall Risk Fall Risk  05/28/2015 03/31/2015 11/27/2014 09/25/2014 07/17/2014  Falls in the past year? Yes No No No No  Number falls in past yr: 1 - - - -  Injury with Fall? Yes - - - -  Risk Factor Category  High Fall Risk - - - -  Follow up Falls prevention discussed;Education provided - - - -    Cognitive Function: MMSE - Mini Mental State Exam 05/28/2015  Orientation to time 5  Orientation to Place 5  Registration 3  Attention/ Calculation 5  Recall 3  Language- name 2 objects 2  Language- repeat 1  Language- follow 3 step command 2  Language- read & follow direction 1  Write a sentence 1  Copy design 1  Total score 29    Immunizations and Health Maintenance Immunization History  Administered Date(s) Administered  . Influenza Whole 06/18/2010  . Influenza,inj,Quad PF,36+ Mos 07/23/2013, 07/17/2014  . Pneumococcal Conjugate-13 11/05/2013  . Pneumococcal Polysaccharide-23 10/18/2004  . Td 08/18/2010   Health Maintenance Due  Topic Date Due  . INFLUENZA VACCINE  05/19/2015    Patient Care Team: Chipper Herb, MD as PCP - General (Family Medicine) Minus Breeding, MD  (Cardiology) Clarene Essex, MD (Gastroenterology) Lavonna Monarch, MD as Consulting Physician (Dermatology) Luberta Mutter, MD as Consulting Physician (Ophthalmology)  Indicate any recent Medical Services you may have received from other than [facility] providers in the past year (date may be approximate).    Assessment:    Annual Wellness Visit  Lower back pain Hypothyroidism - due to recheck after levothyroxine dose change   Screening Tests Health Maintenance  Topic Date Due  . INFLUENZA VACCINE  05/19/2015  . MAMMOGRAM  06/12/2015  . DEXA SCAN  12/17/2016  . COLONOSCOPY  03/18/2017  .  TETANUS/TDAP  08/18/2020  . ZOSTAVAX  Completed  . PNA vac Low Risk Adult  Completed      Plan:   During the course of the visit Darielle was educated and counseled about the following appropriate screening and preventive services:   Vaccines to include Pneumoccal, Influenza, Hepatitis B, Td, Zostavax - UTD on required vaccines  Colorectal cancer screening - UTD  Cardiovascular disease screening - last EKG 06/2014;  Lipids panel 03/2015 and patient is taking atorvastatin.  BP at goal.  Diabetes screening - UTD  Bone Denisty / Osteoporosis Screening - UTD.  Recommended trial of Evista 60mg  take 1 tablet daily.  Mammogram - due to recheck after 06/12/2015 - patient to make appt.  PAP - UTD  Glaucoma screening / Eye Exam - UTD  Nutrition counseling - continue with Ensure daily  Advanced Directives - Information given.   Exercise - not recommended at this time - patient has history of sustaining rib fracture while exercising.   Thyroid panel drawn today  X-Ray of hip and spine per Dr Laurance Flatten.     Patient Instructions (the written plan) were given to the patient.   Cherre Robins, [place], CPP, CDE   05/28/2015

## 2015-05-28 NOTE — Patient Instructions (Addendum)
  [name] , Thank you for taking time to come for your Medicare Wellness Visit. I appreciate your ongoing commitment to your health goals. Please review the following plan we discussed and let me know if I can assist you in the future.   These are the goals we discussed: Goals    Continue to limit high fat foods and eats lots of whole grains, fruits and non starchy vegetables such as carrots, greens beans, green leafy vegetables, cucumbers, squash, zucchini, onion, peppers, tomatoes, broccoli, cauliflower, celery   Goal weight 110 lbs.  Continue to drink Ensure daily      This is a list of the screening recommended for you and due dates:  Health Maintenance  Topic Date Due  . Flu Shot  05/19/2015  . Mammogram  06/12/2015  . DEXA scan (bone density measurement)  12/17/2016  . Colon Cancer Screening  03/18/2017  . Tetanus Vaccine  08/18/2020  . Shingles Vaccine  Completed  . Pneumonia vaccines  Completed

## 2015-05-29 ENCOUNTER — Telehealth: Payer: Self-pay | Admitting: *Deleted

## 2015-05-29 LAB — THYROID PANEL WITH TSH
Free Thyroxine Index: 3.1 (ref 1.2–4.9)
T3 UPTAKE RATIO: 27 % (ref 24–39)
T4 TOTAL: 11.3 ug/dL (ref 4.5–12.0)
TSH: 0.088 u[IU]/mL — ABNORMAL LOW (ref 0.450–4.500)

## 2015-06-03 ENCOUNTER — Other Ambulatory Visit: Payer: Self-pay | Admitting: Family Medicine

## 2015-06-03 NOTE — Addendum Note (Signed)
Addended by: Ilean China on: 06/03/2015 10:44 AM   Modules accepted: Orders

## 2015-07-16 ENCOUNTER — Other Ambulatory Visit (INDEPENDENT_AMBULATORY_CARE_PROVIDER_SITE_OTHER): Payer: Commercial Managed Care - HMO

## 2015-07-16 DIAGNOSIS — E890 Postprocedural hypothyroidism: Secondary | ICD-10-CM | POA: Diagnosis not present

## 2015-07-16 NOTE — Progress Notes (Signed)
Lab only 

## 2015-07-17 LAB — THYROID PANEL WITH TSH
FREE THYROXINE INDEX: 2.4 (ref 1.2–4.9)
T3 UPTAKE RATIO: 25 % (ref 24–39)
T4, Total: 9.7 ug/dL (ref 4.5–12.0)
TSH: 0.143 u[IU]/mL — ABNORMAL LOW (ref 0.450–4.500)

## 2015-07-22 NOTE — Progress Notes (Signed)
[email or web address]

## 2015-07-24 NOTE — Progress Notes (Signed)
Patient aware. States she is taking meds as directed. Has appt with dwm on the 19th and will discuss then.

## 2015-08-01 ENCOUNTER — Other Ambulatory Visit: Payer: Self-pay | Admitting: Family Medicine

## 2015-08-06 ENCOUNTER — Ambulatory Visit (INDEPENDENT_AMBULATORY_CARE_PROVIDER_SITE_OTHER): Payer: Commercial Managed Care - HMO | Admitting: Family Medicine

## 2015-08-06 ENCOUNTER — Encounter: Payer: Self-pay | Admitting: Family Medicine

## 2015-08-06 VITALS — BP 114/84 | HR 82 | Temp 97.0°F | Ht 62.0 in | Wt 107.0 lb

## 2015-08-06 DIAGNOSIS — E039 Hypothyroidism, unspecified: Secondary | ICD-10-CM

## 2015-08-06 DIAGNOSIS — E890 Postprocedural hypothyroidism: Secondary | ICD-10-CM

## 2015-08-06 DIAGNOSIS — Z23 Encounter for immunization: Secondary | ICD-10-CM | POA: Diagnosis not present

## 2015-08-06 DIAGNOSIS — E785 Hyperlipidemia, unspecified: Secondary | ICD-10-CM | POA: Diagnosis not present

## 2015-08-06 DIAGNOSIS — I10 Essential (primary) hypertension: Secondary | ICD-10-CM

## 2015-08-06 DIAGNOSIS — Z1212 Encounter for screening for malignant neoplasm of rectum: Secondary | ICD-10-CM | POA: Diagnosis not present

## 2015-08-06 DIAGNOSIS — E559 Vitamin D deficiency, unspecified: Secondary | ICD-10-CM | POA: Diagnosis not present

## 2015-08-06 MED ORDER — OXAZEPAM 15 MG PO CAPS: ORAL_CAPSULE | ORAL | Status: DC

## 2015-08-06 MED ORDER — TRIAMCINOLONE ACETONIDE 0.5 % EX CREA: 1.0000 "application " | TOPICAL_CREAM | Freq: Three times a day (TID) | CUTANEOUS | Status: DC

## 2015-08-06 NOTE — Patient Instructions (Addendum)
Medicare Annual Wellness Visit  [facility] and the medical providers at [facility] strive to bring you the best medical care.  In doing so we not only want to address your current medical conditions and concerns but also to detect new conditions early and prevent illness, disease and health-related problems.    Medicare offers a yearly Wellness Visit which allows our clinical staff to assess your need for preventative services including immunizations, lifestyle education, counseling to decrease risk of preventable diseases and screening for fall risk and other medical concerns.    This visit is provided free of charge (no copay) for all Medicare recipients. The clinical pharmacists at [facility] have begun to conduct these Wellness Visits which will also include a thorough review of all your medications.    As you primary medical provider recommend that you make an appointment for your Annual Wellness Visit if you have not done so already this year.  You may set up this appointment before you leave today or you may call back ([phone]) and schedule an appointment.  Please make sure when you call that you mention that you are scheduling your Annual Wellness Visit with the clinical pharmacist so that the appointment may be made for the proper length of time.     Continue current medications. Continue good therapeutic lifestyle changes which include good diet and exercise. Fall precautions discussed with patient. If an FOBT was given today- please return it to our front desk. If you are over 89 years old - you may need Prevnar 74 or the adult Pneumonia vaccine.  **Flu shots are available--- please call and schedule a FLU-CLINIC appointment**  After your visit with [place] today you will receive a survey in the mail or online from [employer] regarding your care with [place]. Please take a moment to fill this out. Your feedback is very  important to [place] as you can help [place] better understand your patient needs as well as improve your experience and satisfaction. WE CARE ABOUT YOU!!!   TAKE LEVOTHYROXINE 100 MCG - 1/2 TAB EVERYDAY AND NONE ON Sunday! Recheck thyroid profile in about 4 weeks We'll also schedule you for a mammogram since you are past due on this. We will call you with the results of the FOBT and any lab work is done today as soon as those results become available We will change your atorvastatin to taking 40 mg daily instead of a half of an 80. We will also change her HCTZ to taking 12.5 mg daily instead of a half of a 25

## 2015-08-06 NOTE — Progress Notes (Signed)
Subjective:    Patient ID: [name], female    DOB: 1941/05/19, 74 y.o.   MRN: [id number]  HPI Pt here for follow up and management of chronic medical problems which includes hypertension, hyperlipidemia, and hypothyroid. She is taking medications regularly. The patient has had a recent thyroid profile and this was indicating she needed to reduce the medication and this was about 2 weeks ago. She has changed the medication is now taking a half of 100 g daily Monday through Saturday and none on Sunday. We will recheck the affects of this and about 4 more weeks at that time make any further adjustments. She denies chest pain shortness of breath trouble swallowing heartburn indigestion and nausea vomiting diarrhea or blood in the stool. She does indicate she has trouble swallowing real hot food but can tolerate it warm. She says she is swallowing better than she has in the past.     Patient Active Problem List   Diagnosis Date Noted  . Hyperlipemia 02/14/2013  . Hypothyroid 02/14/2013  . Vitamin D deficiency 02/14/2013  . Generalized anxiety disorder 02/14/2013  . Constipation   . Esophageal stricture   . Osteoporosis with pathological fracture   . GERD (gastroesophageal reflux disease)    Outpatient Encounter Prescriptions as of 08/06/2015  Medication Sig  . atorvastatin (LIPITOR) 80 MG tablet TAKE 1 TABLET EVERY DAY  . Cholecalciferol (VITAMIN D3) 5000 UNITS TABS Take 1 tablet by mouth daily.    Marland Kitchen ENSURE PLUS (ENSURE PLUS) LIQD Take 237 mLs by mouth daily.  Marland Kitchen escitalopram (LEXAPRO) 10 MG tablet Take 1 tablet (10 mg total) by mouth daily.  . hydrochlorothiazide (HYDRODIURIL) 25 MG tablet TAKE 1 TABLET EVERY DAY  . levothyroxine (SYNTHROID, LEVOTHROID) 100 MCG tablet Take 1 tablet (100 mcg total) by mouth daily before breakfast. 1/2 tablet on Sunday (Patient taking differently: Take 50 mcg by mouth daily before breakfast. )  . Multiple Vitamin (MULTIVITAMIN WITH MINERALS) TABS  tablet Take 1 tablet by mouth daily.  Marland Kitchen omeprazole (PRILOSEC) 20 MG capsule TAKE 1 CAPSULE TWICE DAILY BEFORE MEALS  . oxazepam (SERAX) 15 MG capsule TAKE ONE (1) CAPSULE THREE (3) TIMES EACH DAY  . potassium chloride 20 MEQ/15ML (10%) solution TAKE 1&1/2 TEASPOONFULS DAILY  . sucralfate (CARAFATE) 1 GM/10ML suspension Take 10 mLs (1 g total) by mouth 2 (two) times daily. Before lunch, before supper,and bedtime  . triamcinolone cream (KENALOG) 0.1 %   . [DISCONTINUED] oxazepam (SERAX) 15 MG capsule TAKE ONE (1) CAPSULE THREE (3) TIMES EACH DAY  . triamcinolone cream (KENALOG) 0.5 % Apply 1 application topically 3 (three) times daily.  . [DISCONTINUED] raloxifene (EVISTA) 60 MG tablet Take 1 tablet (60 mg total) by mouth daily. For bones   No facility-administered encounter medications on file as of 08/06/2015.      Review of Systems  Constitutional: Negative.   HENT: Negative.   Eyes: Negative.   Respiratory: Negative.   Cardiovascular: Negative.   Gastrointestinal: Negative.   Endocrine: Negative.   Genitourinary: Negative.   Musculoskeletal: Negative.   Skin: Negative.   Allergic/Immunologic: Negative.   Neurological: Negative.   Hematological: Negative.   Psychiatric/Behavioral: Negative.        Objective:   Physical Exam  Constitutional: She is oriented to person, place, and time. She appears well-nourished.  Small framed and alert  HENT:  Head: Normocephalic and atraumatic.  Right Ear: External ear normal.  Left Ear: External ear normal.  Nose: Nose normal.  Mouth/Throat: Oropharynx  is clear and moist. No oropharyngeal exudate.  Eyes: Conjunctivae and EOM are normal. Pupils are equal, round, and reactive to light. Right eye exhibits no discharge. Left eye exhibits no discharge. No scleral icterus.  Neck: Normal range of motion. Neck supple. No thyromegaly present.  No thyromegaly anterior cervical adenopathy or carotid bruits  Cardiovascular: Normal rate, regular  rhythm, normal heart sounds and intact distal pulses.  Exam reveals no friction rub.   No murmur heard. At 72/m  Pulmonary/Chest: Effort normal and breath sounds normal. No respiratory distress. She has no wheezes. She has no rales. She exhibits no tenderness.  Clear anteriorly and posteriorly  Abdominal: Soft. Bowel sounds are normal. She exhibits no mass. There is no tenderness. There is no rebound and no guarding.  Musculoskeletal: Normal range of motion. She exhibits no edema.  Lymphadenopathy:    She has no cervical adenopathy.  Neurological: She is alert and oriented to person, place, and time. She has normal reflexes. No cranial nerve deficit.  Skin: Skin is warm and dry. No rash noted.  Psychiatric: She has a normal mood and affect. Her behavior is normal. Judgment and thought content normal.  Nursing note and vitals reviewed.  BP 114/84 mmHg  Pulse 82  Temp(Src) 97 F (36.1 C) (Oral)  Ht 5' 2"  (1.575 m)  Wt 107 lb (48.535 kg)  BMI 19.57 kg/m2        Assessment & Plan:  1. Screening for malignant neoplasm of the rectum -The patient return her FOBT today and we will let her know the results as soon as we get those back. - Fecal occult blood, imunochemical - CBC with Differential/Platelet  2. Vitamin D deficiency -She should continue her current vitamin D treatment pending results of lab work today. - CBC with Differential/Platelet - Vit D  25 hydroxy (rtn osteoporosis monitoring)  3. Postoperative hypothyroidism -The thyroid medicine is currently being adjusted and the patient will return to clinic in about 4 weeks for repeat thyroid profile - CBC with Differential/Platelet  4. Hyperlipemia -She should continue current treatment pending results of lab - CBC with Differential/Platelet - NMR, lipoprofile  5. Hypothyroidism, unspecified hypothyroidism type -Repeat thyroid profile in about 4 weeks at that time she would've been taking Synthroid or levothyroxine 100  g one half daily Monday through Saturday and none on Sunday - CBC with Differential/Platelet  6. Essential hypertension -Pressure is good today and she will continue current treatment - BMP8+EGFR - CBC with Differential/Platelet - Hepatic function panel  7. Encounter for immunization -Flu shot given today  Meds ordered this encounter  Medications  . triamcinolone cream (KENALOG) 0.5 %    Sig: Apply 1 application topically 3 (three) times daily.    Dispense:  90 g    Refill:  1  . oxazepam (SERAX) 15 MG capsule    Sig: TAKE ONE (1) CAPSULE THREE (3) TIMES EACH DAY    Dispense:  90 capsule    Refill:  2   Patient Instructions                       Medicare Annual Wellness Visit  [facility] and the medical providers at [facility] strive to bring you the best medical care.  In doing so we not only want to address your current medical conditions and concerns but also to detect new conditions early and prevent illness, disease and health-related problems.    Medicare offers a yearly Wellness Visit  which allows our clinical staff to assess your need for preventative services including immunizations, lifestyle education, counseling to decrease risk of preventable diseases and screening for fall risk and other medical concerns.    This visit is provided free of charge (no copay) for all Medicare recipients. The clinical pharmacists at [facility] have begun to conduct these Wellness Visits which will also include a thorough review of all your medications.    As you primary medical provider recommend that you make an appointment for your Annual Wellness Visit if you have not done so already this year.  You may set up this appointment before you leave today or you may call back [phone]) and schedule an appointment.  Please make sure when you call that you mention that you are scheduling your Annual Wellness Visit with the clinical pharmacist so  that the appointment may be made for the proper length of time.     Continue current medications. Continue good therapeutic lifestyle changes which include good diet and exercise. Fall precautions discussed with patient. If an FOBT was given today- please return it to our front desk. If you are over 76 years old - you may need Prevnar 72 or the adult Pneumonia vaccine.  **Flu shots are available--- please call and schedule a FLU-CLINIC appointment**  After your visit with [place] today you will receive a survey in the mail or online from [employer] regarding your care with [place]. Please take a moment to fill this out. Your feedback is very important to [place] as you can help [place] better understand your patient needs as well as improve your experience and satisfaction. WE CARE ABOUT YOU!!!   TAKE LEVOTHYROXINE 100 MCG - 1/2 TAB EVERYDAY AND NONE ON Sunday! Recheck thyroid profile in about 4 weeks We'll also schedule you for a mammogram since you are past due on this. We will call you with the results of the FOBT and any lab work is done today as soon as those results become available We will change your atorvastatin to taking 40 mg daily instead of a half of an 80. We will also change her HCTZ to taking 12.5 mg daily instead of a half of a 25   Arrie Senate MD

## 2015-08-07 LAB — CBC WITH DIFFERENTIAL/PLATELET
BASOS ABS: 0.1 10*3/uL (ref 0.0–0.2)
Basos: 1 %
EOS (ABSOLUTE): 0.2 10*3/uL (ref 0.0–0.4)
EOS: 2 %
HEMATOCRIT: 40.8 % (ref 34.0–46.6)
Hemoglobin: 13.7 g/dL (ref 11.1–15.9)
IMMATURE GRANULOCYTES: 0 %
Immature Grans (Abs): 0 10*3/uL (ref 0.0–0.1)
Lymphocytes Absolute: 2.8 10*3/uL (ref 0.7–3.1)
Lymphs: 35 %
MCH: 30.4 pg (ref 26.6–33.0)
MCHC: 33.6 g/dL (ref 31.5–35.7)
MCV: 91 fL (ref 79–97)
MONOS ABS: 0.6 10*3/uL (ref 0.1–0.9)
Monocytes: 7 %
NEUTROS PCT: 55 %
Neutrophils Absolute: 4.4 10*3/uL (ref 1.4–7.0)
PLATELETS: 278 10*3/uL (ref 150–379)
RBC: 4.5 x10E6/uL (ref 3.77–5.28)
RDW: 14.2 % (ref 12.3–15.4)
WBC: 8 10*3/uL (ref 3.4–10.8)

## 2015-08-07 LAB — NMR, LIPOPROFILE
CHOLESTEROL: 164 mg/dL (ref 100–199)
HDL CHOLESTEROL BY NMR: 56 mg/dL (ref >39)
HDL PARTICLE NUMBER: 37.1 umol/L (ref >=30.5)
LDL Particle Number: 1008 nmol/L — ABNORMAL HIGH (ref <1000)
LDL Size: 20.4 nm (ref >20.5)
LDL-C: 85 mg/dL (ref 0–99)
LP-IR Score: 47 — ABNORMAL HIGH (ref <=45)
SMALL LDL PARTICLE NUMBER: 596 nmol/L — AB (ref <=527)
TRIGLYCERIDES BY NMR: 116 mg/dL (ref 0–149)

## 2015-08-07 LAB — BMP8+EGFR
BUN/Creatinine Ratio: 17 (ref 11–26)
BUN: 18 mg/dL (ref 8–27)
CO2: 27 mmol/L (ref 18–29)
CREATININE: 1.09 mg/dL — AB (ref 0.57–1.00)
Calcium: 9.9 mg/dL (ref 8.7–10.3)
Chloride: 97 mmol/L (ref 97–106)
GFR calc Af Amer: 57 mL/min/{1.73_m2} — ABNORMAL LOW (ref >59)
GFR calc non Af Amer: 49 mL/min/{1.73_m2} — ABNORMAL LOW (ref >59)
GLUCOSE: 98 mg/dL (ref 65–99)
Potassium: 3.6 mmol/L (ref 3.5–5.2)
SODIUM: 142 mmol/L (ref 136–144)

## 2015-08-07 LAB — HEPATIC FUNCTION PANEL
ALBUMIN: 4.6 g/dL (ref 3.5–4.8)
ALT: 13 IU/L (ref 0–32)
AST: 20 IU/L (ref 0–40)
Alkaline Phosphatase: 89 IU/L (ref 39–117)
BILIRUBIN TOTAL: 0.6 mg/dL (ref 0.0–1.2)
Bilirubin, Direct: 0.14 mg/dL (ref 0.00–0.40)
TOTAL PROTEIN: 7.5 g/dL (ref 6.0–8.5)

## 2015-08-07 LAB — VITAMIN D 25 HYDROXY (VIT D DEFICIENCY, FRACTURES): Vit D, 25-Hydroxy: 32.9 ng/mL (ref 30.0–100.0)

## 2015-08-08 LAB — FECAL OCCULT BLOOD, IMMUNOCHEMICAL: FECAL OCCULT BLD: NEGATIVE

## 2015-08-18 ENCOUNTER — Telehealth: Payer: Self-pay | Admitting: Family Medicine

## 2015-08-19 NOTE — Telephone Encounter (Signed)
Patient is aware that she can take Mucinex without a decongestant.

## 2015-08-22 ENCOUNTER — Ambulatory Visit (INDEPENDENT_AMBULATORY_CARE_PROVIDER_SITE_OTHER): Payer: Commercial Managed Care - HMO | Admitting: Family Medicine

## 2015-08-22 ENCOUNTER — Ambulatory Visit (INDEPENDENT_AMBULATORY_CARE_PROVIDER_SITE_OTHER): Payer: Commercial Managed Care - HMO

## 2015-08-22 ENCOUNTER — Encounter: Payer: Self-pay | Admitting: Family Medicine

## 2015-08-22 VITALS — BP 144/74 | HR 95 | Temp 97.4°F | Ht 62.0 in | Wt 109.0 lb

## 2015-08-22 DIAGNOSIS — R059 Cough, unspecified: Secondary | ICD-10-CM

## 2015-08-22 DIAGNOSIS — R05 Cough: Secondary | ICD-10-CM | POA: Diagnosis not present

## 2015-08-22 DIAGNOSIS — J301 Allergic rhinitis due to pollen: Secondary | ICD-10-CM

## 2015-08-22 DIAGNOSIS — J209 Acute bronchitis, unspecified: Secondary | ICD-10-CM

## 2015-08-22 DIAGNOSIS — J40 Bronchitis, not specified as acute or chronic: Secondary | ICD-10-CM

## 2015-08-22 MED ORDER — METHYLPREDNISOLONE ACETATE 80 MG/ML IJ SUSP
60.0000 mg | Freq: Once | INTRAMUSCULAR | Status: AC
  Administered 2015-08-22: 60 mg via INTRAMUSCULAR

## 2015-08-22 MED ORDER — AZITHROMYCIN 250 MG PO TABS: ORAL_TABLET | ORAL | Status: DC

## 2015-08-22 MED ORDER — PREDNISONE 10 MG PO TABS: ORAL_TABLET | ORAL | Status: DC

## 2015-08-22 NOTE — Progress Notes (Signed)
Subjective:    Patient ID: [name], female    DOB: 1940-12-05, 74 y.o.   MRN: [id number]  HPI Patient here today for a cough that started about 2 weeks ago. The sputum that she is bringing up is clear.     Patient Active Problem List   Diagnosis Date Noted  . Hyperlipemia 02/14/2013  . Hypothyroid 02/14/2013  . Vitamin D deficiency 02/14/2013  . Generalized anxiety disorder 02/14/2013  . Constipation   . Esophageal stricture   . Osteoporosis with pathological fracture   . GERD (gastroesophageal reflux disease)    Outpatient Encounter Prescriptions as of 08/22/2015  Medication Sig  . atorvastatin (LIPITOR) 80 MG tablet TAKE 1 TABLET EVERY DAY  . Cholecalciferol (VITAMIN D3) 5000 UNITS TABS Take 1 tablet by mouth daily.    Marland Kitchen ENSURE PLUS (ENSURE PLUS) LIQD Take 237 mLs by mouth daily.  Marland Kitchen escitalopram (LEXAPRO) 10 MG tablet Take 1 tablet (10 mg total) by mouth daily.  . hydrochlorothiazide (HYDRODIURIL) 25 MG tablet TAKE 1 TABLET EVERY DAY  . levothyroxine (SYNTHROID, LEVOTHROID) 100 MCG tablet Take 1 tablet (100 mcg total) by mouth daily before breakfast. 1/2 tablet on Sunday (Patient taking differently: Take 50 mcg by mouth daily before breakfast. )  . Multiple Vitamin (MULTIVITAMIN WITH MINERALS) TABS tablet Take 1 tablet by mouth daily.  Marland Kitchen omeprazole (PRILOSEC) 20 MG capsule TAKE 1 CAPSULE TWICE DAILY BEFORE MEALS  . oxazepam (SERAX) 15 MG capsule TAKE ONE (1) CAPSULE THREE (3) TIMES EACH DAY  . potassium chloride 20 MEQ/15ML (10%) solution TAKE 1&1/2 TEASPOONFULS DAILY  . sucralfate (CARAFATE) 1 GM/10ML suspension Take 10 mLs (1 g total) by mouth 2 (two) times daily. Before lunch, before supper,and bedtime  . triamcinolone cream (KENALOG) 0.1 %   . triamcinolone cream (KENALOG) 0.5 % Apply 1 application topically 3 (three) times daily.   No facility-administered encounter medications on file as of 08/22/2015.      Review of Systems  Constitutional: Negative.     HENT: Positive for congestion (clear).   Eyes: Negative.   Respiratory: Positive for cough.   Cardiovascular: Negative.   Gastrointestinal: Negative.   Endocrine: Negative.   Genitourinary: Negative.   Musculoskeletal: Negative.   Skin: Negative.   Allergic/Immunologic: Negative.   Neurological: Negative.   Hematological: Negative.   Psychiatric/Behavioral: Negative.        Objective:   Physical Exam  Constitutional: She is oriented to person, place, and time. No distress.  Alert and thin framed  HENT:  Head: Normocephalic.  Right Ear: External ear normal.  Left Ear: External ear normal.  Mouth/Throat: Oropharynx is clear and moist.  Nasal pallor bilaterally  Eyes: Conjunctivae and EOM are normal. Pupils are equal, round, and reactive to light. Right eye exhibits no discharge. Left eye exhibits no discharge. No scleral icterus.  Neck: Normal range of motion. Neck supple. No thyromegaly present.  No adenopathy  Cardiovascular: Normal rate, regular rhythm and normal heart sounds.   No murmur heard. Pulmonary/Chest: Effort normal and breath sounds normal. No respiratory distress. She has no wheezes. She has no rales. She exhibits no tenderness.  Dry cough without wheezes or rhonchi  Musculoskeletal: Normal range of motion. She exhibits no edema.  Lymphadenopathy:    She has no cervical adenopathy.  Neurological: She is alert and oriented to person, place, and time.  Skin: Skin is warm and dry.  Psychiatric: She has a normal mood and affect. Her behavior is normal. Judgment and thought content  normal.  Nursing note and vitals reviewed.   BP 144/74 mmHg  Pulse 95  Temp(Src) 97.4 F (36.3 C) (Oral)  Ht 5\' 2"  (1.575 m)  Wt 109 lb (49.442 kg)  BMI 19.93 kg/m2       Assessment & Plan:  1. Cough -Drink plenty of fluids and take Tessalon Perles twice daily if needed for cough -Stay well hydrated - DG Chest 2 View; Future - CBC with Differential/Platelet  2. Allergic  rhinitis due to pollen -Use nasal saline during the day - predniSONE (DELTASONE) 10 MG tablet; 1 tablet 4 times a day for 2 days,  1 tablet 3 times a day for 2 days,  1 tablet 2 times a day for 2 days, 1 tablet daily for 2 days  Dispense: 20 tablet; Refill: 0  3. Bronchitis with bronchospasm -Use Tessalon Perles and take antibiotic and prednisone as directed and drink plenty of fluids - methylPREDNISolone acetate (DEPO-MEDROL) injection 60 mg; Inject 0.75 mLs (60 mg total) into the muscle once. - predniSONE (DELTASONE) 10 MG tablet; 1 tablet 4 times a day for 2 days,  1 tablet 3 times a day for 2 days,  1 tablet 2 times a day for 2 days, 1 tablet daily for 2 days  Dispense: 20 tablet; Refill: 0 - azithromycin (ZITHROMAX) 250 MG tablet; 2 pills the first day then one daily for infection until completed  Dispense: 6 tablet; Refill: 0  Patient Instructions  Drink plenty of fluids Take antibiotic as directed until completed Use nasal saline during the day each nostril 3 or 4 times daily Use Flonase nasal spray 1 spray each nostril at bedtime   Arrie Senate MD

## 2015-08-22 NOTE — Patient Instructions (Signed)
Drink plenty of fluids Take antibiotic as directed until completed Use nasal saline during the day each nostril 3 or 4 times daily Use Flonase nasal spray 1 spray each nostril at bedtime

## 2015-08-23 LAB — CBC WITH DIFFERENTIAL/PLATELET
BASOS ABS: 0.1 10*3/uL (ref 0.0–0.2)
Basos: 1 %
EOS (ABSOLUTE): 0.4 10*3/uL (ref 0.0–0.4)
EOS: 4 %
HEMATOCRIT: 36.8 % (ref 34.0–46.6)
Hemoglobin: 12.1 g/dL (ref 11.1–15.9)
Immature Grans (Abs): 0 10*3/uL (ref 0.0–0.1)
Immature Granulocytes: 0 %
LYMPHS ABS: 2.2 10*3/uL (ref 0.7–3.1)
Lymphs: 25 %
MCH: 29.7 pg (ref 26.6–33.0)
MCHC: 32.9 g/dL (ref 31.5–35.7)
MCV: 90 fL (ref 79–97)
MONOS ABS: 0.8 10*3/uL (ref 0.1–0.9)
Monocytes: 10 %
Neutrophils Absolute: 5.1 10*3/uL (ref 1.4–7.0)
Neutrophils: 60 %
Platelets: 279 10*3/uL (ref 150–379)
RBC: 4.08 x10E6/uL (ref 3.77–5.28)
RDW: 13.9 % (ref 12.3–15.4)
WBC: 8.6 10*3/uL (ref 3.4–10.8)

## 2015-09-08 ENCOUNTER — Other Ambulatory Visit (INDEPENDENT_AMBULATORY_CARE_PROVIDER_SITE_OTHER): Payer: Commercial Managed Care - HMO

## 2015-09-08 DIAGNOSIS — E890 Postprocedural hypothyroidism: Secondary | ICD-10-CM | POA: Diagnosis not present

## 2015-09-08 NOTE — Progress Notes (Signed)
Lab only 

## 2015-09-09 LAB — THYROID PANEL WITH TSH
FREE THYROXINE INDEX: 2.8 (ref 1.2–4.9)
T3 Uptake Ratio: 29 % (ref 24–39)
T4 TOTAL: 9.7 ug/dL (ref 4.5–12.0)
TSH: 1.71 u[IU]/mL (ref 0.450–4.500)

## 2015-10-23 ENCOUNTER — Other Ambulatory Visit: Payer: Self-pay | Admitting: Family Medicine

## 2015-10-23 MED ORDER — SUCRALFATE 1 GM/10ML PO SUSP: 1.0000 g | Freq: Two times a day (BID) | ORAL | Status: DC

## 2015-10-23 NOTE — Telephone Encounter (Signed)
done

## 2015-12-03 DIAGNOSIS — H5210 Myopia, unspecified eye: Secondary | ICD-10-CM | POA: Diagnosis not present

## 2015-12-03 DIAGNOSIS — H524 Presbyopia: Secondary | ICD-10-CM | POA: Diagnosis not present

## 2015-12-05 ENCOUNTER — Other Ambulatory Visit: Payer: Self-pay | Admitting: Family Medicine

## 2015-12-05 NOTE — Telephone Encounter (Signed)
Last filled 11/05/15, last seen 08/06/15. [facility]

## 2015-12-10 ENCOUNTER — Ambulatory Visit (INDEPENDENT_AMBULATORY_CARE_PROVIDER_SITE_OTHER): Payer: Commercial Managed Care - HMO | Admitting: Family Medicine

## 2015-12-10 ENCOUNTER — Encounter: Payer: Self-pay | Admitting: Family Medicine

## 2015-12-10 VITALS — BP 122/73 | HR 80 | Temp 97.4°F | Ht 62.0 in | Wt 113.0 lb

## 2015-12-10 DIAGNOSIS — F4322 Adjustment disorder with anxiety: Secondary | ICD-10-CM | POA: Diagnosis not present

## 2015-12-10 DIAGNOSIS — E039 Hypothyroidism, unspecified: Secondary | ICD-10-CM

## 2015-12-10 DIAGNOSIS — E559 Vitamin D deficiency, unspecified: Secondary | ICD-10-CM | POA: Diagnosis not present

## 2015-12-10 DIAGNOSIS — R002 Palpitations: Secondary | ICD-10-CM

## 2015-12-10 DIAGNOSIS — E785 Hyperlipidemia, unspecified: Secondary | ICD-10-CM | POA: Diagnosis not present

## 2015-12-10 DIAGNOSIS — I10 Essential (primary) hypertension: Secondary | ICD-10-CM

## 2015-12-10 MED ORDER — OXAZEPAM 15 MG PO CAPS: ORAL_CAPSULE | ORAL | Status: DC

## 2015-12-10 NOTE — Addendum Note (Signed)
Addended by: Zannie Cove on: 12/10/2015 04:11 PM   Modules accepted: Orders

## 2015-12-10 NOTE — Patient Instructions (Addendum)
Medicare Annual Wellness Visit  [facility] and the medical providers at [facility] strive to bring you the best medical care.  In doing so we not only want to address your current medical conditions and concerns but also to detect new conditions early and prevent illness, disease and health-related problems.    Medicare offers a yearly Wellness Visit which allows our clinical staff to assess your need for preventative services including immunizations, lifestyle education, counseling to decrease risk of preventable diseases and screening for fall risk and other medical concerns.    This visit is provided free of charge (no copay) for all Medicare recipients. The clinical pharmacists at [facility] have begun to conduct these Wellness Visits which will also include a thorough review of all your medications.    As you primary medical provider recommend that you make an appointment for your Annual Wellness Visit if you have not done so already this year.  You may set up this appointment before you leave today or you may call back [phone] and schedule an appointment.  Please make sure when you call that you mention that you are scheduling your Annual Wellness Visit with the clinical pharmacist so that the appointment may be made for the proper length of time.     Continue current medications. Continue good therapeutic lifestyle changes which include good diet and exercise. Fall precautions discussed with patient. If an FOBT was given today- please return it to our front desk. If you are over 33 years old - you may need Prevnar 60 or the adult Pneumonia vaccine.  **Flu shots are available--- please call and schedule a FLU-CLINIC appointment**  After your visit with [place] today you will receive a survey in the mail or online from [employer] regarding your care with [place]. Please take a moment to fill this out. Your feedback is very  important to [place] as you can help [place] better understand your patient needs as well as improve your experience and satisfaction. WE CARE ABOUT YOU!!!   Continue to watch salt in the diet Take thyroid medicine regularly and we will call with the thyroid profile results as soon as those results become available Watch caffeine intake and drink as little as possible We will arrange for you to have an appointment with the cardiologist to further evaluate your palpitation that you are experiencing. Try to keep a diary of the frequency of heart palpitations and how long they last and when they occur and present this to the cardiologist

## 2015-12-10 NOTE — Progress Notes (Signed)
Subjective:    Patient ID: [name], female    DOB: [birth year], 75 y.o.   MRN: [id number]  HPI  Pt here for follow up and management of chronic medical problems which includes hypothyroid and hyperlipidemia. She is taking medications regularly. The patient is doing well with no specific complaints. She comes to the visit today with her husband. She is laughing and in good spirits. The patient says she feels like her thyroid is still not under good control. We've had trouble managing this recently and had to make several adjustments with her medication. We will make sure that we get a thyroid profile on her today. Patient denies any chest pain. She denies any shortness of breath. She does describe palpitations occasionally at nighttime. This is not a regular thing. She only drinks a small amount of caffeine during the day and a son drop. She does not drink coffee or tea. She says she watches her sodium intake closely. She also complains today of some swelling in her feet at times. She denies any shortness of breath. She has her ongoing problems with swallowing and says she does not need any more dilatations because it never helps. This is no worse than usual. She denies any blood in the stool or black tarry bowel movements and denies abdominal pain. She is passing her water without problems.      Patient Active Problem List   Diagnosis Date Noted  . Hyperlipemia 02/14/2013  . Hypothyroid 02/14/2013  . Vitamin D deficiency 02/14/2013  . Generalized anxiety disorder 02/14/2013  . Constipation   . Esophageal stricture   . Osteoporosis with pathological fracture   . GERD (gastroesophageal reflux disease)    Outpatient Encounter Prescriptions as of 12/10/2015  Medication Sig  . atorvastatin (LIPITOR) 80 MG tablet TAKE 1 TABLET EVERY DAY  . Cholecalciferol (VITAMIN D3) 5000 UNITS TABS Take 1 tablet by mouth daily.    Marland Kitchen ENSURE PLUS (ENSURE PLUS) LIQD Take 237 mLs by mouth daily.  Marland Kitchen  escitalopram (LEXAPRO) 10 MG tablet Take 1 tablet (10 mg total) by mouth daily.  . hydrochlorothiazide (HYDRODIURIL) 25 MG tablet TAKE 1 TABLET EVERY DAY  . levothyroxine (SYNTHROID, LEVOTHROID) 100 MCG tablet Take 1 tablet (100 mcg total) by mouth daily before breakfast. 1/2 tablet on Sunday (Patient taking differently: Take 50 mcg by mouth daily before breakfast. )  . Multiple Vitamin (MULTIVITAMIN WITH MINERALS) TABS tablet Take 1 tablet by mouth daily.  Marland Kitchen omeprazole (PRILOSEC) 20 MG capsule TAKE 1 CAPSULE TWICE DAILY BEFORE MEALS  . oxazepam (SERAX) 15 MG capsule TAKE ONE (1) CAPSULE THREE (3) TIMES EACH DAY  . potassium chloride 20 MEQ/15ML (10%) solution TAKE 1&1/2 TEASPOONFULS DAILY  . sucralfate (CARAFATE) 1 GM/10ML suspension Take 10 mLs (1 g total) by mouth 2 (two) times daily. Before lunch, before supper,and bedtime  . triamcinolone cream (KENALOG) 0.1 %   . triamcinolone cream (KENALOG) 0.5 % Apply 1 application topically 3 (three) times daily.  . [DISCONTINUED] azithromycin (ZITHROMAX) 250 MG tablet 2 pills the first day then one daily for infection until completed  . [DISCONTINUED] predniSONE (DELTASONE) 10 MG tablet 1 tablet 4 times a day for 2 days,  1 tablet 3 times a day for 2 days,  1 tablet 2 times a day for 2 days, 1 tablet daily for 2 days   No facility-administered encounter medications on file as of 12/10/2015.     Review of Systems  Constitutional: Negative.   HENT: Negative.  Eyes: Negative.   Respiratory: Negative.   Cardiovascular: Negative.   Gastrointestinal: Negative.   Endocrine: Negative.   Genitourinary: Negative.   Musculoskeletal: Negative.   Skin: Negative.   Allergic/Immunologic: Negative.   Neurological: Negative.   Hematological: Negative.   Psychiatric/Behavioral: Negative.        Objective:   Physical Exam  Constitutional: She is oriented to person, place, and time. She appears well-developed and well-nourished. No distress.  Small  framed and alert  HENT:  Head: Normocephalic and atraumatic.  Right Ear: External ear normal.  Left Ear: External ear normal.  Nose: Nose normal.  Mouth/Throat: Oropharynx is clear and moist.  Upper dentures  Eyes: Conjunctivae and EOM are normal. Pupils are equal, round, and reactive to light. Right eye exhibits no discharge. Left eye exhibits no discharge. No scleral icterus.  Recent eye exam with new glasses  Neck: Normal range of motion. Neck supple. No thyromegaly present.  Cardiovascular: Normal rate, normal heart sounds and intact distal pulses.   No murmur heard. The heart was slightly irregular at 84/m  Pulmonary/Chest: Effort normal and breath sounds normal. No respiratory distress. She has no wheezes. She has no rales. She exhibits no tenderness.  Clear anteriorly and posteriorly  Abdominal: Soft. Bowel sounds are normal. She exhibits no mass. There is no tenderness. There is no rebound and no guarding.  No liver or spleen enlargement and only slight suprapubic tenderness. No epigastric tenderness  Musculoskeletal: Normal range of motion. She exhibits no edema or tenderness.  No edema detected today.  Lymphadenopathy:    She has no cervical adenopathy.  Neurological: She is alert and oriented to person, place, and time. She has normal reflexes. No cranial nerve deficit.  Skin: Skin is warm and dry. No rash noted.  Psychiatric: She has a normal mood and affect. Her behavior is normal. Judgment and thought content normal.  Nursing note and vitals reviewed.  BP 122/73 mmHg  Pulse 80  Temp(Src) 97.4 F (36.3 C) (Oral)  Ht 5' 2"  (1.575 m)  Wt 113 lb (51.256 kg)  BMI 20.66 kg/m2  EKG: One of 3 EKGs was read as atrial fibrillation and flutter. The rhythm however and this EKG appears pretty regular. This may be just artifact. We will still have a cardiologist to see her to make sure that that the complaints she is having is not related to some cardiac arrhythmia like atrial fib  or flutter.       Assessment & Plan:  1. Vitamin D deficiency -Continue current treatment pending results of lab work - CBC with Differential/Platelet - VITAMIN D 25 Hydroxy (Vit-D Deficiency, Fractures)  2. Hyperlipemia -Continue atorvastatin and aggressive therapeutic lifestyle changes - CBC with Differential/Platelet - Lipid panel  3. Hypothyroidism, unspecified hypothyroidism type -Continue current treatment of thyroid medication which according to the patient is one half daily and none on Sunday - CBC with Differential/Platelet - Thyroid Panel With TSH  4. Essential hypertension -The blood pressure is good today and she will continue with current treatment - BMP8+EGFR - CBC with Differential/Platelet - Hepatic function panel  5. Heart palpitations -We will do a referral to the cardiologist for further evaluation she will continue to watch her caffeine intake  6. Adjustment disorder with anxious mood -Continue with Serax as needed  No orders of the defined types were placed in this encounter.   Patient Instructions  Medicare Annual Wellness Visit  [facility] and the medical providers at [facility] strive to bring you the best medical care.  In doing so we not only want to address your current medical conditions and concerns but also to detect new conditions early and prevent illness, disease and health-related problems.    Medicare offers a yearly Wellness Visit which allows our clinical staff to assess your need for preventative services including immunizations, lifestyle education, counseling to decrease risk of preventable diseases and screening for fall risk and other medical concerns.    This visit is provided free of charge (no copay) for all Medicare recipients. The clinical pharmacists at [facility] have begun to conduct these Wellness Visits which will also include a thorough review of all  your medications.    As you primary medical provider recommend that you make an appointment for your Annual Wellness Visit if you have not done so already this year.  You may set up this appointment before you leave today or you may call back [phone]) and schedule an appointment.  Please make sure when you call that you mention that you are scheduling your Annual Wellness Visit with the clinical pharmacist so that the appointment may be made for the proper length of time.     Continue current medications. Continue good therapeutic lifestyle changes which include good diet and exercise. Fall precautions discussed with patient. If an FOBT was given today- please return it to our front desk. If you are over 44 years old - you may need Prevnar 56 or the adult Pneumonia vaccine.  **Flu shots are available--- please call and schedule a FLU-CLINIC appointment**  After your visit with [place] today you will receive a survey in the mail or online from [employer] regarding your care with [place]. Please take a moment to fill this out. Your feedback is very important to [place] as you can help [place] better understand your patient needs as well as improve your experience and satisfaction. WE CARE ABOUT YOU!!!   Continue to watch salt in the diet Take thyroid medicine regularly and we will call with the thyroid profile results as soon as those results become available Watch caffeine intake and drink as little as possible We will arrange for you to have an appointment with the cardiologist to further evaluate your palpitation that you are experiencing. Try to keep a diary of the frequency of heart palpitations and how long they last and when they occur and present this to the cardiologist   Arrie Senate MD

## 2015-12-11 LAB — HEPATIC FUNCTION PANEL
ALBUMIN: 4.2 g/dL (ref 3.5–4.8)
ALK PHOS: 75 IU/L (ref 39–117)
ALT: 10 IU/L (ref 0–32)
AST: 17 IU/L (ref 0–40)
BILIRUBIN TOTAL: 0.7 mg/dL (ref 0.0–1.2)
Bilirubin, Direct: 0.18 mg/dL (ref 0.00–0.40)
Total Protein: 6.5 g/dL (ref 6.0–8.5)

## 2015-12-11 LAB — CBC WITH DIFFERENTIAL/PLATELET
BASOS: 1 %
Basophils Absolute: 0 10*3/uL (ref 0.0–0.2)
EOS (ABSOLUTE): 0.2 10*3/uL (ref 0.0–0.4)
EOS: 2 %
HEMATOCRIT: 38 % (ref 34.0–46.6)
HEMOGLOBIN: 12.7 g/dL (ref 11.1–15.9)
Immature Grans (Abs): 0 10*3/uL (ref 0.0–0.1)
Immature Granulocytes: 0 %
LYMPHS ABS: 2.4 10*3/uL (ref 0.7–3.1)
Lymphs: 34 %
MCH: 30.2 pg (ref 26.6–33.0)
MCHC: 33.4 g/dL (ref 31.5–35.7)
MCV: 91 fL (ref 79–97)
MONOCYTES: 10 %
MONOS ABS: 0.7 10*3/uL (ref 0.1–0.9)
Neutrophils Absolute: 3.7 10*3/uL (ref 1.4–7.0)
Neutrophils: 53 %
Platelets: 250 10*3/uL (ref 150–379)
RBC: 4.2 x10E6/uL (ref 3.77–5.28)
RDW: 13.4 % (ref 12.3–15.4)
WBC: 6.9 10*3/uL (ref 3.4–10.8)

## 2015-12-11 LAB — BMP8+EGFR
BUN / CREAT RATIO: 20 (ref 11–26)
BUN: 18 mg/dL (ref 8–27)
CO2: 28 mmol/L (ref 18–29)
CREATININE: 0.92 mg/dL (ref 0.57–1.00)
Calcium: 9.4 mg/dL (ref 8.7–10.3)
Chloride: 98 mmol/L (ref 96–106)
GFR, EST AFRICAN AMERICAN: 70 mL/min/{1.73_m2} (ref >59)
GFR, EST NON AFRICAN AMERICAN: 61 mL/min/{1.73_m2} (ref >59)
GLUCOSE: 93 mg/dL (ref 65–99)
Potassium: 3.6 mmol/L (ref 3.5–5.2)
SODIUM: 142 mmol/L (ref 134–144)

## 2015-12-11 LAB — THYROID PANEL WITH TSH
FREE THYROXINE INDEX: 2.6 (ref 1.2–4.9)
T3 UPTAKE RATIO: 26 % (ref 24–39)
T4, Total: 9.9 ug/dL (ref 4.5–12.0)
TSH: 0.42 u[IU]/mL — AB (ref 0.450–4.500)

## 2015-12-11 LAB — LIPID PANEL
CHOL/HDL RATIO: 2.7 ratio (ref 0.0–4.4)
Cholesterol, Total: 142 mg/dL (ref 100–199)
HDL: 53 mg/dL (ref >39)
LDL Calculated: 69 mg/dL (ref 0–99)
TRIGLYCERIDES: 98 mg/dL (ref 0–149)
VLDL Cholesterol Cal: 20 mg/dL (ref 5–40)

## 2015-12-11 LAB — VITAMIN D 25 HYDROXY (VIT D DEFICIENCY, FRACTURES): VIT D 25 HYDROXY: 30.3 ng/mL (ref 30.0–100.0)

## 2015-12-15 DIAGNOSIS — L299 Pruritus, unspecified: Secondary | ICD-10-CM | POA: Diagnosis not present

## 2015-12-15 DIAGNOSIS — Z85828 Personal history of other malignant neoplasm of skin: Secondary | ICD-10-CM | POA: Diagnosis not present

## 2015-12-15 DIAGNOSIS — D485 Neoplasm of uncertain behavior of skin: Secondary | ICD-10-CM | POA: Diagnosis not present

## 2015-12-15 DIAGNOSIS — L570 Actinic keratosis: Secondary | ICD-10-CM | POA: Diagnosis not present

## 2015-12-15 NOTE — Progress Notes (Signed)
Cardiology Office Note   Date:  12/18/2015   ID:  [name], DOB Nov 12, 1940, MRN [id number]  PCP:  Redge Gainer, MD  Cardiologist:   Sharol Harness, MD   Chief Complaint  Patient presents with  . New Evaluation    Referred by PCP for Palpitations--EKG 12/10/15//pt c/o skipped beats and swelling in bilateral legs/feet/ankles    History of Present Illness: [name] is a 75 y.o. female with hyperlipidemia, anxiety and GERD who presents for an evaluation of palpitations. [name] developed palpitations after arguing with her husband.  They lasted for approximately one minute.  There was no associated shortness of breath or chest pain.  She did note lightheadedness and dizziness.  She notes one episode of syncope several years ago.  These episodes occur less than once per month.  She reported these symptoms to her PCP, Dr. Laurance Flatten, who referred her for evaluation. At that appointment her BMP, CBC, LFTs and lipids were all normal.  Her TSH was slightly low, so her synthroid was increased slightly.  Free T3 and T4 were normal.  [name] denies any chest pain or pressure.  She walks regularly for exercise.  She has some chronic lower extremity edema but denies shortness of breath, orthopnea or PND.      Past Medical History  Diagnosis Date  . Unspecified hypothyroidism   . Anxiety states   . Other and unspecified hyperlipidemia   . Hiatal hernia   . Constipation   . Esophageal stricture   . Osteoporosis   . GERD (gastroesophageal reflux disease)   . Vertebral fracture, osteoporotic ([place])   . Fracture, rib   . Cataract   . Cancer ([place])     skin  . Breast cyst   . Essential hypertension 12/18/2015  . Lower extremity edema 12/18/2015  . Palpitations 12/18/2015    Past Surgical History  Procedure Laterality Date  . Partial hysterectomy    . Cataract extraction    . Thyroid lobectomy      right  . Breast biopsy    . Eye surgery    . Hand surgery        Current Outpatient Prescriptions  Medication Sig Dispense Refill  . atorvastatin (LIPITOR) 80 MG tablet TAKE 1 TABLET EVERY DAY 90 tablet 1  . Cholecalciferol (VITAMIN D3) 5000 UNITS TABS Take 1 tablet by mouth daily.      Marland Kitchen ENSURE PLUS (ENSURE PLUS) LIQD Take 237 mLs by mouth daily.    Marland Kitchen escitalopram (LEXAPRO) 10 MG tablet Take 1 tablet (10 mg total) by mouth daily. 30 tablet 2  . levothyroxine (SYNTHROID, LEVOTHROID) 100 MCG tablet Take 1 tablet (100 mcg total) by mouth daily before breakfast. 1/2 tablet on Sunday (Patient taking differently: Take 50 mcg by mouth daily before breakfast. ) 90 tablet 3  . Multiple Vitamin (MULTIVITAMIN WITH MINERALS) TABS tablet Take 1 tablet by mouth daily.    Marland Kitchen oxazepam (SERAX) 15 MG capsule TAKE ONE (1) CAPSULE THREE (3) TIMES EACH DAY 90 capsule 1  . potassium chloride 20 MEQ/15ML (10%) solution TAKE 1&1/2 TEASPOONFULS DAILY 473 mL 1  . sucralfate (CARAFATE) 1 GM/10ML suspension Take 10 mLs (1 g total) by mouth 2 (two) times daily. Before lunch, before supper,and bedtime 420 mL 1  . triamcinolone cream (KENALOG) 0.1 %     . triamcinolone cream (KENALOG) 0.5 % Apply 1 application topically 3 (three) times daily. 90 g 1  . furosemide (LASIX) 20 MG tablet  Take 1 tablet (20 mg total) by mouth daily. 90 tablet 3  . lisinopril (PRINIVIL,ZESTRIL) 10 MG tablet Take 1 tablet (10 mg total) by mouth daily. 90 tablet 3   No current facility-administered medications for this visit.    Allergies:   Bisphosphonates; Ciprofloxacin; Codeine; and Sulfa antibiotics    Social History:  The patient  reports that she has quit smoking. She has never used smokeless tobacco. She reports that she does not drink alcohol or use illicit drugs.   Family History:  The patient's family history includes Alcohol abuse in her father; Cancer in her brother, brother, and sister; Early death in her brother; Goiter in her father; Heart disease in her brother and sister; Hernia in her  brother.    ROS:  Please see the history of present illness.   Otherwise, review of systems are positive for none.   All other systems are reviewed and negative.    PHYSICAL EXAM: VS:  BP 112/78 mmHg  Pulse 84  Ht 5\' 2"  (1.575 m)  Wt 51.393 kg (113 lb 4.8 oz)  BMI 20.72 kg/m2 , BMI Body mass index is 20.72 kg/(m^2). GENERAL:  Well appearing.  Loquacious and very friendly. HEENT:  Pupils equal round and reactive, fundi not visualized, oral mucosa unremarkable NECK:  No JVD, waveform within normal limits, carotid upstroke brisk and symmetric, no bruits, no thyromegaly LYMPHATICS:  No cervical adenopathy LUNGS:  Clear to auscultation bilaterally HEART:  RRR.  PMI not displaced or sustained,S1 and S2 within normal limits, no S3, no S4, no clicks, no rubs, no murmurs ABD:  Flat, positive bowel sounds normal in frequency in pitch, no bruits, no rebound, no guarding, no midline pulsatile mass, no hepatomegaly, no splenomegaly EXT:  2 plus pulses throughout, 1+ pitting edema to the lower tibial bilaterally, no cyanosis no clubbing SKIN:  No rashes no nodules NEURO:  Cranial nerves II through XII grossly intact, motor grossly intact throughout PSYCH:  Cognitively intact, oriented to person place and time  EKG:  EKG is ordered today. The ekg ordered today demonstrates sinus rhythm with PACs.  Rate 89 bpm.     Recent Labs: 03/31/2015: Hemoglobin 12.9 12/10/2015: ALT 10; BUN 18; Creatinine, Ser 0.92; Platelets 250; Potassium 3.6; Sodium 142; TSH 0.420*    Lipid Panel    Component Value Date/Time   CHOL 142 12/10/2015 1601   CHOL 164 08/06/2015 1221   CHOL 122 07/23/2013 0858   TRIG 98 12/10/2015 1601   TRIG 116 08/06/2015 1221   TRIG 89 07/23/2013 0858   HDL 53 12/10/2015 1601   HDL 56 08/06/2015 1221   HDL 47 07/23/2013 0858   CHOLHDL 2.7 12/10/2015 1601   LDLCALC 69 12/10/2015 1601   LDLCALC 63 07/17/2014 0929   LDLCALC 57 07/23/2013 0858      Wt Readings from Last 3  Encounters:  12/16/15 51.393 kg (113 lb 4.8 oz)  12/10/15 51.256 kg (113 lb)  08/22/15 49.442 kg (109 lb)      ASSESSMENT AND PLAN:  # Hypertension: [name]'s blood pressure is well-controlled.  However, she has lower extremity edema on HCTZ.  We will switch HCTZ to lisinopril and start lasix 20 mg daily.  Repeat BMP in 2 weeks.  - stop HCTZ and start lisinipril 10 mg daily  # LE edema:  [name] has some lower extremity edema, yet her no JVD and her lungs are clear.  I suspect that this is mostly due to venous insufficiency.  I recommended  that she start wearing compression stockings.  She will also start lasix 20 mg daily.  Consider echo at the next appointment if her symptoms are not improving.  # Palpitations: [name] reports intermittent palpitations that occur less than once per month.  They last for seconds and there is no hemodynamic compromise.  Her thyroid medication at this time.  Her other labs (CMP, CBC) are unremarkable.  Given that they are occurring so infrequently, we will not order ambulatory monitoring at this time.  # Hyperlipidemia:  Continue atorvastatin.  LDL well-controlled.  Current medicines are reviewed at length with the patient today.  The patient does not have concerns regarding medicines.  The following changes have been made:  Stop HCTZ and start lisinopril and lasix.  Labs/ tests ordered today include:  No orders of the defined types were placed in this encounter.     Disposition:   FU with Twala Collings C. Oval Linsey, MD, [facility] in 2 weeks.    This note was written with the assistance of speech recognition software.  Please excuse any transcriptional errors.  Signed, Ryleigh Buenger C. Oval Linsey, MD, [facility]  12/18/2015 8:32 AM    [facility]

## 2015-12-16 ENCOUNTER — Encounter: Payer: Self-pay | Admitting: Cardiovascular Disease

## 2015-12-16 ENCOUNTER — Ambulatory Visit (INDEPENDENT_AMBULATORY_CARE_PROVIDER_SITE_OTHER): Payer: Commercial Managed Care - HMO | Admitting: Cardiovascular Disease

## 2015-12-16 VITALS — BP 112/78 | HR 84 | Ht 62.0 in | Wt 113.3 lb

## 2015-12-16 DIAGNOSIS — E785 Hyperlipidemia, unspecified: Secondary | ICD-10-CM

## 2015-12-16 DIAGNOSIS — R002 Palpitations: Secondary | ICD-10-CM | POA: Diagnosis not present

## 2015-12-16 DIAGNOSIS — I10 Essential (primary) hypertension: Secondary | ICD-10-CM

## 2015-12-16 DIAGNOSIS — R600 Localized edema: Secondary | ICD-10-CM

## 2015-12-16 MED ORDER — LISINOPRIL 10 MG PO TABS: 10.0000 mg | ORAL_TABLET | Freq: Every day | ORAL | Status: DC

## 2015-12-16 MED ORDER — FUROSEMIDE 20 MG PO TABS: 20.0000 mg | ORAL_TABLET | Freq: Every day | ORAL | Status: DC

## 2015-12-16 NOTE — Patient Instructions (Addendum)
Dr Oval Linsey has recommended making the following medication changes: STOP HCT START Lisinopril 10 mg - take 1 tablet by mouth daily START Furosemide 20 mg - take 1 tablet by mouth daily  Dr Oval Linsey recommends that you schedule a follow-up appointment in 2 weeks.  If you need a refill on your cardiac medications before your next appointment, please call your pharmacy.  You can get compression stockings in [place] at [employer] # [phone].

## 2015-12-18 ENCOUNTER — Encounter: Payer: Self-pay | Admitting: Cardiovascular Disease

## 2015-12-18 DIAGNOSIS — R600 Localized edema: Secondary | ICD-10-CM | POA: Insufficient documentation

## 2015-12-18 DIAGNOSIS — I10 Essential (primary) hypertension: Secondary | ICD-10-CM

## 2015-12-18 DIAGNOSIS — R002 Palpitations: Secondary | ICD-10-CM | POA: Insufficient documentation

## 2015-12-18 HISTORY — DX: Essential (primary) hypertension: I10

## 2015-12-18 HISTORY — DX: Localized edema: R60.0

## 2015-12-18 HISTORY — DX: Palpitations: R00.2

## 2015-12-19 ENCOUNTER — Other Ambulatory Visit: Payer: Self-pay | Admitting: Family Medicine

## 2015-12-24 NOTE — Addendum Note (Signed)
Addended by: Therisa Doyne on: 12/24/2015 03:59 PM   Modules accepted: Orders

## 2015-12-30 ENCOUNTER — Encounter: Payer: Self-pay | Admitting: Cardiovascular Disease

## 2015-12-30 ENCOUNTER — Ambulatory Visit (INDEPENDENT_AMBULATORY_CARE_PROVIDER_SITE_OTHER): Payer: Commercial Managed Care - HMO | Admitting: Cardiovascular Disease

## 2015-12-30 VITALS — BP 84/60 | HR 64 | Ht 61.25 in | Wt 112.7 lb

## 2015-12-30 DIAGNOSIS — R600 Localized edema: Secondary | ICD-10-CM

## 2015-12-30 DIAGNOSIS — I10 Essential (primary) hypertension: Secondary | ICD-10-CM | POA: Diagnosis not present

## 2015-12-30 DIAGNOSIS — E785 Hyperlipidemia, unspecified: Secondary | ICD-10-CM

## 2015-12-30 DIAGNOSIS — I491 Atrial premature depolarization: Secondary | ICD-10-CM | POA: Diagnosis not present

## 2015-12-30 HISTORY — DX: Atrial premature depolarization: I49.1

## 2015-12-30 MED ORDER — METOPROLOL SUCCINATE ER 25 MG PO TB24: ORAL_TABLET | ORAL | Status: DC

## 2015-12-30 NOTE — Progress Notes (Signed)
Cardiology Office Note   Date:  12/30/2015   ID:  ISHANI KLINGELE, DOB 1941-09-25, MRN [id number]  PCP:  Redge Gainer, MD  Cardiologist:   Sharol Harness, MD   Chief Complaint  Patient presents with  . Follow-up    2 weeks--med changes  pt c/o occasional SOB, swelling is better.    Patient ID: MCCAYLA KELLOUGH is a 75 y.o. female with hyperlipidemia, anxiety and GERD who presents for an evaluation of palpitations.  Interval History 12/30/15: At her last appointment HCTZ was stopped and she was started on lisinopril and lasix due to lower extremity edema.  It was also recommended that she wear compression stockings. Ms. Hathway has been doing very well.  She had a good response to lasix and notes that her edema has improved.  She tries to wear compression stockings but they are too tight.  She continues to have palpitations once or twice per month.  They only last for a few moments and are not associated with chest pain, shortness of breath, lightheadedness or dizziness.  She denies orthopnea or PND.    History of Present Illness 12/16/15:  Ms. Chambless developed palpitations after arguing with her husband.  They lasted for approximately one minute.  There was no associated shortness of breath or chest pain.  She did note lightheadedness and dizziness.  She notes one episode of syncope several years ago.  These episodes occur less than once per month.  She reported these symptoms to her PCP, Dr. Laurance Flatten, who referred her for evaluation. At that appointment her BMP, CBC, LFTs and lipids were all normal.  Her TSH was slightly low, so her synthroid was increased slightly.  Free T3 and T4 were normal.  Ms. Steuer denies any chest pain or pressure.  She walks regularly for exercise.  She has some chronic lower extremity edema but denies shortness of breath, orthopnea or PND.      Past Medical History  Diagnosis Date  . Unspecified hypothyroidism   . Anxiety states   . Other and  unspecified hyperlipidemia   . Hiatal hernia   . Constipation   . Esophageal stricture   . Osteoporosis   . GERD (gastroesophageal reflux disease)   . Vertebral fracture, osteoporotic ([place])   . Fracture, rib   . Cataract   . Cancer ([place])     skin  . Breast cyst   . Essential hypertension 12/18/2015  . Lower extremity edema 12/18/2015  . Palpitations 12/18/2015  . PAC (premature atrial contraction) 12/30/2015    Past Surgical History  Procedure Laterality Date  . Partial hysterectomy    . Cataract extraction    . Thyroid lobectomy      right  . Breast biopsy    . Eye surgery    . Hand surgery       Current Outpatient Prescriptions  Medication Sig Dispense Refill  . atorvastatin (LIPITOR) 80 MG tablet TAKE 1 TABLET EVERY DAY 90 tablet 1  . Cholecalciferol (VITAMIN D3) 5000 UNITS TABS Take 1 tablet by mouth daily.      Marland Kitchen ENSURE PLUS (ENSURE PLUS) LIQD Take 237 mLs by mouth daily.    Marland Kitchen escitalopram (LEXAPRO) 10 MG tablet Take 1 tablet (10 mg total) by mouth daily. 30 tablet 2  . furosemide (LASIX) 20 MG tablet Take 20 mg by mouth as needed (FOR SWELLING).    Marland Kitchen levothyroxine (SYNTHROID, LEVOTHROID) 100 MCG tablet Take 1 tablet (100 mcg total) by mouth daily  before breakfast. 1/2 tablet on Sunday (Patient taking differently: Take 50 mcg by mouth daily before breakfast. ) 90 tablet 3  . Multiple Vitamin (MULTIVITAMIN WITH MINERALS) TABS tablet Take 1 tablet by mouth daily.    Marland Kitchen oxazepam (SERAX) 15 MG capsule TAKE ONE (1) CAPSULE THREE (3) TIMES EACH DAY 90 capsule 1  . potassium chloride 20 MEQ/15ML (10%) solution TAKE 1&1/2 TEASPOONFULS DAILY 473 mL 1  . sucralfate (CARAFATE) 1 GM/10ML suspension Take 10 mLs (1 g total) by mouth 2 (two) times daily. Before lunch, before supper,and bedtime 420 mL 1  . triamcinolone cream (KENALOG) 0.5 % Apply 1 application topically 3 (three) times daily. 90 g 1  . metoprolol succinate (TOPROL XL) 25 MG 24 hr tablet 1/2 TABLET BY MOUTH DAILY 15 tablet 3     No current facility-administered medications for this visit.    Allergies:   Bisphosphonates; Ciprofloxacin; Codeine; and Sulfa antibiotics    Social History:  The patient  reports that she has quit smoking. She has never used smokeless tobacco. She reports that she does not drink alcohol or use illicit drugs.   Family History:  The patient's family history includes Alcohol abuse in her father; Cancer in her brother, brother, and sister; Early death in her brother; Goiter in her father; Heart disease in her brother and sister; Hernia in her brother.    ROS:  Please see the history of present illness.   Otherwise, review of systems are positive for none.   All other systems are reviewed and negative.    PHYSICAL EXAM: VS:  BP 84/60 mmHg  Pulse 64  Ht 5' 1.25" (1.556 m)  Wt 51.12 kg (112 lb 11.2 oz)  BMI 21.11 kg/m2 , BMI Body mass index is 21.11 kg/(m^2). GENERAL:  Well appearing.  Loquacious and very friendly. HEENT:  Pupils equal round and reactive, fundi not visualized, oral mucosa unremarkable NECK:  No JVD, waveform within normal limits, carotid upstroke brisk and symmetric, no bruits, no thyromegaly LYMPHATICS:  No cervical adenopathy LUNGS:  Clear to auscultation bilaterally HEART:  Irregularly irregular.  PMI not displaced or sustained,S1 and S2 within normal limits, no S3, no S4, no clicks, no rubs, no murmurs ABD:  Flat, positive bowel sounds normal in frequency in pitch, no bruits, no rebound, no guarding, no midline pulsatile mass, no hepatomegaly, no splenomegaly EXT:  2 plus pulses throughout, no edema, no cyanosis no clubbing SKIN:  No rashes no nodules NEURO:  Cranial nerves II through XII grossly intact, motor grossly intact throughout PSYCH:  Cognitively intact, oriented to person place and time  EKG:  EKG is ordered today. The ekg ordered today demonstrates sinus rhythm with PACs.  Rate 89 bpm.     Recent Labs: 03/31/2015: Hemoglobin 12.9 12/10/2015: ALT 10;  BUN 18; Creatinine, Ser 0.92; Platelets 250; Potassium 3.6; Sodium 142; TSH 0.420*    Lipid Panel    Component Value Date/Time   CHOL 142 12/10/2015 1601   CHOL 164 08/06/2015 1221   CHOL 122 07/23/2013 0858   TRIG 98 12/10/2015 1601   TRIG 116 08/06/2015 1221   TRIG 89 07/23/2013 0858   HDL 53 12/10/2015 1601   HDL 56 08/06/2015 1221   HDL 47 07/23/2013 0858   CHOLHDL 2.7 12/10/2015 1601   LDLCALC 69 12/10/2015 1601   LDLCALC 63 07/17/2014 0929   LDLCALC 57 07/23/2013 0858      Wt Readings from Last 3 Encounters:  12/30/15 51.12 kg (112 lb 11.2 oz)  12/16/15 51.393 kg (113 lb 4.8 oz)  12/10/15 51.256 kg (113 lb)      ASSESSMENT AND PLAN:  # Hypertension: Ms. Benett's blood pressure is too low, thought she is asymptomatic.  Given her low BP and persistent palpitations we will stop lisinopril and start metoprolol 12.5 mg daily.  She will use lasix as needed instead of daily.  # LE edema:  Resolved with lasix.  She does not tolerate compression stockings.  She has no JVD or other evidence of heart failure.  Given her hypotension we will switch lasix to as needed.   # PACs, palpitations: Ms. Strelow continues to have palpitations and has PACs on EKG.  She continues to have irregularity today.  We will switch her lisinopril to metoprolol as above.  # Hyperlipidemia:  Continue atorvastatin.  LDL well-controlled.  Current medicines are reviewed at length with the patient today.  The patient does not have concerns regarding medicines.  The following changes have been made:  Stop lisinopril.  Start metoprolol XL 12.5 mg daily.   Labs/ tests ordered today include:  No orders of the defined types were placed in this encounter.     Disposition:   FU with Tanji Storrs C. Oval Linsey, MD, [facility] in 1 month.   This note was written with the assistance of speech recognition software.  Please excuse any transcriptional errors.  Signed, Dierra Riesgo C. Oval Linsey, MD, [facility]  12/30/2015 2:32 PM      [facility]

## 2015-12-30 NOTE — Patient Instructions (Signed)
Medication Instructions:  CHANGE YOUR FUROSEMIDE (LASIX) TO AS NEEDED FOR SWELLING  STOP LISINOPRIL   START METOPROLOL SUCC (TOPROL) 25 MG 1/2 TABLET BY DAILY  Labwork: NONE  Testing/Procedures: NONE  Follow-Up: Your physician recommends that you schedule a follow-up appointment in: 1 MONTH   If you need a refill on your cardiac medications before your next appointment, please call your pharmacy.

## 2016-01-16 ENCOUNTER — Encounter: Payer: Commercial Managed Care - HMO | Admitting: *Deleted

## 2016-01-16 DIAGNOSIS — Z1231 Encounter for screening mammogram for malignant neoplasm of breast: Secondary | ICD-10-CM | POA: Diagnosis not present

## 2016-01-16 LAB — HM MAMMOGRAPHY

## 2016-01-26 ENCOUNTER — Other Ambulatory Visit: Payer: Commercial Managed Care - HMO

## 2016-01-26 DIAGNOSIS — E039 Hypothyroidism, unspecified: Secondary | ICD-10-CM

## 2016-01-27 LAB — THYROID PANEL WITH TSH
Free Thyroxine Index: 2.4 (ref 1.2–4.9)
T3 UPTAKE RATIO: 23 % — AB (ref 24–39)
T4 TOTAL: 10.6 ug/dL (ref 4.5–12.0)
TSH: 0.33 u[IU]/mL — AB (ref 0.450–4.500)

## 2016-01-28 ENCOUNTER — Encounter: Payer: Self-pay | Admitting: *Deleted

## 2016-02-09 ENCOUNTER — Ambulatory Visit: Payer: Commercial Managed Care - HMO | Admitting: Cardiovascular Disease

## 2016-02-24 ENCOUNTER — Telehealth: Payer: Self-pay | Admitting: Cardiovascular Disease

## 2016-02-27 ENCOUNTER — Ambulatory Visit: Payer: Commercial Managed Care - HMO | Admitting: Cardiovascular Disease

## 2016-02-27 NOTE — Telephone Encounter (Signed)
Closed Encounter  °

## 2016-03-12 ENCOUNTER — Ambulatory Visit: Payer: Commercial Managed Care - HMO | Admitting: Cardiovascular Disease

## 2016-03-31 ENCOUNTER — Other Ambulatory Visit: Payer: Self-pay | Admitting: Family Medicine

## 2016-04-02 ENCOUNTER — Ambulatory Visit: Payer: Commercial Managed Care - HMO | Admitting: Cardiovascular Disease

## 2016-04-15 ENCOUNTER — Encounter: Payer: Self-pay | Admitting: Family Medicine

## 2016-04-15 ENCOUNTER — Ambulatory Visit (INDEPENDENT_AMBULATORY_CARE_PROVIDER_SITE_OTHER): Payer: Commercial Managed Care - HMO | Admitting: Family Medicine

## 2016-04-15 VITALS — BP 122/77 | HR 82 | Temp 97.7°F | Ht 61.25 in | Wt 111.0 lb

## 2016-04-15 DIAGNOSIS — E785 Hyperlipidemia, unspecified: Secondary | ICD-10-CM | POA: Diagnosis not present

## 2016-04-15 DIAGNOSIS — I10 Essential (primary) hypertension: Secondary | ICD-10-CM | POA: Diagnosis not present

## 2016-04-15 DIAGNOSIS — R109 Unspecified abdominal pain: Secondary | ICD-10-CM

## 2016-04-15 DIAGNOSIS — E559 Vitamin D deficiency, unspecified: Secondary | ICD-10-CM | POA: Diagnosis not present

## 2016-04-15 DIAGNOSIS — E039 Hypothyroidism, unspecified: Secondary | ICD-10-CM

## 2016-04-15 MED ORDER — SUCRALFATE 1 G PO TABS: 1.0000 g | ORAL_TABLET | Freq: Two times a day (BID) | ORAL | Status: DC

## 2016-04-15 MED ORDER — OXAZEPAM 15 MG PO CAPS: ORAL_CAPSULE | ORAL | Status: DC

## 2016-04-15 NOTE — Progress Notes (Signed)
Subjective:    Patient ID: [name], female    DOB: 1941/08/14, 75 y.o.   MRN: [id number]  HPI Pt here for follow up and management of chronic medical problems which includes hypothyroid and hyperlipidemia. She is taking medications regularly.Patient is doing well overall. She is due to get lab work today. She is requesting refills on Carafate and Serax. The patient complains of some right-sided chest pain under her ribs at times. This is been worse since a fall several months ago. She also has a somewhat kyphotic posture. She denies any chest pain shortness of breath trouble swallowing heartburn indigestion nausea vomiting diarrhea blood in the stool or black tarry bowel movements. She also denies any problems with voiding. She just had her eyes checked and got new glasses. She does take Carafate regularly and this prevents heartburn. With this patient with have a lot of trouble controlling her thyroid and we will make sure we check a thyroid profile today.    Patient Active Problem List   Diagnosis Date Noted  . PAC (premature atrial contraction) 12/30/2015  . Essential hypertension 12/18/2015  . Lower extremity edema 12/18/2015  . Palpitations 12/18/2015  . Hyperlipemia 02/14/2013  . Hypothyroid 02/14/2013  . Vitamin D deficiency 02/14/2013  . Generalized anxiety disorder 02/14/2013  . Constipation   . Esophageal stricture   . Osteoporosis with pathological fracture   . GERD (gastroesophageal reflux disease)    Outpatient Encounter Prescriptions as of 04/15/2016  Medication Sig  . atorvastatin (LIPITOR) 80 MG tablet TAKE 1 TABLET EVERY DAY  . Cholecalciferol (VITAMIN D3) 5000 UNITS TABS Take 1 tablet by mouth daily.    Marland Kitchen ENSURE PLUS (ENSURE PLUS) LIQD Take 237 mLs by mouth daily.  Marland Kitchen levothyroxine (SYNTHROID, LEVOTHROID) 100 MCG tablet Take 1 tablet (100 mcg total) by mouth daily before breakfast. 1/2 tablet on Sunday (Patient taking differently: Take 50 mcg by mouth daily  before breakfast. )  . Multiple Vitamin (MULTIVITAMIN WITH MINERALS) TABS tablet Take 1 tablet by mouth daily.  Marland Kitchen oxazepam (SERAX) 15 MG capsule TAKE ONE (1) CAPSULE THREE (3) TIMES EACH DAY  . potassium chloride 20 MEQ/15ML (10%) solution TAKE 1&1/2 TEASPOONFULS DAILY  . sucralfate (CARAFATE) 1 g tablet Take 1 tablet (1 g total) by mouth 2 (two) times daily.  . [DISCONTINUED] escitalopram (LEXAPRO) 10 MG tablet Take 1 tablet (10 mg total) by mouth daily.  . [DISCONTINUED] sucralfate (CARAFATE) 1 g tablet Take 1 g by mouth 2 (two) times daily.  . [DISCONTINUED] sucralfate (CARAFATE) 1 GM/10ML suspension Take 10 mLs (1 g total) by mouth 2 (two) times daily. Before lunch, before supper,and bedtime  . [DISCONTINUED] furosemide (LASIX) 20 MG tablet Take 20 mg by mouth as needed (FOR SWELLING). Reported on 04/15/2016  . [DISCONTINUED] metoprolol succinate (TOPROL XL) 25 MG 24 hr tablet 1/2 TABLET BY MOUTH DAILY (Patient not taking: Reported on 04/15/2016)  . [DISCONTINUED] triamcinolone cream (KENALOG) 0.5 % Apply 1 application topically 3 (three) times daily.   No facility-administered encounter medications on file as of 04/15/2016.      Review of Systems  Constitutional: Negative.   HENT: Negative.   Eyes: Negative.   Respiratory: Negative.   Cardiovascular: Negative.   Gastrointestinal: Negative.   Endocrine: Negative.   Genitourinary: Negative.   Musculoskeletal: Negative.   Skin: Negative.   Allergic/Immunologic: Negative.   Neurological: Negative.   Hematological: Negative.   Psychiatric/Behavioral: Negative.        Objective:   Physical Exam  Constitutional: She is oriented to person, place, and time. She appears well-developed and well-nourished.  Alert and small framed  HENT:  Head: Normocephalic and atraumatic.  Right Ear: External ear normal.  Left Ear: External ear normal.  Nose: Nose normal.  Mouth/Throat: Oropharynx is clear and moist.  Eyes: Conjunctivae and EOM are  normal. Pupils are equal, round, and reactive to light. Right eye exhibits no discharge. Left eye exhibits no discharge. No scleral icterus.  Neck: Normal range of motion. Neck supple. No thyromegaly present.  No bruits or thyromegaly  Cardiovascular: Normal rate, regular rhythm, normal heart sounds and intact distal pulses.   No murmur heard. Heart is 72/m with a regular rate and rhythm  Pulmonary/Chest: Effort normal and breath sounds normal. No respiratory distress. She has no wheezes. She has no rales. She exhibits no tenderness.  Abdominal: Soft. Bowel sounds are normal. She exhibits no mass. There is no tenderness. There is no rebound and no guarding.  Musculoskeletal: Normal range of motion. She exhibits no edema or tenderness.  Lymphadenopathy:    She has no cervical adenopathy.  Neurological: She is alert and oriented to person, place, and time.  Skin: Skin is warm and dry. No rash noted.  Psychiatric: She has a normal mood and affect. Her behavior is normal. Judgment and thought content normal.  Nursing note and vitals reviewed.  BP 122/77 mmHg  Pulse 82  Temp(Src) 97.7 F (36.5 C) (Oral)  Ht 5' 1.25" (1.556 m)  Wt 111 lb (50.349 kg)  BMI 20.80 kg/m2        Assessment & Plan:  1. Vitamin D deficiency -Continue current treatment pending results of lab work - CBC with Differential/Platelet - VITAMIN D 25 Hydroxy (Vit-D Deficiency, Fractures)  2. Hyperlipemia -Continue current treatment pending results of lab work - CBC with Differential/Platelet - Lipid panel  3. Hypothyroidism, unspecified hypothyroidism type -We've had trouble controlling her thyroid over several months and we will check another thyroid profile to make sure that she is on the correct dose. - CBC with Differential/Platelet  4. Essential hypertension -Blood pressure is good today and she will continue with current treatment and sodium restriction - CBC with Differential/Platelet - BMP8+EGFR -  Hepatic function panel  5. Right sided abdominal pain -This is most likely musculoskeletal in nature as she has not had any nausea or vomiting or nausea associated with eating fried foods or pain associated with eating fried foods.  Meds ordered this encounter  Medications  . DISCONTD: sucralfate (CARAFATE) 1 g tablet    Sig: Take 1 g by mouth 2 (two) times daily.  . sucralfate (CARAFATE) 1 g tablet    Sig: Take 1 tablet (1 g total) by mouth 2 (two) times daily.    Dispense:  180 tablet    Refill:  2  . oxazepam (SERAX) 15 MG capsule    Sig: TAKE ONE (1) CAPSULE THREE (3) TIMES EACH DAY    Dispense:  90 capsule    Refill:  1   Patient Instructions                       Medicare Annual Wellness Visit  [facility] and the medical providers at [facility] strive to bring you the best medical care.  In doing so we not only want to address your current medical conditions and concerns but also to detect new conditions early and prevent illness, disease and health-related problems.    Medicare  offers a yearly Wellness Visit which allows our clinical staff to assess your need for preventative services including immunizations, lifestyle education, counseling to decrease risk of preventable diseases and screening for fall risk and other medical concerns.    This visit is provided free of charge (no copay) for all Medicare recipients. The clinical pharmacists at [facility] have begun to conduct these Wellness Visits which will also include a thorough review of all your medications.    As you primary medical provider recommend that you make an appointment for your Annual Wellness Visit if you have not done so already this year.  You may set up this appointment before you leave today or you may call back ([phone]) and schedule an appointment.  Please make sure when you call that you mention that you are scheduling your Annual Wellness Visit with the  clinical pharmacist so that the appointment may be made for the proper length of time.     Continue current medications. Continue good therapeutic lifestyle changes which include good diet and exercise. Fall precautions discussed with patient. If an FOBT was given today- please return it to our front desk. If you are over 25 years old - you may need Prevnar 4 or the adult Pneumonia vaccine.  **Flu shots are available--- please call and schedule a FLU-CLINIC appointment**  After your visit with [place] today you will receive a survey in the mail or online from [employer] regarding your care with [place]. Please take a moment to fill this out. Your feedback is very important to [place] as you can help [place] better understand your patient needs as well as improve your experience and satisfaction. WE CARE ABOUT YOU!!!   Stay active and continue to drink plenty of fluids B careful do not put yourself at risk for falling     Arrie Senate MD

## 2016-04-15 NOTE — Patient Instructions (Addendum)
Medicare Annual Wellness Visit  [facility] and the medical providers at [facility] strive to bring you the best medical care.  In doing so we not only want to address your current medical conditions and concerns but also to detect new conditions early and prevent illness, disease and health-related problems.    Medicare offers a yearly Wellness Visit which allows our clinical staff to assess your need for preventative services including immunizations, lifestyle education, counseling to decrease risk of preventable diseases and screening for fall risk and other medical concerns.    This visit is provided free of charge (no copay) for all Medicare recipients. The clinical pharmacists at [facility] have begun to conduct these Wellness Visits which will also include a thorough review of all your medications.    As you primary medical provider recommend that you make an appointment for your Annual Wellness Visit if you have not done so already this year.  You may set up this appointment before you leave today or you may call back [phone] and schedule an appointment.  Please make sure when you call that you mention that you are scheduling your Annual Wellness Visit with the clinical pharmacist so that the appointment may be made for the proper length of time.     Continue current medications. Continue good therapeutic lifestyle changes which include good diet and exercise. Fall precautions discussed with patient. If an FOBT was given today- please return it to our front desk. If you are over 37 years old - you may need Prevnar 2 or the adult Pneumonia vaccine.  **Flu shots are available--- please call and schedule a FLU-CLINIC appointment**  After your visit with [place] today you will receive a survey in the mail or online from [employer] regarding your care with [place]. Please take a moment to fill this out. Your feedback is very  important to [place] as you can help [place] better understand your patient needs as well as improve your experience and satisfaction. WE CARE ABOUT YOU!!!   Stay active and continue to drink plenty of fluids B careful do not put yourself at risk for falling

## 2016-04-16 ENCOUNTER — Encounter: Payer: Self-pay | Admitting: *Deleted

## 2016-04-16 LAB — CBC WITH DIFFERENTIAL/PLATELET
BASOS: 1 %
Basophils Absolute: 0.1 10*3/uL (ref 0.0–0.2)
EOS (ABSOLUTE): 0.2 10*3/uL (ref 0.0–0.4)
Eos: 3 %
HEMOGLOBIN: 12.7 g/dL (ref 11.1–15.9)
Hematocrit: 38.9 % (ref 34.0–46.6)
IMMATURE GRANS (ABS): 0 10*3/uL (ref 0.0–0.1)
Immature Granulocytes: 0 %
LYMPHS ABS: 2.5 10*3/uL (ref 0.7–3.1)
LYMPHS: 33 %
MCH: 29.9 pg (ref 26.6–33.0)
MCHC: 32.6 g/dL (ref 31.5–35.7)
MCV: 92 fL (ref 79–97)
Monocytes Absolute: 0.6 10*3/uL (ref 0.1–0.9)
Monocytes: 8 %
NEUTROS ABS: 4.4 10*3/uL (ref 1.4–7.0)
Neutrophils: 55 %
PLATELETS: 251 10*3/uL (ref 150–379)
RBC: 4.25 x10E6/uL (ref 3.77–5.28)
RDW: 13.6 % (ref 12.3–15.4)
WBC: 7.8 10*3/uL (ref 3.4–10.8)

## 2016-04-16 LAB — BMP8+EGFR
BUN / CREAT RATIO: 17 (ref 12–28)
BUN: 17 mg/dL (ref 8–27)
CO2: 27 mmol/L (ref 18–29)
CREATININE: 1.03 mg/dL — AB (ref 0.57–1.00)
Calcium: 9.6 mg/dL (ref 8.7–10.3)
Chloride: 96 mmol/L (ref 96–106)
GFR calc non Af Amer: 53 mL/min/{1.73_m2} — ABNORMAL LOW (ref >59)
GFR, EST AFRICAN AMERICAN: 61 mL/min/{1.73_m2} (ref >59)
Glucose: 94 mg/dL (ref 65–99)
Potassium: 3.5 mmol/L (ref 3.5–5.2)
Sodium: 143 mmol/L (ref 134–144)

## 2016-04-16 LAB — LIPID PANEL
CHOLESTEROL TOTAL: 136 mg/dL (ref 100–199)
Chol/HDL Ratio: 2.8 ratio units (ref 0.0–4.4)
HDL: 49 mg/dL (ref >39)
LDL Calculated: 61 mg/dL (ref 0–99)
Triglycerides: 130 mg/dL (ref 0–149)
VLDL Cholesterol Cal: 26 mg/dL (ref 5–40)

## 2016-04-16 LAB — HEPATIC FUNCTION PANEL
ALK PHOS: 85 IU/L (ref 39–117)
ALT: 12 IU/L (ref 0–32)
AST: 21 IU/L (ref 0–40)
Albumin: 4.5 g/dL (ref 3.5–4.8)
BILIRUBIN, DIRECT: 0.18 mg/dL (ref 0.00–0.40)
Bilirubin Total: 0.8 mg/dL (ref 0.0–1.2)
Total Protein: 7.2 g/dL (ref 6.0–8.5)

## 2016-04-16 LAB — VITAMIN D 25 HYDROXY (VIT D DEFICIENCY, FRACTURES): Vit D, 25-Hydroxy: 33.9 ng/mL (ref 30.0–100.0)

## 2016-04-17 LAB — THYROID PANEL WITH TSH
FREE THYROXINE INDEX: 2.8 (ref 1.2–4.9)
T3 Uptake Ratio: 28 % (ref 24–39)
T4 TOTAL: 9.9 ug/dL (ref 4.5–12.0)
TSH: 0.621 u[IU]/mL (ref 0.450–4.500)

## 2016-04-17 LAB — SPECIMEN STATUS REPORT

## 2016-05-03 ENCOUNTER — Other Ambulatory Visit: Payer: Self-pay | Admitting: Family Medicine

## 2016-06-14 DIAGNOSIS — L570 Actinic keratosis: Secondary | ICD-10-CM | POA: Diagnosis not present

## 2016-06-14 DIAGNOSIS — D485 Neoplasm of uncertain behavior of skin: Secondary | ICD-10-CM | POA: Diagnosis not present

## 2016-06-14 DIAGNOSIS — C44722 Squamous cell carcinoma of skin of right lower limb, including hip: Secondary | ICD-10-CM | POA: Diagnosis not present

## 2016-06-14 DIAGNOSIS — Z85828 Personal history of other malignant neoplasm of skin: Secondary | ICD-10-CM | POA: Diagnosis not present

## 2016-06-24 DIAGNOSIS — C44722 Squamous cell carcinoma of skin of right lower limb, including hip: Secondary | ICD-10-CM | POA: Diagnosis not present

## 2016-07-01 ENCOUNTER — Other Ambulatory Visit: Payer: Self-pay | Admitting: *Deleted

## 2016-07-01 MED ORDER — OXAZEPAM 15 MG PO CAPS: ORAL_CAPSULE | ORAL | 1 refills | Status: DC

## 2016-07-01 NOTE — Progress Notes (Signed)
This is okay to refill and the order for this has already been signed

## 2016-07-01 NOTE — Progress Notes (Signed)
Pt requesting refill of Serax Last seen 04/15/2016 Please advise

## 2016-07-30 ENCOUNTER — Other Ambulatory Visit: Payer: Self-pay | Admitting: Cardiovascular Disease

## 2016-08-02 ENCOUNTER — Telehealth: Payer: Self-pay | Admitting: Family Medicine

## 2016-08-02 NOTE — Telephone Encounter (Signed)
Closing encounter. Taken care of by Leitha Bleak, LPN

## 2016-08-10 ENCOUNTER — Other Ambulatory Visit: Payer: Self-pay | Admitting: Family Medicine

## 2016-08-19 ENCOUNTER — Ambulatory Visit (INDEPENDENT_AMBULATORY_CARE_PROVIDER_SITE_OTHER): Payer: Commercial Managed Care - HMO | Admitting: Family Medicine

## 2016-08-19 ENCOUNTER — Encounter: Payer: Self-pay | Admitting: Family Medicine

## 2016-08-19 VITALS — BP 100/74 | HR 83 | Temp 97.6°F | Ht 61.25 in | Wt 116.0 lb

## 2016-08-19 DIAGNOSIS — M8000XD Age-related osteoporosis with current pathological fracture, unspecified site, subsequent encounter for fracture with routine healing: Secondary | ICD-10-CM

## 2016-08-19 DIAGNOSIS — R7989 Other specified abnormal findings of blood chemistry: Secondary | ICD-10-CM

## 2016-08-19 DIAGNOSIS — E559 Vitamin D deficiency, unspecified: Secondary | ICD-10-CM | POA: Diagnosis not present

## 2016-08-19 DIAGNOSIS — R946 Abnormal results of thyroid function studies: Secondary | ICD-10-CM | POA: Diagnosis not present

## 2016-08-19 DIAGNOSIS — I10 Essential (primary) hypertension: Secondary | ICD-10-CM

## 2016-08-19 DIAGNOSIS — E7800 Pure hypercholesterolemia, unspecified: Secondary | ICD-10-CM

## 2016-08-19 DIAGNOSIS — F419 Anxiety disorder, unspecified: Secondary | ICD-10-CM | POA: Diagnosis not present

## 2016-08-19 DIAGNOSIS — Z23 Encounter for immunization: Secondary | ICD-10-CM | POA: Diagnosis not present

## 2016-08-19 MED ORDER — OXAZEPAM 15 MG PO CAPS: ORAL_CAPSULE | ORAL | 3 refills | Status: DC

## 2016-08-19 NOTE — Patient Instructions (Addendum)
Medicare Annual Wellness Visit  [facility] and the medical providers at [facility] strive to bring you the best medical care.  In doing so we not only want to address your current medical conditions and concerns but also to detect new conditions early and prevent illness, disease and health-related problems.    Medicare offers a yearly Wellness Visit which allows our clinical staff to assess your need for preventative services including immunizations, lifestyle education, counseling to decrease risk of preventable diseases and screening for fall risk and other medical concerns.    This visit is provided free of charge (no copay) for all Medicare recipients. The clinical pharmacists at [facility] have begun to conduct these Wellness Visits which will also include a thorough review of all your medications.    As you primary medical provider recommend that you make an appointment for your Annual Wellness Visit if you have not done so already this year.  You may set up this appointment before you leave today or you may call back [phone] and schedule an appointment.  Please make sure when you call that you mention that you are scheduling your Annual Wellness Visit with the clinical pharmacist so that the appointment may be made for the proper length of time.    Continue current medications. Continue good therapeutic lifestyle changes which include good diet and exercise. Fall precautions discussed with patient. If an FOBT was given today- please return it to our front desk. If you are over 26 years old - you may need Prevnar 75 or the adult Pneumonia vaccine.  **Flu shots are available--- please call and schedule a FLU-CLINIC appointment**  After your visit with [place] today you will receive a survey in the mail or online from [employer] regarding your care with [place]. Please take a moment to fill this out. Your feedback is very  important to [place] as you can help [place] better understand your patient needs as well as improve your experience and satisfaction. WE CARE ABOUT YOU!!!   The patient should walk and exercise regularly The blood work is returned we will call her make sure she understands that everything is okay at that time She should keep taking her current thyroid medicine as she is doing The flu Shot that she receives today could make her arm sore If the swallowing problem gets worse, she should go back and see the gastroenterologist for further evaluation and more dilatation.

## 2016-08-19 NOTE — Progress Notes (Signed)
Subjective:    Patient ID: [name], female    DOB: 1940-12-26, 75 y.o.   MRN: [id number]  HPI Pt here for follow up and management of chronic medical problems which includes hyperlipidemia and hypertension. She is taking medications regularly.This patient has a history of skin cancer anxiety and hypothyroidism. She is also had a vertebral fracture secondary to osteoporosis. The patient denies any chest pain or shortness of breath. She is passing her water without problems. She does have trouble with swallowing and has to chew her food up thoroughly in order for her to go down. Several years ago she was dilated and she could not tell that it helped that much. The swallowing issues have not changed a lot since then. She is not seen any blood in the stool or had any black tarry bowel movements. She and her husband are getting ready to move to a new apartment has 2 bedrooms. She was having some right hip pain but this is actually better and it actually comes and goes. She has not had any falls or injuries. She is due for lab work today for flu shot and to return an FOBT. We have had some problems getting her thyroid properly regulated.    Patient Active Problem List   Diagnosis Date Noted  . PAC (premature atrial contraction) 12/30/2015  . Essential hypertension 12/18/2015  . Lower extremity edema 12/18/2015  . Palpitations 12/18/2015  . Hyperlipemia 02/14/2013  . Hypothyroid 02/14/2013  . Vitamin D deficiency 02/14/2013  . Generalized anxiety disorder 02/14/2013  . Constipation   . Esophageal stricture   . Osteoporosis with pathological fracture   . GERD (gastroesophageal reflux disease)    Outpatient Encounter Prescriptions as of 08/19/2016  Medication Sig  . atorvastatin (LIPITOR) 80 MG tablet TAKE 1 TABLET EVERY DAY  . Cholecalciferol (VITAMIN D3) 5000 UNITS TABS Take 1 tablet by mouth daily.    Marland Kitchen ENSURE PLUS (ENSURE PLUS) LIQD Take 237 mLs by mouth daily.  . hydrochlorothiazide  (HYDRODIURIL) 25 MG tablet TAKE 1 TABLET EVERY DAY  . levothyroxine (SYNTHROID, LEVOTHROID) 100 MCG tablet TAKE 1 TABLET (100 MCG TOTAL) BY MOUTH DAILY BEFORE BREAKFAST EXCEPT TAKE 1/2 TABLET ON SUNDAY  . Multiple Vitamin (MULTIVITAMIN WITH MINERALS) TABS tablet Take 1 tablet by mouth daily.  Marland Kitchen oxazepam (SERAX) 15 MG capsule TAKE ONE (1) CAPSULE THREE (3) TIMES EACH DAY (Patient taking differently: Take 15 mg by mouth 3 (three) times daily as needed. TAKE ONE (1) CAPSULE THREE (3) TIMES EACH DAY)  . potassium chloride 20 MEQ/15ML (10%) solution TAKE 1&1/2 TEASPOONFULS DAILY  . sucralfate (CARAFATE) 1 g tablet Take 1 tablet (1 g total) by mouth 2 (two) times daily.  . [DISCONTINUED] atorvastatin (LIPITOR) 80 MG tablet TAKE 1 TABLET EVERY DAY   No facility-administered encounter medications on file as of 08/19/2016.       Review of Systems  Constitutional: Negative.   HENT: Negative.   Eyes: Negative.   Respiratory: Negative.   Cardiovascular: Negative.   Gastrointestinal: Negative.   Endocrine: Negative.   Genitourinary: Negative.   Musculoskeletal: Positive for arthralgias (right hip pain).  Skin: Negative.   Allergic/Immunologic: Negative.   Neurological: Negative.   Hematological: Negative.   Psychiatric/Behavioral: Negative.        Objective:   Physical Exam  Constitutional: She is oriented to person, place, and time. She appears well-developed and well-nourished. No distress.  Small framed and alert  HENT:  Head: Normocephalic and atraumatic.  Right Ear:  External ear normal.  Left Ear: External ear normal.  Nose: Nose normal.  Mouth/Throat: Oropharynx is clear and moist. No oropharyngeal exudate.  Eyes: Conjunctivae and EOM are normal. Pupils are equal, round, and reactive to light. Right eye exhibits no discharge. Left eye exhibits no discharge. No scleral icterus.  Neck: Normal range of motion. Neck supple. No thyromegaly present.  No thyromegaly or bruits or anterior  cervical adenopathy  Cardiovascular: Normal rate, regular rhythm, normal heart sounds and intact distal pulses.   No murmur heard. Heart is regular at 72/m  Pulmonary/Chest: Effort normal and breath sounds normal. No respiratory distress. She has no wheezes. She has no rales.  Clear anteriorly and posteriorly  Abdominal: Soft. Bowel sounds are normal. She exhibits no mass. There is no tenderness. There is no rebound and no guarding.  No abdominal tenderness or organ enlargement masses or bruits  Musculoskeletal: Normal range of motion. She exhibits no edema.  Lymphadenopathy:    She has no cervical adenopathy.  Neurological: She is alert and oriented to person, place, and time. She has normal reflexes. No cranial nerve deficit.  Skin: Skin is warm and dry. No rash noted.  Psychiatric: She has a normal mood and affect. Her behavior is normal. Judgment and thought content normal.  Nursing note and vitals reviewed.   BP 100/74 (BP Location: Left Arm)   Pulse 83   Temp 97.6 F (36.4 C) (Oral)   Ht 5' 1.25" (1.556 m)   Wt 116 lb (52.6 kg)   BMI 21.74 kg/m        Assessment & Plan:  1. Vitamin D deficiency -Continue current treatment pending results of lab work - CBC with Differential/Platelet - VITAMIN D 25 Hydroxy (Vit-D Deficiency, Fractures)  2. Pure hypercholesterolemia -Continue aggressive therapeutic lifestyle changes and atorvastatin pending results of lab work - CBC with Differential/Platelet - Lipid panel  3. Essential hypertension -The blood pressure is good today and she should continue with current treatment - BMP8+EGFR - CBC with Differential/Platelet - Hepatic function panel  4. Low TSH level -Continue with current treatment pending results of lab work - CBC with Differential/Platelet - Thyroid Panel With TSH  5. Anxiety disorder, unspecified type -Continue with Serax as needed  6. Age-related osteoporosis with current pathological fracture with routine  healing, subsequent encounter -Continue to be careful and do not put yourself at risk for falling  Meds ordered this encounter  Medications  . oxazepam (SERAX) 15 MG capsule    Sig: TAKE ONE (1) CAPSULE THREE (3) TIMES EACH DAY    Dispense:  90 capsule    Refill:  3   Patient Instructions                       Medicare Annual Wellness Visit  [facility] and the medical providers at [facility] strive to bring you the best medical care.  In doing so we not only want to address your current medical conditions and concerns but also to detect new conditions early and prevent illness, disease and health-related problems.    Medicare offers a yearly Wellness Visit which allows our clinical staff to assess your need for preventative services including immunizations, lifestyle education, counseling to decrease risk of preventable diseases and screening for fall risk and other medical concerns.    This visit is provided free of charge (no copay) for all Medicare recipients. The clinical pharmacists at [facility] have begun to conduct these  Wellness Visits which will also include a thorough review of all your medications.    As you primary medical provider recommend that you make an appointment for your Annual Wellness Visit if you have not done so already this year.  You may set up this appointment before you leave today or you may call back ([phone]) and schedule an appointment.  Please make sure when you call that you mention that you are scheduling your Annual Wellness Visit with the clinical pharmacist so that the appointment may be made for the proper length of time.    Continue current medications. Continue good therapeutic lifestyle changes which include good diet and exercise. Fall precautions discussed with patient. If an FOBT was given today- please return it to our front desk. If you are over 49 years old - you may need Prevnar 25 or the  adult Pneumonia vaccine.  **Flu shots are available--- please call and schedule a FLU-CLINIC appointment**  After your visit with [place] today you will receive a survey in the mail or online from [employer] regarding your care with [place]. Please take a moment to fill this out. Your feedback is very important to [place] as you can help [place] better understand your patient needs as well as improve your experience and satisfaction. WE CARE ABOUT YOU!!!   The patient should walk and exercise regularly The blood work is returned we will call her make sure she understands that everything is okay at that time She should keep taking her current thyroid medicine as she is doing The flu Shot that she receives today could make her arm sore If the swallowing problem gets worse, she should go back and see the gastroenterologist for further evaluation and more dilatation.   Arrie Senate MD

## 2016-08-20 ENCOUNTER — Telehealth: Payer: Self-pay | Admitting: Family Medicine

## 2016-08-20 LAB — CBC WITH DIFFERENTIAL/PLATELET
BASOS: 1 %
Basophils Absolute: 0.1 10*3/uL (ref 0.0–0.2)
EOS (ABSOLUTE): 0.3 10*3/uL (ref 0.0–0.4)
Eos: 3 %
Hematocrit: 36.3 % (ref 34.0–46.6)
Hemoglobin: 12.1 g/dL (ref 11.1–15.9)
Immature Grans (Abs): 0 10*3/uL (ref 0.0–0.1)
Immature Granulocytes: 0 %
Lymphocytes Absolute: 2.4 10*3/uL (ref 0.7–3.1)
Lymphs: 33 %
MCH: 29.7 pg (ref 26.6–33.0)
MCHC: 33.3 g/dL (ref 31.5–35.7)
MCV: 89 fL (ref 79–97)
MONOS ABS: 0.7 10*3/uL (ref 0.1–0.9)
Monocytes: 9 %
NEUTROS ABS: 4.1 10*3/uL (ref 1.4–7.0)
Neutrophils: 54 %
PLATELETS: 251 10*3/uL (ref 150–379)
RBC: 4.08 x10E6/uL (ref 3.77–5.28)
RDW: 13.8 % (ref 12.3–15.4)
WBC: 7.5 10*3/uL (ref 3.4–10.8)

## 2016-08-20 LAB — BMP8+EGFR
BUN/Creatinine Ratio: 17 (ref 12–28)
BUN: 17 mg/dL (ref 8–27)
CHLORIDE: 99 mmol/L (ref 96–106)
CO2: 25 mmol/L (ref 18–29)
Calcium: 9.2 mg/dL (ref 8.7–10.3)
Creatinine, Ser: 1.01 mg/dL — ABNORMAL HIGH (ref 0.57–1.00)
GFR, EST AFRICAN AMERICAN: 62 mL/min/{1.73_m2} (ref >59)
GFR, EST NON AFRICAN AMERICAN: 54 mL/min/{1.73_m2} — AB (ref >59)
Glucose: 89 mg/dL (ref 65–99)
POTASSIUM: 3.9 mmol/L (ref 3.5–5.2)
SODIUM: 142 mmol/L (ref 134–144)

## 2016-08-20 LAB — THYROID PANEL WITH TSH
FREE THYROXINE INDEX: 2.8 (ref 1.2–4.9)
T3 UPTAKE RATIO: 28 % (ref 24–39)
T4, Total: 10 ug/dL (ref 4.5–12.0)
TSH: 1.15 u[IU]/mL (ref 0.450–4.500)

## 2016-08-20 LAB — VITAMIN D 25 HYDROXY (VIT D DEFICIENCY, FRACTURES): VIT D 25 HYDROXY: 31.5 ng/mL (ref 30.0–100.0)

## 2016-08-20 LAB — HEPATIC FUNCTION PANEL
ALT: 13 IU/L (ref 0–32)
AST: 22 IU/L (ref 0–40)
Albumin: 4.3 g/dL (ref 3.5–4.8)
Alkaline Phosphatase: 82 IU/L (ref 39–117)
BILIRUBIN, DIRECT: 0.17 mg/dL (ref 0.00–0.40)
Bilirubin Total: 0.6 mg/dL (ref 0.0–1.2)
Total Protein: 6.9 g/dL (ref 6.0–8.5)

## 2016-08-20 LAB — LIPID PANEL
CHOLESTEROL TOTAL: 131 mg/dL (ref 100–199)
Chol/HDL Ratio: 2.8 ratio units (ref 0.0–4.4)
HDL: 47 mg/dL (ref >39)
LDL Calculated: 55 mg/dL (ref 0–99)
TRIGLYCERIDES: 143 mg/dL (ref 0–149)
VLDL Cholesterol Cal: 29 mg/dL (ref 5–40)

## 2016-08-20 NOTE — Telephone Encounter (Signed)
Patient aware of results.

## 2016-09-20 ENCOUNTER — Telehealth: Payer: Self-pay | Admitting: Family Medicine

## 2016-09-20 NOTE — Telephone Encounter (Signed)
DENIED 

## 2016-11-05 ENCOUNTER — Encounter (INDEPENDENT_AMBULATORY_CARE_PROVIDER_SITE_OTHER): Payer: Self-pay

## 2016-11-05 ENCOUNTER — Ambulatory Visit (INDEPENDENT_AMBULATORY_CARE_PROVIDER_SITE_OTHER): Payer: Medicare HMO | Admitting: Family Medicine

## 2016-11-05 ENCOUNTER — Encounter: Payer: Self-pay | Admitting: Family Medicine

## 2016-11-05 VITALS — BP 176/89 | HR 109 | Temp 96.8°F | Ht 61.25 in | Wt 116.8 lb

## 2016-11-05 DIAGNOSIS — R0781 Pleurodynia: Secondary | ICD-10-CM

## 2016-11-05 MED ORDER — HYDROCODONE-HOMATROPINE 5-1.5 MG/5ML PO SYRP: 2.5000 mL | ORAL_SOLUTION | Freq: Four times a day (QID) | ORAL | 0 refills | Status: DC | PRN

## 2016-11-05 NOTE — Patient Instructions (Signed)
Great to see you!  Come back Monday for the x ray  Do not drive after taking the pain medicine.   Please come back if you have any fevers or shortness of breath

## 2016-11-05 NOTE — Progress Notes (Addendum)
   HPI  Patient presents today here with rib pain.  Patient's lines that 4 days ago she had a short coughing fit and had severe onset of bilateral posterior rib pain after that.  Tolerating food and fluids normally, she states that she does not have a persistent cough. She does have a "tickle in her throat" at times. She states she's tolerating food and fluids normally and her bowels and bladder are operating room the like usual.  She does complain of slightly worsening incontinence lately and states that if this does not get better she will leave a urine sample when she returns for chest x-ray on Monday.  PMH: Smoking status noted ROS: Per HPI  Objective: BP (!) 176/89   Pulse (!) 109   Temp (!) 96.8 F (36 C) (Oral)   Ht 5' 1.25" (1.556 m)   Wt 116 lb 12.8 oz (53 kg)   BMI 21.89 kg/m  Gen: NAD, alert, cooperative with exam HEENT: NCAT CV: RRR, good S1/S2, no murmur Resp: CTABL, no wheezes, non-labored- breath sounds isn't throughout Abd: SNTND, BS present, no guarding or organomegaly Ext: No edema, warm Neuro: Alert and oriented, No gross deficits  Assessment and plan:  # Rib pain Bilateral rib pain after a coughing fit. Patient with history of pathologic fractures with osteoporosis. I think it's very likely that she has some small rib fractures. X-rays not available today in our clinic, patient will return on Monday for x-ray. Given the small amount of Hycodan for pain relief, also reviewed Tylenol dosing that is appropriate. Discussed reasons for return or seek emergency medical care including fever, shortness of breath, or other concerns.  Blood pressure elevated today, I believe this is due to uncontrolled pain. Blood pressure is generally very well controlled for her.     Orders Placed This Encounter  Procedures  . DG Chest 2 View    Standing Status:   Future    Standing Expiration Date:   01/03/2018    Order Specific Question:   Reason for Exam (SYMPTOM  OR  DIAGNOSIS REQUIRED)    Answer:   rib pain after cough, eval for broken ribs, BL posterior lower ribs    Order Specific Question:   Preferred imaging location?    Answer:   Internal    Meds ordered this encounter  Medications  . HYDROcodone-homatropine (HYCODAN) 5-1.5 MG/5ML syrup    Sig: Take 2.5-5 mLs by mouth every 6 (six) hours as needed for cough (or pain).    Dispense:  120 mL    Refill:  0    Laroy Apple, MD [facility] 11/05/2016, 2:56 PM

## 2016-11-08 ENCOUNTER — Ambulatory Visit (INDEPENDENT_AMBULATORY_CARE_PROVIDER_SITE_OTHER): Payer: Medicare HMO

## 2016-11-08 ENCOUNTER — Other Ambulatory Visit: Payer: Medicare HMO

## 2016-11-08 DIAGNOSIS — R0781 Pleurodynia: Secondary | ICD-10-CM

## 2016-11-08 DIAGNOSIS — Z1212 Encounter for screening for malignant neoplasm of rectum: Secondary | ICD-10-CM

## 2016-11-09 LAB — FECAL OCCULT BLOOD, IMMUNOCHEMICAL: Fecal Occult Bld: NEGATIVE

## 2016-11-10 ENCOUNTER — Ambulatory Visit (INDEPENDENT_AMBULATORY_CARE_PROVIDER_SITE_OTHER): Payer: Medicare HMO | Admitting: Family Medicine

## 2016-11-10 ENCOUNTER — Encounter: Payer: Self-pay | Admitting: Family Medicine

## 2016-11-10 VITALS — BP 119/82 | HR 93 | Temp 96.8°F | Ht 61.65 in | Wt 116.0 lb

## 2016-11-10 DIAGNOSIS — S22000G Wedge compression fracture of unspecified thoracic vertebra, subsequent encounter for fracture with delayed healing: Secondary | ICD-10-CM | POA: Diagnosis not present

## 2016-11-10 DIAGNOSIS — R1013 Epigastric pain: Secondary | ICD-10-CM | POA: Diagnosis not present

## 2016-11-10 MED ORDER — ACETAMINOPHEN-CODEINE 120-12 MG/5ML PO SOLN: 5.0000 mL | Freq: Every evening | ORAL | 0 refills | Status: DC | PRN

## 2016-11-10 NOTE — Addendum Note (Signed)
Addended by: Zannie Cove on: 11/10/2016 04:05 PM   Modules accepted: Orders

## 2016-11-10 NOTE — Progress Notes (Signed)
Subjective:    Patient ID: [name], female    DOB: Dec 27, 1940, 76 y.o.   MRN: [id number]  HPI Patient here today for follow up from cough and recent vertebral compression fracture. Patient has a history of multiple compression fractures from osteoporosis. She has been on Forteo in the past but is currently not taking anything. She has trouble swallowing pills. She still hurts when she takes a deep breath. The pain radiates around to her upper abdomen from her back. Using models I explained what happens with compression fractures and she seems to understand this. The radiologist recommended that we get an MRI of the thoracic spine and maybe we can arrange to have a vertebral plasty done to help her with pain control.   Patient Active Problem List   Diagnosis Date Noted  . PAC (premature atrial contraction) 12/30/2015  . Essential hypertension 12/18/2015  . Lower extremity edema 12/18/2015  . Palpitations 12/18/2015  . Hyperlipemia 02/14/2013  . Hypothyroid 02/14/2013  . Vitamin D deficiency 02/14/2013  . Generalized anxiety disorder 02/14/2013  . Constipation   . Esophageal stricture   . Osteoporosis with pathological fracture   . GERD (gastroesophageal reflux disease)    Outpatient Encounter Prescriptions as of 11/10/2016  Medication Sig  . atorvastatin (LIPITOR) 80 MG tablet TAKE 1 TABLET EVERY DAY  . Cholecalciferol (VITAMIN D3) 5000 UNITS TABS Take 1 tablet by mouth daily.    Marland Kitchen ENSURE PLUS (ENSURE PLUS) LIQD Take 237 mLs by mouth daily.  . hydrochlorothiazide (HYDRODIURIL) 25 MG tablet TAKE 1 TABLET EVERY DAY  . levothyroxine (SYNTHROID, LEVOTHROID) 100 MCG tablet TAKE 1 TABLET (100 MCG TOTAL) BY MOUTH DAILY BEFORE BREAKFAST EXCEPT TAKE 1/2 TABLET ON SUNDAY  . Multiple Vitamin (MULTIVITAMIN WITH MINERALS) TABS tablet Take 1 tablet by mouth daily.  Marland Kitchen oxazepam (SERAX) 15 MG capsule TAKE ONE (1) CAPSULE THREE (3) TIMES EACH DAY  . potassium chloride 20 MEQ/15ML (10%) solution  TAKE 1&1/2 TEASPOONFULS DAILY  . sucralfate (CARAFATE) 1 g tablet Take 1 tablet (1 g total) by mouth 2 (two) times daily.  Marland Kitchen HYDROcodone-homatropine (HYCODAN) 5-1.5 MG/5ML syrup Take 2.5-5 mLs by mouth every 6 (six) hours as needed for cough (or pain). (Patient not taking: Reported on 11/10/2016)   No facility-administered encounter medications on file as of 11/10/2016.       Review of Systems  Constitutional: Negative.   HENT: Negative.   Eyes: Negative.   Respiratory: Negative.   Cardiovascular: Negative.   Gastrointestinal: Negative.   Endocrine: Negative.   Genitourinary: Negative.   Musculoskeletal: Positive for back pain (mid - to low back pain).  Skin: Negative.   Allergic/Immunologic: Negative.   Neurological: Negative.   Hematological: Negative.   Psychiatric/Behavioral: Negative.        Objective:   Physical Exam  Constitutional: She is oriented to person, place, and time. She appears distressed.  Patient is kyphotic and is in obvious pain even with deep breathing.  HENT:  Head: Normocephalic.  Eyes: Conjunctivae and EOM are normal. Pupils are equal, round, and reactive to light. Right eye exhibits no discharge. Left eye exhibits no discharge. No scleral icterus.  Neck: Normal range of motion. Neck supple.  Cardiovascular: Normal rate, regular rhythm and normal heart sounds.   No murmur heard. Pulmonary/Chest: Effort normal. No respiratory distress. She has no wheezes. She has no rales. She exhibits tenderness.  Diminished breath sounds bilaterally  Musculoskeletal: She exhibits no edema.  Range of motion and even deep breathing are  limited due to the pain that she is having from the thoracic spine  Neurological: She is alert and oriented to person, place, and time.  Skin: Skin is warm and dry. No rash noted.  Psychiatric: She has a normal mood and affect. Her behavior is normal. Judgment and thought content normal.  Nursing note and vitals reviewed.  BP 119/82  (BP Location: Left Arm)   Pulse 93   Temp (!) 96.8 F (36 C) (Oral)   Ht 5' 1.65" (1.566 m)   Wt 116 lb (52.6 kg)   BMI 21.46 kg/m         Assessment & Plan:  1. Closed wedge fracture of thoracic vertebra with delayed healing, unspecified thoracic vertebral level, subsequent encounter -The patient continues to have pain especially with deep breathing. The pain radiates around to her abdomen from her back. -As recommended by the radiologist we will get thoracic spine MRI and then make further recommendations for treating her severe pain.  2. Epigastric pain -The epigastric pain is secondary to the vertebral fracture in the back and hurts mostly with deep breathing and any kind of movement. The patient is encouraged to take deep breathing exercises to keep from getting pneumonia.   Patient Instructions  We will arrange to get an MRI of the thoracic spine based on what appears to be a new compression fracture in the back The patient will be given a prescription of a liquid medication for pain control until we are able to get the MRI of the thoracic spine   Arrie Senate MD

## 2016-11-10 NOTE — Patient Instructions (Addendum)
We will arrange to get an MRI of the thoracic spine based on what appears to be a new compression fracture in the back The patient will be given a prescription of a liquid medication for pain control until we are able to get the MRI of the thoracic spine We'll also schedule visit with clinical pharmacy for further management of her osteoporosis

## 2016-11-19 ENCOUNTER — Ambulatory Visit (HOSPITAL_COMMUNITY)
Admission: RE | Admit: 2016-11-19 | Discharge: 2016-11-19 | Disposition: A | Discharge status: Home / Self Care | Payer: Medicare HMO | Source: Ambulatory Visit | Attending: Family Medicine | Admitting: Family Medicine

## 2016-11-19 DIAGNOSIS — R1013 Epigastric pain: Secondary | ICD-10-CM | POA: Diagnosis not present

## 2016-11-19 DIAGNOSIS — M4856XG Collapsed vertebra, not elsewhere classified, lumbar region, subsequent encounter for fracture with delayed healing: Secondary | ICD-10-CM | POA: Insufficient documentation

## 2016-11-19 DIAGNOSIS — S22069A Unspecified fracture of T7-T8 vertebra, initial encounter for closed fracture: Secondary | ICD-10-CM | POA: Diagnosis not present

## 2016-11-19 DIAGNOSIS — S22000G Wedge compression fracture of unspecified thoracic vertebra, subsequent encounter for fracture with delayed healing: Secondary | ICD-10-CM | POA: Diagnosis present

## 2016-11-19 DIAGNOSIS — R29890 Loss of height: Secondary | ICD-10-CM | POA: Diagnosis not present

## 2016-11-19 DIAGNOSIS — M4854XG Collapsed vertebra, not elsewhere classified, thoracic region, subsequent encounter for fracture with delayed healing: Secondary | ICD-10-CM | POA: Insufficient documentation

## 2016-11-20 ENCOUNTER — Other Ambulatory Visit: Payer: Self-pay | Admitting: Family Medicine

## 2016-11-23 ENCOUNTER — Other Ambulatory Visit: Payer: Self-pay | Admitting: *Deleted

## 2016-11-23 DIAGNOSIS — S22000A Wedge compression fracture of unspecified thoracic vertebra, initial encounter for closed fracture: Secondary | ICD-10-CM

## 2016-11-30 ENCOUNTER — Telehealth (HOSPITAL_COMMUNITY): Payer: Self-pay

## 2016-11-30 ENCOUNTER — Other Ambulatory Visit (HOSPITAL_COMMUNITY): Payer: Self-pay | Admitting: Interventional Radiology

## 2016-11-30 DIAGNOSIS — IMO0001 Reserved for inherently not codable concepts without codable children: Secondary | ICD-10-CM

## 2016-11-30 DIAGNOSIS — M4850XA Collapsed vertebra, not elsewhere classified, site unspecified, initial encounter for fracture: Principal | ICD-10-CM

## 2016-11-30 NOTE — Telephone Encounter (Signed)
Called to schedule consult for compression fx. Pt stated that her back is feeling better and that she doesn't feel that she needs treatment. She would like to give it a couple more weeks before she decides anything. AW

## 2016-12-01 ENCOUNTER — Other Ambulatory Visit: Payer: Self-pay | Admitting: Family Medicine

## 2016-12-01 NOTE — Telephone Encounter (Signed)
Go ahead and call in one months worth and have her follow-up with Dr. Laurance Flatten in the future.

## 2016-12-01 NOTE — Telephone Encounter (Signed)
[facility] pt - seen last 11/10/16. Not sure if this one is phone in - send to pool if so.

## 2016-12-03 ENCOUNTER — Other Ambulatory Visit: Payer: Self-pay | Admitting: Family Medicine

## 2016-12-10 ENCOUNTER — Ambulatory Visit (HOSPITAL_COMMUNITY)
Admission: RE | Admit: 2016-12-10 | Discharge: 2016-12-10 | Disposition: A | Discharge status: Home / Self Care | Payer: Medicare HMO | Source: Ambulatory Visit | Attending: Interventional Radiology | Admitting: Interventional Radiology

## 2016-12-10 ENCOUNTER — Encounter (HOSPITAL_COMMUNITY): Payer: Self-pay | Admitting: Radiology

## 2016-12-10 DIAGNOSIS — IMO0001 Reserved for inherently not codable concepts without codable children: Secondary | ICD-10-CM

## 2016-12-10 DIAGNOSIS — M4850XA Collapsed vertebra, not elsewhere classified, site unspecified, initial encounter for fracture: Principal | ICD-10-CM

## 2016-12-10 DIAGNOSIS — M4854XA Collapsed vertebra, not elsewhere classified, thoracic region, initial encounter for fracture: Secondary | ICD-10-CM | POA: Diagnosis not present

## 2016-12-10 DIAGNOSIS — M546 Pain in thoracic spine: Secondary | ICD-10-CM | POA: Diagnosis not present

## 2016-12-10 HISTORY — PX: IR GENERIC HISTORICAL: IMG1180011

## 2016-12-20 ENCOUNTER — Other Ambulatory Visit (HOSPITAL_COMMUNITY): Payer: Self-pay | Admitting: Interventional Radiology

## 2016-12-20 DIAGNOSIS — IMO0001 Reserved for inherently not codable concepts without codable children: Secondary | ICD-10-CM

## 2016-12-20 DIAGNOSIS — M4850XS Collapsed vertebra, not elsewhere classified, site unspecified, sequela of fracture: Principal | ICD-10-CM

## 2016-12-22 ENCOUNTER — Other Ambulatory Visit: Payer: Self-pay | Admitting: Radiology

## 2016-12-22 ENCOUNTER — Other Ambulatory Visit: Payer: Self-pay | Admitting: General Surgery

## 2016-12-23 ENCOUNTER — Encounter (HOSPITAL_COMMUNITY): Payer: Self-pay | Admitting: Interventional Radiology

## 2016-12-23 ENCOUNTER — Ambulatory Visit (HOSPITAL_COMMUNITY)
Admission: RE | Admit: 2016-12-23 | Discharge: 2016-12-23 | Disposition: A | Discharge status: Home / Self Care | Payer: Medicare HMO | Source: Ambulatory Visit | Attending: Interventional Radiology | Admitting: Interventional Radiology

## 2016-12-23 DIAGNOSIS — S22069A Unspecified fracture of T7-T8 vertebra, initial encounter for closed fracture: Secondary | ICD-10-CM | POA: Diagnosis not present

## 2016-12-23 DIAGNOSIS — F419 Anxiety disorder, unspecified: Secondary | ICD-10-CM | POA: Insufficient documentation

## 2016-12-23 DIAGNOSIS — E039 Hypothyroidism, unspecified: Secondary | ICD-10-CM | POA: Insufficient documentation

## 2016-12-23 DIAGNOSIS — K449 Diaphragmatic hernia without obstruction or gangrene: Secondary | ICD-10-CM | POA: Insufficient documentation

## 2016-12-23 DIAGNOSIS — IMO0001 Reserved for inherently not codable concepts without codable children: Secondary | ICD-10-CM

## 2016-12-23 DIAGNOSIS — I10 Essential (primary) hypertension: Secondary | ICD-10-CM | POA: Insufficient documentation

## 2016-12-23 DIAGNOSIS — M546 Pain in thoracic spine: Secondary | ICD-10-CM | POA: Diagnosis not present

## 2016-12-23 DIAGNOSIS — Z8249 Family history of ischemic heart disease and other diseases of the circulatory system: Secondary | ICD-10-CM | POA: Insufficient documentation

## 2016-12-23 DIAGNOSIS — M4854XA Collapsed vertebra, not elsewhere classified, thoracic region, initial encounter for fracture: Secondary | ICD-10-CM | POA: Insufficient documentation

## 2016-12-23 DIAGNOSIS — K219 Gastro-esophageal reflux disease without esophagitis: Secondary | ICD-10-CM | POA: Insufficient documentation

## 2016-12-23 DIAGNOSIS — E785 Hyperlipidemia, unspecified: Secondary | ICD-10-CM | POA: Insufficient documentation

## 2016-12-23 DIAGNOSIS — Z87891 Personal history of nicotine dependence: Secondary | ICD-10-CM | POA: Insufficient documentation

## 2016-12-23 DIAGNOSIS — M4850XS Collapsed vertebra, not elsewhere classified, site unspecified, sequela of fracture: Secondary | ICD-10-CM

## 2016-12-23 HISTORY — PX: IR GENERIC HISTORICAL: IMG1180011

## 2016-12-23 LAB — BASIC METABOLIC PANEL
ANION GAP: 7 (ref 5–15)
BUN: 14 mg/dL (ref 6–20)
CO2: 27 mmol/L (ref 22–32)
Calcium: 9.1 mg/dL (ref 8.9–10.3)
Chloride: 105 mmol/L (ref 101–111)
Creatinine, Ser: 1.07 mg/dL — ABNORMAL HIGH (ref 0.44–1.00)
GFR calc Af Amer: 57 mL/min — ABNORMAL LOW (ref >60)
GFR calc non Af Amer: 49 mL/min — ABNORMAL LOW (ref >60)
GLUCOSE: 105 mg/dL — AB (ref 65–99)
POTASSIUM: 3.6 mmol/L (ref 3.5–5.1)
Sodium: 139 mmol/L (ref 135–145)

## 2016-12-23 LAB — APTT: aPTT: 30 seconds (ref 24–36)

## 2016-12-23 LAB — CBC
HEMATOCRIT: 39.4 % (ref 36.0–46.0)
HEMOGLOBIN: 12.7 g/dL (ref 12.0–15.0)
MCH: 29.7 pg (ref 26.0–34.0)
MCHC: 32.2 g/dL (ref 30.0–36.0)
MCV: 92.1 fL (ref 78.0–100.0)
Platelets: 229 10*3/uL (ref 150–400)
RBC: 4.28 MIL/uL (ref 3.87–5.11)
RDW: 13.4 % (ref 11.5–15.5)
WBC: 6.6 10*3/uL (ref 4.0–10.5)

## 2016-12-23 LAB — PROTIME-INR
INR: 1.02
Prothrombin Time: 13.4 seconds (ref 11.4–15.2)

## 2016-12-23 MED ORDER — FENTANYL CITRATE (PF) 100 MCG/2ML IJ SOLN
INTRAMUSCULAR | Status: AC
  Filled 2016-12-23: qty 2

## 2016-12-23 MED ORDER — IOPAMIDOL (ISOVUE-300) INJECTION 61%
INTRAVENOUS | Status: AC
  Administered 2016-12-23: 50 mL
  Filled 2016-12-23: qty 50

## 2016-12-23 MED ORDER — MIDAZOLAM HCL 2 MG/2ML IJ SOLN
INTRAMUSCULAR | Status: AC
Start: 2016-12-23 — End: 2016-12-23
  Filled 2016-12-23: qty 6

## 2016-12-23 MED ORDER — FENTANYL CITRATE (PF) 100 MCG/2ML IJ SOLN
INTRAMUSCULAR | Status: AC | PRN
  Administered 2016-12-23 (×2): 25 ug via INTRAVENOUS

## 2016-12-23 MED ORDER — FENTANYL CITRATE (PF) 100 MCG/2ML IJ SOLN
INTRAMUSCULAR | Status: AC
  Filled 2016-12-23: qty 4

## 2016-12-23 MED ORDER — CEFAZOLIN SODIUM-DEXTROSE 2-4 GM/100ML-% IV SOLN
2.0000 g | INTRAVENOUS | Status: AC
  Administered 2016-12-23: 2 g via INTRAVENOUS

## 2016-12-23 MED ORDER — SODIUM CHLORIDE 0.9 % IV SOLN
INTRAVENOUS | Status: DC
  Administered 2016-12-23: 08:00:00 via INTRAVENOUS

## 2016-12-23 MED ORDER — CEFAZOLIN SODIUM-DEXTROSE 2-4 GM/100ML-% IV SOLN
INTRAVENOUS | Status: AC
  Administered 2016-12-23: 2 g via INTRAVENOUS
  Filled 2016-12-23: qty 100

## 2016-12-23 MED ORDER — TOBRAMYCIN SULFATE 1.2 G IJ SOLR
INTRAMUSCULAR | Status: AC
  Filled 2016-12-23: qty 1.2

## 2016-12-23 MED ORDER — BUPIVACAINE HCL 0.25 % IJ SOLN
INTRAMUSCULAR | Status: AC | PRN
  Administered 2016-12-23: 15 mL

## 2016-12-23 MED ORDER — SODIUM CHLORIDE 0.9 % IV SOLN: INTRAVENOUS | Status: AC

## 2016-12-23 MED ORDER — BUPIVACAINE HCL (PF) 0.25 % IJ SOLN
INTRAMUSCULAR | Status: AC
  Filled 2016-12-23: qty 30

## 2016-12-23 MED ORDER — HYDROMORPHONE HCL 1 MG/ML IJ SOLN
INTRAMUSCULAR | Status: AC
  Filled 2016-12-23: qty 1

## 2016-12-23 MED ORDER — MIDAZOLAM HCL 2 MG/2ML IJ SOLN
INTRAMUSCULAR | Status: AC | PRN
  Administered 2016-12-23 (×2): 1 mg via INTRAVENOUS

## 2016-12-23 NOTE — Sedation Documentation (Signed)
Patient is resting comfortably. 

## 2016-12-23 NOTE — H&P (Signed)
Chief Complaint: Patient was seen in consultation today for kyphoplasty of T8 fracture at the request of [place]  Referring Physician(s): Deveshwar,Sanjeev  Supervising Physician: Dr. Estanislado Pandy  Patient Status: [facility] - Out-pt  History of Present Illness: [name] is a 76 y.o. female with symptomatic T8 compression fracture. He was seen by Dr. Estanislado Pandy in consult and is now scheduled for KP procedure. She feels well, no recent illness, fevers, chills. No CP, SOB, abd pain, diarrhea, dysuria. Has been NPO this am. Husband at bedside  Past Medical History:  Diagnosis Date  . Anxiety states   . Breast cyst   . Cancer ([place])    skin  . Cataract   . Constipation   . Esophageal stricture   . Essential hypertension 12/18/2015  . Fracture, rib   . GERD (gastroesophageal reflux disease)   . Hiatal hernia   . Lower extremity edema 12/18/2015  . Osteoporosis   . Other and unspecified hyperlipidemia   . PAC (premature atrial contraction) 12/30/2015  . Palpitations 12/18/2015  . Unspecified hypothyroidism   . Vertebral fracture, osteoporotic [facility])     Past Surgical History:  Procedure Laterality Date  . BREAST BIOPSY    . CATARACT EXTRACTION    . EYE SURGERY    . HAND SURGERY    . IR GENERIC HISTORICAL  12/10/2016   IR RADIOLOGIST EVAL & MGMT 12/10/2016 [facility]-INTERV RAD  . PARTIAL HYSTERECTOMY    . THYROID LOBECTOMY     right    Allergies: Bisphosphonates; Ciprofloxacin; Codeine; and Sulfa antibiotics  Medications: Prior to Admission medications   Medication Sig Start Date End Date Taking? Authorizing Provider  acetaminophen (TYLENOL) 500 MG tablet Take 500 mg by mouth every 8 (eight) hours as needed for mild pain or moderate pain.   Yes Historical Provider, MD  atorvastatin (LIPITOR) 80 MG tablet Take 40 mg by mouth every evening.   Yes Historical Provider, MD  Cholecalciferol (VITAMIN D3) 5000 UNITS TABS Take 1 tablet by mouth daily.     Yes Historical  Provider, MD  levothyroxine (SYNTHROID, LEVOTHROID) 100 MCG tablet Take 50 mcg by mouth See admin instructions. Takes Mon through Fri. DOES NOT TAKE ON SAT and SUN   Yes Historical Provider, MD  Multiple Vitamin (MULTIVITAMIN WITH MINERALS) TABS tablet Take 1 tablet by mouth daily.   Yes Historical Provider, MD  oxazepam (SERAX) 15 MG capsule TAKE ONE (1) CAPSULE THREE (3) TIMES EACH DAY 12/01/16  Yes Fransisca Kaufmann Dettinger, MD  potassium chloride SA (K-DUR,KLOR-CON) 20 MEQ tablet Take 20 mEq by mouth every other day.   Yes Historical Provider, MD  sucralfate (CARAFATE) 1 g tablet Take 1 tablet (1 g total) by mouth 2 (two) times daily. Patient taking differently: Take 1 g by mouth 2 (two) times daily as needed (Takes if having stomach pain).  04/15/16  Yes Chipper Herb, MD  hydrochlorothiazide (HYDRODIURIL) 25 MG tablet Take 12.5 mg by mouth daily.    Historical Provider, MD     Family History  Problem Relation Age of Onset  . Goiter Father   . Alcohol abuse Father   . Heart disease Sister   . Cancer Sister     breast and skin  . Hernia Brother   . Cancer Brother     throat  . Cancer Brother   . Heart disease Brother   . Early death Brother     auto accident    Social History   Social History  .  Marital status: Married    Spouse name: N/A  . Number of children: N/A  . Years of education: N/A   Social History Main Topics  . Smoking status: Former Research scientist (life sciences)  . Smokeless tobacco: Never Used     Comment: Non-smoker  . Alcohol use No  . Drug use: No  . Sexual activity: No   Other Topics Concern  . None   Social History Narrative  . None    Review of Systems: A 12 point ROS discussed and pertinent positives are indicated in the HPI above.  All other systems are negative.  Review of Systems  Vital Signs: BP 132/74   Pulse 76   Temp 98.2 F (36.8 C)   Resp 18   Ht 5\' 1"  (1.549 m)   Wt 116 lb (52.6 kg)   SpO2 98%   BMI 21.92 kg/m   Physical Exam  Constitutional: She  is oriented to person, place, and time. She appears well-developed and well-nourished. No distress.  HENT:  Head: Normocephalic.  Mouth/Throat: Oropharynx is clear and moist.  Neck: Normal range of motion. No tracheal deviation present.  Cardiovascular: Normal rate, regular rhythm and normal heart sounds.   Pulmonary/Chest: Effort normal and breath sounds normal. No respiratory distress.  Neurological: She is alert and oriented to person, place, and time.  Skin: Skin is warm and dry.  Psychiatric: She has a normal mood and affect. Judgment normal.     Mallampati Score:  MD Evaluation Airway: WNL Heart: WNL Abdomen: WNL Chest/ Lungs: WNL ASA  Classification: 2 Mallampati/Airway Score: One  Imaging: Ir Radiologist Eval & Mgmt  Result Date: 12/13/2016 EXAM: NEW PATIENT OFFICE VISIT CHIEF COMPLAINT: Debilitating thoracic pain secondary to compression fracture at T8. Current Pain Level: 1-10 HISTORY OF PRESENT ILLNESS: The patient is a 76 year old right-handed lady who has been referred for evaluation of pain relief due to compression fracture at T8. The patient is accompanied by her husband. Basically, the patient reports a sudden onset of severe mid thoracic to anterior thoracic chest wall pain in mid January of this year upon a bout of coughing. The pain was immediate and catching her breath. Since that time the pain has receded to some degree with occasional severe exacerbations especially on stooping, bending, coughing or standing. Her baseline pain without medications is still 8 out 10. There is marginal relief with pain medications. She denies any radiation of pain to the upper cervical spine, or into the lower thoracic spine and into the lower extremities. She denies any autonomic dysfunction of her bowel or bladder activity. Since this fracture, the patient reports having to use a walker at home for ambulation. This has significantly restricted her her level of activities. She denies  recent chills, fever or rigors. She reports her weight is up and down commensurate with her appetite. She denies any chest pain, shortness of breath or wheezing or coughing or hemoptysis. She does report symptoms of dysphagia which she attributes to a history of esophageal stricture which she apparently gets dilated by her GI doctor. She, otherwise, reports no significant pathological symptomatology. Past Medical History: Significant for partial hysterectomy, cataract extraction, thyroid lobectomy on the right, breast biopsy and hand surgery in the distant past. Medications: Acetaminophen with codeine combination. Atorvastatin. Hydrochlorothiazide. Hydrocodone-homatropine p.r.n. as needed. Levothyroxine. Oxazepam. Potassium chloride. Carafate tablets and vitamin D3 500 units tablets. The patient is also on metoprolol which she takes intermittently for irregular heart rate. Allergies: Bisphosphonates, ciprofloxacin, codeine, and sulfa antibiotics. Social  History: The patient is married lives with husband. Four siblings alive well. Denies drinking alcohol. Has not smoked cigarettes for about 13 years, used to prior to that. She reports no use of illicit chemicals. Family History: Mother deceased at age 36, was murdered. Father deceased in the 75s with a goiter. Sister deceased age 79 with Alzheimer's. REVIEW OF SYSTEMS: Negative except for as mentioned above. PHYSICAL EXAMINATION: Appears to be in mild distress on account of the pain. Affect normal. Neurologically intact. Palpation of the thoracolumbar spine reveals tenderness at the level of approximately T6-T7 region. ASSESSMENT AND PLAN: The patient's MRI findings of the thoracic spine were reviewed with her and her husband. Brought to their attention was the signal abnormality due to the compression fracture at T8 associated with minimal retropulsion along the inferior endplate region. Also brought to their attention was a focal area of signal hyperintensity on  the T2 weighted images involving the mid T9 vertebral body without any evidence of compression deformity. Remote healed compression fractures were also noted in the thoracic spine at T5, T6, T7, T10 and T12. Given the patient's history and the imaging findings, the option of vertebral body augmentation at T8 was discussed with the patient and the patient's husband. The procedure, the risks, the benefits and alternatives were all reviewed in detail. Questions were answered regarding the procedure including complication and risks. The patient and her husband would like to proceed with vertebral body augmentation for pain relief, and also to prevent further collapse. The patient was asked to continue with conservative measures such as avoiding lifting, stooping or bending until the treatment of the fracture. She was also asked to use a cane or a walker for ambulation at home. Questions were answered to their satisfaction. The patient and her husband leave with good understanding and agreement with the above management plan. The patient will be scheduled for a T8 IR kyphoplasty or vertebroplasty as soon as possible. Electronically Signed   By: Luanne Bras M.D.   On: 12/10/2016 15:05    Labs:  CBC:  Recent Labs  04/15/16 1522 08/19/16 1437 12/23/16 0730  WBC 7.8 7.5 6.6  HGB  --   --  12.7  HCT 38.9 36.3 39.4  PLT 251 251 229    COAGS:  Recent Labs  12/23/16 0730  INR 1.02  APTT 30    BMP:  Recent Labs  04/15/16 1522 08/19/16 1437 12/23/16 0730  NA 143 142 139  K 3.5 3.9 3.6  CL 96 99 105  CO2 27 25 27   GLUCOSE 94 89 105*  BUN 17 17 14   CALCIUM 9.6 9.2 9.1  CREATININE 1.03* 1.01* 1.07*  GFRNONAA 53* 54* 49*  GFRAA 61 62 57*    LIVER FUNCTION TESTS:  Recent Labs  04/15/16 1522 08/19/16 1437  BILITOT 0.8 0.6  AST 21 22  ALT 12 13  ALKPHOS 85 82  PROT 7.2 6.9  ALBUMIN 4.5 4.3    TUMOR MARKERS: No results for input(s): AFPTM, CEA, CA199, CHROMGRNA in the  last 8760 hours.  Assessment and Plan: T8 compression fracture Labs ok Risks and Benefits discussed with the patient including, but not limited to education regarding the natural healing process of compression fractures without intervention, bleeding, infection, cement migration which may cause spinal cord damage, paralysis, pulmonary embolism or even death. All of the patient's questions were answered, patient is agreeable to proceed. Consent signed and in chart.     Thank you for this interesting consult.  I greatly enjoyed meeting [name] and look forward to participating in their care.  A copy of this report was sent to the requesting provider on this date.  Electronically Signed: Ascencion Dike 12/23/2016, 9:22 AM   I spent a total of 20 minutes in face to face in clinical consultation, greater than 50% of which was counseling/coordinating care for kyphoplasty

## 2016-12-23 NOTE — Sedation Documentation (Signed)
Dsg to back- T8 area intact

## 2016-12-23 NOTE — Procedures (Addendum)
S/P T 8 balloon KP with biopsy. Biopsy report to Dr Laurance Flatten as per his request

## 2016-12-23 NOTE — Discharge Instructions (Signed)
1.No stooping,bending or lifting more than 10 lbs for 2 weeks. 2 Use walker to ambulate for 2 weeks. 3.RTC IN 2 weeks PRN   KYPHOPLASTY/VERTEBROPLASTY DISCHARGE INSTRUCTIONS  Medications: (check all that apply)     Resume all home medications as before procedure.                       Continue your pain medications as prescribed as needed.  Over the next 3-5 days, decrease your pain medication as tolerated.  Over the counter medications (i.e. Tylenol, ibuprofen, and aleve) may be substituted once severe/moderate pain symptoms have subsided.   Wound Care: - Bandages may be removed the day following your procedure.  You may get your incision wet once bandages are removed.  Bandaids may be used to cover the incisions until scab formation.  Topical ointments are optional.  - If you develop a fever greater than 101 degrees, have increased skin redness at the incision sites or pus-like oozing from incisions occurring within 1 week of the procedure, contact radiology at [phone] or [phone].  - Ice pack to back for 15-20 minutes 2-3 time per day for first 2-3 days post procedure.  The ice will expedite muscle healing and help with the pain from the incisions.   Activity: - Bedrest today with limited activity for 24 hours post procedure.  - No driving for 48 hours.  - Increase your activity as tolerated after bedrest (with assistance if necessary).  - Refrain from any strenuous activity or heavy lifting (greater than 10 lbs.).   Follow up: - Contact radiology at [phone] or [phone] if any questions/concerns.  - A physician assistant from radiology will contact you in approximately 1 week.  - If a biopsy was performed at the time of your procedure, your referring physician should receive the results in usually 2-3 days.

## 2016-12-27 ENCOUNTER — Encounter (HOSPITAL_COMMUNITY): Payer: Self-pay | Admitting: Interventional Radiology

## 2016-12-28 ENCOUNTER — Telehealth (HOSPITAL_COMMUNITY): Payer: Self-pay

## 2016-12-28 NOTE — Telephone Encounter (Signed)
Called to schedule 2 wk f/u. Pt is doing good since procedure. Does not need f/u at this time. Will call if things change. AW

## 2017-01-04 ENCOUNTER — Other Ambulatory Visit: Payer: Self-pay | Admitting: Family Medicine

## 2017-01-12 ENCOUNTER — Ambulatory Visit (INDEPENDENT_AMBULATORY_CARE_PROVIDER_SITE_OTHER): Payer: Medicare HMO | Admitting: Family Medicine

## 2017-01-12 ENCOUNTER — Encounter: Payer: Self-pay | Admitting: Family Medicine

## 2017-01-12 VITALS — BP 135/82 | HR 105 | Temp 97.1°F | Ht 61.0 in | Wt 118.0 lb

## 2017-01-12 DIAGNOSIS — I10 Essential (primary) hypertension: Secondary | ICD-10-CM

## 2017-01-12 DIAGNOSIS — I491 Atrial premature depolarization: Secondary | ICD-10-CM

## 2017-01-12 DIAGNOSIS — E559 Vitamin D deficiency, unspecified: Secondary | ICD-10-CM | POA: Diagnosis not present

## 2017-01-12 DIAGNOSIS — E7800 Pure hypercholesterolemia, unspecified: Secondary | ICD-10-CM

## 2017-01-12 DIAGNOSIS — Z780 Asymptomatic menopausal state: Secondary | ICD-10-CM

## 2017-01-12 DIAGNOSIS — Z13820 Encounter for screening for osteoporosis: Secondary | ICD-10-CM

## 2017-01-12 MED ORDER — OXAZEPAM 15 MG PO CAPS: ORAL_CAPSULE | ORAL | 3 refills | Status: DC

## 2017-01-12 NOTE — Patient Instructions (Addendum)
Medicare Annual Wellness Visit  [facility] and the medical providers at [facility] strive to bring you the best medical care.  In doing so we not only want to address your current medical conditions and concerns but also to detect new conditions early and prevent illness, disease and health-related problems.    Medicare offers a yearly Wellness Visit which allows our clinical staff to assess your need for preventative services including immunizations, lifestyle education, counseling to decrease risk of preventable diseases and screening for fall risk and other medical concerns.    This visit is provided free of charge (no copay) for all Medicare recipients. The clinical pharmacists at [facility] have begun to conduct these Wellness Visits which will also include a thorough review of all your medications.    As you primary medical provider recommend that you make an appointment for your Annual Wellness Visit if you have not done so already this year.  You may set up this appointment before you leave today or you may call back [phone]) and schedule an appointment.  Please make sure when you call that you mention that you are scheduling your Annual Wellness Visit with the clinical pharmacist so that the appointment may be made for the proper length of time.     Continue current medications. Continue good therapeutic lifestyle changes which include good diet and exercise. Fall precautions discussed with patient. If an FOBT was given today- please return it to our front desk. If you are over 63 years old - you may need Prevnar 94 or the adult Pneumonia vaccine.  **Flu shots are available--- please call and schedule a FLU-CLINIC appointment**  After your visit with [place] today you will receive a survey in the mail or online from [employer] regarding your care with [place]. Please take a moment to fill this out. Your feedback is very  important to [place] as you can help [place] better understand your patient needs as well as improve your experience and satisfaction. WE CARE ABOUT YOU!!!   Try to walk regularly and make sure that you did not put yourself at risk for falling Drink plenty of fluids and stay well hydrated Do not use any overhead fans

## 2017-01-12 NOTE — Progress Notes (Signed)
Subjective:    Patient ID: [name], female    DOB: 1941-09-22, 76 y.o.   MRN: [id number]  HPI Pt here for follow up and management of chronic medical problems which includes hyperlipidemia and hypertension. She is taking medication regularly.The patient is status post a vertebroplasty. This is really help her back and she is feeling better. She is requesting refills today on her antianxiety medicine and he is due to get a mammogram soon. She is also due to get a DEXA scan and will return for this along with her lab work. The patient did see Dr. Oval Linsey, the cardiologist about 1 year ago and was seen because of frequent PACs and the patient that good about that visit and no further follow-ups were scheduled. She had a normal sinus rhythm with frequent PACs. Patient is not having any chest pain or shortness of breath. She's not having any trouble with her stomach other than some constipation and she takes MiraLAX for this and this helps. She denies any blood in the stool or black tarry bowel movements. She has no shortness of breath. She is passing her water without problems.     Patient Active Problem List   Diagnosis Date Noted  . PAC (premature atrial contraction) 12/30/2015  . Essential hypertension 12/18/2015  . Lower extremity edema 12/18/2015  . Palpitations 12/18/2015  . Hyperlipemia 02/14/2013  . Hypothyroid 02/14/2013  . Vitamin D deficiency 02/14/2013  . Generalized anxiety disorder 02/14/2013  . Constipation   . Esophageal stricture   . Osteoporosis with pathological fracture   . GERD (gastroesophageal reflux disease)    Outpatient Encounter Prescriptions as of 01/12/2017  Medication Sig  . acetaminophen (TYLENOL) 500 MG tablet Take 500 mg by mouth every 8 (eight) hours as needed for mild pain or moderate pain.  Marland Kitchen atorvastatin (LIPITOR) 80 MG tablet TAKE 1 TABLET EVERY DAY  . Cholecalciferol (VITAMIN D3) 5000 UNITS TABS Take 1 tablet by mouth daily.    .  hydrochlorothiazide (HYDRODIURIL) 25 MG tablet TAKE 1 TABLET EVERY DAY  . levothyroxine (SYNTHROID, LEVOTHROID) 100 MCG tablet Take 50 mcg by mouth See admin instructions. Takes Mon through Fri. DOES NOT TAKE ON SAT and SUN  . Multiple Vitamin (MULTIVITAMIN WITH MINERALS) TABS tablet Take 1 tablet by mouth daily.  Marland Kitchen oxazepam (SERAX) 15 MG capsule TAKE ONE (1) CAPSULE THREE (3) TIMES EACH DAY  . potassium chloride SA (K-DUR,KLOR-CON) 20 MEQ tablet Take 20 mEq by mouth every other day.  . sucralfate (CARAFATE) 1 g tablet Take 1 tablet (1 g total) by mouth 2 (two) times daily. (Patient taking differently: Take 1 g by mouth 2 (two) times daily as needed (Takes if having stomach pain). )   No facility-administered encounter medications on file as of 01/12/2017.      Review of Systems  Constitutional: Negative.   HENT: Negative.   Eyes: Negative.   Respiratory: Negative.   Cardiovascular: Negative.   Gastrointestinal: Negative.   Endocrine: Negative.   Genitourinary: Negative.   Musculoskeletal: Positive for back pain (still sore from recent vertbeaplasty).  Skin: Negative.   Allergic/Immunologic: Negative.   Neurological: Negative.   Hematological: Negative.   Psychiatric/Behavioral: Negative.        Objective:   Physical Exam  Constitutional: She is oriented to person, place, and time. She appears well-developed and well-nourished.  The patient is feisty as usual and pleasant and alert and feeling better after her vertebral plasty. She still has some discomfort in her back  but it is much improved.  HENT:  Head: Normocephalic and atraumatic.  Right Ear: External ear normal.  Left Ear: External ear normal.  Nose: Nose normal.  Mouth/Throat: Oropharynx is clear and moist. No oropharyngeal exudate.  Eyes: Conjunctivae and EOM are normal. Pupils are equal, round, and reactive to light. Right eye exhibits no discharge. Left eye exhibits no discharge. No scleral icterus.  Neck: Normal  range of motion. Neck supple. No thyromegaly present.  No bruits thyromegaly or anterior cervical adenopathy  Cardiovascular: Normal rate, normal heart sounds and intact distal pulses.   No murmur heard. The heart was irregular at 84-96/m.  Pulmonary/Chest: Effort normal and breath sounds normal. No respiratory distress. She has no wheezes. She has no rales.  Slightly decreased breath sounds but no wheezes or rhonchi  Abdominal: Soft. Bowel sounds are normal. She exhibits no mass. There is no tenderness. There is no rebound and no guarding.  No abdominal tenderness or masses.  Musculoskeletal: Normal range of motion. She exhibits no edema.  Lymphadenopathy:    She has no cervical adenopathy.  Neurological: She is alert and oriented to person, place, and time. She has normal reflexes. No cranial nerve deficit.  Skin: Skin is warm and dry. No rash noted.  Psychiatric: She has a normal mood and affect. Her behavior is normal. Judgment and thought content normal.  Nursing note and vitals reviewed.  BP 135/82 (BP Location: Left Arm)   Pulse (!) 105   Temp 97.1 F (36.2 C) (Oral)   Ht _0  (1.549 m)   Wt 118 lb (53.5 kg)   SpO2 98%   BMI 22.30 kg/m        Assessment & Plan:  1. Vitamin D deficiency -Continue current treatment pending results of lab work - CBC with Differential/Platelet; Future - VITAMIN D 25 Hydroxy (Vit-D Deficiency, Fractures); Future - DG [facility] DEXA; Future  2. Pure hypercholesterolemia -Continue atorvastatin and aggressive therapeutic lifestyle changes - CBC with Differential/Platelet; Future - Lipid panel; Future  3. Essential hypertension -The blood pressure is good today and she will continue with current treatment and sodium restriction and is much weight loss as possible - CBC with Differential/Platelet; Future - Hepatic function panel; Future - BMP8+EGFR; Future  4. Screening for osteoporosis -The patient is post menopausal and has vitamin D  deficiency and a DEXA scan is needed -She will continue with weightbearing exercise. - CBC with Differential/Platelet; Future - DG [facility] DEXA; Future  5. Postmenopausal - CBC with Differential/Platelet; Future - DG [facility] DEXA; Future  6. Premature atrial contractions -Follow-up only as needed with cardiology  Meds ordered this encounter  Medications  . oxazepam (SERAX) 15 MG capsule    Sig: TAKE ONE (1) CAPSULE THREE (3) TIMES EACH DAY    Dispense:  90 capsule    Refill:  3   Patient Instructions                       Medicare Annual Wellness Visit  [facility] and the medical providers at [facility] strive to bring you the best medical care.  In doing so we not only want to address your current medical conditions and concerns but also to detect new conditions early and prevent illness, disease and health-related problems.    Medicare offers a yearly Wellness Visit which allows our clinical staff to assess your need for preventative services including immunizations, lifestyle education, counseling to decrease risk of preventable diseases and screening  for fall risk and other medical concerns.    This visit is provided free of charge (no copay) for all Medicare recipients. The clinical pharmacists at [facility] have begun to conduct these Wellness Visits which will also include a thorough review of all your medications.    As you primary medical provider recommend that you make an appointment for your Annual Wellness Visit if you have not done so already this year.  You may set up this appointment before you leave today or you may call back [phone]) and schedule an appointment.  Please make sure when you call that you mention that you are scheduling your Annual Wellness Visit with the clinical pharmacist so that the appointment may be made for the proper length of time.     Continue current medications. Continue good therapeutic lifestyle  changes which include good diet and exercise. Fall precautions discussed with patient. If an FOBT was given today- please return it to our front desk. If you are over 15 years old - you may need Prevnar 40 or the adult Pneumonia vaccine.  **Flu shots are available--- please call and schedule a FLU-CLINIC appointment**  After your visit with [place] today you will receive a survey in the mail or online from [employer] regarding your care with [place]. Please take a moment to fill this out. Your feedback is very important to [place] as you can help [place] better understand your patient needs as well as improve your experience and satisfaction. WE CARE ABOUT YOU!!!   Try to walk regularly and make sure that you did not put yourself at risk for falling Drink plenty of fluids and stay well hydrated Do not use any overhead fans    Arrie Senate MD

## 2017-01-24 ENCOUNTER — Telehealth: Payer: Self-pay | Admitting: Family Medicine

## 2017-01-25 NOTE — Telephone Encounter (Signed)
Pt aware of appointment date/time of 02/01/17 at 10am

## 2017-02-01 ENCOUNTER — Ambulatory Visit (INDEPENDENT_AMBULATORY_CARE_PROVIDER_SITE_OTHER): Payer: Medicare HMO

## 2017-02-01 DIAGNOSIS — Z13820 Encounter for screening for osteoporosis: Secondary | ICD-10-CM

## 2017-02-01 DIAGNOSIS — E7800 Pure hypercholesterolemia, unspecified: Secondary | ICD-10-CM | POA: Diagnosis not present

## 2017-02-01 DIAGNOSIS — Z780 Asymptomatic menopausal state: Secondary | ICD-10-CM

## 2017-02-01 DIAGNOSIS — I10 Essential (primary) hypertension: Secondary | ICD-10-CM | POA: Diagnosis not present

## 2017-02-01 DIAGNOSIS — E559 Vitamin D deficiency, unspecified: Secondary | ICD-10-CM | POA: Diagnosis not present

## 2017-02-01 NOTE — Addendum Note (Signed)
Addended by: Pollyann Kennedy F on: 02/01/2017 10:02 AM   Modules accepted: Orders

## 2017-02-02 LAB — CBC WITH DIFFERENTIAL/PLATELET
BASOS ABS: 0 10*3/uL (ref 0.0–0.2)
Basos: 1 %
EOS (ABSOLUTE): 0.2 10*3/uL (ref 0.0–0.4)
EOS: 4 %
HEMOGLOBIN: 12 g/dL (ref 11.1–15.9)
Hematocrit: 35.9 % (ref 34.0–46.6)
IMMATURE GRANS (ABS): 0 10*3/uL (ref 0.0–0.1)
IMMATURE GRANULOCYTES: 0 %
LYMPHS ABS: 1.7 10*3/uL (ref 0.7–3.1)
LYMPHS: 31 %
MCH: 29.8 pg (ref 26.6–33.0)
MCHC: 33.4 g/dL (ref 31.5–35.7)
MCV: 89 fL (ref 79–97)
MONOCYTES: 11 %
Monocytes Absolute: 0.6 10*3/uL (ref 0.1–0.9)
Neutrophils Absolute: 3 10*3/uL (ref 1.4–7.0)
Neutrophils: 53 %
Platelets: 247 10*3/uL (ref 150–379)
RBC: 4.03 x10E6/uL (ref 3.77–5.28)
RDW: 13.2 % (ref 12.3–15.4)
WBC: 5.6 10*3/uL (ref 3.4–10.8)

## 2017-02-02 LAB — LIPID PANEL
CHOL/HDL RATIO: 3.1 ratio (ref 0.0–4.4)
CHOLESTEROL TOTAL: 143 mg/dL (ref 100–199)
HDL: 46 mg/dL (ref >39)
LDL Calculated: 75 mg/dL (ref 0–99)
Triglycerides: 109 mg/dL (ref 0–149)
VLDL CHOLESTEROL CAL: 22 mg/dL (ref 5–40)

## 2017-02-02 LAB — VITAMIN D 25 HYDROXY (VIT D DEFICIENCY, FRACTURES): Vit D, 25-Hydroxy: 35.1 ng/mL (ref 30.0–100.0)

## 2017-02-02 LAB — BMP8+EGFR
BUN / CREAT RATIO: 13 (ref 12–28)
BUN: 15 mg/dL (ref 8–27)
CALCIUM: 9.4 mg/dL (ref 8.7–10.3)
CHLORIDE: 101 mmol/L (ref 96–106)
CO2: 27 mmol/L (ref 18–29)
CREATININE: 1.19 mg/dL — AB (ref 0.57–1.00)
GFR calc Af Amer: 51 mL/min/{1.73_m2} — ABNORMAL LOW (ref >59)
GFR calc non Af Amer: 44 mL/min/{1.73_m2} — ABNORMAL LOW (ref >59)
Glucose: 98 mg/dL (ref 65–99)
Potassium: 3.5 mmol/L (ref 3.5–5.2)
Sodium: 144 mmol/L (ref 134–144)

## 2017-02-02 LAB — HEPATIC FUNCTION PANEL
ALBUMIN: 4.1 g/dL (ref 3.5–4.8)
ALK PHOS: 88 IU/L (ref 39–117)
ALT: 11 IU/L (ref 0–32)
AST: 20 IU/L (ref 0–40)
BILIRUBIN TOTAL: 0.6 mg/dL (ref 0.0–1.2)
Bilirubin, Direct: 0.15 mg/dL (ref 0.00–0.40)
TOTAL PROTEIN: 6.9 g/dL (ref 6.0–8.5)

## 2017-02-05 ENCOUNTER — Emergency Department (HOSPITAL_COMMUNITY)
Admission: EM | Admit: 2017-02-05 | Discharge: 2017-02-05 | Disposition: A | Discharge status: Home / Self Care | Payer: Medicare HMO | Attending: Emergency Medicine | Admitting: Emergency Medicine

## 2017-02-05 ENCOUNTER — Encounter (HOSPITAL_COMMUNITY): Payer: Self-pay | Admitting: Nurse Practitioner

## 2017-02-05 ENCOUNTER — Emergency Department (HOSPITAL_COMMUNITY): Payer: Medicare HMO

## 2017-02-05 ENCOUNTER — Ambulatory Visit (INDEPENDENT_AMBULATORY_CARE_PROVIDER_SITE_OTHER): Payer: Medicare HMO | Admitting: Family Medicine

## 2017-02-05 VITALS — BP 158/86 | HR 92 | Temp 97.8°F | Ht 61.0 in | Wt 117.0 lb

## 2017-02-05 DIAGNOSIS — R079 Chest pain, unspecified: Secondary | ICD-10-CM

## 2017-02-05 DIAGNOSIS — E039 Hypothyroidism, unspecified: Secondary | ICD-10-CM | POA: Diagnosis not present

## 2017-02-05 DIAGNOSIS — Z79899 Other long term (current) drug therapy: Secondary | ICD-10-CM | POA: Insufficient documentation

## 2017-02-05 DIAGNOSIS — Z87891 Personal history of nicotine dependence: Secondary | ICD-10-CM | POA: Insufficient documentation

## 2017-02-05 DIAGNOSIS — K2100 Gastro-esophageal reflux disease with esophagitis, without bleeding: Secondary | ICD-10-CM

## 2017-02-05 DIAGNOSIS — Z85828 Personal history of other malignant neoplasm of skin: Secondary | ICD-10-CM | POA: Insufficient documentation

## 2017-02-05 DIAGNOSIS — I10 Essential (primary) hypertension: Secondary | ICD-10-CM | POA: Diagnosis not present

## 2017-02-05 DIAGNOSIS — R1310 Dysphagia, unspecified: Secondary | ICD-10-CM

## 2017-02-05 DIAGNOSIS — K219 Gastro-esophageal reflux disease without esophagitis: Secondary | ICD-10-CM | POA: Diagnosis present

## 2017-02-05 DIAGNOSIS — R0789 Other chest pain: Secondary | ICD-10-CM | POA: Diagnosis not present

## 2017-02-05 LAB — CBC
HEMATOCRIT: 41.5 % (ref 36.0–46.0)
HEMOGLOBIN: 13.4 g/dL (ref 12.0–15.0)
MCH: 29.5 pg (ref 26.0–34.0)
MCHC: 32.3 g/dL (ref 30.0–36.0)
MCV: 91.4 fL (ref 78.0–100.0)
Platelets: 247 10*3/uL (ref 150–400)
RBC: 4.54 MIL/uL (ref 3.87–5.11)
RDW: 13.6 % (ref 11.5–15.5)
WBC: 6.6 10*3/uL (ref 4.0–10.5)

## 2017-02-05 LAB — TROPONIN I: Troponin I: <0.03 ng/mL (ref <0.03)

## 2017-02-05 LAB — COMPREHENSIVE METABOLIC PANEL
ALBUMIN: 4.2 g/dL (ref 3.5–5.0)
ALK PHOS: 88 U/L (ref 38–126)
ALT: 15 U/L (ref 14–54)
AST: 29 U/L (ref 15–41)
Anion gap: 13 (ref 5–15)
BILIRUBIN TOTAL: 1.1 mg/dL (ref 0.3–1.2)
BUN: 17 mg/dL (ref 6–20)
CALCIUM: 9.5 mg/dL (ref 8.9–10.3)
CO2: 28 mmol/L (ref 22–32)
Chloride: 98 mmol/L — ABNORMAL LOW (ref 101–111)
Creatinine, Ser: 1.27 mg/dL — ABNORMAL HIGH (ref 0.44–1.00)
GFR calc Af Amer: 46 mL/min — ABNORMAL LOW (ref >60)
GFR calc non Af Amer: 40 mL/min — ABNORMAL LOW (ref >60)
GLUCOSE: 107 mg/dL — AB (ref 65–99)
Potassium: 3.3 mmol/L — ABNORMAL LOW (ref 3.5–5.1)
SODIUM: 139 mmol/L (ref 135–145)
TOTAL PROTEIN: 7.4 g/dL (ref 6.5–8.1)

## 2017-02-05 MED ORDER — ONDANSETRON 4 MG PO TBDP
ORAL_TABLET | ORAL | 0 refills | Status: DC
Start: 2017-02-05 — End: 2017-02-16

## 2017-02-05 MED ORDER — ASPIRIN 81 MG PO CHEW
324.0000 mg | CHEWABLE_TABLET | Freq: Once | ORAL | Status: AC
  Administered 2017-02-05: 324 mg via ORAL
  Filled 2017-02-05: qty 4

## 2017-02-05 MED ORDER — GI COCKTAIL ~~LOC~~
30.0000 mL | Freq: Once | ORAL | Status: AC
  Administered 2017-02-05: 30 mL via ORAL
  Filled 2017-02-05: qty 30

## 2017-02-05 MED ORDER — FAMOTIDINE 40 MG/5ML PO SUSR
20.0000 mg | Freq: Once | ORAL | Status: AC
  Administered 2017-02-05: 20 mg via ORAL
  Filled 2017-02-05: qty 2.5

## 2017-02-05 MED ORDER — FAMOTIDINE 40 MG/5ML PO SUSR: 20.0000 mg | Freq: Every day | ORAL | 0 refills | Status: DC

## 2017-02-05 MED ORDER — GI COCKTAIL ~~LOC~~: 30.0000 mL | Freq: Once | ORAL | Status: DC

## 2017-02-05 NOTE — ED Triage Notes (Signed)
Pt presents with c/o GERD. Her symptoms began last night. She reports a burning pain in her throat and esophagus that is worse after she eats. She has been unable to swallow any food or her daily medications. She denies fevers, CP, SOB, cough. She denies any pain now

## 2017-02-05 NOTE — Patient Instructions (Signed)
Great to see you!  Please go to the emergency room for evaluation at [facility]

## 2017-02-05 NOTE — Discharge Instructions (Signed)
If you were given medicines take as directed.  If you are on coumadin or contraceptives realize their levels and effectiveness is altered by many different medicines.  If you have any reaction (rash, tongues swelling, other) to the medicines stop taking and see a physician.    If your blood pressure was elevated in the ER make sure you follow up for management with a primary doctor or return for chest pain, shortness of breath or stroke symptoms.  Please follow up as directed and return to the ER or see a physician for new or worsening symptoms.  Thank you. Vitals:   02/05/17 1349 02/05/17 1530  BP: (!) 149/87 140/70  Pulse: (!) 109 94  Resp: 17   Temp: 98.5 F (36.9 C)   TempSrc: Oral   SpO2: 98% 99%

## 2017-02-05 NOTE — ED Provider Notes (Signed)
[facility] DEPT Provider Note   CSN: [id number] Arrival date & time: 02/05/17  1338     History   Chief Complaint Chief Complaint  Patient presents with  . Gastroesophageal Reflux    HPI [name] is a 76 y.o. female.  Patient with history of anxiety, reflux, hiatal hernia, esophageal stricture requiring dilation presents with persistent reflux like symptoms since last night. This is similar to previous however lasting longer. Patient denies any cardiac history. Patient denies any ulcers or blood in the stools. Patient's had mild spitting up/nausea. Patient unable to tolerate any food or her medicines that her pills since last night.      Past Medical History:  Diagnosis Date  . Anxiety states   . Breast cyst   . Cancer ([place])    skin  . Cataract   . Constipation   . Esophageal stricture   . Essential hypertension 12/18/2015  . Fracture, rib   . GERD (gastroesophageal reflux disease)   . Hiatal hernia   . Lower extremity edema 12/18/2015  . Osteoporosis   . Other and unspecified hyperlipidemia   . PAC (premature atrial contraction) 12/30/2015  . Palpitations 12/18/2015  . Unspecified hypothyroidism   . Vertebral fracture, osteoporotic [facility])     Patient Active Problem List   Diagnosis Date Noted  . PAC (premature atrial contraction) 12/30/2015  . Essential hypertension 12/18/2015  . Lower extremity edema 12/18/2015  . Palpitations 12/18/2015  . Hyperlipemia 02/14/2013  . Hypothyroid 02/14/2013  . Vitamin D deficiency 02/14/2013  . Generalized anxiety disorder 02/14/2013  . Constipation   . Esophageal stricture   . Osteoporosis with pathological fracture   . GERD (gastroesophageal reflux disease)     Past Surgical History:  Procedure Laterality Date  . BREAST BIOPSY    . CATARACT EXTRACTION    . EYE SURGERY    . HAND SURGERY    . IR GENERIC HISTORICAL  12/10/2016   IR RADIOLOGIST EVAL & MGMT 12/10/2016 [facility]INTER[facility]  . IR GENERIC HISTORICAL   12/23/2016   IR KYPHO THORACIC WITH BONE BIOPSY 12/23/2016 Luanne Bras, MD [facility]INTER[facility] RAD  . PARTIAL HYSTERECTOMY    . THYROID LOBECTOMY     right  . VERTEBROPLASTY      OB History    No data available       Home Medications    Prior to Admission medications   Medication Sig Start Date End Date Taking? Authorizing Provider  acetaminophen (TYLENOL) 500 MG tablet Take 500 mg by mouth every 8 (eight) hours as needed for mild pain or moderate pain.    Historical Provider, MD  atorvastatin (LIPITOR) 80 MG tablet TAKE 1 TABLET EVERY DAY 01/05/17   Chipper Herb, MD  Cholecalciferol (VITAMIN D3) 5000 UNITS TABS Take 1 tablet by mouth daily.      Historical Provider, MD  hydrochlorothiazide (HYDRODIURIL) 25 MG tablet TAKE 1 TABLET EVERY DAY 01/05/17   Chipper Herb, MD  levothyroxine (SYNTHROID, LEVOTHROID) 100 MCG tablet Take 50 mcg by mouth See admin instructions. Takes Mon through Fri. DOES NOT TAKE ON SAT and SUN    Historical Provider, MD  Multiple Vitamin (MULTIVITAMIN WITH MINERALS) TABS tablet Take 1 tablet by mouth daily.    Historical Provider, MD  oxazepam (SERAX) 15 MG capsule TAKE ONE (1) CAPSULE THREE (3) TIMES EACH DAY 01/12/17   Chipper Herb, MD  potassium chloride SA (K-DUR,KLOR-CON) 20 MEQ tablet Take 20 mEq by mouth every other day.  Historical Provider, MD  sucralfate (CARAFATE) 1 g tablet Take 1 tablet (1 g total) by mouth 2 (two) times daily. Patient taking differently: Take 1 g by mouth 2 (two) times daily as needed (Takes if having stomach pain).  04/15/16   Chipper Herb, MD    Family History Family History  Problem Relation Age of Onset  . Goiter Father   . Alcohol abuse Father   . Heart disease Sister   . Cancer Sister     breast and skin  . Hernia Brother   . Cancer Brother     throat  . Cancer Brother   . Heart disease Brother   . Early death Brother     auto accident    Social History Social History  Substance Use Topics  . Smoking status:  Former Research scientist (life sciences)  . Smokeless tobacco: Never Used     Comment: Non-smoker  . Alcohol use No     Allergies   Bisphosphonates; Ciprofloxacin; Codeine; and Sulfa antibiotics   Review of Systems Review of Systems  Constitutional: Negative for chills and fever.  HENT: Negative for congestion.   Eyes: Negative for visual disturbance.  Respiratory: Negative for shortness of breath.   Cardiovascular: Positive for chest pain (burning).  Gastrointestinal: Positive for abdominal pain (mild epigastric). Negative for vomiting.  Genitourinary: Negative for dysuria and flank pain.  Musculoskeletal: Negative for back pain, neck pain and neck stiffness.  Skin: Negative for rash.  Neurological: Negative for light-headedness and headaches.     Physical Exam Updated Vital Signs BP (!) 149/87   Pulse (!) 109   Temp 98.5 F (36.9 C) (Oral)   Resp 17   SpO2 98%   Physical Exam  Constitutional: She is oriented to person, place, and time. She appears well-developed and well-nourished.  HENT:  Head: Normocephalic and atraumatic.  Eyes: Conjunctivae are normal. Right eye exhibits no discharge. Left eye exhibits no discharge.  Neck: Normal range of motion. Neck supple. No tracheal deviation present.  Cardiovascular: Normal rate and regular rhythm.   Pulmonary/Chest: Effort normal and breath sounds normal.  Abdominal: Soft. She exhibits no distension. There is tenderness (mild epig). There is no guarding.  Musculoskeletal: She exhibits no edema.  Neurological: She is alert and oriented to person, place, and time.  Skin: Skin is warm. No rash noted.  Psychiatric: She has a normal mood and affect.  Nursing note and vitals reviewed.    ED Treatments / Results  Labs (all labs ordered are listed, but only abnormal results are displayed) Labs Reviewed  CBC  TROPONIN I  COMPREHENSIVE METABOLIC PANEL    EKG  EKG Interpretation None       Radiology No results  found.  Procedures Procedures (including critical care time)  EMERGENCY DEPARTMENT BILIARY ULTRASOUND INTERPRETATION "Study: Limited Abdominal Ultrasound of the Gallbladder and Common Bile Duct."  INDICATIONS: Abdominal pain Indication: Multiple views of the gallbladder and common bile duct were obtained in real-time with a Multi-frequency probe."  PERFORMED BY:  Myself IMAGES ARCHIVED?: Yes LIMITATIONS: Body habitus and Bowel gas INTERPRETATION: Normal and CBD not visualized    Medications Ordered in ED Medications  aspirin chewable tablet 324 mg (not administered)  famotidine (PEPCID) 40 MG/5ML suspension 20 mg (not administered)  gi cocktail (Maalox,Lidocaine,Donnatal) (not administered)     Initial Impression / Assessment and Plan / ED Course  I have reviewed the triage vital signs and the nursing notes.  Pertinent labs & imaging results that were available during my  care of the patient were reviewed by me and considered in my medical decision making (see chart for details).    Patient presents with persistent reflux-like symptoms. Discussed likely, nation of her reflux history/esophageal stricture/hiatal hernia. With her age and persistent symptoms plan for cardiac screen. Bedside ultrasound no signs of Cholecystitis no significant gallstones visualized. Supportive care and reflux medicines ordered.  Results and differential diagnosis were discussed with the patient/parent/guardian. Xrays were independently reviewed by myself.  Close follow up outpatient was discussed, comfortable with the plan.   Medications  aspirin chewable tablet 324 mg (not administered)  famotidine (PEPCID) 40 MG/5ML suspension 20 mg (not administered)  gi cocktail (Maalox,Lidocaine,Donnatal) (not administered)    Vitals:   02/05/17 1349  BP: (!) 149/87  Pulse: (!) 109  Resp: 17  Temp: 98.5 F (36.9 C)  TempSrc: Oral  SpO2: 98%    Final diagnoses:  Gastroesophageal reflux disease with  esophagitis  Acute chest pain     Final Clinical Impressions(s) / ED Diagnoses   Final diagnoses:  Gastroesophageal reflux disease with esophagitis  Acute chest pain    New Prescriptions New Prescriptions   No medications on file     Elnora Morrison, MD 02/07/17 9509

## 2017-02-05 NOTE — Progress Notes (Signed)
   HPI  Patient presents today here with reflux.  Patient explains that last night she began having severe heartburn, she then states that it returned this morning. She states that whenever she tries to drink or eat something she has a fullness in the center of her chest, and then it comes back up with hot liquid.  She denies shortness of breath, chest pain, weakness or fatigue.  She's had difficulty tolerating fluids and her medicines this morning.  PMH: Smoking status noted ROS: Per HPI  Objective: BP (!) 158/86   Pulse 92   Temp 97.8 F (36.6 C) (Oral)   Ht 5\' 1"  (1.549 m)   Wt 117 lb (53.1 kg)   BMI 22.11 kg/m  Gen: NAD, alert, cooperative with exam HEENT: NCAT, oropharynx moist CV: RRR, good S1/S2, no murmur Resp: CTABL, no wheezes, non-labored Abd: Soft, mild tenderness to palpation of the right upper quadrant without guarding, positive bowel sounds Ext: No edema, warm Neuro: Alert and oriented, No gross deficits  Patient given GI cocktail in the clinic, she took about 10 mL's and it "came back up.  Assessment and plan:  # Dysphagia Patient with difficulty swallowing, describes that fluids go down" come back up". I'm concerned about recurrent esophageal stricture. 2008 she had 3 dilations with Dr. Watt Climes Given age and inability to take medications or even tolerate fluids in the clinic I recommended seeking emergency medical care at [facility] emergency room, triage nurse has been informed. I appreciate their recommendations and management.    Meds ordered this encounter  Medications  . gi cocktail (Maalox,Lidocaine,Donnatal)    Laroy Apple, MD [facility] 02/05/2017, 12:05 PM

## 2017-02-16 ENCOUNTER — Ambulatory Visit (INDEPENDENT_AMBULATORY_CARE_PROVIDER_SITE_OTHER): Payer: Medicare HMO | Admitting: Pharmacist

## 2017-02-16 ENCOUNTER — Encounter: Payer: Self-pay | Admitting: Pharmacist

## 2017-02-16 VITALS — BP 104/66 | HR 70 | Ht 61.0 in | Wt 121.0 lb

## 2017-02-16 DIAGNOSIS — M810 Age-related osteoporosis without current pathological fracture: Secondary | ICD-10-CM

## 2017-02-16 MED ORDER — CALCIUM-PHOSPHORUS-VITAMIN D 250-115-250 MG-MG-UNIT PO CHEW: 1.0000 | CHEWABLE_TABLET | Freq: Two times a day (BID) | ORAL | Status: DC

## 2017-02-16 NOTE — Progress Notes (Signed)
Patient ID: [name], female   DOB: July 01, 1941, 76 y.o.   MRN: [id number]    HPI: Patient with osteoporosis and history of compression fracture.  12/23/2016 she had vertebral body augmentation of T8 by Dr Dora Sims.  Patient report that back pain is much improved Kyphosis?  Yes Prior fracture?  Yes - ribs and compression fractures [name] is not currently taking any prescription medication for osteoporosis.  She took Forteo for 2 years 2009 - 2011 with good results. She is not able to take oral bisphosphnates due to esophagitis and severe GERD.  In the past we have discussed both Prolia and Reclast therapy but patient has refuse in that past due to cost.  The last conversation was 2 years ago when I checked the cost of Blomkest.  Was $97 copay however patient declined because of cost.  We checked with the safety net foundation but she did not qualify for assistance at that time. She has also tried Miacalcin NS but caused nose bleeds.                                                                PMH: Age at menopause:  76 yo surgical Hysterectomy?  Yes Oophorectomy?  Yes HRT? No Steroid Use?  No Thyroid med?  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  Yes - espohagitis and difficulty swallowing Currently taking famotidine Current or previous eating disorders?  No Last Vitamin D Result:  35.1 (02/01/2017) Last GFR Result:  44 (02/01/2017)   FH/SH: Family history of osteoporosis?  Yes - sister Parent with history of hip fracture?  No Family history of breast cancer?  Yes - sister Exercise?  Yes - a little walking.  In past she has broken ribs on recumbent bike Smoking?  No Alcohol?  No    Calcium Assessment Calcium Intake  # of servings/day  Calcium mg  Milk (8 oz) 0.5  x  300  = 150mg   Yogurt (4 oz) 0 x  200 = 0  Cheese (1 oz) 1 x  200 = 200mg   Other Calcium sources   250mg   Ca supplement 0 = 0   Estimated calcium intake per day  600mg     Today's Vitals   02/16/17 1426  BP: 104/66  Pulse: 70  Weight: 121 lb (54.9 kg)  Height: 5\' 1"  (1.549 m)     DEXA Results Date of Test T-Score for AP Spine L1-L4 T-Score for radius of right forearm T-Score for Total Right Hip  02/01/2017 -- -5.7 -4.6  12/18/2014 -5.5  -4.7  08/30/2012 -5.1  -4.7  08/12/2010 -4.4  -3.8  01/15/2009 -4.5  -4.1   Assessment: Osteoporosis stable BMD at right hip  Recommendations: 1.  Again discuss treatment options. Patient is willing to try either Reclast or Prolia if cost is not took high.  Will look into insurance coverage 2.  recommend calcium 1200mg  daily through supplementation or diet. Patient to start calcium citrate gummies bid 3.  recommend weight bearing exercise - walking only  4.  Counseled and educated about fall risk and prevention. 5.  It appears that bone biopsy was ordered when kyphoplasty was performed but I was unable to see results - discussed with Dr Tawanna Sat  nurse and she will look into order.   Recheck DEXA:  2 years  Time spent counseling patient:  30 minutes   Cherre Robins, PharmD, CPP

## 2017-02-16 NOTE — Patient Instructions (Addendum)
Start calcium citrate 315mg  gummies - take 1 twice a day I am going to check insurance coverage for Prolia - an injection that you get every 6 months.   Fall Prevention in the Home Falls can cause injuries and can affect people from all age groups. There are many simple things that you can do to make your home safe and to help prevent falls. What can I do on the outside of my home?  Regularly repair the edges of walkways and driveways and fix any cracks.  Remove high doorway thresholds.  Trim any shrubbery on the main path into your home.  Use bright outdoor lighting.  Clear walkways of debris and clutter, including tools and rocks.  Regularly check that handrails are securely fastened and in good repair. Both sides of any steps should have handrails.  Install guardrails along the edges of any raised decks or porches.  Have leaves, snow, and ice cleared regularly.  Use sand or salt on walkways during winter months.  In the garage, clean up any spills right away, including grease or oil spills. What can I do in the bathroom?  Use night lights.  Install grab bars by the toilet and in the tub and shower. Do not use towel bars as grab bars.  Use non-skid mats or decals on the floor of the tub or shower.  If you need to sit down while you are in the shower, use a plastic, non-slip stool.  Keep the floor dry. Immediately clean up any water that spills on the floor.  Remove soap buildup in the tub or shower on a regular basis.  Attach bath mats securely with double-sided non-slip rug tape.  Remove throw rugs and other tripping hazards from the floor. What can I do in the bedroom?  Use night lights.  Make sure that a bedside light is easy to reach.  Do not use oversized bedding that drapes onto the floor.  Have a firm chair that has side arms to use for getting dressed.  Remove throw rugs and other tripping hazards from the floor. What can I do in the kitchen?  Clean  up any spills right away.  Avoid walking on wet floors.  Place frequently used items in easy-to-reach places.  If you need to reach for something above you, use a sturdy step stool that has a grab bar.  Keep electrical cables out of the way.  Do not use floor polish or wax that makes floors slippery. If you have to use wax, make sure that it is non-skid floor wax.  Remove throw rugs and other tripping hazards from the floor. What can I do in the stairways?  Do not leave any items on the stairs.  Make sure that there are handrails on both sides of the stairs. Fix handrails that are broken or loose. Make sure that handrails are as long as the stairways.  Check any carpeting to make sure that it is firmly attached to the stairs. Fix any carpet that is loose or worn.  Avoid having throw rugs at the top or bottom of stairways, or secure the rugs with carpet tape to prevent them from moving.  Make sure that you have a light switch at the top of the stairs and the bottom of the stairs. If you do not have them, have them installed. What are some other fall prevention tips?  Wear closed-toe shoes that fit well and support your feet. Wear shoes that have rubber soles or  low heels.  When you use a stepladder, make sure that it is completely opened and that the sides are firmly locked. Have someone hold the ladder while you are using it. Do not climb a closed stepladder.  Add color or contrast paint or tape to grab bars and handrails in your home. Place contrasting color strips on the first and last steps.  Use mobility aids as needed, such as canes, walkers, scooters, and crutches.  Turn on lights if it is dark. Replace any light bulbs that burn out.  Set up furniture so that there are clear paths. Keep the furniture in the same spot.  Fix any uneven floor surfaces.  Choose a carpet design that does not hide the edge of steps of a stairway.  Be aware of any and all pets.  Review your  medicines with your healthcare provider. Some medicines can cause dizziness or changes in blood pressure, which increase your risk of falling. Talk with your health care provider about other ways that you can decrease your risk of falls. This may include working with a physical therapist or trainer to improve your strength, balance, and endurance. This information is not intended to replace advice given to you by your health care provider. Make sure you discuss any questions you have with your health care provider. Document Released: 09/24/2002 Document Revised: 03/02/2016 Document Reviewed: 11/08/2014 Elsevier Interactive Patient Education  2017 [employer].

## 2017-03-10 ENCOUNTER — Other Ambulatory Visit: Payer: Self-pay | Admitting: Family Medicine

## 2017-04-08 ENCOUNTER — Ambulatory Visit (HOSPITAL_COMMUNITY)
Admission: RE | Admit: 2017-04-08 | Discharge: 2017-04-08 | Disposition: A | Discharge status: Home / Self Care | Payer: Medicare HMO | Source: Ambulatory Visit | Attending: Family Medicine | Admitting: Family Medicine

## 2017-04-08 ENCOUNTER — Ambulatory Visit (INDEPENDENT_AMBULATORY_CARE_PROVIDER_SITE_OTHER): Payer: Medicare HMO | Admitting: Family Medicine

## 2017-04-08 ENCOUNTER — Encounter: Payer: Self-pay | Admitting: Family Medicine

## 2017-04-08 VITALS — BP 125/80 | HR 87 | Temp 97.7°F | Ht 61.0 in | Wt 116.4 lb

## 2017-04-08 DIAGNOSIS — R600 Localized edema: Secondary | ICD-10-CM | POA: Diagnosis not present

## 2017-04-08 DIAGNOSIS — M79662 Pain in left lower leg: Secondary | ICD-10-CM | POA: Diagnosis not present

## 2017-04-08 DIAGNOSIS — I824Z3 Acute embolism and thrombosis of unspecified deep veins of distal lower extremity, bilateral: Secondary | ICD-10-CM

## 2017-04-08 DIAGNOSIS — M7989 Other specified soft tissue disorders: Secondary | ICD-10-CM | POA: Diagnosis not present

## 2017-04-08 DIAGNOSIS — R7989 Other specified abnormal findings of blood chemistry: Secondary | ICD-10-CM | POA: Diagnosis not present

## 2017-04-08 MED ORDER — METOPROLOL TARTRATE 25 MG PO TABS: 25.0000 mg | ORAL_TABLET | Freq: Two times a day (BID) | ORAL | 0 refills | Status: DC

## 2017-04-08 NOTE — Progress Notes (Signed)
BP 125/80   Pulse 87   Temp 97.7 F (36.5 C) (Oral)   Ht 5' 1"  (1.549 m)   Wt 116 lb 6.4 oz (52.8 kg)   BMI 21.99 kg/m    Subjective:    Patient ID: [name], female    DOB: May 11, 1941, 76 y.o.   MRN: [id number]  HPI: [name] is a 76 y.o. female presenting on 04/08/2017 for Foot Swelling (bilateral. Patient states that it has been on going but has gotten worse the last week.)   HPI Left leg pain and swelling Left leg pain and swelling in going past the. She has had pain and swelling in the left foot especially. She does have some swelling in the right leg but not much left. She denies any fevers or chills or redness or warmth. She denies any numbness or weakness. She does have chronic issues with swelling but this is more than she usually has.  Relevant past medical, surgical, family and social history reviewed and updated as indicated. Interim medical history since our last visit reviewed. Allergies and medications reviewed and updated.  Review of Systems  Constitutional: Negative for chills and fever.  HENT: Negative for congestion, ear discharge and ear pain.   Respiratory: Negative for cough, chest tightness, shortness of breath and wheezing.   Cardiovascular: Positive for leg swelling. Negative for chest pain.  Genitourinary: Negative for difficulty urinating and dysuria.  Musculoskeletal: Negative for back pain and gait problem.  Skin: Negative for color change and rash.  Psychiatric/Behavioral: Negative for agitation and behavioral problems.  All other systems reviewed and are negative.   Per HPI unless specifically indicated above        Objective:    BP 125/80   Pulse 87   Temp 97.7 F (36.5 C) (Oral)   Ht 5' 1"  (1.549 m)   Wt 116 lb 6.4 oz (52.8 kg)   BMI 21.99 kg/m   Wt Readings from Last 3 Encounters:  04/08/17 116 lb 6.4 oz (52.8 kg)  02/16/17 121 lb (54.9 kg)  02/05/17 117 lb (53.1 kg)    Physical Exam  Constitutional: She is  oriented to person, place, and time. She appears well-developed and well-nourished. No distress.  Eyes: Conjunctivae are normal.  Neck: Neck supple. No thyromegaly present.  Cardiovascular: Normal rate, regular rhythm, normal heart sounds and intact distal pulses.   No murmur heard. Pulmonary/Chest: Effort normal and breath sounds normal. No respiratory distress. She has no wheezes.  Musculoskeletal: Normal range of motion. She exhibits edema (2+ edema left leg, trace in right). She exhibits no tenderness.  Lymphadenopathy:    She has no cervical adenopathy.  Neurological: She is alert and oriented to person, place, and time. Coordination normal.  Skin: Skin is warm and dry. No rash noted. She is not diaphoretic.  Psychiatric: She has a normal mood and affect. Her behavior is normal.  Nursing note and vitals reviewed.     Assessment & Plan:   Problem List Items Addressed This Visit      Other   Lower extremity edema   Relevant Orders   CMP14+EGFR   Brain natriuretic peptide   DME Other see comment    Other Visit Diagnoses    Pain and swelling of left lower leg    -  Primary   Relevant Orders   CMP14+EGFR   Brain natriuretic peptide   US Venous Img Lower Unilateral Left (Completed)      Instructed patient not to  use the compression stockings until after the ultrasound to make sure there are no blood clots, if that is normal then she can go ahead and use the stockings  Follow up plan: Return if symptoms worsen or fail to improve.  Counseling provided for all of the vaccine components Orders Placed This Encounter  Procedures  . US Venous Img Lower Unilateral Left  . CMP14+EGFR  . Brain natriuretic peptide    Caryl Pina, MD [facility] 04/08/2017, 11:35 AM

## 2017-04-09 LAB — CMP14+EGFR
A/G RATIO: 1.4 (ref 1.2–2.2)
ALBUMIN: 4.1 g/dL (ref 3.5–4.8)
ALK PHOS: 106 IU/L (ref 39–117)
ALT: 11 IU/L (ref 0–32)
AST: 23 IU/L (ref 0–40)
BILIRUBIN TOTAL: 0.8 mg/dL (ref 0.0–1.2)
BUN / CREAT RATIO: 11 — AB (ref 12–28)
BUN: 13 mg/dL (ref 8–27)
CHLORIDE: 99 mmol/L (ref 96–106)
CO2: 27 mmol/L (ref 20–29)
Calcium: 9.8 mg/dL (ref 8.7–10.3)
Creatinine, Ser: 1.16 mg/dL — ABNORMAL HIGH (ref 0.57–1.00)
GFR calc Af Amer: 52 mL/min/{1.73_m2} — ABNORMAL LOW (ref >59)
GFR calc non Af Amer: 45 mL/min/{1.73_m2} — ABNORMAL LOW (ref >59)
GLUCOSE: 94 mg/dL (ref 65–99)
Globulin, Total: 3 g/dL (ref 1.5–4.5)
POTASSIUM: 4.1 mmol/L (ref 3.5–5.2)
SODIUM: 143 mmol/L (ref 134–144)
Total Protein: 7.1 g/dL (ref 6.0–8.5)

## 2017-04-09 LAB — BRAIN NATRIURETIC PEPTIDE: BNP: 132.7 pg/mL — AB (ref 0.0–100.0)

## 2017-04-11 NOTE — Addendum Note (Signed)
Addended by: Shelbie Ammons on: 04/11/2017 11:18 AM   Modules accepted: Orders

## 2017-04-13 ENCOUNTER — Ambulatory Visit (HOSPITAL_COMMUNITY)
Admission: RE | Admit: 2017-04-13 | Discharge: 2017-04-13 | Disposition: A | Discharge status: Home / Self Care | Payer: Medicare HMO | Source: Ambulatory Visit | Attending: Family Medicine | Admitting: Family Medicine

## 2017-04-13 DIAGNOSIS — R600 Localized edema: Secondary | ICD-10-CM | POA: Diagnosis not present

## 2017-04-13 DIAGNOSIS — I080 Rheumatic disorders of both mitral and aortic valves: Secondary | ICD-10-CM | POA: Insufficient documentation

## 2017-04-13 DIAGNOSIS — R7989 Other specified abnormal findings of blood chemistry: Secondary | ICD-10-CM

## 2017-04-13 DIAGNOSIS — M7989 Other specified soft tissue disorders: Secondary | ICD-10-CM

## 2017-04-13 DIAGNOSIS — M79662 Pain in left lower leg: Secondary | ICD-10-CM | POA: Diagnosis not present

## 2017-04-13 LAB — ECHOCARDIOGRAM COMPLETE
CHL CUP DOP CALC LVOT VTI: 19.7 cm
CHL CUP MV DEC (S): 370
CHL CUP STROKE VOLUME: 29 mL
E decel time: 370 msec
E/e' ratio: 12.31
FS: 33 % (ref 28–44)
IVS/LV PW RATIO, ED: 1.1
LA diam end sys: 31 mm
LA diam index: 2.05 cm/m2
LA vol: 45 mL
LASIZE: 31 mm
LAVOLA4C: 53.6 mL
LAVOLIN: 29.8 mL/m2
LV PW d: 9.07 mm — AB (ref 0.6–1.1)
LV SIMPSON'S DISK: 60
LV sys vol: 19 mL (ref 14–42)
LVDIAVOL: 48 mL (ref 46–106)
LVDIAVOLIN: 32 mL/m2
LVEEAVG: 12.31
LVEEMED: 12.31
LVELAT: 10.8 cm/s
LVOT area: 2.27 cm2
LVOT peak grad rest: 6 mmHg
LVOTD: 17 mm
LVOTPV: 127 cm/s
LVOTSV: 45 mL
LVSYSVOLIN: 13 mL/m2
Lateral S' vel: 13.5 cm/s
MV pk A vel: 152 m/s
MVPG: 7 mmHg
MVPKEVEL: 133 m/s
P 1/2 time: 384 ms
RV TAPSE: 15.3 mm
RV sys press: 29 mmHg
Reg peak vel: 256 cm/s
TDI e' lateral: 10.8
TDI e' medial: 8.05
TR max vel: 256 cm/s

## 2017-04-13 NOTE — Progress Notes (Signed)
*  PRELIMINARY RESULTS* Echocardiogram 2D Echocardiogram has been performed.  Samuel Germany 04/13/2017, 1:47 PM

## 2017-05-02 ENCOUNTER — Telehealth: Payer: Self-pay | Admitting: Pharmacist

## 2017-05-02 NOTE — Telephone Encounter (Signed)
Received notice from [employer] that there were unable to assess for assistance with Prolia because patient's insurance plicy has terminiated 04/16/17.  Called patient - she states that someone has come out to their home and insurance was switched to [employer] but that is has been switched again and [employer] will be active again 05/18/17.  Will send fax to [employer] asking them to hold off on verifying prolia price and assistance until 05/18/17

## 2017-05-18 ENCOUNTER — Other Ambulatory Visit: Payer: Self-pay | Admitting: Family Medicine

## 2017-05-19 NOTE — Telephone Encounter (Signed)
Next OV 05/25/17

## 2017-05-25 ENCOUNTER — Ambulatory Visit (INDEPENDENT_AMBULATORY_CARE_PROVIDER_SITE_OTHER): Payer: Medicare HMO | Admitting: Family Medicine

## 2017-05-25 VITALS — BP 112/75 | HR 98 | Temp 97.9°F | Ht 61.0 in | Wt 116.0 lb

## 2017-05-25 DIAGNOSIS — I10 Essential (primary) hypertension: Secondary | ICD-10-CM | POA: Diagnosis not present

## 2017-05-25 DIAGNOSIS — E559 Vitamin D deficiency, unspecified: Secondary | ICD-10-CM

## 2017-05-25 DIAGNOSIS — E7800 Pure hypercholesterolemia, unspecified: Secondary | ICD-10-CM | POA: Diagnosis not present

## 2017-05-25 DIAGNOSIS — R1310 Dysphagia, unspecified: Secondary | ICD-10-CM

## 2017-05-25 DIAGNOSIS — Z0000 Encounter for general adult medical examination without abnormal findings: Secondary | ICD-10-CM

## 2017-05-25 DIAGNOSIS — R1319 Other dysphagia: Secondary | ICD-10-CM

## 2017-05-26 ENCOUNTER — Other Ambulatory Visit: Payer: Medicare HMO

## 2017-05-26 ENCOUNTER — Encounter: Payer: Self-pay | Admitting: Family Medicine

## 2017-05-26 DIAGNOSIS — I10 Essential (primary) hypertension: Secondary | ICD-10-CM

## 2017-05-26 DIAGNOSIS — E559 Vitamin D deficiency, unspecified: Secondary | ICD-10-CM | POA: Diagnosis not present

## 2017-05-26 DIAGNOSIS — E7800 Pure hypercholesterolemia, unspecified: Secondary | ICD-10-CM

## 2017-05-26 DIAGNOSIS — Z0000 Encounter for general adult medical examination without abnormal findings: Secondary | ICD-10-CM | POA: Diagnosis not present

## 2017-05-26 MED ORDER — OXAZEPAM 15 MG PO CAPS: ORAL_CAPSULE | ORAL | 3 refills | Status: DC

## 2017-05-26 NOTE — Addendum Note (Signed)
Addended by: Zannie Cove on: 05/26/2017 10:59 AM   Modules accepted: Orders

## 2017-05-26 NOTE — Progress Notes (Signed)
Subjective:    Patient ID: [name], female    DOB: 1940-12-06, 76 y.o.   MRN: [id number]  HPI Pt here for follow up and management of chronic medical problems which includes hyperlipidemia and hypertension. She is taking medication regularly.This patient is doing well overall other than some occasional swallowing problems at times. She has had an esophageal dilatation and was told that the only way that this could be improved more would be with surgery and she absolutely refuses to think about any surgery. She does continue to have her anxiety issues. She denies any trouble with chest pain shortness of breath nausea vomiting diarrhea blood in the stool or black tarry bowel movements. She is passing her water without problems. She also does complain with occasional swelling in the left foot. She cannot get blood work done today because she is not fasting and will return in the morning for this.    Patient Active Problem List   Diagnosis Date Noted  . PAC (premature atrial contraction) 12/30/2015  . Essential hypertension 12/18/2015  . Lower extremity edema 12/18/2015  . Palpitations 12/18/2015  . Hyperlipemia 02/14/2013  . Hypothyroid 02/14/2013  . Vitamin D deficiency 02/14/2013  . Generalized anxiety disorder 02/14/2013  . Constipation   . Esophageal stricture   . Osteoporosis with pathological fracture   . GERD (gastroesophageal reflux disease)    Outpatient Encounter Prescriptions as of 05/25/2017  Medication Sig  . acetaminophen (TYLENOL) 500 MG tablet Take 500 mg by mouth every 8 (eight) hours as needed for mild pain or moderate pain.  Marland Kitchen atorvastatin (LIPITOR) 80 MG tablet TAKE 1 TABLET EVERY DAY  . Calcium-Phosphorus-Vitamin D ([place]) [phone] MG-MG-UNIT CHEW Chew 1 each by mouth 2 (two) times daily.  . Cholecalciferol (VITAMIN D3) 5000 UNITS TABS Take 1 tablet by mouth daily.    . hydrochlorothiazide (HYDRODIURIL) 25 MG tablet TAKE 1 TABLET EVERY DAY   . levothyroxine (SYNTHROID, LEVOTHROID) 100 MCG tablet Take 50 mcg by mouth See admin instructions. Takes Mon through Fri. DOES NOT TAKE ON SAT and SUN  . metoprolol tartrate (LOPRESSOR) 25 MG tablet Take 1 tablet (25 mg total) by mouth 2 (two) times daily.  . Multiple Vitamin (MULTIVITAMIN WITH MINERALS) TABS tablet Take 1 tablet by mouth daily.  Marland Kitchen oxazepam (SERAX) 15 MG capsule TAKE ONE (1) CAPSULE THREE (3) TIMES EACH DAY  . potassium chloride SA (K-DUR,KLOR-CON) 20 MEQ tablet Take 20 mEq by mouth every other day.  . sucralfate (CARAFATE) 1 g tablet TAKE 1 TABLET TWICE DAILY   Facility-Administered Encounter Medications as of 05/25/2017  Medication  . gi cocktail (Maalox,Lidocaine,Donnatal)      Review of Systems  Constitutional: Negative.   HENT: Negative.   Eyes: Negative.   Respiratory: Negative for shortness of breath.   Cardiovascular: Positive for leg swelling (feet).  Gastrointestinal: Negative.   Endocrine: Negative.   Genitourinary: Negative.   Musculoskeletal: Negative.   Skin: Negative.   Allergic/Immunologic: Negative.   Neurological: Negative.   Hematological: Negative.   Psychiatric/Behavioral: The patient is nervous/anxious (anxiety).        Objective:   Physical Exam  Constitutional: She is oriented to person, place, and time. No distress.  Small framed feisty and alert and appearing somewhat older than her stated age.  HENT:  Head: Normocephalic and atraumatic.  Right Ear: External ear normal.  Left Ear: External ear normal.  Nose: Nose normal.  Mouth/Throat: Oropharynx is clear and moist. No oropharyngeal exudate.  Eyes: Pupils  are equal, round, and reactive to light. Conjunctivae and EOM are normal. Right eye exhibits no discharge. Left eye exhibits no discharge. No scleral icterus.  Up-to-date on eye exam  Neck: Normal range of motion. Neck supple. No thyromegaly present.  Cardiovascular: Normal rate, regular rhythm, normal heart sounds and intact  distal pulses.   No murmur heard. The heart had a regular rate and rhythm at 96/m  Pulmonary/Chest: Effort normal and breath sounds normal. No respiratory distress. She has no wheezes. She has no rales.  Clear anteriorly and posteriorly  Abdominal: Soft. Bowel sounds are normal. She exhibits no mass. There is no tenderness. There is no rebound and no guarding.  Musculoskeletal: Normal range of motion. She exhibits edema.  Minimal edema left foot and ankle  Lymphadenopathy:    She has no cervical adenopathy.  Neurological: She is alert and oriented to person, place, and time. She has normal reflexes. No cranial nerve deficit.  Skin: Skin is warm and dry. No rash noted.  Psychiatric: She has a normal mood and affect. Her behavior is normal. Judgment and thought content normal.  Nursing note and vitals reviewed.  BP 112/75 (BP Location: Left Arm)   Pulse 98   Temp 97.9 F (36.6 C) (Oral)   Ht 5' 1"  (1.549 m)   Wt 116 lb (52.6 kg)   BMI 21.92 kg/m         Assessment & Plan:  1. Vitamin D deficiency -Continue current treatment pending results of lab work - CBC with Differential/Platelet; Future - VITAMIN D 25 Hydroxy (Vit-D Deficiency, Fractures); Future  2. Essential hypertension -The blood pressure is good today and she will continue with current treatment - BMP8+EGFR; Future - CBC with Differential/Platelet; Future - Hepatic function panel; Future  3. Pure hypercholesterolemia -Continue with atorvastatin pending results of lab work - CBC with Differential/Platelet; Future - Lipid panel; Future  4. Healthcare maintenance -Continue with current thyroid medicine pending results of lab work.  - Thyroid Panel With TSH; Future  5. Esophageal dysphagia -The patient says this is only occasional and she has had an endoscopy and was told that nothing else could be done other than surgery. She prefers to wait on any further follow-up pending any worsening in this condition. She  will continue with her Carafate.  Meds ordered this encounter  Medications  . oxazepam (SERAX) 15 MG capsule    Sig: TAKE ONE (1) CAPSULE THREE (3) TIMES EACH DAY    Dispense:  90 capsule    Refill:  3   Patient Instructions                       Medicare Annual Wellness Visit  [facility] and the medical providers at [facility] strive to bring you the best medical care.  In doing so we not only want to address your current medical conditions and concerns but also to detect new conditions early and prevent illness, disease and health-related problems.    Medicare offers a yearly Wellness Visit which allows our clinical staff to assess your need for preventative services including immunizations, lifestyle education, counseling to decrease risk of preventable diseases and screening for fall risk and other medical concerns.    This visit is provided free of charge (no copay) for all Medicare recipients. The clinical pharmacists at [facility] have begun to conduct these Wellness Visits which will also include a thorough review of all your medications.  As you primary medical provider recommend that you make an appointment for your Annual Wellness Visit if you have not done so already this year.  You may set up this appointment before you leave today or you may call back ([phone]) and schedule an appointment.  Please make sure when you call that you mention that you are scheduling your Annual Wellness Visit with the clinical pharmacist so that the appointment may be made for the proper length of time.     Continue current medications. Continue good therapeutic lifestyle changes which include good diet and exercise. Fall precautions discussed with patient. If an FOBT was given today- please return it to our front desk. If you are over 39 years old - you may need Prevnar 56 or the adult Pneumonia vaccine.  **Flu shots are available--- please call  and schedule a FLU-CLINIC appointment**  After your visit with [place] today you will receive a survey in the mail or online from [employer] regarding your care with [place]. Please take a moment to fill this out. Your feedback is very important to [place] as you can help [place] better understand your patient needs as well as improve your experience and satisfaction. WE CARE ABOUT YOU!!!   Continue with Carafate Call if any worsening problems swallowing Continue with current treatment regimen    Patient Instructions                       Medicare Annual Wellness Visit  [facility] and the medical providers at [facility] strive to bring you the best medical care.  In doing so we not only want to address your current medical conditions and concerns but also to detect new conditions early and prevent illness, disease and health-related problems.    Medicare offers a yearly Wellness Visit which allows our clinical staff to assess your need for preventative services including immunizations, lifestyle education, counseling to decrease risk of preventable diseases and screening for fall risk and other medical concerns.    This visit is provided free of charge (no copay) for all Medicare recipients. The clinical pharmacists at [facility] have begun to conduct these Wellness Visits which will also include a thorough review of all your medications.    As you primary medical provider recommend that you make an appointment for your Annual Wellness Visit if you have not done so already this year.  You may set up this appointment before you leave today or you may call back ([phone]) and schedule an appointment.  Please make sure when you call that you mention that you are scheduling your Annual Wellness Visit with the clinical pharmacist so that the appointment may be made for the proper length of time.     Continue current medications. Continue good therapeutic lifestyle  changes which include good diet and exercise. Fall precautions discussed with patient. If an FOBT was given today- please return it to our front desk. If you are over 34 years old - you may need Prevnar 97 or the adult Pneumonia vaccine.  **Flu shots are available--- please call and schedule a FLU-CLINIC appointment**  After your visit with [place] today you will receive a survey in the mail or online from [employer] regarding your care with [place]. Please take a moment to fill this out. Your feedback is very important to [place] as you can help [place] better understand your patient needs as well as improve your experience and satisfaction. WE  CARE ABOUT YOU!!!   Continue with Carafate Call if any worsening problems swallowing Continue with current treatment regimen    Arrie Senate MD

## 2017-05-26 NOTE — Patient Instructions (Addendum)
Medicare Annual Wellness Visit  [facility] and the medical providers at [facility] strive to bring you the best medical care.  In doing so we not only want to address your current medical conditions and concerns but also to detect new conditions early and prevent illness, disease and health-related problems.    Medicare offers a yearly Wellness Visit which allows our clinical staff to assess your need for preventative services including immunizations, lifestyle education, counseling to decrease risk of preventable diseases and screening for fall risk and other medical concerns.    This visit is provided free of charge (no copay) for all Medicare recipients. The clinical pharmacists at [facility] have begun to conduct these Wellness Visits which will also include a thorough review of all your medications.    As you primary medical provider recommend that you make an appointment for your Annual Wellness Visit if you have not done so already this year.  You may set up this appointment before you leave today or you may call back ([phone]) and schedule an appointment.  Please make sure when you call that you mention that you are scheduling your Annual Wellness Visit with the clinical pharmacist so that the appointment may be made for the proper length of time.     Continue current medications. Continue good therapeutic lifestyle changes which include good diet and exercise. Fall precautions discussed with patient. If an FOBT was given today- please return it to our front desk. If you are over 56 years old - you may need Prevnar 8 or the adult Pneumonia vaccine.  **Flu shots are available--- please call and schedule a FLU-CLINIC appointment**  After your visit with [place] today you will receive a survey in the mail or online from [employer] regarding your care with [place]. Please take a moment to fill this out. Your feedback is very  important to [place] as you can help [place] better understand your patient needs as well as improve your experience and satisfaction. WE CARE ABOUT YOU!!!   Continue with Carafate Call if any worsening problems swallowing Continue with current treatment regimen

## 2017-05-27 LAB — BMP8+EGFR
BUN / CREAT RATIO: 13 (ref 12–28)
BUN: 14 mg/dL (ref 8–27)
CHLORIDE: 102 mmol/L (ref 96–106)
CO2: 27 mmol/L (ref 20–29)
Calcium: 9.4 mg/dL (ref 8.7–10.3)
Creatinine, Ser: 1.1 mg/dL — ABNORMAL HIGH (ref 0.57–1.00)
GFR calc Af Amer: 56 mL/min/{1.73_m2} — ABNORMAL LOW (ref >59)
GFR calc non Af Amer: 48 mL/min/{1.73_m2} — ABNORMAL LOW (ref >59)
GLUCOSE: 91 mg/dL (ref 65–99)
Potassium: 3.7 mmol/L (ref 3.5–5.2)
SODIUM: 144 mmol/L (ref 134–144)

## 2017-05-27 LAB — HEPATIC FUNCTION PANEL
ALK PHOS: 90 IU/L (ref 39–117)
ALT: 8 IU/L (ref 0–32)
AST: 23 IU/L (ref 0–40)
Albumin: 4.1 g/dL (ref 3.5–4.8)
BILIRUBIN TOTAL: 0.5 mg/dL (ref 0.0–1.2)
Bilirubin, Direct: 0.15 mg/dL (ref 0.00–0.40)
Total Protein: 6.6 g/dL (ref 6.0–8.5)

## 2017-05-27 LAB — LIPID PANEL
Chol/HDL Ratio: 2.6 ratio (ref 0.0–4.4)
Cholesterol, Total: 129 mg/dL (ref 100–199)
HDL: 50 mg/dL (ref >39)
LDL CALC: 63 mg/dL (ref 0–99)
TRIGLYCERIDES: 80 mg/dL (ref 0–149)
VLDL Cholesterol Cal: 16 mg/dL (ref 5–40)

## 2017-05-27 LAB — CBC WITH DIFFERENTIAL/PLATELET
BASOS ABS: 0 10*3/uL (ref 0.0–0.2)
Basos: 1 %
EOS (ABSOLUTE): 0.2 10*3/uL (ref 0.0–0.4)
Eos: 5 %
HEMATOCRIT: 38.6 % (ref 34.0–46.6)
HEMOGLOBIN: 12.3 g/dL (ref 11.1–15.9)
Immature Grans (Abs): 0 10*3/uL (ref 0.0–0.1)
Immature Granulocytes: 0 %
LYMPHS ABS: 1.8 10*3/uL (ref 0.7–3.1)
Lymphs: 34 %
MCH: 29.2 pg (ref 26.6–33.0)
MCHC: 31.9 g/dL (ref 31.5–35.7)
MCV: 92 fL (ref 79–97)
MONOS ABS: 0.5 10*3/uL (ref 0.1–0.9)
Monocytes: 9 %
Neutrophils Absolute: 2.8 10*3/uL (ref 1.4–7.0)
Neutrophils: 51 %
Platelets: 274 10*3/uL (ref 150–379)
RBC: 4.21 x10E6/uL (ref 3.77–5.28)
RDW: 14.4 % (ref 12.3–15.4)
WBC: 5.3 10*3/uL (ref 3.4–10.8)

## 2017-05-27 LAB — VITAMIN D 25 HYDROXY (VIT D DEFICIENCY, FRACTURES): Vit D, 25-Hydroxy: 37.9 ng/mL (ref 30.0–100.0)

## 2017-05-27 LAB — THYROID PANEL WITH TSH
Free Thyroxine Index: 3.1 (ref 1.2–4.9)
T3 Uptake Ratio: 28 % (ref 24–39)
T4 TOTAL: 10.9 ug/dL (ref 4.5–12.0)
TSH: 0.608 u[IU]/mL (ref 0.450–4.500)

## 2017-06-01 ENCOUNTER — Telehealth: Payer: Self-pay | Admitting: Cardiovascular Disease

## 2017-06-01 DIAGNOSIS — L570 Actinic keratosis: Secondary | ICD-10-CM | POA: Diagnosis not present

## 2017-06-01 NOTE — Telephone Encounter (Signed)
OK no problem.

## 2017-06-01 NOTE — Telephone Encounter (Signed)
New message    Pt  Wants to switch from Dr Oval Linsey to Dr Percival Spanish so that she can be seen at the [facility] office

## 2017-06-02 NOTE — Telephone Encounter (Signed)
OK 

## 2017-06-03 NOTE — Telephone Encounter (Signed)
appt schedule with Dr Percival Spanish in [place] on 10/03 @2 :40 pm

## 2017-06-16 ENCOUNTER — Other Ambulatory Visit: Payer: Self-pay | Admitting: Family Medicine

## 2017-06-21 ENCOUNTER — Other Ambulatory Visit: Payer: Self-pay | Admitting: Cardiovascular Disease

## 2017-06-21 NOTE — Telephone Encounter (Signed)
Please review for refill, thanks ! 

## 2017-06-21 NOTE — Telephone Encounter (Signed)
Spoke with pharmacy and Lasix last filled in October 2017 Patient has been seen by PCP several times since seeing Dr Oval Linsey last After reviewing chart looks as though patient taking HCTZ instead of Lasix Refused Rx

## 2017-07-20 ENCOUNTER — Ambulatory Visit: Payer: Commercial Managed Care - HMO | Admitting: Cardiology

## 2017-08-01 ENCOUNTER — Other Ambulatory Visit: Payer: Self-pay | Admitting: Family Medicine

## 2017-08-02 ENCOUNTER — Ambulatory Visit (INDEPENDENT_AMBULATORY_CARE_PROVIDER_SITE_OTHER): Payer: Medicare HMO

## 2017-08-02 DIAGNOSIS — Z23 Encounter for immunization: Secondary | ICD-10-CM

## 2017-08-16 ENCOUNTER — Encounter: Payer: Self-pay | Admitting: *Deleted

## 2017-08-16 ENCOUNTER — Ambulatory Visit (INDEPENDENT_AMBULATORY_CARE_PROVIDER_SITE_OTHER): Payer: Medicare HMO | Admitting: *Deleted

## 2017-08-16 VITALS — BP 119/79 | HR 93 | Ht 60.0 in | Wt 117.0 lb

## 2017-08-16 DIAGNOSIS — Z0000 Encounter for general adult medical examination without abnormal findings: Secondary | ICD-10-CM

## 2017-08-18 ENCOUNTER — Telehealth: Payer: Self-pay | Admitting: Family Medicine

## 2017-08-18 NOTE — Telephone Encounter (Signed)
Having some abd pain - - she states she is ok - will call back tomorrow if she thinks she should be seen

## 2017-08-19 ENCOUNTER — Ambulatory Visit (INDEPENDENT_AMBULATORY_CARE_PROVIDER_SITE_OTHER): Payer: Medicare HMO

## 2017-08-19 ENCOUNTER — Ambulatory Visit (INDEPENDENT_AMBULATORY_CARE_PROVIDER_SITE_OTHER): Payer: Medicare HMO | Admitting: Pediatrics

## 2017-08-19 ENCOUNTER — Encounter: Payer: Self-pay | Admitting: Pediatrics

## 2017-08-19 VITALS — BP 138/83 | HR 90 | Temp 97.6°F | Ht 60.0 in | Wt 116.6 lb

## 2017-08-19 DIAGNOSIS — M8000XD Age-related osteoporosis with current pathological fracture, unspecified site, subsequent encounter for fracture with routine healing: Secondary | ICD-10-CM

## 2017-08-19 DIAGNOSIS — R0781 Pleurodynia: Secondary | ICD-10-CM

## 2017-08-19 DIAGNOSIS — N390 Urinary tract infection, site not specified: Secondary | ICD-10-CM

## 2017-08-19 DIAGNOSIS — R062 Wheezing: Secondary | ICD-10-CM

## 2017-08-19 DIAGNOSIS — R0789 Other chest pain: Secondary | ICD-10-CM

## 2017-08-19 DIAGNOSIS — R079 Chest pain, unspecified: Secondary | ICD-10-CM | POA: Diagnosis not present

## 2017-08-19 DIAGNOSIS — R109 Unspecified abdominal pain: Secondary | ICD-10-CM

## 2017-08-19 LAB — URINALYSIS, COMPLETE
BILIRUBIN UA: NEGATIVE
Glucose, UA: NEGATIVE
KETONES UA: NEGATIVE
Leukocytes, UA: NEGATIVE
NITRITE UA: NEGATIVE
PH UA: 7.5 (ref 5.0–7.5)
Protein, UA: NEGATIVE
RBC UA: NEGATIVE
SPEC GRAV UA: 1.015 (ref 1.005–1.030)
Urobilinogen, Ur: 0.2 mg/dL (ref 0.2–1.0)

## 2017-08-19 LAB — MICROSCOPIC EXAMINATION
Bacteria, UA: NONE SEEN
EPITHELIAL CELLS (NON RENAL): NONE SEEN /HPF (ref 0–10)
Renal Epithel, UA: NONE SEEN /hpf

## 2017-08-19 MED ORDER — ALBUTEROL SULFATE HFA 108 (90 BASE) MCG/ACT IN AERS: 2.0000 | INHALATION_SPRAY | Freq: Four times a day (QID) | RESPIRATORY_TRACT | 0 refills | Status: DC | PRN

## 2017-08-19 NOTE — Progress Notes (Signed)
  Subjective:   Patient ID: [name], female    DOB: [birth year], 76 y.o.   MRN: [id number] CC: Abdominal Pain and Back Pain  HPI: [name] is a 76 y.o. female presenting for Abdominal Pain and Back Pain  Here today with her husband  Started having pain yesterday in back R side, moved around to her stomach Improved now, no pain No falls Has been more active recently No dysuria No fevers Appetite has been ok  Coughing some off and on Has not recently had breathing problems Non-productive  Osteoporosis: not on treatment now Intolerance to bisphosphonates  Relevant past medical, surgical, family and social history reviewed. Allergies and medications reviewed and updated. Social History   Tobacco Use  Smoking Status Former Smoker  Smokeless Tobacco Never Used  Tobacco Comment   Non-smoker   ROS: Per HPI   Objective:    BP 138/83   Pulse 90   Temp 97.6 F (36.4 C) (Oral)   Ht 5' (1.524 m)   Wt 116 lb 9.6 oz (52.9 kg)   BMI 22.77 kg/m   Wt Readings from Last 3 Encounters:  08/19/17 116 lb 9.6 oz (52.9 kg)  08/16/17 117 lb (53.1 kg)  05/26/17 116 lb (52.6 kg)    Gen: NAD, alert, cooperative with exam, NCAT EYES: EOMI, no conjunctival injection, or no icterus ENT:  TMs pearly gray b/l, OP without erythema LYMPH: no cervical LAD CV: NRRR, normal S1/S2, no murmur, distal pulses 2+ b/l Resp: slight expiratory wheeze end of exhalation b/l. No crackles. normal WOB Abd: +BS, soft, NTND. no guarding or organomegaly, no CVA tenderness Ext: No edema, warm Neuro: Alert and oriented, strength equal b/l UE and LE, coordination grossly normal MSK: no point tenderness over spine, R side of ribs  Assessment & Plan:  Oralia was seen today for abdominal pain and back pain.  Diagnoses and all orders for this visit:  Rib pain No point tenderness, better now Coughing may have been contributing -     DG Chest 2 View; Future  Osteoporosis with pathological  fracture with routine healing, subsequent encounter Intolerance to bisphosphonates, open to starting prolia Will start process to see if insurance covers it, note sent to [facility]  Abdominal pain, unspecified abdominal location F/u culture -     Urinalysis, Complete -     Urine Culture  Wheezing Use below TID for next few days -     albuterol (PROVENTIL HFA;VENTOLIN HFA) 108 (90 Base) MCG/ACT inhaler; Inhale 2 puffs into the lungs every 6 (six) hours as needed for wheezing or shortness of breath.  Other orders -     Microscopic Examination   Follow up plan: Return in about 4 weeks (around 09/16/2017). Assunta Found, MD [facility]  ADDENDUM: Urine culture +, will treat with amox per sensitivities and allergies

## 2017-08-21 LAB — URINE CULTURE

## 2017-08-22 MED ORDER — AMOXICILLIN 500 MG PO CAPS: 500.0000 mg | ORAL_CAPSULE | Freq: Two times a day (BID) | ORAL | 0 refills | Status: DC

## 2017-08-24 NOTE — Progress Notes (Deleted)
Cardiology Office Note   Date:  08/24/2017   ID:  [name], DOB [birth year], MRN [id number]  PCP:  Chipper Herb, MD  Cardiologist:   Minus Breeding, MD    No chief complaint on file.     History of Present Illness: [name] is a 76 y.o. female who presents for evaluation of palpitations.  She presented for evaluation of these last year and was seen by Dr. Oval Linsey but she lives in [place].  She has had some lower extremity edema.  ***  Interval History 12/30/15: At her last appointment HCTZ was stopped and she was started on lisinopril and lasix due to lower extremity edema.  It was also recommended that she wear compression stockings. Ms. Neidert has been doing very well.  She had a good response to lasix and notes that her edema has improved.  She tries to wear compression stockings but they are too tight.  She continues to have palpitations once or twice per month.  They only last for a few moments and are not associated with chest pain, shortness of breath, lightheadedness or dizziness.  She denies orthopnea or PND.    Hi   Past Medical History:  Diagnosis Date  . Anxiety states   . Breast cyst   . Cancer ([place])    skin  . Cataract   . Constipation   . Esophageal stricture   . Essential hypertension 12/18/2015  . Fracture, rib   . GERD (gastroesophageal reflux disease)   . Hiatal hernia   . Lower extremity edema 12/18/2015  . Osteoporosis   . Other and unspecified hyperlipidemia   . PAC (premature atrial contraction) 12/30/2015  . Palpitations 12/18/2015  . Unspecified hypothyroidism   . Vertebral fracture, osteoporotic [facility])     Past Surgical History:  Procedure Laterality Date  . BREAST BIOPSY    . CATARACT EXTRACTION    . EYE SURGERY    . HAND SURGERY    . IR GENERIC HISTORICAL  12/10/2016   IR RADIOLOGIST EVAL & MGMT 12/10/2016 [facility]INTERV RAD  . IR GENERIC HISTORICAL  12/23/2016   IR KYPHO THORACIC WITH BONE BIOPSY 12/23/2016 Luanne Bras, MD  [facility]-INTERV RAD  . PARTIAL HYSTERECTOMY    . THYROID LOBECTOMY     right  . VERTEBROPLASTY       Current Outpatient Medications  Medication Sig Dispense Refill  . acetaminophen (TYLENOL) 500 MG tablet Take 500 mg by mouth every 8 (eight) hours as needed for mild pain or moderate pain.    Marland Kitchen albuterol (PROVENTIL HFA;VENTOLIN HFA) 108 (90 Base) MCG/ACT inhaler Inhale 2 puffs into the lungs every 6 (six) hours as needed for wheezing or shortness of breath. 1 Inhaler 0  . amoxicillin (AMOXIL) 500 MG capsule Take 1 capsule (500 mg total) 2 (two) times daily by mouth. 14 capsule 0  . atorvastatin (LIPITOR) 80 MG tablet TAKE 1 TABLET EVERY DAY 90 tablet 1  . Calcium-Phosphorus-Vitamin D ([place]) [phone] MG-MG-UNIT CHEW Chew 1 each by mouth 2 (two) times daily.    . cetirizine (ZYRTEC) 10 MG tablet Take 10 mg by mouth daily.    . Cholecalciferol (VITAMIN D3) 2000 units CHEW Take 1 tablet by mouth daily.      . furosemide (LASIX) 20 MG tablet Take 20 mg by mouth.    . hydrochlorothiazide (HYDRODIURIL) 25 MG tablet TAKE 1 TABLET EVERY DAY 90 tablet 1  . levothyroxine (SYNTHROID, LEVOTHROID) 100 MCG tablet Take 50 mcg  by mouth See admin instructions. Takes Mon through Fri. DOES NOT TAKE ON SAT and SUN    . metoprolol tartrate (LOPRESSOR) 25 MG tablet Take 1 tablet (25 mg total) by mouth 2 (two) times daily. 180 tablet 0  . Multiple Vitamin (MULTIVITAMIN WITH MINERALS) TABS tablet Take 1 tablet by mouth daily.    Marland Kitchen oxazepam (SERAX) 15 MG capsule TAKE ONE (1) CAPSULE THREE (3) TIMES EACH DAY 90 capsule 3  . polyethylene glycol (MIRALAX / GLYCOLAX) packet Take 17 g by mouth daily.    . potassium chloride SA (K-DUR,KLOR-CON) 20 MEQ tablet Take 20 mEq by mouth every other day.    . sucralfate (CARAFATE) 1 g tablet TAKE 1 TABLET TWICE DAILY 180 tablet 1   No current facility-administered medications for this visit.     Allergies:   Bisphosphonates; Ciprofloxacin; Codeine; and Sulfa  antibiotics     ROS:  Please see the history of present illness.   Otherwise, review of systems are positive for {NONE DEFAULTED:18576::"none"}.   All other systems are reviewed and negative.    PHYSICAL EXAM: VS:  There were no vitals taken for this visit. , BMI There is no height or weight on file to calculate BMI. GENERAL:  Well appearing HEENT:  Pupils equal round and reactive, fundi not visualized, oral mucosa unremarkable NECK:  No jugular venous distention, waveform within normal limits, carotid upstroke brisk and symmetric, no bruits, no thyromegaly LYMPHATICS:  No cervical, inguinal adenopathy LUNGS:  Clear to auscultation bilaterally BACK:  No CVA tenderness CHEST:  Unremarkable HEART:  PMI not displaced or sustained,S1 and S2 within normal limits, no S3, no S4, no clicks, no rubs, *** murmurs ABD:  Flat, positive bowel sounds normal in frequency in pitch, no bruits, no rebound, no guarding, no midline pulsatile mass, no hepatomegaly, no splenomegaly EXT:  2 plus pulses throughout, no edema, no cyanosis no clubbing SKIN:  No rashes no nodules NEURO:  Cranial nerves II through XII grossly intact, motor grossly intact throughout PSYCH:  Cognitively intact, oriented to person place and time    EKG:  EKG {ACTION; IS/IS WNI:62703500} ordered today. The ekg ordered today demonstrates ***   Recent Labs: 04/08/2017: BNP 132.7 05/26/2017: ALT 8; BUN 14; Creatinine, Ser 1.10; Hemoglobin 12.3; Platelets 274; Potassium 3.7; Sodium 144; TSH 0.608    Lipid Panel    Component Value Date/Time   CHOL 129 05/26/2017 1007   CHOL 122 07/23/2013 0858   TRIG 80 05/26/2017 1007   TRIG 116 08/06/2015 1221   TRIG 89 07/23/2013 0858   HDL 50 05/26/2017 1007   HDL 56 08/06/2015 1221   HDL 47 07/23/2013 0858   CHOLHDL 2.6 05/26/2017 1007   LDLCALC 63 05/26/2017 1007   LDLCALC 63 07/17/2014 0929   LDLCALC 57 07/23/2013 0858      Wt Readings from Last 3 Encounters:  08/19/17 116 lb 9.6  oz (52.9 kg)  08/16/17 117 lb (53.1 kg)  05/26/17 116 lb (52.6 kg)      Other studies Reviewed: Additional studies/ records that were reviewed today include: ***. Review of the above records demonstrates:  Please see elsewhere in the note.  ***   ASSESSMENT AND PLAN:  *** HTN:  ***  EDEMA:  ***  PAC/PALPITATIONS:  ***  DYSLIPIDEMIA:  ***  Current medicines are reviewed at length with the patient today.  The patient {ACTIONS; HAS/DOES NOT HAVE:19233} concerns regarding medicines.  The following changes have been made:  {PLAN; NO CHANGE:13088:s}  Labs/  tests ordered today include: *** No orders of the defined types were placed in this encounter.    Disposition:   FU with ***    Signed, Minus Breeding, MD  08/24/2017 10:18 PM    [facility]

## 2017-08-25 ENCOUNTER — Encounter: Payer: Self-pay | Admitting: *Deleted

## 2017-08-25 ENCOUNTER — Ambulatory Visit: Payer: Medicare HMO | Admitting: Cardiology

## 2017-08-29 ENCOUNTER — Telehealth: Payer: Self-pay | Admitting: Pediatrics

## 2017-08-29 DIAGNOSIS — M810 Age-related osteoporosis without current pathological fracture: Secondary | ICD-10-CM

## 2017-08-29 NOTE — Telephone Encounter (Signed)
Patient notified. She would like for it to be after the first of the year due to co pay. Advised patient that I would put a note in chart.

## 2017-08-29 NOTE — Telephone Encounter (Signed)
Referral for osteoporosis treatment placed, intolerant of bisphosphonates

## 2017-08-30 ENCOUNTER — Other Ambulatory Visit: Payer: Self-pay | Admitting: Family Medicine

## 2017-08-31 DIAGNOSIS — Z0100 Encounter for examination of eyes and vision without abnormal findings: Secondary | ICD-10-CM | POA: Diagnosis not present

## 2017-08-31 DIAGNOSIS — H524 Presbyopia: Secondary | ICD-10-CM | POA: Diagnosis not present

## 2017-08-31 NOTE — Progress Notes (Signed)
Subjective:   [name] is a 76 y.o. female who presents for a subsequent Medicare Annual Wellness Visit. [name] is accompanied by her husband who is here for a wellness exam as well. They live in an apartment and do not have any children.   Review of Systems    Health is about the same as last year.   Cardiac Risk Factors include: advanced age (>57men, >54 women);dyslipidemia;hypertension;family history of premature cardiovascular disease;sedentary lifestyle    Other systems negative today.  Objective:    Today's Vitals   08/16/17 1534  BP: 119/79  Pulse: 93  Weight: 117 lb (53.1 kg)  Height: 5' (1.524 m)   Body mass index is 22.85 kg/m.   Current Medications (verified) Outpatient Encounter Medications as of 08/16/2017  Medication Sig  . acetaminophen (TYLENOL) 500 MG tablet Take 500 mg by mouth every 8 (eight) hours as needed for mild pain or moderate pain.  Marland Kitchen atorvastatin (LIPITOR) 80 MG tablet TAKE 1 TABLET EVERY DAY  . Calcium-Phosphorus-Vitamin D ([place]) [phone] MG-MG-UNIT CHEW Chew 1 each by mouth 2 (two) times daily.  . cetirizine (ZYRTEC) 10 MG tablet Take 10 mg by mouth daily.  . Cholecalciferol (VITAMIN D3) 2000 units CHEW Take 1 tablet by mouth daily.    . furosemide (LASIX) 20 MG tablet Take 20 mg by mouth.  . hydrochlorothiazide (HYDRODIURIL) 25 MG tablet TAKE 1 TABLET EVERY DAY  . levothyroxine (SYNTHROID, LEVOTHROID) 100 MCG tablet Take 50 mcg by mouth See admin instructions. Takes Mon through Fri. DOES NOT TAKE ON SAT and SUN  . metoprolol tartrate (LOPRESSOR) 25 MG tablet Take 1 tablet (25 mg total) by mouth 2 (two) times daily.  . Multiple Vitamin (MULTIVITAMIN WITH MINERALS) TABS tablet Take 1 tablet by mouth daily.  Marland Kitchen oxazepam (SERAX) 15 MG capsule TAKE ONE (1) CAPSULE THREE (3) TIMES EACH DAY  . polyethylene glycol (MIRALAX / GLYCOLAX) packet Take 17 g by mouth daily.  . potassium chloride SA (K-DUR,KLOR-CON) 20 MEQ  tablet Take 20 mEq by mouth every other day.  . sucralfate (CARAFATE) 1 g tablet TAKE 1 TABLET TWICE DAILY  . [DISCONTINUED] gi cocktail (Maalox,Lidocaine,Donnatal)    No facility-administered encounter medications on file as of 08/16/2017.     Allergies (verified) Bisphosphonates; Ciprofloxacin; Codeine; and Sulfa antibiotics   History: Past Medical History:  Diagnosis Date  . Anxiety states   . Breast cyst   . Cancer ([place])    skin  . Cataract   . Constipation   . Esophageal stricture   . Essential hypertension 12/18/2015  . Fracture, rib   . GERD (gastroesophageal reflux disease)   . Hiatal hernia   . Lower extremity edema 12/18/2015  . Osteoporosis   . Other and unspecified hyperlipidemia   . PAC (premature atrial contraction) 12/30/2015  . Palpitations 12/18/2015  . Unspecified hypothyroidism   . Vertebral fracture, osteoporotic [facility])    Past Surgical History:  Procedure Laterality Date  . BREAST BIOPSY    . CATARACT EXTRACTION    . EYE SURGERY    . HAND SURGERY    . IR GENERIC HISTORICAL  12/10/2016   IR RADIOLOGIST EVAL & MGMT 12/10/2016 [facility]INTER[facility]  . IR GENERIC HISTORICAL  12/23/2016   IR KYPHO THORACIC WITH BONE BIOPSY 12/23/2016 Luanne Bras, MD [facility]INTER[facility] RAD  . PARTIAL HYSTERECTOMY    . THYROID LOBECTOMY     right  . VERTEBROPLASTY     Family History  Problem Relation Age of  Onset  . Goiter Father   . Alcohol abuse Father   . Heart disease Sister   . Cancer Sister        breast and skin  . Hernia Brother   . Cancer Brother        throat  . Cancer Brother   . Heart disease Brother   . Early death Brother        auto accident   Social History   Occupational History  . Not on file  Tobacco Use  . Smoking status: Former Research scientist (life sciences)  . Smokeless tobacco: Never Used  . Tobacco comment: Non-smoker  Substance and Sexual Activity  . Alcohol use: No  . Drug use: No  . Sexual activity: No    Tobacco Counseling Counseling given: Not Answered Comment:  Non-smoker   Activities of Daily Living In your present state of health, do you have any difficulty performing the following activities: 08/16/2017 12/23/2016  Hearing? Y Y  Comment Decreased hearing in left ear -  Vision? N N  Difficulty concentrating or making decisions? N N  Walking or climbing stairs? N N  Dressing or bathing? N N  Doing errands, shopping? N -  Preparing Food and eating ? N -  Using the Toilet? N -  In the past six months, have you accidently leaked urine? N -  Do you have problems with loss of bowel control? N -  Managing your Medications? N -  Managing your Finances? N -  Housekeeping or managing your Housekeeping? N -  Some recent data might be hidden    Immunizations and Health Maintenance Immunization History  Administered Date(s) Administered  . Influenza Whole 06/18/2010  . Influenza, High Dose Seasonal PF 08/19/2016, 08/02/2017  . Influenza,inj,Quad PF,6+ Mos 07/23/2013, 07/17/2014, 08/06/2015  . Pneumococcal Conjugate-13 11/05/2013  . Pneumococcal Polysaccharide-23 10/18/2004  . Td 08/18/2010   There are no preventive care reminders to display for this patient.  Patient Care Team: Chipper Herb, MD as PCP - General (Family Medicine) Minus Breeding, MD (Cardiology) Clarene Essex, MD (Gastroenterology) Skeet Latch, MD as Attending Physician (Cardiology) Sandford Craze, MD as Referring Physician (Dermatology)  No hospitalizations or surgeries this past year.  Patient was seen in the ED for chest pain and GERD in 01/2017.     Assessment:   This is a routine wellness examination for [name].   Hearing/Vision screen No deficits noted during visit.   Dietary issues and exercise activities discussed: Current Exercise Habits: Structured exercise class, Type of exercise: strength training/weights;walking, Time (Minutes): 30, Frequency (Times/Week): 2, Weekly Exercise (Minutes/Week): 60  Goals Exercise for at least 150 minutes a  week  Depression Screen PHQ 2/9 Scores 08/19/2017 08/16/2017 05/26/2017 04/08/2017 01/12/2017 11/10/2016 11/05/2016  PHQ - 2 Score 0 0 0 0 0 0 0    Fall Risk Fall Risk  08/19/2017 08/16/2017 05/26/2017 04/08/2017 01/12/2017  Falls in the past year? No No No No No  Number falls in past yr: - - - - -  Injury with Fall? - - - - -  Risk Factor Category  - - - - -  Follow up - - - - -    Cognitive Function: MMSE - Mini Mental State Exam 08/16/2017 05/28/2015  Orientation to time 5 5  Orientation to Place 5 5  Registration 3 3  Attention/ Calculation 3 5  Recall 3 3  Language- name 2 objects 2 2  Language- repeat 1 1  Language- follow 3 step command 3 2  Language- read & follow direction 1 1  Write a sentence 1 1  Copy design 1 1  Total score 28 29        Screening Tests Health Maintenance  Topic Date Due  . MAMMOGRAM  12/24/2017 (Originally 01/15/2017)  . DEXA SCAN  02/02/2019  . TETANUS/TDAP  08/18/2020  . INFLUENZA VACCINE  Completed  . PNA vac Low Risk Adult  Completed      Plan:  Exercise for at least 150 minutes a week Keep f/u with PCP Move carefully to avoid falls.   I have personally reviewed and noted the following in the patient's chart:   . Medical and social history . Use of alcohol, tobacco or illicit drugs  . Current medications and supplements . Functional ability and status . Nutritional status . Physical activity . Advanced directives . List of other physicians . Hospitalizations, surgeries, and ER visits in previous 12 months . Vitals . Screenings to include cognitive, depression, and falls . Referrals and appointments  In addition, I have reviewed and discussed with patient certain preventive protocols, quality metrics, and best practice recommendations. A written personalized care plan for preventive services as well as general preventive health recommendations were provided to patient.    Chong Sicilian, RN   08/17/2017   I have reviewed and agree  with the above AWV documentation.   Mary-Margaret Hassell Done, FNP

## 2017-09-16 ENCOUNTER — Other Ambulatory Visit: Payer: Self-pay | Admitting: Pediatrics

## 2017-09-16 DIAGNOSIS — M810 Age-related osteoporosis without current pathological fracture: Secondary | ICD-10-CM

## 2017-09-19 ENCOUNTER — Telehealth: Payer: Self-pay | Admitting: *Deleted

## 2017-09-19 NOTE — Telephone Encounter (Signed)
Patient aware that she will need to have labs drawn before  seeing Dr. Hal Morales for osteoporosis

## 2017-09-28 ENCOUNTER — Ambulatory Visit: Payer: Medicare HMO | Admitting: Family Medicine

## 2017-09-28 ENCOUNTER — Encounter: Payer: Self-pay | Admitting: Family Medicine

## 2017-09-28 VITALS — BP 123/78 | HR 96 | Temp 97.8°F | Ht 60.0 in | Wt 116.0 lb

## 2017-09-28 DIAGNOSIS — E034 Atrophy of thyroid (acquired): Secondary | ICD-10-CM

## 2017-09-28 DIAGNOSIS — E039 Hypothyroidism, unspecified: Secondary | ICD-10-CM | POA: Diagnosis not present

## 2017-09-28 DIAGNOSIS — E7800 Pure hypercholesterolemia, unspecified: Secondary | ICD-10-CM | POA: Diagnosis not present

## 2017-09-28 DIAGNOSIS — E559 Vitamin D deficiency, unspecified: Secondary | ICD-10-CM | POA: Diagnosis not present

## 2017-09-28 DIAGNOSIS — I10 Essential (primary) hypertension: Secondary | ICD-10-CM | POA: Diagnosis not present

## 2017-09-28 DIAGNOSIS — F411 Generalized anxiety disorder: Secondary | ICD-10-CM | POA: Diagnosis not present

## 2017-09-28 DIAGNOSIS — M810 Age-related osteoporosis without current pathological fracture: Secondary | ICD-10-CM | POA: Diagnosis not present

## 2017-09-28 MED ORDER — OXAZEPAM 15 MG PO CAPS: ORAL_CAPSULE | ORAL | 0 refills | Status: DC

## 2017-09-28 NOTE — Patient Instructions (Addendum)
Medicare Annual Wellness Visit  [facility] and the medical providers at [facility] strive to bring you the best medical care.  In doing so we not only want to address your current medical conditions and concerns but also to detect new conditions early and prevent illness, disease and health-related problems.    Medicare offers a yearly Wellness Visit which allows our clinical staff to assess your need for preventative services including immunizations, lifestyle education, counseling to decrease risk of preventable diseases and screening for fall risk and other medical concerns.    This visit is provided free of charge (no copay) for all Medicare recipients. The clinical pharmacists at [facility] have begun to conduct these Wellness Visits which will also include a thorough review of all your medications.    As you primary medical provider recommend that you make an appointment for your Annual Wellness Visit if you have not done so already this year.  You may set up this appointment before you leave today or you may call back ([phone]) and schedule an appointment.  Please make sure when you call that you mention that you are scheduling your Annual Wellness Visit with the clinical pharmacist so that the appointment may be made for the proper length of time.     Continue current medications. Continue good therapeutic lifestyle changes which include good diet and exercise. Fall precautions discussed with patient. If an FOBT was given today- please return it to our front desk. If you are over 73 years old - you may need Prevnar 36 or the adult Pneumonia vaccine.  **Flu shots are available--- please call and schedule a FLU-CLINIC appointment**  After your visit with [place] today you will receive a survey in the mail or online from [employer] regarding your care with [place]. Please take a moment to fill this out. Your feedback is very  important to [place] as you can help [place] better understand your patient needs as well as improve your experience and satisfaction. WE CARE ABOUT YOU!!!   Continue with current treatment and we will call with lab work results as soon as they become available Take Mucinex maximum strength 1 twice daily as needed for cough and congestion Drink plenty of fluids and stay well-hydrated Avoid being outside especially in the colder weather and always be careful not to put yourself at risk for falling Keep the house as cool as possible and continue to use cool mist humidification

## 2017-09-28 NOTE — Progress Notes (Signed)
Subjective:    Patient ID: [name], female    DOB: Jul 26, 1941, 76 y.o.   MRN: [id number]  HPI Pt here for follow up and management of chronic medical problems which includes hyperlipidemia and hypothyroid. She is taking medication regularly.  The patient today complains of being sleepy and tired.  She is requesting a refill on her nerve medicine.  She is also due to return and FOBT and will get her lab work done.  Her vital signs are stable and her weight is down 1 pound from previous visit.  The patient is alert and doing well.  She has ongoing back pain and still has some trouble with swallowing but no worse than usual.  She denies any chest pain or shortness of breath.  She is also getting over an upper respiratory infection.  She has not seen any blood in the stool or had any black tarry bowel movements and is not having any nausea or vomiting or diarrhea.  She is having no trouble passing her water.  We have had trouble regulating her thyroid and will make sure that we get another thyroid profile day because of her fatigue and tiredness.     Patient Active Problem List   Diagnosis Date Noted  . PAC (premature atrial contraction) 12/30/2015  . Essential hypertension 12/18/2015  . Lower extremity edema 12/18/2015  . Palpitations 12/18/2015  . Hyperlipemia 02/14/2013  . Hypothyroid 02/14/2013  . Vitamin D deficiency 02/14/2013  . Generalized anxiety disorder 02/14/2013  . Constipation   . Esophageal stricture   . Osteoporosis with pathological fracture   . GERD (gastroesophageal reflux disease)    Outpatient Encounter Medications as of 09/28/2017  Medication Sig  . acetaminophen (TYLENOL) 500 MG tablet Take 500 mg by mouth every 8 (eight) hours as needed for mild pain or moderate pain.  Marland Kitchen albuterol (PROVENTIL HFA;VENTOLIN HFA) 108 (90 Base) MCG/ACT inhaler Inhale 2 puffs into the lungs every 6 (six) hours as needed for wheezing or shortness of breath.  Marland Kitchen atorvastatin  (LIPITOR) 80 MG tablet TAKE 1 TABLET EVERY DAY  . Calcium-Phosphorus-Vitamin D ([place]) [phone] MG-MG-UNIT CHEW Chew 1 each by mouth 2 (two) times daily.  . cetirizine (ZYRTEC) 10 MG tablet Take 10 mg by mouth daily.  . Cholecalciferol (VITAMIN D3) 2000 units CHEW Take 1 tablet by mouth daily.    . furosemide (LASIX) 20 MG tablet Take 20 mg by mouth.  . hydrochlorothiazide (HYDRODIURIL) 25 MG tablet TAKE 1 TABLET EVERY DAY  . levothyroxine (SYNTHROID, LEVOTHROID) 100 MCG tablet Take 50 mcg by mouth See admin instructions. Takes Mon through Fri. DOES NOT TAKE ON SAT and SUN  . metoprolol tartrate (LOPRESSOR) 25 MG tablet Take 1 tablet (25 mg total) by mouth 2 (two) times daily.  . Multiple Vitamin (MULTIVITAMIN WITH MINERALS) TABS tablet Take 1 tablet by mouth daily.  Marland Kitchen oxazepam (SERAX) 15 MG capsule TAKE ONE (1) CAPSULE THREE (3) TIMES EACH DAY  . polyethylene glycol (MIRALAX / GLYCOLAX) packet Take 17 g by mouth daily.  . potassium chloride SA (K-DUR,KLOR-CON) 20 MEQ tablet Take 20 mEq by mouth every other day.  . sucralfate (CARAFATE) 1 g tablet TAKE 1 TABLET TWICE DAILY  . [DISCONTINUED] amoxicillin (AMOXIL) 500 MG capsule Take 1 capsule (500 mg total) 2 (two) times daily by mouth.  . [DISCONTINUED] levothyroxine (SYNTHROID, LEVOTHROID) 100 MCG tablet Take 50 mcg by mouth See admin instructions. Takes Mon through Fri. DOES NOT TAKE ON SAT and SUN  No facility-administered encounter medications on file as of 09/28/2017.       Review of Systems  Constitutional: Positive for fatigue.  HENT: Negative.   Eyes: Negative.   Respiratory: Negative.   Cardiovascular: Negative.   Gastrointestinal: Negative.   Endocrine: Negative.   Genitourinary: Negative.   Musculoskeletal: Negative.   Skin: Negative.   Allergic/Immunologic: Negative.   Neurological: Negative.   Hematological: Negative.   Psychiatric/Behavioral: Negative.        Objective:   Physical Exam    Constitutional: She is oriented to person, place, and time. She appears well-developed and well-nourished. No distress.  Small framed and alert  HENT:  Head: Normocephalic and atraumatic.  Right Ear: External ear normal.  Left Ear: External ear normal.  Nose: Nose normal.  Mouth/Throat: Oropharynx is clear and moist. No oropharyngeal exudate.  Eyes: Conjunctivae and EOM are normal. Pupils are equal, round, and reactive to light. Right eye exhibits no discharge. Left eye exhibits no discharge. No scleral icterus.  Neck: Normal range of motion. Neck supple. No thyromegaly present.  No bruits thyromegaly or anterior cervical adenopathy  Cardiovascular: Normal rate, regular rhythm, normal heart sounds and intact distal pulses.  No murmur heard. Heart has a regular rate and rhythm at 72/min with good pedal pulses  Pulmonary/Chest: Effort normal and breath sounds normal. No respiratory distress. She has no wheezes. She has no rales.  Clear anteriorly and posteriorly  Abdominal: Soft. Bowel sounds are normal. She exhibits no mass. There is no tenderness. There is no rebound and no guarding.  Minimal epigastric tenderness without liver or spleen enlargement masses or bruits  Musculoskeletal: Normal range of motion. She exhibits no edema.  Kyphotic posture  Lymphadenopathy:    She has no cervical adenopathy.  Neurological: She is alert and oriented to person, place, and time. She has normal reflexes. No cranial nerve deficit.  Skin: Skin is warm and dry. No rash noted.  Psychiatric: She has a normal mood and affect. Her behavior is normal. Judgment and thought content normal.  Nursing note and vitals reviewed.  BP 123/78 (BP Location: Right Arm)   Pulse 96   Temp 97.8 F (36.6 C) (Oral)   Ht 5' (1.524 m)   Wt 116 lb (52.6 kg)   BMI 22.65 kg/m         Assessment & Plan:  1. Osteoporosis, unspecified osteoporosis type, unspecified pathological fracture presence -Continue with  weightbearing exercise calcium replacement and vitamin D - CBC with Differential/Platelet  2. Vitamin D deficiency -Continue with vitamin D replacement pending results of lab work - CBC with Differential/Platelet - VITAMIN D 25 Hydroxy (Vit-D Deficiency, Fractures)  3. Essential hypertension -The blood pressure remains good and she should continue with current treatment - BMP8+EGFR - CBC with Differential/Platelet - Hepatic function panel  4. Pure hypercholesterolemia -Continue with aggressive therapeutic lifestyle changes - CBC with Differential/Platelet - Lipid panel  5. Hypothyroidism, unspecified type -Continue with current treatment pending results of lab work - CBC with Differential/Platelet - Thyroid Panel With TSH  6. Hypothyroidism due to acquired atrophy of thyroid -Continue with current treatment pending results of lab work  7. Generalized anxiety disorder -Continue with current treatment as needed  Meds ordered this encounter  Medications  . oxazepam (SERAX) 15 MG capsule    Sig: TAKE ONE (1) CAPSULE THREE (3) TIMES EACH DAY    Dispense:  90 capsule    Refill:  0   Patient Instructions                         Medicare Annual Wellness Visit  [facility] and the medical providers at [facility] strive to bring you the best medical care.  In doing so we not only want to address your current medical conditions and concerns but also to detect new conditions early and prevent illness, disease and health-related problems.    Medicare offers a yearly Wellness Visit which allows our clinical staff to assess your need for preventative services including immunizations, lifestyle education, counseling to decrease risk of preventable diseases and screening for fall risk and other medical concerns.    This visit is provided free of charge (no copay) for all Medicare recipients. The clinical pharmacists at [facility] have begun to  conduct these Wellness Visits which will also include a thorough review of all your medications.    As you primary medical provider recommend that you make an appointment for your Annual Wellness Visit if you have not done so already this year.  You may set up this appointment before you leave today or you may call back ([phone]) and schedule an appointment.  Please make sure when you call that you mention that you are scheduling your Annual Wellness Visit with the clinical pharmacist so that the appointment may be made for the proper length of time.     Continue current medications. Continue good therapeutic lifestyle changes which include good diet and exercise. Fall precautions discussed with patient. If an FOBT was given today- please return it to our front desk. If you are over 50 years old - you may need Prevnar 13 or the adult Pneumonia vaccine.  **Flu shots are available--- please call and schedule a FLU-CLINIC appointment**  After your visit with us today you will receive a survey in the mail or online from [employer] regarding your care with us. Please take a moment to fill this out. Your feedback is very important to us as you can help us better understand your patient needs as well as improve your experience and satisfaction. WE CARE ABOUT YOU!!!   Continue with current treatment and we will call with lab work results as soon as they become available Take Mucinex maximum strength 1 twice daily as needed for cough and congestion Drink plenty of fluids and stay well-hydrated Avoid being outside especially in the colder weather and always be careful not to put yourself at risk for falling Keep the house as cool as possible and continue to use cool mist humidification  Don W. Moore MD    

## 2017-09-29 LAB — CBC WITH DIFFERENTIAL/PLATELET
BASOS: 1 %
Basophils Absolute: 0.1 10*3/uL (ref 0.0–0.2)
EOS (ABSOLUTE): 0.2 10*3/uL (ref 0.0–0.4)
EOS: 3 %
HEMATOCRIT: 38.4 % (ref 34.0–46.6)
HEMOGLOBIN: 12.4 g/dL (ref 11.1–15.9)
IMMATURE GRANS (ABS): 0 10*3/uL (ref 0.0–0.1)
IMMATURE GRANULOCYTES: 0 %
LYMPHS: 33 %
Lymphocytes Absolute: 2.3 10*3/uL (ref 0.7–3.1)
MCH: 29.2 pg (ref 26.6–33.0)
MCHC: 32.3 g/dL (ref 31.5–35.7)
MCV: 91 fL (ref 79–97)
MONOCYTES: 11 %
MONOS ABS: 0.7 10*3/uL (ref 0.1–0.9)
NEUTROS PCT: 52 %
Neutrophils Absolute: 3.6 10*3/uL (ref 1.4–7.0)
Platelets: 293 10*3/uL (ref 150–379)
RBC: 4.24 x10E6/uL (ref 3.77–5.28)
RDW: 13.9 % (ref 12.3–15.4)
WBC: 6.8 10*3/uL (ref 3.4–10.8)

## 2017-09-29 LAB — LIPID PANEL
CHOL/HDL RATIO: 2.9 ratio (ref 0.0–4.4)
Cholesterol, Total: 136 mg/dL (ref 100–199)
HDL: 47 mg/dL (ref >39)
LDL CALC: 66 mg/dL (ref 0–99)
Triglycerides: 117 mg/dL (ref 0–149)
VLDL CHOLESTEROL CAL: 23 mg/dL (ref 5–40)

## 2017-09-29 LAB — BMP8+EGFR
BUN/Creatinine Ratio: 17 (ref 12–28)
BUN: 17 mg/dL (ref 8–27)
CO2: 29 mmol/L (ref 20–29)
CREATININE: 0.99 mg/dL (ref 0.57–1.00)
Calcium: 9.3 mg/dL (ref 8.7–10.3)
Chloride: 101 mmol/L (ref 96–106)
GFR calc Af Amer: 63 mL/min/{1.73_m2} (ref >59)
GFR, EST NON AFRICAN AMERICAN: 55 mL/min/{1.73_m2} — AB (ref >59)
Glucose: 98 mg/dL (ref 65–99)
Potassium: 3.8 mmol/L (ref 3.5–5.2)
SODIUM: 143 mmol/L (ref 134–144)

## 2017-09-29 LAB — HEPATIC FUNCTION PANEL
ALBUMIN: 4.2 g/dL (ref 3.5–4.8)
ALT: 8 IU/L (ref 0–32)
AST: 17 IU/L (ref 0–40)
Alkaline Phosphatase: 104 IU/L (ref 39–117)
BILIRUBIN TOTAL: 0.4 mg/dL (ref 0.0–1.2)
BILIRUBIN, DIRECT: 0.13 mg/dL (ref 0.00–0.40)
Total Protein: 6.8 g/dL (ref 6.0–8.5)

## 2017-09-29 LAB — THYROID PANEL WITH TSH
Free Thyroxine Index: 2.8 (ref 1.2–4.9)
T3 Uptake Ratio: 28 % (ref 24–39)
T4, Total: 10 ug/dL (ref 4.5–12.0)
TSH: 0.916 u[IU]/mL (ref 0.450–4.500)

## 2017-09-29 LAB — VITAMIN D 25 HYDROXY (VIT D DEFICIENCY, FRACTURES): Vit D, 25-Hydroxy: 30.6 ng/mL (ref 30.0–100.0)

## 2017-10-24 ENCOUNTER — Other Ambulatory Visit: Payer: Medicare HMO

## 2017-10-24 DIAGNOSIS — Z1212 Encounter for screening for malignant neoplasm of rectum: Secondary | ICD-10-CM | POA: Diagnosis not present

## 2017-10-24 DIAGNOSIS — Z1231 Encounter for screening mammogram for malignant neoplasm of breast: Secondary | ICD-10-CM | POA: Diagnosis not present

## 2017-10-24 LAB — HM MAMMOGRAPHY

## 2017-10-26 LAB — FECAL OCCULT BLOOD, IMMUNOCHEMICAL: Fecal Occult Bld: NEGATIVE

## 2017-11-02 ENCOUNTER — Other Ambulatory Visit: Payer: Self-pay | Admitting: Family Medicine

## 2017-11-02 MED ORDER — HYDROCHLOROTHIAZIDE 25 MG PO TABS: 25.0000 mg | ORAL_TABLET | Freq: Every day | ORAL | 1 refills | Status: DC

## 2017-11-02 MED ORDER — OXAZEPAM 15 MG PO CAPS: ORAL_CAPSULE | ORAL | 1 refills | Status: DC

## 2017-11-02 NOTE — Telephone Encounter (Signed)
What is the name of the medication? Hydrochlorothiazide 25 mg, Oxazepan 15 mg  Have you contacted your pharmacy to request a refill? NO  Which pharmacy [facility] you [facility] [place]   Patient notified that their request is being sent to the clinical staff for review and that they should receive a call once it is complete. If they do not receive a call within 24 hours they can check with their pharmacy or our office.

## 2017-11-23 ENCOUNTER — Ambulatory Visit: Payer: Medicare HMO | Admitting: Cardiology

## 2017-12-26 ENCOUNTER — Encounter: Payer: Self-pay | Admitting: Cardiology

## 2017-12-26 ENCOUNTER — Telehealth: Payer: Self-pay | Admitting: Cardiology

## 2017-12-26 NOTE — Telephone Encounter (Signed)
Closed Encounter  °

## 2017-12-27 ENCOUNTER — Encounter: Payer: Self-pay | Admitting: Family Medicine

## 2017-12-27 ENCOUNTER — Ambulatory Visit (INDEPENDENT_AMBULATORY_CARE_PROVIDER_SITE_OTHER): Payer: Medicare HMO | Admitting: Family Medicine

## 2017-12-27 VITALS — BP 142/86 | HR 114 | Temp 96.4°F | Ht 60.0 in | Wt 116.0 lb

## 2017-12-27 DIAGNOSIS — I10 Essential (primary) hypertension: Secondary | ICD-10-CM | POA: Diagnosis not present

## 2017-12-27 DIAGNOSIS — R12 Heartburn: Secondary | ICD-10-CM | POA: Diagnosis not present

## 2017-12-27 DIAGNOSIS — R1310 Dysphagia, unspecified: Secondary | ICD-10-CM

## 2017-12-27 DIAGNOSIS — R000 Tachycardia, unspecified: Secondary | ICD-10-CM | POA: Diagnosis not present

## 2017-12-27 DIAGNOSIS — K2100 Gastro-esophageal reflux disease with esophagitis, without bleeding: Secondary | ICD-10-CM

## 2017-12-27 DIAGNOSIS — R1013 Epigastric pain: Secondary | ICD-10-CM

## 2017-12-27 DIAGNOSIS — R1010 Upper abdominal pain, unspecified: Secondary | ICD-10-CM | POA: Diagnosis not present

## 2017-12-27 DIAGNOSIS — R1319 Other dysphagia: Secondary | ICD-10-CM

## 2017-12-27 NOTE — Patient Instructions (Signed)
Continue to avoid all caffeine Watch sodium intake Take Carafate regularly 4 times daily before meals and I emphasized 4 times daily before meals Drink plenty of fluids and stay well-hydrated Call in 2 weeks if worse We will try to get an appointment for you to see Dr. May God as soon as possible We will call with lab work results and with FOBT results when that card is returned

## 2017-12-27 NOTE — Addendum Note (Signed)
Addended by: Zannie Cove on: 12/27/2017 11:13 AM   Modules accepted: Orders

## 2017-12-27 NOTE — Progress Notes (Signed)
Subjective:    Patient ID: [name], female    DOB: Jul 02, 1941, 77 y.o.   MRN: [id number]  HPI Patient here today for heartburn.  This patient is having increased problems with GERD and heartburn and has trouble eating.  The computer says she is supposed to be taking Carafate daily we will check this out with her during the initial visit.  She has not been taking her Carafate regularly.  She only takes it when needed.  She has had endoscopies in the past but it has been a good while since she had one.  She has seen Dr. Watt Climes in the past.  She does not drink any caffeine.  She has trouble swallowing pills and always had.  She has trouble now with swallowing any kind of food without getting strangled easily.  She especially notes that chicken is bad for this.  She has not seen any blood in the stool but has a lot of heartburn.    Patient Active Problem List   Diagnosis Date Noted  . PAC (premature atrial contraction) 12/30/2015  . Essential hypertension 12/18/2015  . Lower extremity edema 12/18/2015  . Palpitations 12/18/2015  . Hyperlipemia 02/14/2013  . Hypothyroid 02/14/2013  . Vitamin D deficiency 02/14/2013  . Generalized anxiety disorder 02/14/2013  . Constipation   . Esophageal stricture   . Osteoporosis with pathological fracture   . GERD (gastroesophageal reflux disease)    Outpatient Encounter Medications as of 12/27/2017  Medication Sig  . acetaminophen (TYLENOL) 500 MG tablet Take 500 mg by mouth every 8 (eight) hours as needed for mild pain or moderate pain.  Marland Kitchen albuterol (PROVENTIL HFA;VENTOLIN HFA) 108 (90 Base) MCG/ACT inhaler Inhale 2 puffs into the lungs every 6 (six) hours as needed for wheezing or shortness of breath.  Marland Kitchen atorvastatin (LIPITOR) 80 MG tablet TAKE 1 TABLET EVERY DAY  . Calcium-Phosphorus-Vitamin D ([place]) [phone] MG-MG-UNIT CHEW Chew 1 each by mouth 2 (two) times daily.  . cetirizine (ZYRTEC) 10 MG tablet Take 10 mg by mouth  daily.  . Cholecalciferol (VITAMIN D3) 2000 units CHEW Take 1 tablet by mouth daily.    . furosemide (LASIX) 20 MG tablet Take 20 mg by mouth.  . hydrochlorothiazide (HYDRODIURIL) 25 MG tablet Take 1 tablet (25 mg total) by mouth daily.  Marland Kitchen levothyroxine (SYNTHROID, LEVOTHROID) 100 MCG tablet Take 50 mcg by mouth See admin instructions. Takes Mon through Fri. DOES NOT TAKE ON SAT and SUN  . metoprolol tartrate (LOPRESSOR) 25 MG tablet Take 1 tablet (25 mg total) by mouth 2 (two) times daily.  . Multiple Vitamin (MULTIVITAMIN WITH MINERALS) TABS tablet Take 1 tablet by mouth daily.  Marland Kitchen oxazepam (SERAX) 15 MG capsule TAKE ONE (1) CAPSULE THREE (3) TIMES EACH DAY  . polyethylene glycol (MIRALAX / GLYCOLAX) packet Take 17 g by mouth daily.  . potassium chloride SA (K-DUR,KLOR-CON) 20 MEQ tablet Take 20 mEq by mouth every other day.  . sucralfate (CARAFATE) 1 g tablet TAKE 1 TABLET TWICE DAILY   No facility-administered encounter medications on file as of 12/27/2017.       Review of Systems  Constitutional: Negative.   HENT: Negative.   Eyes: Negative.   Respiratory: Negative.   Cardiovascular: Negative.   Gastrointestinal: Negative.        Heartburn  Endocrine: Negative.   Genitourinary: Negative.   Musculoskeletal: Negative.   Skin: Negative.   Allergic/Immunologic: Negative.   Neurological: Negative.   Hematological: Negative.   Psychiatric/Behavioral:  Negative.        Objective:   Physical Exam  Constitutional: She is oriented to person, place, and time. She appears distressed.  The patient is tiny and appears somewhat older than her stated age.  HENT:  Head: Normocephalic and atraumatic.  Eyes: Conjunctivae and EOM are normal. Pupils are equal, round, and reactive to light. Right eye exhibits no discharge. Left eye exhibits no discharge. No scleral icterus.  Neck: Normal range of motion.  Cardiovascular: Regular rhythm and normal heart sounds.  No murmur heard. Heart is  108/min but regular.  Pulmonary/Chest: Effort normal and breath sounds normal. No respiratory distress. She has no wheezes. She has no rales.  Clear anteriorly and posteriorly  Abdominal: Soft. Bowel sounds are normal. She exhibits no mass. There is tenderness. There is no rebound and no guarding.  Musculoskeletal: Normal range of motion. She exhibits no edema.  Neurological: She is alert and oriented to person, place, and time.  Skin: Skin is warm and dry. No rash noted.  Psychiatric: She has a normal mood and affect. Her behavior is normal. Judgment and thought content normal.  Nursing note and vitals reviewed.  BP (!) 142/86 (BP Location: Left Arm)   Pulse (!) 114   Temp (!) 96.4 F (35.8 C) (Oral)   Ht 5' (1.524 m)   Wt 116 lb (52.6 kg)   BMI 22.65 kg/m    CBC will be done today an FOBT card given.     Assessment & Plan:  1. Pain of upper abdomen -The patient will increase her Carafate to 4 times daily before meals and at bedtime instead of as needed use.  I emphasized to her today that this should be done indefinitely and regularly and not do the as needed use of this medicine. -CBC -FOBT  2. Heartburn -Carafate, dissolved 4 times daily before meals and at bedtime  3. Esophageal dysphagia -Refer to gastroenterologist  4. Essential hypertension -Continue with blood pressure medicine and watching sodium intake  5. Tachycardia -The patient was stressed today because of an interaction with her husband regarding her not taking her medicine regularly.  6. Epigastric pain -Take dissolved Carafate 4 times daily before meals and at bedtime -Call in a couple of weeks if she is not seen the gastroenterologist and let us know how she is doing or sooner if she gets worse.  7. Gastroesophageal reflux disease with esophagitis -Take medication as directed and refer to gastroenterology.DR [facility]  Patient Instructions  Continue to avoid all caffeine Watch sodium intake Take  Carafate regularly 4 times daily before meals and I emphasized 4 times daily before meals Drink plenty of fluids and stay well-hydrated Call in 2 weeks if worse We will try to get an appointment for you to see Dr. May God as soon as possible We will call with lab work results and with FOBT results when that card is returned  Arrie Senate MD

## 2017-12-28 LAB — CBC WITH DIFFERENTIAL/PLATELET
BASOS: 1 %
Basophils Absolute: 0 10*3/uL (ref 0.0–0.2)
EOS (ABSOLUTE): 0.1 10*3/uL (ref 0.0–0.4)
Eos: 2 %
HEMOGLOBIN: 13.4 g/dL (ref 11.1–15.9)
Hematocrit: 40.6 % (ref 34.0–46.6)
IMMATURE GRANULOCYTES: 0 %
Immature Grans (Abs): 0 10*3/uL (ref 0.0–0.1)
LYMPHS ABS: 2 10*3/uL (ref 0.7–3.1)
Lymphs: 29 %
MCH: 30.5 pg (ref 26.6–33.0)
MCHC: 33 g/dL (ref 31.5–35.7)
MCV: 92 fL (ref 79–97)
MONOCYTES: 9 %
Monocytes Absolute: 0.6 10*3/uL (ref 0.1–0.9)
NEUTROS PCT: 59 %
Neutrophils Absolute: 4.2 10*3/uL (ref 1.4–7.0)
Platelets: 292 10*3/uL (ref 150–379)
RBC: 4.4 x10E6/uL (ref 3.77–5.28)
RDW: 14.5 % (ref 12.3–15.4)
WBC: 7 10*3/uL (ref 3.4–10.8)

## 2017-12-29 ENCOUNTER — Other Ambulatory Visit: Payer: Self-pay | Admitting: Cardiovascular Disease

## 2017-12-29 NOTE — Telephone Encounter (Signed)
Refill Request.  

## 2018-01-02 ENCOUNTER — Telehealth: Payer: Self-pay | Admitting: *Deleted

## 2018-01-02 NOTE — Telephone Encounter (Signed)
Rx has been sent to pharmacy and spoke with patient and advised of follow up with Dr Percival Spanish.

## 2018-01-02 NOTE — Telephone Encounter (Signed)
-----   Message from Chipper Herb, MD sent at 12/30/2017 10:26 PM EDT ----- Keep patient on furosemide and remind patient that she is to see Dr. Percival Spanish in May and already has an appointment. ----- Message ----- From: Earvin Hansen Sent: 12/30/2017   6:04 PM To: Chipper Herb, MD  Hi Dr Laurance Flatten,  Dr Oval Linsey received a refill request from Ms Milton on Furosamide. She last saw the patient March 2017 and changed it to PRN.  She actually requested changing to Dr Percival Spanish so she could be seen in the [place] office. She has had 4 appointments scheduled since October (1 no show and 3 cancelled) and is currently scheduled to see him in May. It looks like you had prescribed HCTZ for her but Furosemide daily is still on her list. Did you want her on both? Looks like it has been a while since she had the Furosemide filled. If you could just take a look at her chart when you get a chance I would appreciate it.   Thank you  Rip Harbour

## 2018-01-02 NOTE — Telephone Encounter (Signed)
-----   Message from Chipper Herb, MD sent at 12/30/2017 10:26 PM EDT ----- Keep patient on furosemide and remind patient that she is to see Dr. Percival Spanish in May and already has an appointment. ----- Message ----- From: Earvin Hansen Sent: 12/30/2017   6:04 PM To: Chipper Herb, MD  Hi Dr Laurance Flatten,  Dr Oval Linsey received a refill request from Ms Killen on Furosamide. She last saw the patient March 2017 and changed it to PRN.  She actually requested changing to Dr Percival Spanish so she could be seen in the [place] office. She has had 4 appointments scheduled since October (1 no show and 3 cancelled) and is currently scheduled to see him in May. It looks like you had prescribed HCTZ for her but Furosemide daily is still on her list. Did you want her on both? Looks like it has been a while since she had the Furosemide filled. If you could just take a look at her chart when you get a chance I would appreciate it.   Thank you  Rip Harbour

## 2018-01-02 NOTE — Telephone Encounter (Signed)
Per pt she only takes Lasix prn RX [facility] per Dr Laurance Flatten

## 2018-01-04 ENCOUNTER — Ambulatory Visit: Payer: Self-pay | Admitting: Cardiology

## 2018-01-11 ENCOUNTER — Encounter (HOSPITAL_COMMUNITY): Payer: Self-pay

## 2018-01-11 ENCOUNTER — Other Ambulatory Visit: Payer: Self-pay | Admitting: Gastroenterology

## 2018-01-11 ENCOUNTER — Other Ambulatory Visit: Payer: Self-pay

## 2018-01-11 DIAGNOSIS — R1311 Dysphagia, oral phase: Secondary | ICD-10-CM | POA: Diagnosis not present

## 2018-01-12 ENCOUNTER — Other Ambulatory Visit: Payer: Self-pay | Admitting: Family Medicine

## 2018-01-13 NOTE — Telephone Encounter (Signed)
Last seen 12/27/17  DWM

## 2018-01-16 ENCOUNTER — Other Ambulatory Visit: Payer: Self-pay | Admitting: Gastroenterology

## 2018-01-18 NOTE — Anesthesia Preprocedure Evaluation (Addendum)
Anesthesia Evaluation  Patient identified by MRN, date of birth, ID band Patient awake    Reviewed: Allergy & Precautions, NPO status , Patient's Chart, lab work & pertinent test results, reviewed documented beta blocker date and time   Airway Mallampati: II  TM Distance: >3 FB Neck ROM: Full    Dental no notable dental hx. (+) Upper Dentures, Edentulous Lower   Pulmonary neg pulmonary ROS, former smoker,    Pulmonary exam normal breath sounds clear to auscultation       Cardiovascular hypertension, Pt. 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   Neuro/Psych PSYCHIATRIC DISORDERS Anxiety  Neuromuscular disease    GI/Hepatic Neg liver ROS, hiatal hernia, GERD  Medicated,Esophageal stricture    Endo/Other  Hypothyroidism Hyperlipidemia  Renal/GU negative Renal ROS  negative genitourinary   Musculoskeletal Osteoporosis Hx/o Pathologic Fx   Abdominal   Peds  Hematology negative hematology ROS (+)   Anesthesia Other Findings   Reproductive/Obstetrics                           Anesthesia Physical Anesthesia Plan  ASA: II  Anesthesia Plan: MAC   Post-op Pain Management:    Induction:   PONV Risk Score and Plan: 2  Airway Management Planned: Natural Airway and Nasal Cannula  Additional Equipment:   Intra-op Plan:   Post-operative Plan:   Informed Consent: I have reviewed the patients History and Physical, chart, labs and discussed the procedure including the risks, benefits and alternatives for the proposed anesthesia with the patient or authorized representative who has indicated his/her understanding and acceptance.     Plan Discussed with: CRNA, Anesthesiologist and Surgeon  Anesthesia Plan Comments:        Anesthesia Quick Evaluation

## 2018-01-19 ENCOUNTER — Ambulatory Visit (HOSPITAL_COMMUNITY): Payer: Medicare HMO

## 2018-01-19 ENCOUNTER — Other Ambulatory Visit: Payer: Self-pay

## 2018-01-19 ENCOUNTER — Ambulatory Visit (HOSPITAL_COMMUNITY)
Admission: RE | Admit: 2018-01-19 | Discharge: 2018-01-19 | Disposition: A | Discharge status: Home / Self Care | Payer: Medicare HMO | Source: Ambulatory Visit | Attending: Gastroenterology | Admitting: Gastroenterology

## 2018-01-19 ENCOUNTER — Encounter (HOSPITAL_COMMUNITY): Payer: Self-pay | Admitting: *Deleted

## 2018-01-19 ENCOUNTER — Encounter (HOSPITAL_COMMUNITY)
Admission: RE | Disposition: A | Discharge status: Home / Self Care | Payer: Self-pay | Source: Ambulatory Visit | Attending: Gastroenterology

## 2018-01-19 ENCOUNTER — Ambulatory Visit (HOSPITAL_COMMUNITY): Payer: Medicare HMO | Admitting: Anesthesiology

## 2018-01-19 DIAGNOSIS — Z888 Allergy status to other drugs, medicaments and biological substances status: Secondary | ICD-10-CM | POA: Insufficient documentation

## 2018-01-19 DIAGNOSIS — I452 Bifascicular block: Secondary | ICD-10-CM | POA: Diagnosis not present

## 2018-01-19 DIAGNOSIS — F419 Anxiety disorder, unspecified: Secondary | ICD-10-CM | POA: Insufficient documentation

## 2018-01-19 DIAGNOSIS — K222 Esophageal obstruction: Secondary | ICD-10-CM | POA: Diagnosis not present

## 2018-01-19 DIAGNOSIS — M810 Age-related osteoporosis without current pathological fracture: Secondary | ICD-10-CM | POA: Insufficient documentation

## 2018-01-19 DIAGNOSIS — Z882 Allergy status to sulfonamides status: Secondary | ICD-10-CM | POA: Diagnosis not present

## 2018-01-19 DIAGNOSIS — E785 Hyperlipidemia, unspecified: Secondary | ICD-10-CM | POA: Insufficient documentation

## 2018-01-19 DIAGNOSIS — Q394 Esophageal web: Secondary | ICD-10-CM | POA: Diagnosis not present

## 2018-01-19 DIAGNOSIS — R1310 Dysphagia, unspecified: Secondary | ICD-10-CM

## 2018-01-19 DIAGNOSIS — I10 Essential (primary) hypertension: Secondary | ICD-10-CM | POA: Insufficient documentation

## 2018-01-19 DIAGNOSIS — Z881 Allergy status to other antibiotic agents status: Secondary | ICD-10-CM | POA: Diagnosis not present

## 2018-01-19 DIAGNOSIS — Z87891 Personal history of nicotine dependence: Secondary | ICD-10-CM | POA: Diagnosis not present

## 2018-01-19 DIAGNOSIS — K449 Diaphragmatic hernia without obstruction or gangrene: Secondary | ICD-10-CM | POA: Insufficient documentation

## 2018-01-19 DIAGNOSIS — Z79899 Other long term (current) drug therapy: Secondary | ICD-10-CM | POA: Diagnosis not present

## 2018-01-19 DIAGNOSIS — Z885 Allergy status to narcotic agent status: Secondary | ICD-10-CM | POA: Insufficient documentation

## 2018-01-19 DIAGNOSIS — K219 Gastro-esophageal reflux disease without esophagitis: Secondary | ICD-10-CM | POA: Insufficient documentation

## 2018-01-19 DIAGNOSIS — E039 Hypothyroidism, unspecified: Secondary | ICD-10-CM | POA: Insufficient documentation

## 2018-01-19 DIAGNOSIS — R69 Illness, unspecified: Secondary | ICD-10-CM | POA: Diagnosis not present

## 2018-01-19 HISTORY — PX: ESOPHAGOGASTRODUODENOSCOPY: SHX5428

## 2018-01-19 SURGERY — EGD (ESOPHAGOGASTRODUODENOSCOPY)
Anesthesia: Monitor Anesthesia Care

## 2018-01-19 MED ORDER — PROPOFOL 10 MG/ML IV BOLUS
INTRAVENOUS | Status: AC
  Filled 2018-01-19: qty 40

## 2018-01-19 MED ORDER — ONDANSETRON HCL 4 MG/2ML IJ SOLN
INTRAMUSCULAR | Status: DC | PRN
  Administered 2018-01-19: 4 mg via INTRAVENOUS

## 2018-01-19 MED ORDER — PROPOFOL 500 MG/50ML IV EMUL
INTRAVENOUS | Status: DC | PRN
  Administered 2018-01-19: 125 ug/kg/min via INTRAVENOUS

## 2018-01-19 MED ORDER — LACTATED RINGERS IV SOLN
INTRAVENOUS | Status: DC
  Administered 2018-01-19: 1000 mL via INTRAVENOUS

## 2018-01-19 MED ORDER — PROPOFOL 500 MG/50ML IV EMUL
INTRAVENOUS | Status: DC | PRN
  Administered 2018-01-19: 40 mg via INTRAVENOUS
  Administered 2018-01-19: 10 mg via INTRAVENOUS

## 2018-01-19 MED ORDER — SODIUM CHLORIDE 0.9 % IV SOLN: INTRAVENOUS | Status: DC

## 2018-01-19 NOTE — Anesthesia Postprocedure Evaluation (Signed)
Anesthesia Post Note  Patient: [name]  Procedure(s) Performed: ESOPHAGOGASTRODUODENOSCOPY (EGD) (N/A )     Patient location during evaluation: PACU Anesthesia Type: MAC Level of consciousness: awake and alert and oriented Pain management: pain level controlled Vital Signs Assessment: post-procedure vital signs reviewed and stable Respiratory status: spontaneous breathing, nonlabored ventilation and respiratory function stable Cardiovascular status: stable and blood pressure returned to baseline Postop Assessment: no apparent nausea or vomiting Anesthetic complications: no    Last Vitals:  Vitals:   01/19/18 1245 01/19/18 1250  BP:  110/60  Pulse:  93  Resp:  (!) 21  Temp:  (!) 36.4 C  SpO2: 100% 99%    Last Pain:  Vitals:   01/19/18 1250  TempSrc: Oral  PainSc:                  Maximina Pirozzi A.

## 2018-01-19 NOTE — Transfer of Care (Signed)
Immediate Anesthesia Transfer of Care Note  Patient: [name]  Procedure(s) Performed: ESOPHAGOGASTRODUODENOSCOPY (EGD) (N/A )  Patient Location: PACU  Anesthesia Type:MAC  Level of Consciousness: awake, alert  and oriented  Airway & Oxygen Therapy: Patient Spontanous Breathing and Patient connected to nasal cannula oxygen  Post-op Assessment: Report given to RN and Post -op Vital signs reviewed and stable  Post vital signs: Reviewed and stable  Last Vitals:  Vitals Value Taken Time  BP    Temp    Pulse    Resp    SpO2      Last Pain:  Vitals:   01/19/18 1014  TempSrc: Oral  PainSc: 0-No pain         Complications: No apparent anesthesia complications

## 2018-01-19 NOTE — Progress Notes (Signed)
[name] 12:01 PM  Subjective: Dysphasia but no new medical problems since we have seen her in the office  Objective: Vital signs stable afebrile no acute distress exam please see preassessment evaluation  Assessment: Esophageal stricture dysphasia  Plan: Okay to proceed with endoscopy and dilation with anesthesia assistance  [facility]  Pager [phone] After 5PM or if no answer call [phone]

## 2018-01-19 NOTE — Discharge Instructions (Addendum)
YOU HAD AN ENDOSCOPIC PROCEDURE TODAY: Refer to the procedure report and other information in the discharge instructions given to you for any specific questions about what was found during the examination. If this information does not answer your questions, please call [facility] office at [phone] to clarify.   YOU SHOULD EXPECT: Some feelings of bloating in the abdomen. Passage of more gas than usual. Walking can help get rid of the air that was put into your GI tract during the procedure and reduce the bloating. Some abdominal soreness may be present for a day or two, also.  DIET: for 4 hours clear liquids only then soft solids today and slowly advance tomorrow andYour first meal following the procedure should be a light meal and then it is ok to progress to your normal diet tomorrow. A half-sandwich or bowl of soup is an example of a good first meal. Heavy or fried foods are harder to digest and may make you feel nauseous or bloated. Drink plenty of fluids but you should avoid alcoholic beverages for 24 hours. If you had a esophageal dilation, please see attached instructions for diet.   ACTIVITY: Your care partner should take you home directly after the procedure. You should plan to take it easy, moving slowly for the rest of the day. You can resume normal activity the day after the procedure however YOU SHOULD NOT DRIVE, use power tools, machinery or perform tasks that involve climbing or major physical exertion for 24 hours (because of the sedation medicines used during the test).   SYMPTOMS TO REPORT IMMEDIATELY: A gastroenterologist can be reached at any hour. Please call [phone]  for any of the following symptoms:  Following upper endoscopy (EGD, EUS, ERCP, esophageal dilation) Vomiting of blood or coffee ground material  New, significant abdominal pain  New, significant chest pain or pain under the shoulder blades  Painful or persistently difficult swallowing  New shortness of breath   Black, tarry-looking or red, bloody stools  FOLLOW UP:  If any biopsies were taken you will be contacted by phone or by letter within the next 1-3 weeks. Call [phone]  if you have not heard about the biopsies in 3 weeks.  Please also call with any specific questions about appointments or follow up tests. Call if question or problem or if swallowing gets worse otherwise follow-up in one month and we are calling you in a stomach medicine to take once a day if it is not covered by her insurance are too expensive please call me

## 2018-01-19 NOTE — Anesthesia Procedure Notes (Signed)
Date/Time: 01/19/2018 12:01 PM Performed by: Glory Buff, CRNA Oxygen Delivery Method: Nasal cannula

## 2018-01-19 NOTE — Op Note (Signed)
[facility] Patient Name: [name] Procedure Date: 01/19/2018 MRN: [id number] Attending MD: Clarene Essex , MD Date of Birth: 29-Apr-1941 CSN: [id number] Age: 77 Admit Type: Inpatient Procedure:                Upper GI endoscopy Indications:              Dysphagia Providers:                Clarene Essex, MD, Burtis Junes, RN, William Dalton,                            Technician Referring MD:              Medicines:                Propofol total dose 150 mg IV, Ondansetron 4 mg IV Complications:            Tear Estimated Blood Loss:     Estimated blood loss was minimal. Estimated blood                            loss was minimal. Procedure:                Pre-Anesthesia Assessment:                           - Prior to the procedure, a History and Physical                            was performed, and patient medications and                            allergies were reviewed. The patient's tolerance of                            previous anesthesia was also reviewed. The risks                            and benefits of the procedure and the sedation                            options and risks were discussed with the patient.                            All questions were answered, and informed consent                            was obtained. Prior Anticoagulants: The patient has                            taken no previous anticoagulant or antiplatelet                            agents. ASA Grade Assessment: III - A patient with  severe systemic disease. After reviewing the risks                            and benefits, the patient was deemed in                            satisfactory condition to undergo the procedure.                           After obtaining informed consent, the endoscope was                            passed under direct vision. Throughout the                            procedure, the patient's blood pressure, pulse, and                         oxygen saturations were monitored continuously. The                            [id number]) scope was introduced through the                            mouth, and advanced to the upper esophageal                            sphincter and she had a proximal ring and we were                            not able to advance the scope and so we changed to                            the pediatric endoscope but again were not able to                            advance past a ring says a Savary wire was placed                            endoscopically through the ring and under                            fluoroscopy guidance into the stomach in the                            customary J loop was confirmed in the stomach and                            we proceeded with the dilations as below and then                            we removed the dilator and wire and re-advance the  pediatric endoscope i.e. The [id number] ([id number])                            scope which was introduced through the mouth, and                            advanced to the second part of duodenum. The upper                            GI endoscopy was technically difficult and complex                            due to stenosis as above. Successful completion of                            the procedure was aided by withdrawing the scope                            and replacing with the pediatric endoscope. The                            patient tolerated the procedure well. Scope In: Scope Out: Findings:      Two benign-appearing, intrinsic severe (stenosis; an endoscope cannot       pass nor the pediatric endoscope as above) stenoses were found. The       stenoses were traversed after dilation. A guidewire was placed under       fluoroscopic guidance and the scope was withdrawn. Dilation was       performed with a Savary dilator with no resistance at 7 mm, 9 mm and 10       mm.  there was minimal heme on all 3 dilators The dilation site was       examined following endoscope reinsertion and showed moderate mucosal       disruption and moderate improvement in luminal narrowing. There was a       small proximal esophageal tear 2 mid esophageal tears and a small distal       esophageal tear in the distal stricture which was seemingly more patent       than the proximal 1 and there was no active bleeding at the end of the       procedure      A small hiatal hernia was present.      The duodenal bulb, first portion of the duodenum and second portion of       the duodenum were normal.      The exam was otherwise without abnormality. Impression:               - Benign-appearing esophageal stenoses. Dilated to                            10 mm as above.                           - Small hiatal hernia.                           -  Normal duodenal bulb, first portion of the                            duodenum and second portion of the duodenum.                           - The examination was otherwise normal.                           - No specimens collected. Moderate Sedation:      N/A- Per Anesthesia Care Recommendation:           - Patient has a contact number available for                            emergencies. The signs and symptoms of potential                            delayed complications were discussed with the                            patient. Return to normal activities tomorrow.                            Written discharge instructions were provided to the                            patient.                           - Clear liquid diet for 4 hours. If doing okay may                            have soft solids later today                           - Continue present medications.                           - Return to GI clinic in 4 weeks.                           - Telephone GI clinic for lab results PRN. Consider                            more  aggressive dilation in the future and will add                            pump inhibitor and she might have to open the                            capsules and sprinkled on applesauce Procedure Code(s):        --- Professional ---  43248, Esophagogastroduodenoscopy, flexible,                            transoral; with insertion of guide wire followed by                            passage of dilator(s) through esophagus over guide                            wire Diagnosis Code(s):        --- Professional ---                           K22.2, Esophageal obstruction                           K44.9, Diaphragmatic hernia without obstruction or                            gangrene                           R13.10, Dysphagia, unspecified CPT copyright 2017 [employer]. All rights reserved. The codes documented in this report are preliminary and upon coder review may  be revised to meet current compliance requirements. Clarene Essex, MD 01/19/2018 12:47:47 PM This report has been signed electronically. Number of Addenda: 0

## 2018-01-20 ENCOUNTER — Encounter (HOSPITAL_COMMUNITY): Payer: Self-pay | Admitting: Gastroenterology

## 2018-02-02 ENCOUNTER — Ambulatory Visit: Payer: Medicare HMO | Admitting: Family Medicine

## 2018-02-08 ENCOUNTER — Encounter: Payer: Self-pay | Admitting: Cardiology

## 2018-02-13 ENCOUNTER — Other Ambulatory Visit: Payer: Self-pay | Admitting: Nurse Practitioner

## 2018-02-13 MED ORDER — OXAZEPAM 15 MG PO CAPS: ORAL_CAPSULE | ORAL | 1 refills | Status: DC

## 2018-02-13 NOTE — Addendum Note (Signed)
Addended by: Zannie Cove on: 02/13/2018 11:53 AM   Modules accepted: Orders

## 2018-02-13 NOTE — Addendum Note (Signed)
Addended by: Antonietta Barcelona D on: 02/13/2018 11:27 AM   Modules accepted: Orders

## 2018-02-21 ENCOUNTER — Ambulatory Visit (INDEPENDENT_AMBULATORY_CARE_PROVIDER_SITE_OTHER): Payer: Medicare HMO | Admitting: Family Medicine

## 2018-02-21 ENCOUNTER — Encounter: Payer: Self-pay | Admitting: Family Medicine

## 2018-02-21 VITALS — BP 110/71 | HR 97 | Temp 97.6°F | Ht 60.0 in | Wt 119.0 lb

## 2018-02-21 DIAGNOSIS — I700 Atherosclerosis of aorta: Secondary | ICD-10-CM

## 2018-02-21 DIAGNOSIS — K2100 Gastro-esophageal reflux disease with esophagitis, without bleeding: Secondary | ICD-10-CM

## 2018-02-21 DIAGNOSIS — R1310 Dysphagia, unspecified: Secondary | ICD-10-CM | POA: Diagnosis not present

## 2018-02-21 DIAGNOSIS — I10 Essential (primary) hypertension: Secondary | ICD-10-CM

## 2018-02-21 DIAGNOSIS — E039 Hypothyroidism, unspecified: Secondary | ICD-10-CM | POA: Diagnosis not present

## 2018-02-21 DIAGNOSIS — E559 Vitamin D deficiency, unspecified: Secondary | ICD-10-CM | POA: Diagnosis not present

## 2018-02-21 DIAGNOSIS — E7800 Pure hypercholesterolemia, unspecified: Secondary | ICD-10-CM

## 2018-02-21 DIAGNOSIS — M810 Age-related osteoporosis without current pathological fracture: Secondary | ICD-10-CM | POA: Diagnosis not present

## 2018-02-21 DIAGNOSIS — R1319 Other dysphagia: Secondary | ICD-10-CM

## 2018-02-21 NOTE — Progress Notes (Signed)
Cardiology Office Note   Date:  02/22/2018   ID:  SULAY BRYMER, DOB 20-Jun-1941, MRN [id number]  PCP:  Chipper Herb, MD  Cardiologist:   No primary care provider on file.   Chief Complaint  Patient presents with  . Hypertension      History of Present Illness: [name] is a 77 y.o. female who presents for follow up of palpitations. .  She had 3 cancelled appts and one now show and has not yet been to see me.  She was seeing Dr. Oval Linsey but she lives in [place].   She was being treated for PACs, HTN and anemia.  She has been feeling well.  She does not take her beta-blocker.  She does not really feel palpitations.  She does a little light household activities but not much.  She is thinking about starting walking.  She denies any chest pressure, neck or arm discomfort.  She has no new shortness of breath, PND or orthopnea.  Past Medical History:  Diagnosis Date  . Anxiety states   . Breast cyst   . Cancer ([place])    skin  . Cataract   . Constipation   . Esophageal stricture   . Essential hypertension 12/18/2015  . Fracture, rib   . GERD (gastroesophageal reflux disease)   . Hiatal hernia   . Lower extremity edema 12/18/2015  . Osteoporosis   . Other and unspecified hyperlipidemia   . PAC (premature atrial contraction) 12/30/2015  . Palpitations 12/18/2015  . Unspecified hypothyroidism   . Vertebral fracture, osteoporotic [facility])     Past Surgical History:  Procedure Laterality Date  . BREAST BIOPSY     bil cysts  . CATARACT EXTRACTION    . ESOPHAGOGASTRODUODENOSCOPY N/A 01/19/2018   Procedure: ESOPHAGOGASTRODUODENOSCOPY (EGD);  Surgeon: Clarene Essex, MD;  Location: [facility];  Service: Endoscopy;  Laterality: N/A;  with flouro  . EYE SURGERY    . HAND SURGERY    . IR GENERIC HISTORICAL  12/10/2016   IR RADIOLOGIST EVAL & MGMT 12/10/2016 [facility]  . IR GENERIC HISTORICAL  12/23/2016   IR KYPHO THORACIC WITH BONE BIOPSY 12/23/2016 Luanne Bras, MD [facility]  RAD  . PARTIAL HYSTERECTOMY    . THYROID LOBECTOMY     right  . VERTEBROPLASTY       Current Outpatient Medications  Medication Sig Dispense Refill  . acetaminophen (TYLENOL) 500 MG tablet Take 500 mg by mouth every 8 (eight) hours as needed for mild pain or moderate pain.    Marland Kitchen albuterol (PROVENTIL HFA;VENTOLIN HFA) 108 (90 Base) MCG/ACT inhaler Inhale 2 puffs into the lungs every 6 (six) hours as needed for wheezing or shortness of breath. 1 Inhaler 0  . atorvastatin (LIPITOR) 80 MG tablet TAKE 1 TABLET EVERY DAY (Patient taking differently: Take 40 mg by mouth every day at bedtime) 90 tablet 1  . Calcium-Phosphorus-Vitamin D ([place]) [phone] MG-MG-UNIT CHEW Chew 1 each by mouth 2 (two) times daily.    . Carboxymethylcellulose Sodium (LUBRICANT EYE DROPS OP) Place 1 drop into both eyes daily as needed (for dry eyes).    . Cholecalciferol (VITAMIN D3) 2000 units CHEW Chew 4,000 Units by mouth daily.     . furosemide (LASIX) 20 MG tablet TAKE ONE (1) TABLET EACH DAY (Patient taking differently: Take 20 mg by mouth each day) 90 tablet 3  . hydrochlorothiazide (HYDRODIURIL) 25 MG tablet Take 1 tablet (25 mg total) by mouth daily. 90 tablet 1  .  levothyroxine (SYNTHROID, LEVOTHROID) 100 MCG tablet Take 50 mcg by mouth See admin instructions. Takes Mon through Fri. DOES NOT TAKE ON SAT and SUN (Patient taking differently: Take 50 mcg by mouth every Monday, Tuesday, Wednesday, Thursday, and Friday. ) 90 tablet 2  . metoprolol tartrate (LOPRESSOR) 25 MG tablet Take 1 tablet (25 mg total) by mouth 2 (two) times daily as needed. 180 tablet 0  . Multiple Vitamin (MULTIVITAMIN WITH MINERALS) TABS tablet Take 2 tablets by mouth daily.     Marland Kitchen omeprazole (PRILOSEC) 40 MG capsule Take 1 capsule by mouth daily.    Marland Kitchen oxazepam (SERAX) 15 MG capsule TAKE ONE (1) CAPSULE THREE (3) TIMES EACH DAY 90 capsule 1  . polyethylene glycol (MIRALAX / GLYCOLAX) packet Take 17 g by mouth daily as needed  for moderate constipation.     . potassium chloride 20 MEQ/15ML (10%) SOLN Take 10 mEq by mouth daily as needed (for cramping).    . sucralfate (CARAFATE) 1 g tablet TAKE 1 TABLET TWICE DAILY 180 tablet 1   No current facility-administered medications for this visit.     Allergies:   Bisphosphonates; Ciprofloxacin; Codeine; and Sulfa antibiotics     ROS:  Please see the history of present illness.   Otherwise, review of systems are positive for none.   All other systems are reviewed and negative.    PHYSICAL EXAM: VS:  BP 122/80   Pulse 88   Ht 5' (1.524 m)   Wt 119 lb (54 kg)   BMI 23.24 kg/m  , BMI Body mass index is 23.24 kg/m. GENERAL:  Well appearing HEENT:  Pupils equal round and reactive, fundi not visualized, oral mucosa unremarkable NECK:  No jugular venous distention, waveform within normal limits, carotid upstroke brisk and symmetric, no bruits, no thyromegaly LUNGS:  Clear to auscultation bilaterally CHEST:  Unremarkable HEART:  PMI not displaced or sustained,S1 and S2 within normal limits, no S3, no S4, no clicks, no rubs, no murmurs ABD:  Flat, positive bowel sounds normal in frequency in pitch, no bruits, no rebound, no guarding, no midline pulsatile mass, no hepatomegaly, no splenomegaly EXT:  2 plus pulses upper and decreased DP/PT bilateral lower, mild ankle edema, no cyanosis no clubbing    EKG:  EKG is ordered today. The ekg ordered today demonstrates sinus rhythm, rate 88, axis within normal limits, intervals within normal limits, no acute ST-T wave changes.  PACs   Recent Labs: 04/08/2017: BNP 132.7 02/21/2018: ALT 11; BUN 18; Creatinine, Ser 1.30; Hemoglobin 12.9; Platelets 298; Potassium 3.4; Sodium 142; TSH 0.991    Lipid Panel    Component Value Date/Time   CHOL 155 02/21/2018 1549   CHOL 122 07/23/2013 0858   TRIG 167 (H) 02/21/2018 1549   TRIG 116 08/06/2015 1221   TRIG 89 07/23/2013 0858   HDL 47 02/21/2018 1549   HDL 56 08/06/2015 1221    HDL 47 07/23/2013 0858   CHOLHDL 3.3 02/21/2018 1549   LDLCALC 75 02/21/2018 1549   LDLCALC 63 07/17/2014 0929   LDLCALC 57 07/23/2013 0858      Wt Readings from Last 3 Encounters:  02/22/18 119 lb (54 kg)  02/21/18 119 lb (54 kg)  01/19/18 116 lb (52.6 kg)      Other studies Reviewed: Additional studies/ records that were reviewed today include: None. Review of the above records demonstrates:  Please see elsewhere in the note.     ASSESSMENT AND PLAN:  HTN:  The blood pressure is at  target. No change in medications is indicated. We will continue with therapeutic lifestyle changes (TLC).  PACs:   She does not want to take the beta-blocker every day and I do not think she needs it.  She will take a half as needed.  No other evaluation is indicated.  EDEMA: She thinks this is mild.  She does have some decreased pulses but she is not having any claudication.  Encouraged to walk and no other therapy I think is indicated at this point.  No further studies.  Current medicines are reviewed at length with the patient today.  The patient does not have concerns regarding medicines.  The following changes have been made:  As above.   Labs/ tests ordered today include: None  Orders Placed This Encounter  Procedures  . EKG 12-Lead     Disposition:   FU with me as needed.      Signed, Minus Breeding, MD  02/22/2018 3:39 PM     [facility]

## 2018-02-21 NOTE — Progress Notes (Signed)
Subjective:    Patient ID: [name], female    DOB: 08-21-1941, 77 y.o.   MRN: [id number]  HPI Pt here for follow up and management of chronic medical problems which includes hyperlipidemia and hypertension. She is taking medication regularly.  The patient is doing well overall.  She has had a recent endoscopy and dilatation and is swallowing much better.  She is due to get lab work today.  The patient is doing well and in good spirits.  She denies any chest pain pressure palpitations or shortness of breath.  She is doing well with her stomach and her swallowing is much improved and she takes some MiraLAX periodically to keep her bowels regulated so she does not get constipated.  She has not seen any blood in the stool or had any black tarry bowel movements.  She has a follow-up visit with a gastroenterologist following the endoscopy and dilatation sometime later this month.  She is passing her water well.    Patient Active Problem List   Diagnosis Date Noted  . PAC (premature atrial contraction) 12/30/2015  . Essential hypertension 12/18/2015  . Lower extremity edema 12/18/2015  . Palpitations 12/18/2015  . Hyperlipemia 02/14/2013  . Hypothyroid 02/14/2013  . Vitamin D deficiency 02/14/2013  . Generalized anxiety disorder 02/14/2013  . Constipation   . Esophageal stricture   . Osteoporosis with pathological fracture   . GERD (gastroesophageal reflux disease)    Outpatient Encounter Medications as of 02/21/2018  Medication Sig  . acetaminophen (TYLENOL) 500 MG tablet Take 500 mg by mouth every 8 (eight) hours as needed for mild pain or moderate pain.  Marland Kitchen albuterol (PROVENTIL HFA;VENTOLIN HFA) 108 (90 Base) MCG/ACT inhaler Inhale 2 puffs into the lungs every 6 (six) hours as needed for wheezing or shortness of breath.  Marland Kitchen atorvastatin (LIPITOR) 80 MG tablet TAKE 1 TABLET EVERY DAY (Patient taking differently: Take 40 mg by mouth every day at bedtime)  . Calcium-Phosphorus-Vitamin D  ([place]) [phone] MG-MG-UNIT CHEW Chew 1 each by mouth 2 (two) times daily.  . Carboxymethylcellulose Sodium (LUBRICANT EYE DROPS OP) Place 1 drop into both eyes daily as needed (for dry eyes).  . Cholecalciferol (VITAMIN D3) 2000 units CHEW Chew 4,000 Units by mouth daily.   . furosemide (LASIX) 20 MG tablet TAKE ONE (1) TABLET EACH DAY (Patient taking differently: Take 20 mg by mouth each day)  . hydrochlorothiazide (HYDRODIURIL) 25 MG tablet Take 1 tablet (25 mg total) by mouth daily.  Marland Kitchen levothyroxine (SYNTHROID, LEVOTHROID) 100 MCG tablet Take 50 mcg by mouth See admin instructions. Takes Mon through Fri. DOES NOT TAKE ON SAT and SUN (Patient taking differently: Take 50 mcg by mouth every Monday, Tuesday, Wednesday, Thursday, and Friday. )  . metoprolol tartrate (LOPRESSOR) 25 MG tablet Take 1 tablet (25 mg total) by mouth 2 (two) times daily.  . Multiple Vitamin (MULTIVITAMIN WITH MINERALS) TABS tablet Take 2 tablets by mouth daily.   Marland Kitchen omeprazole (PRILOSEC) 40 MG capsule Take 1 capsule by mouth daily.  Marland Kitchen oxazepam (SERAX) 15 MG capsule TAKE ONE (1) CAPSULE THREE (3) TIMES EACH DAY  . polyethylene glycol (MIRALAX / GLYCOLAX) packet Take 17 g by mouth daily as needed for moderate constipation.   . potassium chloride 20 MEQ/15ML (10%) SOLN Take 10 mEq by mouth daily as needed (for cramping).  . sucralfate (CARAFATE) 1 g tablet TAKE 1 TABLET TWICE DAILY   No facility-administered encounter medications on file as of 02/21/2018.  Review of Systems  Constitutional: Negative.   HENT: Negative.   Eyes: Negative.   Respiratory: Negative.   Cardiovascular: Negative.   Gastrointestinal: Negative.   Endocrine: Negative.   Genitourinary: Negative.   Musculoskeletal: Negative.   Skin: Negative.   Allergic/Immunologic: Negative.   Neurological: Negative.   Hematological: Negative.   Psychiatric/Behavioral: Negative.        Objective:   Physical Exam  Constitutional:  She is oriented to person, place, and time. She appears well-developed and well-nourished. No distress.  The patient is pleasant and alert and small frame and build.  HENT:  Head: Normocephalic and atraumatic.  Right Ear: External ear normal.  Left Ear: External ear normal.  Nose: Nose normal.  Mouth/Throat: Oropharynx is clear and moist. No oropharyngeal exudate.  Eyes: Pupils are equal, round, and reactive to light. Conjunctivae and EOM are normal. Right eye exhibits no discharge. Left eye exhibits no discharge.  Neck: Normal range of motion. Neck supple. No thyromegaly present.  No bruits thyromegaly or anterior cervical adenopathy  Cardiovascular: Normal rate, regular rhythm and normal heart sounds.  No murmur heard. Heart was slightly irregular at 84/min total pulses were diminished on the left slightly more than the right.  Pulmonary/Chest: Effort normal and breath sounds normal. She has no wheezes. She has no rales.  Clear anteriorly and posteriorly without wheezes rales or rhonchi  Abdominal: Soft. Bowel sounds are normal. She exhibits no mass. There is no tenderness. There is no guarding.  Slight epigastric tenderness without liver or spleen enlargement masses or inguinal adenopathy or bruits.  Musculoskeletal: Normal range of motion. She exhibits no edema.  Lymphadenopathy:    She has no cervical adenopathy.  Neurological: She is alert and oriented to person, place, and time. She has normal reflexes. No cranial nerve deficit.  Skin: Skin is warm and dry. No rash noted.  Psychiatric: She has a normal mood and affect. Her behavior is normal. Judgment and thought content normal.  The patient was in a positive mood and obviously feels a lot better and then when I saw her the last time.  Nursing note and vitals reviewed.  BP 110/71 (BP Location: Left Arm)   Pulse 97   Temp 97.6 F (36.4 C) (Oral)   Ht 5' (1.524 m)   Wt 119 lb (54 kg)   BMI 23.24 kg/m         Assessment &  Plan:  1. Gastroesophageal reflux disease with esophagitis -She will continue with the 40 mg of omeprazole as prescribed by the gastroenterologist and follow-up with him as planned - CBC with Differential/Platelet - Hepatic function panel  2. Osteoporosis, unspecified osteoporosis type, unspecified pathological fracture presence -Continue with vitamin D and calcium replacement - CBC with Differential/Platelet  3. Vitamin D deficiency -Continue with vitamin D replacement pending results of lab work - CBC with Differential/Platelet - VITAMIN D 25 Hydroxy (Vit-D Deficiency, Fractures)  4. Pure hypercholesterolemia -Continue with atorvastatin and aggressive therapeutic lifestyle changes - CBC with Differential/Platelet - Lipid panel  5. Hypothyroidism, unspecified type -Continue with thyroid replacement pending results of lab work - CBC with Differential/Platelet - Thyroid Panel With TSH  6. Essential hypertension -The blood pressure is at the low end of the normal range and good and she will continue with current treatment - BMP8+EGFR - CBC with Differential/Platelet - Hepatic function panel  7. Atherosclerosis of aorta ([place]) -Continue with aggressive therapeutic lifestyle changes and statin therapy pending results of lab work  8. Esophageal dysphagia -On  recent endoscopy, patient had an esophageal stricture and is doing much better with her swallowing and able to eat much better than she was able to prior to the stricture and dilatation.  Patient Instructions                       Medicare Annual Wellness Visit  [place] and the medical providers at [facility] strive to bring you the best medical care.  In doing so we not only want to address your current medical conditions and concerns but also to detect new conditions early and prevent illness, disease and health-related problems.    Medicare offers a yearly Wellness Visit which allows our clinical  staff to assess your need for preventative services including immunizations, lifestyle education, counseling to decrease risk of preventable diseases and screening for fall risk and other medical concerns.    This visit is provided free of charge (no copay) for all Medicare recipients. The clinical pharmacists at [facility] have begun to conduct these Wellness Visits which will also include a thorough review of all your medications.    As you primary medical provider recommend that you make an appointment for your Annual Wellness Visit if you have not done so already this year.  You may set up this appointment before you leave today or you may call back ([phone]) and schedule an appointment.  Please make sure when you call that you mention that you are scheduling your Annual Wellness Visit with the clinical pharmacist so that the appointment may be made for the proper length of time.     Continue current medications. Continue good therapeutic lifestyle changes which include good diet and exercise. Fall precautions discussed with patient. If an FOBT was given today- please return it to our front desk. If you are over 17 years old - you may need Prevnar 39 or the adult Pneumonia vaccine.  **Flu shots are available--- please call and schedule a FLU-CLINIC appointment**  After your visit with [place] today you will receive a survey in the mail or online from [employer] regarding your care with [place]. Please take a moment to fill this out. Your feedback is very important to [place] as you can help [place] better understand your patient needs as well as improve your experience and satisfaction. WE CARE ABOUT YOU!!!   Follow-up with gastroenterology as planned    Arrie Senate MD

## 2018-02-21 NOTE — Patient Instructions (Addendum)
Medicare Annual Wellness Visit  [place] and the medical providers at [facility] strive to bring you the best medical care.  In doing so we not only want to address your current medical conditions and concerns but also to detect new conditions early and prevent illness, disease and health-related problems.    Medicare offers a yearly Wellness Visit which allows our clinical staff to assess your need for preventative services including immunizations, lifestyle education, counseling to decrease risk of preventable diseases and screening for fall risk and other medical concerns.    This visit is provided free of charge (no copay) for all Medicare recipients. The clinical pharmacists at [facility] have begun to conduct these Wellness Visits which will also include a thorough review of all your medications.    As you primary medical provider recommend that you make an appointment for your Annual Wellness Visit if you have not done so already this year.  You may set up this appointment before you leave today or you may call back ([phone]) and schedule an appointment.  Please make sure when you call that you mention that you are scheduling your Annual Wellness Visit with the clinical pharmacist so that the appointment may be made for the proper length of time.     Continue current medications. Continue good therapeutic lifestyle changes which include good diet and exercise. Fall precautions discussed with patient. If an FOBT was given today- please return it to our front desk. If you are over 61 years old - you may need Prevnar 7 or the adult Pneumonia vaccine.  **Flu shots are available--- please call and schedule a FLU-CLINIC appointment**  After your visit with [place] today you will receive a survey in the mail or online from [employer] regarding your care with [place]. Please take a moment to fill this out. Your feedback is very  important to [place] as you can help [place] better understand your patient needs as well as improve your experience and satisfaction. WE CARE ABOUT YOU!!!   Follow-up with gastroenterology as planned

## 2018-02-22 ENCOUNTER — Encounter: Payer: Self-pay | Admitting: Cardiology

## 2018-02-22 ENCOUNTER — Encounter: Payer: Self-pay | Admitting: Family Medicine

## 2018-02-22 ENCOUNTER — Ambulatory Visit (INDEPENDENT_AMBULATORY_CARE_PROVIDER_SITE_OTHER): Payer: Medicare HMO | Admitting: Cardiology

## 2018-02-22 VITALS — BP 122/80 | HR 88 | Ht 60.0 in | Wt 119.0 lb

## 2018-02-22 DIAGNOSIS — N1832 Chronic kidney disease, stage 3b: Secondary | ICD-10-CM | POA: Insufficient documentation

## 2018-02-22 DIAGNOSIS — I491 Atrial premature depolarization: Secondary | ICD-10-CM | POA: Diagnosis not present

## 2018-02-22 DIAGNOSIS — I10 Essential (primary) hypertension: Secondary | ICD-10-CM

## 2018-02-22 DIAGNOSIS — N183 Chronic kidney disease, stage 3 (moderate): Secondary | ICD-10-CM

## 2018-02-22 LAB — THYROID PANEL WITH TSH
Free Thyroxine Index: 2.9 (ref 1.2–4.9)
T3 UPTAKE RATIO: 28 % (ref 24–39)
T4 TOTAL: 10.4 ug/dL (ref 4.5–12.0)
TSH: 0.991 u[IU]/mL (ref 0.450–4.500)

## 2018-02-22 LAB — HEPATIC FUNCTION PANEL
ALK PHOS: 111 IU/L (ref 39–117)
ALT: 11 IU/L (ref 0–32)
AST: 22 IU/L (ref 0–40)
Albumin: 4.5 g/dL (ref 3.5–4.8)
Bilirubin Total: 0.6 mg/dL (ref 0.0–1.2)
Bilirubin, Direct: 0.17 mg/dL (ref 0.00–0.40)
TOTAL PROTEIN: 7.4 g/dL (ref 6.0–8.5)

## 2018-02-22 LAB — BMP8+EGFR
BUN / CREAT RATIO: 14 (ref 12–28)
BUN: 18 mg/dL (ref 8–27)
CHLORIDE: 94 mmol/L — AB (ref 96–106)
CO2: 28 mmol/L (ref 20–29)
Calcium: 9.6 mg/dL (ref 8.7–10.3)
Creatinine, Ser: 1.3 mg/dL — ABNORMAL HIGH (ref 0.57–1.00)
GFR calc non Af Amer: 39 mL/min/{1.73_m2} — ABNORMAL LOW (ref >59)
GFR, EST AFRICAN AMERICAN: 45 mL/min/{1.73_m2} — AB (ref >59)
GLUCOSE: 98 mg/dL (ref 65–99)
POTASSIUM: 3.4 mmol/L — AB (ref 3.5–5.2)
SODIUM: 142 mmol/L (ref 134–144)

## 2018-02-22 LAB — CBC WITH DIFFERENTIAL/PLATELET
BASOS ABS: 0 10*3/uL (ref 0.0–0.2)
Basos: 1 %
EOS (ABSOLUTE): 0.1 10*3/uL (ref 0.0–0.4)
Eos: 2 %
HEMOGLOBIN: 12.9 g/dL (ref 11.1–15.9)
Hematocrit: 39.1 % (ref 34.0–46.6)
Immature Grans (Abs): 0 10*3/uL (ref 0.0–0.1)
Immature Granulocytes: 0 %
LYMPHS ABS: 2.3 10*3/uL (ref 0.7–3.1)
Lymphs: 34 %
MCH: 30.5 pg (ref 26.6–33.0)
MCHC: 33 g/dL (ref 31.5–35.7)
MCV: 92 fL (ref 79–97)
MONOCYTES: 10 %
MONOS ABS: 0.7 10*3/uL (ref 0.1–0.9)
NEUTROS ABS: 3.7 10*3/uL (ref 1.4–7.0)
Neutrophils: 53 %
Platelets: 298 10*3/uL (ref 150–379)
RBC: 4.23 x10E6/uL (ref 3.77–5.28)
RDW: 14 % (ref 12.3–15.4)
WBC: 6.9 10*3/uL (ref 3.4–10.8)

## 2018-02-22 LAB — LIPID PANEL
CHOL/HDL RATIO: 3.3 ratio (ref 0.0–4.4)
CHOLESTEROL TOTAL: 155 mg/dL (ref 100–199)
HDL: 47 mg/dL (ref >39)
LDL CALC: 75 mg/dL (ref 0–99)
Triglycerides: 167 mg/dL — ABNORMAL HIGH (ref 0–149)
VLDL CHOLESTEROL CAL: 33 mg/dL (ref 5–40)

## 2018-02-22 LAB — VITAMIN D 25 HYDROXY (VIT D DEFICIENCY, FRACTURES): Vit D, 25-Hydroxy: 28.1 ng/mL — ABNORMAL LOW (ref 30.0–100.0)

## 2018-02-22 MED ORDER — METOPROLOL TARTRATE 25 MG PO TABS: 25.0000 mg | ORAL_TABLET | Freq: Two times a day (BID) | ORAL | 0 refills | Status: DC | PRN

## 2018-02-22 NOTE — Patient Instructions (Signed)
Medication Instructions:  The current medical regimen is effective;  continue present plan and medications.  Follow-Up: Follow up as needed with Dr Hochrein.  Thank you for choosing [facility]!!     

## 2018-02-24 DIAGNOSIS — E039 Hypothyroidism, unspecified: Secondary | ICD-10-CM | POA: Diagnosis not present

## 2018-02-24 DIAGNOSIS — L299 Pruritus, unspecified: Secondary | ICD-10-CM | POA: Diagnosis not present

## 2018-02-24 DIAGNOSIS — I10 Essential (primary) hypertension: Secondary | ICD-10-CM | POA: Diagnosis not present

## 2018-02-24 DIAGNOSIS — R69 Illness, unspecified: Secondary | ICD-10-CM | POA: Diagnosis not present

## 2018-02-24 DIAGNOSIS — G8929 Other chronic pain: Secondary | ICD-10-CM | POA: Diagnosis not present

## 2018-02-24 DIAGNOSIS — K5900 Constipation, unspecified: Secondary | ICD-10-CM | POA: Diagnosis not present

## 2018-02-24 DIAGNOSIS — K210 Gastro-esophageal reflux disease with esophagitis: Secondary | ICD-10-CM | POA: Diagnosis not present

## 2018-02-24 DIAGNOSIS — I471 Supraventricular tachycardia: Secondary | ICD-10-CM | POA: Diagnosis not present

## 2018-02-24 DIAGNOSIS — E785 Hyperlipidemia, unspecified: Secondary | ICD-10-CM | POA: Diagnosis not present

## 2018-03-03 ENCOUNTER — Other Ambulatory Visit: Payer: Self-pay | Admitting: *Deleted

## 2018-03-03 MED ORDER — OXAZEPAM 15 MG PO CAPS: ORAL_CAPSULE | ORAL | 1 refills | Status: DC

## 2018-04-04 ENCOUNTER — Ambulatory Visit: Payer: Medicare HMO | Admitting: Family Medicine

## 2018-04-04 DIAGNOSIS — R1311 Dysphagia, oral phase: Secondary | ICD-10-CM | POA: Diagnosis not present

## 2018-04-05 ENCOUNTER — Ambulatory Visit (INDEPENDENT_AMBULATORY_CARE_PROVIDER_SITE_OTHER): Payer: Medicare HMO | Admitting: Family Medicine

## 2018-04-05 ENCOUNTER — Encounter: Payer: Self-pay | Admitting: Family Medicine

## 2018-04-05 VITALS — BP 128/69 | HR 97 | Temp 97.7°F | Ht 60.0 in | Wt 122.0 lb

## 2018-04-05 DIAGNOSIS — R109 Unspecified abdominal pain: Secondary | ICD-10-CM

## 2018-04-05 DIAGNOSIS — R600 Localized edema: Secondary | ICD-10-CM | POA: Diagnosis not present

## 2018-04-05 LAB — URINALYSIS, COMPLETE
Bilirubin, UA: NEGATIVE
GLUCOSE, UA: NEGATIVE
KETONES UA: NEGATIVE
Leukocytes, UA: NEGATIVE
NITRITE UA: NEGATIVE
Protein, UA: NEGATIVE
RBC, UA: NEGATIVE
SPEC GRAV UA: 1.015 (ref 1.005–1.030)
Urobilinogen, Ur: 0.2 mg/dL (ref 0.2–1.0)
pH, UA: 7 (ref 5.0–7.5)

## 2018-04-05 LAB — MICROSCOPIC EXAMINATION
RBC MICROSCOPIC, UA: NONE SEEN /HPF (ref 0–2)
Renal Epithel, UA: NONE SEEN /hpf

## 2018-04-05 NOTE — Patient Instructions (Signed)
Edema Edema is when you have too much fluid in your body or under your skin. Edema may make your legs, feet, and ankles swell up. Swelling is also common in looser tissues, like around your eyes. This is a common condition. It gets more common as you get older. There are many possible causes of edema. Eating too much salt (sodium) and being on your feet or sitting for a long time can cause edema in your legs, feet, and ankles. Hot weather may make edema worse. Edema is usually painless. Your skin may look swollen or shiny. Follow these instructions at home:  Keep the swollen body part raised (elevated) above the level of your heart when you are sitting or lying down.  Do not sit still or stand for a long time.  Do not wear tight clothes. Do not wear garters on your upper legs.  Exercise your legs. This can help the swelling go down.  Wear elastic bandages or support stockings as told by your doctor.  Eat a low-salt (low-sodium) diet to reduce fluid as told by your doctor.  Depending on the cause of your swelling, you may need to limit how much fluid you drink (fluid restriction).  Take over-the-counter and prescription medicines only as told by your doctor. Contact a doctor if:  Treatment is not working.  You have heart, liver, or kidney disease and have symptoms of edema.  You have sudden and unexplained weight gain. Get help right away if:  You have shortness of breath or chest pain.  You cannot breathe when you lie down.  You have pain, redness, or warmth in the swollen areas.  You have heart, liver, or kidney disease and get edema all of a sudden.  You have a fever and your symptoms get worse all of a sudden. Summary  Edema is when you have too much fluid in your body or under your skin.  Edema may make your legs, feet, and ankles swell up. Swelling is also common in looser tissues, like around your eyes.  Raise (elevate) the swollen body part above the level of your  heart when you are sitting or lying down.  Follow your doctor's instructions about diet and how much fluid you can drink (fluid restriction). This information is not intended to replace advice given to you by your health care provider. Make sure you discuss any questions you have with your health care provider. Document Released: 03/22/2008 Document Revised: 10/22/2016 Document Reviewed: 10/22/2016 Elsevier Interactive Patient Education  2017 [employer]vier Inc.  

## 2018-04-05 NOTE — Addendum Note (Signed)
Addended byCarrolyn Leigh on: 04/05/2018 04:42 PM   Modules accepted: Orders

## 2018-04-05 NOTE — Progress Notes (Signed)
Subjective: CC: leg swelling PCP: Chipper Herb, MD FYB:[id number] [name] is a 77 y.o. female presenting to clinic today for:  1.  Lower extremity edema This is a chronic problem for patient.  Though she notes that symptoms seem to be worse over the last several weeks.  She reports having seen her cardiologist recently and being told that this was not related to her heart.  She reports that she wakes up with lower extremity edema and that it is persistent throughout the day.  She notes that she does not add salt to food but that she does eat out and cites that it does not taste like the restaurant adds salt to food.  Denies frequent consumption of canned goods, frozen foods or prepackaged foods.  However, she does drink quite a bit of tomato juice.  She uses her hydrochlorothiazide and Lasix as directed.  She reports that she does not like using the compression hose because they make her hot.  She is never had a fitted compression hose and uses OTC version.  No associated shortness of breath, chest pain, orthopnea.  No known bleeds or history of anemia.  Does not take NSAIDs.  Patient had an echocardiogram performed in June 2018 which demonstrated normal systolic function with ejection fraction of 55 to 60%.  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  Mild regurgitation was appreciated within the mitral valve as well.   ROS: Per HPI  Allergies  Allergen Reactions  . Bisphosphonates Other (See Comments)    Difficulty swallowing   . Ciprofloxacin Nausea And Vomiting  . Codeine Nausea And Vomiting  . Sulfa Antibiotics Nausea And Vomiting   Past Medical History:  Diagnosis Date  . Anxiety states   . Breast cyst   . Cancer ([place])    skin  . Cataract   . Constipation   . Esophageal stricture   . Essential hypertension 12/18/2015    . Fracture, rib   . GERD (gastroesophageal reflux disease)   . Hiatal hernia   . Lower extremity edema 12/18/2015  . Osteoporosis   . Other and unspecified hyperlipidemia   . PAC (premature atrial contraction) 12/30/2015  . Palpitations 12/18/2015  . Unspecified hypothyroidism   . Vertebral fracture, osteoporotic (HCC)     Current Outpatient Medications:  .  acetaminophen (TYLENOL) 500 MG tablet, Take 500 mg by mouth every 8 (eight) hours as needed for mild pain or moderate pain., Disp: , Rfl:  .  albuterol (PROVENTIL HFA;VENTOLIN HFA) 108 (90 Base) MCG/ACT inhaler, Inhale 2 puffs into the lungs every 6 (six) hours as needed for wheezing or shortness of breath., Disp: 1 Inhaler, Rfl: 0 .  atorvastatin (LIPITOR) 80 MG tablet, TAKE 1 TABLET EVERY DAY (Patient taking differently: Take 40 mg by mouth every day at bedtime), Disp: 90 tablet, Rfl: 1 .  Calcium-Phosphorus-Vitamin D ([place]) [phone] MG-MG-UNIT CHEW, Chew 1 each by mouth 2 (two) times daily., Disp: , Rfl:  .  Carboxymethylcellulose Sodium (LUBRICANT EYE DROPS OP), Place 1 drop into both eyes daily as needed (for dry eyes)., Disp: , Rfl:  .  Cholecalciferol (VITAMIN D3) 2000 units CHEW, Chew 4,000 Units by mouth daily. , Disp: , Rfl:  .  furosemide (LASIX) 20 MG tablet, TAKE ONE (1) TABLET EACH DAY (Patient taking differently: Take 20 mg by mouth each day), Disp:  90 tablet, Rfl: 3 .  hydrochlorothiazide (HYDRODIURIL) 25 MG tablet, Take 1 tablet (25 mg total) by mouth daily., Disp: 90 tablet, Rfl: 1 .  levothyroxine (SYNTHROID, LEVOTHROID) 100 MCG tablet, Take 50 mcg by mouth See admin instructions. Takes Mon through Fri. DOES NOT TAKE ON SAT and SUN (Patient taking differently: Take 50 mcg by mouth every Monday, Tuesday, Wednesday, Thursday, and Friday. ), Disp: 90 tablet, Rfl: 2 .  metoprolol tartrate (LOPRESSOR) 25 MG tablet, Take 1 tablet (25 mg total) by mouth 2 (two) times daily as needed., Disp: 180 tablet, Rfl:  0 .  Multiple Vitamin (MULTIVITAMIN WITH MINERALS) TABS tablet, Take 2 tablets by mouth daily. , Disp: , Rfl:  .  omeprazole (PRILOSEC) 40 MG capsule, Take 1 capsule by mouth daily., Disp: , Rfl:  .  oxazepam (SERAX) 15 MG capsule, TAKE ONE (1) CAPSULE THREE (3) TIMES EACH DAY, Disp: 90 capsule, Rfl: 1 .  polyethylene glycol (MIRALAX / GLYCOLAX) packet, Take 17 g by mouth daily as needed for moderate constipation. , Disp: , Rfl:  .  potassium chloride 20 MEQ/15ML (10%) SOLN, Take 10 mEq by mouth daily as needed (for cramping)., Disp: , Rfl:  .  sucralfate (CARAFATE) 1 g tablet, TAKE 1 TABLET TWICE DAILY, Disp: 180 tablet, Rfl: 1 Social History   Socioeconomic History  . Marital status: Married    Spouse name: Not on file  . Number of children: Not on file  . Years of education: Not on file  . Highest education level: Not on file  Occupational History  . Not on file  Social Needs  . Financial resource strain: Not on file  . Food insecurity:    Worry: Not on file    Inability: Not on file  . Transportation needs:    Medical: Not on file    Non-medical: Not on file  Tobacco Use  . Smoking status: Former Research scientist (life sciences)  . Smokeless tobacco: Never Used  . Tobacco comment: Non-smoker  quit 12-13 years  Substance and Sexual Activity  . Alcohol use: No  . Drug use: No  . Sexual activity: Never  Lifestyle  . Physical activity:    Days per week: Not on file    Minutes per session: Not on file  . Stress: Not on file  Relationships  . Social connections:    Talks on phone: Not on file    Gets together: Not on file    Attends religious service: Not on file    Active member of club or organization: Not on file    Attends meetings of clubs or organizations: Not on file    Relationship status: Not on file  . Intimate partner violence:    Fear of current or ex partner: Not on file    Emotionally abused: Not on file    Physically abused: Not on file    Forced sexual activity: Not on file   Other Topics Concern  . Not on file  Social History Narrative  . Not on file   Family History  Problem Relation Age of Onset  . Goiter Father   . Alcohol abuse Father   . Heart disease Sister   . Cancer Sister        breast and skin  . Hernia Brother   . Cancer Brother        throat  . Cancer Brother   . Heart disease Brother   . Early death Brother  auto accident    Objective: Office vital signs reviewed. BP 128/69   Pulse 97   Temp 97.7 F (36.5 C) (Oral)   Ht 5' (1.524 m)   Wt 122 lb (55.3 kg)   BMI 23.83 kg/m   Physical Examination:  General: Awake, alert, well nourished, well appearing, No acute distress HEENT: Normal    Neck: No masses palpated. No lymphadenopathy; no JVD Cardio: regular rate and rhythm, S1S2 heard, no murmurs appreciated Pulm: clear to auscultation bilaterally, no wheezes, rhonchi or rales; normal work of breathing on room air Extremities: warm, well perfused, 1+ pitting edema to lower shins bilaterally. No cyanosis or clubbing; +2 pulses bilaterally MSK: normal gait and normal station Skin: dry; intact; no rashes or lesions  Assessment/ Plan: 77 y.o. female   1. Bilateral lower extremity edema Recent cardiology visit and echocardiogram from 2018 reviewed.  No evidence of systolic dysfunction on that study.  No evidence of fluid overload on exam except for lower extremity edema.  I reviewed her labs from May, which did reveal an AKI with creatinine of 1.3.  Unsure if this is a new baseline for patient.  Recommended that she follow-up for recheck with PCP as directed.  No evidence of anemia on CBC.  TSH was within normal limits.  Lower extremity edema is likely related to venous stasis and diet.  We discussed reduction in salt.  I have prescribed her compression hose.  A written prescription was provided today.  Recommended elevation of lower extremities when seated.  However, ideally remaining active will promote reduction in lower extreme  any edema.  She has a follow-up with PCP scheduled.   Janora Norlander, DO [facility] [phone]

## 2018-04-06 DIAGNOSIS — R600 Localized edema: Secondary | ICD-10-CM | POA: Diagnosis not present

## 2018-05-10 ENCOUNTER — Ambulatory Visit (INDEPENDENT_AMBULATORY_CARE_PROVIDER_SITE_OTHER): Payer: Medicare HMO

## 2018-05-10 ENCOUNTER — Encounter: Payer: Self-pay | Admitting: Family Medicine

## 2018-05-10 ENCOUNTER — Ambulatory Visit (INDEPENDENT_AMBULATORY_CARE_PROVIDER_SITE_OTHER): Payer: Medicare HMO | Admitting: Family Medicine

## 2018-05-10 ENCOUNTER — Other Ambulatory Visit: Payer: Self-pay | Admitting: Family Medicine

## 2018-05-10 VITALS — BP 125/80 | HR 100 | Temp 97.0°F | Ht 60.0 in | Wt 121.0 lb

## 2018-05-10 DIAGNOSIS — S8012XA Contusion of left lower leg, initial encounter: Secondary | ICD-10-CM

## 2018-05-10 DIAGNOSIS — M1611 Unilateral primary osteoarthritis, right hip: Secondary | ICD-10-CM

## 2018-05-10 DIAGNOSIS — R52 Pain, unspecified: Secondary | ICD-10-CM

## 2018-05-10 DIAGNOSIS — M25551 Pain in right hip: Secondary | ICD-10-CM | POA: Diagnosis not present

## 2018-05-10 MED ORDER — OXAZEPAM 15 MG PO CAPS: ORAL_CAPSULE | ORAL | 1 refills | Status: DC

## 2018-05-10 MED ORDER — METHYLPREDNISOLONE ACETATE 80 MG/ML IJ SUSP
60.0000 mg | Freq: Once | INTRAMUSCULAR | Status: AC
  Administered 2018-05-10: 60 mg via INTRA_ARTICULAR

## 2018-05-10 NOTE — Progress Notes (Signed)
Subjective:    Patient ID: [name], female    DOB: Mar 01, 1941, 77 y.o.   MRN: [id number]  HPI  Patient here today for right hip pain that started about 2 weeks ago. She has had no injury.  The patient comes to the visit today complaining of right hip pain with no history of any injury.  It is painful with standing.  She also has a contusion of her left anterior leg.    Patient Active Problem List   Diagnosis Date Noted  . Chronic kidney disease (CKD) stage G3b/A1, moderately decreased glomerular filtration rate (GFR) between 30-44 mL/min/1.73 square meter and albuminuria creatinine ratio less than 30 mg/g (HCC) 02/22/2018  . Atherosclerosis of aorta ([place]) 02/21/2018  . PAC (premature atrial contraction) 12/30/2015  . Essential hypertension 12/18/2015  . Lower extremity edema 12/18/2015  . Palpitations 12/18/2015  . Hyperlipemia 02/14/2013  . Hypothyroid 02/14/2013  . Vitamin D deficiency 02/14/2013  . Generalized anxiety disorder 02/14/2013  . Constipation   . Esophageal stricture   . Osteoporosis with pathological fracture   . GERD (gastroesophageal reflux disease)    Outpatient Encounter Medications as of 05/10/2018  Medication Sig  . acetaminophen (TYLENOL) 500 MG tablet Take 500 mg by mouth every 8 (eight) hours as needed for mild pain or moderate pain.  Marland Kitchen albuterol (PROVENTIL HFA;VENTOLIN HFA) 108 (90 Base) MCG/ACT inhaler Inhale 2 puffs into the lungs every 6 (six) hours as needed for wheezing or shortness of breath.  Marland Kitchen atorvastatin (LIPITOR) 80 MG tablet TAKE 1 TABLET EVERY DAY (Patient taking differently: Take 40 mg by mouth every day at bedtime)  . Calcium-Phosphorus-Vitamin D ([place]) [phone] MG-MG-UNIT CHEW Chew 1 each by mouth 2 (two) times daily.  . Carboxymethylcellulose Sodium (LUBRICANT EYE DROPS OP) Place 1 drop into both eyes daily as needed (for dry eyes).  . Cholecalciferol (VITAMIN D3) 2000 units CHEW Chew 4,000 Units by mouth  daily.   . furosemide (LASIX) 20 MG tablet TAKE ONE (1) TABLET EACH DAY (Patient taking differently: Take 20 mg by mouth each day)  . hydrochlorothiazide (HYDRODIURIL) 25 MG tablet Take 1 tablet (25 mg total) by mouth daily.  Marland Kitchen levothyroxine (SYNTHROID, LEVOTHROID) 100 MCG tablet Take 50 mcg by mouth See admin instructions. Takes Mon through Fri. DOES NOT TAKE ON SAT and SUN (Patient taking differently: Take 50 mcg by mouth every Monday, Tuesday, Wednesday, Thursday, and Friday. )  . metoprolol tartrate (LOPRESSOR) 25 MG tablet Take 1 tablet (25 mg total) by mouth 2 (two) times daily as needed.  . Multiple Vitamin (MULTIVITAMIN WITH MINERALS) TABS tablet Take 2 tablets by mouth daily.   Marland Kitchen omeprazole (PRILOSEC) 40 MG capsule Take 1 capsule by mouth daily.  Marland Kitchen oxazepam (SERAX) 15 MG capsule TAKE ONE (1) CAPSULE THREE (3) TIMES EACH DAY  . polyethylene glycol (MIRALAX / GLYCOLAX) packet Take 17 g by mouth daily as needed for moderate constipation.   . potassium chloride 20 MEQ/15ML (10%) SOLN Take 10 mEq by mouth daily as needed (for cramping).  . sucralfate (CARAFATE) 1 g tablet TAKE 1 TABLET TWICE DAILY   No facility-administered encounter medications on file as of 05/10/2018.      Review of Systems  Constitutional: Negative.   HENT: Negative.   Eyes: Negative.   Respiratory: Negative.   Cardiovascular: Negative.   Gastrointestinal: Negative.   Endocrine: Negative.   Genitourinary: Negative.   Musculoskeletal: Negative.  Arthralgias: right hip pain.  Skin: Negative.   Allergic/Immunologic: Negative.  Neurological: Negative.   Hematological: Negative.   Psychiatric/Behavioral: Negative.        Objective:   Physical Exam  Constitutional: She is oriented to person, place, and time. She appears well-developed and well-nourished. No distress.  HENT:  Head: Normocephalic and atraumatic.  Eyes: Pupils are equal, round, and reactive to light. Conjunctivae and EOM are normal. Right eye  exhibits no discharge. Left eye exhibits no discharge. No scleral icterus.  Neck: Normal range of motion.  Musculoskeletal: Normal range of motion. She exhibits tenderness. She exhibits no edema or deformity.  Tender right posterior hip.  60 of Depo-Medrol injected to point tenderness area after cleansing with Betadine and a pressure dressing was applied.  Neurological: She is alert and oriented to person, place, and time. She has normal reflexes.  Skin: Skin is warm and dry. No rash noted.  Contusion of left anterior leg--  Psychiatric: She has a normal mood and affect. Her behavior is normal. Judgment and thought content normal.  Nursing note and vitals reviewed.  BP 125/80 (BP Location: Left Arm)   Pulse 100   Temp (!) 97 F (36.1 C) (Oral)   Ht 5' (1.524 m)   Wt 121 lb (54.9 kg)   BMI 23.63 kg/m   60mg  of Depo-Medrol injected into area of point tenderness in right posterior hip without problems.  Patient tolerated procedure well  X-ray of right hip with results pending====      Assessment & Plan:  1. Contusion of left lower extremity, initial encounter -Reassure  2. Primary osteoarthritis of right hip -60 mg Depo-Medrol injected into the area of point tenderness after cleansing with Betadine solution  3.  Anxiety -Serax refilled  No orders of the defined types were placed in this encounter.  Meds ordered this encounter  Medications  . oxazepam (SERAX) 15 MG capsule    Sig: TAKE ONE (1) CAPSULE THREE (3) TIMES EACH DAY    Dispense:  90 capsule    Refill:  1   Patient Instructions  The shot you received today may take several days to become effective In the meantime take extra strength Tylenol for pain Use warm wet compresses 20 minutes 3 or 4 times daily We will call with x-ray results as soon as those results become available  Arrie Senate MD

## 2018-05-10 NOTE — Patient Instructions (Signed)
The shot you received today may take several days to become effective In the meantime take extra strength Tylenol for pain Use warm wet compresses 20 minutes 3 or 4 times daily We will call with x-ray results as soon as those results become available

## 2018-05-10 NOTE — Addendum Note (Signed)
Addended by: Zannie Cove on: 05/10/2018 03:06 PM   Modules accepted: Orders

## 2018-06-09 ENCOUNTER — Encounter: Payer: Self-pay | Admitting: *Deleted

## 2018-06-22 ENCOUNTER — Ambulatory Visit: Payer: Medicare HMO | Admitting: Family Medicine

## 2018-06-27 ENCOUNTER — Encounter: Payer: Self-pay | Admitting: Family Medicine

## 2018-06-27 ENCOUNTER — Ambulatory Visit (INDEPENDENT_AMBULATORY_CARE_PROVIDER_SITE_OTHER): Payer: Medicare HMO | Admitting: Family Medicine

## 2018-06-27 VITALS — BP 111/78 | HR 90 | Temp 97.6°F | Ht 60.0 in | Wt 122.0 lb

## 2018-06-27 DIAGNOSIS — I10 Essential (primary) hypertension: Secondary | ICD-10-CM | POA: Diagnosis not present

## 2018-06-27 DIAGNOSIS — R69 Illness, unspecified: Secondary | ICD-10-CM | POA: Diagnosis not present

## 2018-06-27 DIAGNOSIS — E559 Vitamin D deficiency, unspecified: Secondary | ICD-10-CM | POA: Diagnosis not present

## 2018-06-27 DIAGNOSIS — K222 Esophageal obstruction: Secondary | ICD-10-CM

## 2018-06-27 DIAGNOSIS — E039 Hypothyroidism, unspecified: Secondary | ICD-10-CM

## 2018-06-27 DIAGNOSIS — E7800 Pure hypercholesterolemia, unspecified: Secondary | ICD-10-CM

## 2018-06-27 DIAGNOSIS — K2100 Gastro-esophageal reflux disease with esophagitis, without bleeding: Secondary | ICD-10-CM

## 2018-06-27 DIAGNOSIS — D692 Other nonthrombocytopenic purpura: Secondary | ICD-10-CM

## 2018-06-27 DIAGNOSIS — F411 Generalized anxiety disorder: Secondary | ICD-10-CM

## 2018-06-27 DIAGNOSIS — I700 Atherosclerosis of aorta: Secondary | ICD-10-CM | POA: Diagnosis not present

## 2018-06-27 MED ORDER — OXAZEPAM 15 MG PO CAPS: ORAL_CAPSULE | ORAL | 5 refills | Status: DC

## 2018-06-27 NOTE — Patient Instructions (Addendum)
Medicare Annual Wellness Visit  [facility] and the medical providers at [facility] strive to bring you the best medical care.  In doing so we not only want to address your current medical conditions and concerns but also to detect new conditions early and prevent illness, disease and health-related problems.    Medicare offers a yearly Wellness Visit which allows our clinical staff to assess your need for preventative services including immunizations, lifestyle education, counseling to decrease risk of preventable diseases and screening for fall risk and other medical concerns.    This visit is provided free of charge (no copay) for all Medicare recipients. The clinical pharmacists at [facility] have begun to conduct these Wellness Visits which will also include a thorough review of all your medications.    As you primary medical provider recommend that you make an appointment for your Annual Wellness Visit if you have not done so already this year.  You may set up this appointment before you leave today or you may call back ([phone]) and schedule an appointment.  Please make sure when you call that you mention that you are scheduling your Annual Wellness Visit with the clinical pharmacist so that the appointment may be made for the proper length of time.     Continue current medications. Continue good therapeutic lifestyle changes which include good diet and exercise. Fall precautions discussed with patient. If an FOBT was given today- please return it to our front desk. If you are over 37 years old - you may need Prevnar 37 or the adult Pneumonia vaccine.  **Flu shots are available--- please call and schedule a FLU-CLINIC appointment**  After your visit with [place] today you will receive a survey in the mail or online from [employer] regarding your care with [place]. Please take a moment to fill this out. Your feedback is very  important to [place] as you can help [place] better understand your patient needs as well as improve your experience and satisfaction. WE CARE ABOUT YOU!!!   We will call with lab work results as soon as these results become available Follow-up with gastroenterology as planned Try to get a follow-up appointment sometime in December. Drink plenty of water and fluids and stay well-hydrated.

## 2018-06-27 NOTE — Progress Notes (Signed)
Subjective:    Patient ID: [name], female    DOB: 1941/04/14, 77 y.o.   MRN: [id number]  HPI Pt here for follow up and management of chronic medical problems which includes hyperlipidemia and hypertension. She is taking medication regularly.  [name] is doing well and has no complaints today.  She will return to the office Saturday morning for her lab work.  She is requesting a refill on her Serax.  Her vital signs are stable and her blood pressure is good and her BMI is 23.6.  The patient is doing well today and is in good spirits.  She did have an endoscopy and dilatation and went back to see the gastroenterologist in June and he says that he feels like he needs to see her back sooner so that she does not get so constricted as she was this time she will call back sometime in October to get a December appointment.  She denies any chest pain pressure tightness or shortness of breath.  She tries to chew her food well and is swallowing well currently.  She has no problems with nausea vomiting diarrhea blood in the stool or black tarry bowel movements.  She does take a proton pump inhibitor 40 mg once daily.  She is trying to be active and trying to be careful not to fall.  She did have a door slam against her right proximal forearm she has a couple skin tears for which we will put some Steri-Strips on the day.  She is passing her water without problems.    Patient Active Problem List   Diagnosis Date Noted  . Chronic kidney disease (CKD) stage G3b/A1, moderately decreased glomerular filtration rate (GFR) between 30-44 mL/min/1.73 square meter and albuminuria creatinine ratio less than 30 mg/g (HCC) 02/22/2018  . Atherosclerosis of aorta ([place]) 02/21/2018  . PAC (premature atrial contraction) 12/30/2015  . Essential hypertension 12/18/2015  . Lower extremity edema 12/18/2015  . Palpitations 12/18/2015  . Hyperlipemia 02/14/2013  . Hypothyroid 02/14/2013  . Vitamin D deficiency 02/14/2013  .  Generalized anxiety disorder 02/14/2013  . Constipation   . Esophageal stricture   . Osteoporosis with pathological fracture   . GERD (gastroesophageal reflux disease)    Outpatient Encounter Medications as of 06/27/2018  Medication Sig  . acetaminophen (TYLENOL) 500 MG tablet Take 500 mg by mouth every 8 (eight) hours as needed for mild pain or moderate pain.  Marland Kitchen albuterol (PROVENTIL HFA;VENTOLIN HFA) 108 (90 Base) MCG/ACT inhaler Inhale 2 puffs into the lungs every 6 (six) hours as needed for wheezing or shortness of breath.  Marland Kitchen atorvastatin (LIPITOR) 80 MG tablet TAKE 1 TABLET EVERY DAY (Patient taking differently: Take 40 mg by mouth every day at bedtime)  . Calcium-Phosphorus-Vitamin D ([facility]) [phone] MG-MG-UNIT CHEW Chew 1 each by mouth 2 (two) times daily.  . Carboxymethylcellulose Sodium (LUBRICANT EYE DROPS OP) Place 1 drop into both eyes daily as needed (for dry eyes).  . Cholecalciferol (VITAMIN D3) 2000 units CHEW Chew 4,000 Units by mouth daily.   . furosemide (LASIX) 20 MG tablet TAKE ONE (1) TABLET EACH DAY (Patient taking differently: Take 20 mg by mouth each day)  . hydrochlorothiazide (HYDRODIURIL) 25 MG tablet Take 1 tablet (25 mg total) by mouth daily.  Marland Kitchen levothyroxine (SYNTHROID, LEVOTHROID) 100 MCG tablet Take 50 mcg by mouth See admin instructions. Takes Mon through Fri. DOES NOT TAKE ON SAT and SUN (Patient taking differently: Take 50 mcg by mouth every Monday,  Tuesday, Wednesday, Thursday, and Friday. )  . metoprolol tartrate (LOPRESSOR) 25 MG tablet Take 1 tablet (25 mg total) by mouth 2 (two) times daily as needed.  . Multiple Vitamin (MULTIVITAMIN WITH MINERALS) TABS tablet Take 2 tablets by mouth daily.   Marland Kitchen omeprazole (PRILOSEC) 40 MG capsule Take 1 capsule by mouth daily.  Marland Kitchen oxazepam (SERAX) 15 MG capsule TAKE ONE (1) CAPSULE THREE (3) TIMES EACH DAY  . polyethylene glycol (MIRALAX / GLYCOLAX) packet Take 17 g by mouth daily as needed for  moderate constipation.   . potassium chloride 20 MEQ/15ML (10%) SOLN Take 10 mEq by mouth daily as needed (for cramping).  . sucralfate (CARAFATE) 1 g tablet TAKE 1 TABLET TWICE DAILY   No facility-administered encounter medications on file as of 06/27/2018.      Review of Systems  Constitutional: Negative.   HENT: Negative.   Eyes: Negative.   Respiratory: Negative.   Cardiovascular: Negative.   Gastrointestinal: Negative.   Endocrine: Negative.   Genitourinary: Negative.   Musculoskeletal: Negative.   Skin: Negative.   Allergic/Immunologic: Negative.   Neurological: Negative.   Hematological: Negative.   Psychiatric/Behavioral: Negative.        Objective:   Physical Exam  Constitutional: She is oriented to person, place, and time. She appears well-developed and well-nourished.  Patient is pleasant and talkative today.  She is always worried about her husband.  He is feeling well.  HENT:  Head: Normocephalic and atraumatic.  Right Ear: External ear normal.  Left Ear: External ear normal.  Nose: Nose normal.  Mouth/Throat: Oropharynx is clear and moist. No oropharyngeal exudate.  Dentures in place  Eyes: Pupils are equal, round, and reactive to light. Conjunctivae and EOM are normal. Right eye exhibits no discharge. Left eye exhibits no discharge. No scleral icterus.  Neck: Normal range of motion. Neck supple. No thyromegaly present.  No bruits thyromegaly or anterior cervical adenopathy  Cardiovascular: Normal rate, regular rhythm, normal heart sounds and intact distal pulses.  No murmur heard. Heart is regular at 72/min  Pulmonary/Chest: Effort normal. She has no wheezes. She has no rales.  Slightly distant breath sounds and may be a few inspiratory wheezes but sparse.  Abdominal: Soft. Bowel sounds are normal. She exhibits no mass. There is no tenderness.  No liver or spleen enlargement minimal epigastric tenderness.  No masses no bruits and no inguinal adenopathy.    Musculoskeletal: Normal range of motion. She exhibits deformity. She exhibits no edema.  Kyphotic posture  Lymphadenopathy:    She has no cervical adenopathy.  Neurological: She is alert and oriented to person, place, and time. She has normal reflexes. No cranial nerve deficit.  Lower extremity reflexes are equal bilaterally  Skin: Skin is warm and dry. No rash noted.  Increased bruising  Psychiatric: She has a normal mood and affect. Her behavior is normal. Judgment and thought content normal.  Mood affect and behavior are all normal.  Nursing note and vitals reviewed.  BP 111/78 (BP Location: Right Arm)   Pulse 90   Temp 97.6 F (36.4 C) (Oral)   Ht 5' (1.524 m)   Wt 122 lb (55.3 kg)   BMI 23.83 kg/m         Assessment & Plan:  1. Pure hypercholesterolemia -Continue with aggressive therapeutic lifestyle changes and atorvastatin pending results of lab work - CBC with Differential/Platelet; Future - Lipid panel; Future  2. Gastroesophageal reflux disease with esophagitis -Continue with omeprazole 40 mg daily and follow-up with  gastroenterologist as he requested so that he can manage the stricture you have had. - CBC with Differential/Platelet; Future - Hepatic function panel; Future  3. Vitamin D deficiency -Continue vitamin D replacement pending results of lab work - CBC with Differential/Platelet; Future - VITAMIN D 25 Hydroxy (Vit-D Deficiency, Fractures); Future  4. Hypothyroidism, unspecified type -Continue  thyroid replacement - CBC with Differential/Platelet; Future  5. Essential hypertension -Blood pressure is good today and she will continue with current treatment - BMP8+EGFR; Future - CBC with Differential/Platelet; Future - Hepatic function panel; Future  6. Atherosclerosis of aorta ([facility]) -Continue as aggressive therapeutic lifestyle changes as possible - CBC with Differential/Platelet; Future - Lipid panel; Future  7. Generalized anxiety  disorder -Continue with oxazepam or Serax as doing and as needed - CBC with Differential/Platelet; Future  8. Esophageal stricture -Follow-up with gastroenterology as planned  9. Senile purpura (HCC) -Increased bruising both arms.  Meds ordered this encounter  Medications  . oxazepam (SERAX) 15 MG capsule    Sig: TAKE ONE (1) CAPSULE THREE (3) TIMES EACH DAY    Dispense:  90 capsule    Refill:  5   Patient Instructions                       Medicare Annual Wellness Visit  [facility] and the medical providers at [facility] strive to bring you the best medical care.  In doing so we not only want to address your current medical conditions and concerns but also to detect new conditions early and prevent illness, disease and health-related problems.    Medicare offers a yearly Wellness Visit which allows our clinical staff to assess your need for preventative services including immunizations, lifestyle education, counseling to decrease risk of preventable diseases and screening for fall risk and other medical concerns.    This visit is provided free of charge (no copay) for all Medicare recipients. The clinical pharmacists at [facility] have begun to conduct these Wellness Visits which will also include a thorough review of all your medications.    As you primary medical provider recommend that you make an appointment for your Annual Wellness Visit if you have not done so already this year.  You may set up this appointment before you leave today or you may call back ([phone]) and schedule an appointment.  Please make sure when you call that you mention that you are scheduling your Annual Wellness Visit with the clinical pharmacist so that the appointment may be made for the proper length of time.     Continue current medications. Continue good therapeutic lifestyle changes which include good diet and exercise. Fall precautions discussed with  patient. If an FOBT was given today- please return it to our front desk. If you are over 55 years old - you may need Prevnar 37 or the adult Pneumonia vaccine.  **Flu shots are available--- please call and schedule a FLU-CLINIC appointment**  After your visit with [place] today you will receive a survey in the mail or online from [employer] regarding your care with [place]. Please take a moment to fill this out. Your feedback is very important to [place] as you can help [place] better understand your patient needs as well as improve your experience and satisfaction. WE CARE ABOUT YOU!!!   We will call with lab work results as soon as these results become available Follow-up with gastroenterology as planned Try to get a follow-up appointment  sometime in December. Drink plenty of water and fluids and stay well-hydrated.   Arrie Senate MD

## 2018-07-01 ENCOUNTER — Other Ambulatory Visit: Payer: Medicare HMO

## 2018-07-01 DIAGNOSIS — E7800 Pure hypercholesterolemia, unspecified: Secondary | ICD-10-CM | POA: Diagnosis not present

## 2018-07-01 DIAGNOSIS — K2100 Gastro-esophageal reflux disease with esophagitis, without bleeding: Secondary | ICD-10-CM

## 2018-07-01 DIAGNOSIS — I10 Essential (primary) hypertension: Secondary | ICD-10-CM | POA: Diagnosis not present

## 2018-07-01 DIAGNOSIS — E559 Vitamin D deficiency, unspecified: Secondary | ICD-10-CM

## 2018-07-01 DIAGNOSIS — E039 Hypothyroidism, unspecified: Secondary | ICD-10-CM

## 2018-07-01 DIAGNOSIS — I700 Atherosclerosis of aorta: Secondary | ICD-10-CM | POA: Diagnosis not present

## 2018-07-01 DIAGNOSIS — F411 Generalized anxiety disorder: Secondary | ICD-10-CM

## 2018-07-01 DIAGNOSIS — R69 Illness, unspecified: Secondary | ICD-10-CM | POA: Diagnosis not present

## 2018-07-03 LAB — BMP8+EGFR
BUN / CREAT RATIO: 9 — AB (ref 12–28)
BUN: 11 mg/dL (ref 8–27)
CHLORIDE: 98 mmol/L (ref 96–106)
CO2: 26 mmol/L (ref 20–29)
Calcium: 9.3 mg/dL (ref 8.7–10.3)
Creatinine, Ser: 1.18 mg/dL — ABNORMAL HIGH (ref 0.57–1.00)
GFR calc non Af Amer: 44 mL/min/{1.73_m2} — ABNORMAL LOW (ref >59)
GFR, EST AFRICAN AMERICAN: 51 mL/min/{1.73_m2} — AB (ref >59)
GLUCOSE: 95 mg/dL (ref 65–99)
POTASSIUM: 3.8 mmol/L (ref 3.5–5.2)
SODIUM: 143 mmol/L (ref 134–144)

## 2018-07-03 LAB — CBC WITH DIFFERENTIAL/PLATELET
BASOS: 1 %
Basophils Absolute: 0.1 10*3/uL (ref 0.0–0.2)
EOS (ABSOLUTE): 0.2 10*3/uL (ref 0.0–0.4)
Eos: 3 %
Hematocrit: 37.9 % (ref 34.0–46.6)
Hemoglobin: 11.8 g/dL (ref 11.1–15.9)
IMMATURE GRANS (ABS): 0 10*3/uL (ref 0.0–0.1)
Immature Granulocytes: 0 %
LYMPHS: 28 %
Lymphocytes Absolute: 1.7 10*3/uL (ref 0.7–3.1)
MCH: 28.8 pg (ref 26.6–33.0)
MCHC: 31.1 g/dL — ABNORMAL LOW (ref 31.5–35.7)
MCV: 92 fL (ref 79–97)
Monocytes Absolute: 0.6 10*3/uL (ref 0.1–0.9)
Monocytes: 10 %
NEUTROS ABS: 3.5 10*3/uL (ref 1.4–7.0)
NEUTROS PCT: 58 %
PLATELETS: 274 10*3/uL (ref 150–450)
RBC: 4.1 x10E6/uL (ref 3.77–5.28)
RDW: 12.7 % (ref 12.3–15.4)
WBC: 6 10*3/uL (ref 3.4–10.8)

## 2018-07-03 LAB — LIPID PANEL
CHOL/HDL RATIO: 2.8 ratio (ref 0.0–4.4)
Cholesterol, Total: 127 mg/dL (ref 100–199)
HDL: 46 mg/dL (ref >39)
LDL CALC: 58 mg/dL (ref 0–99)
TRIGLYCERIDES: 115 mg/dL (ref 0–149)
VLDL CHOLESTEROL CAL: 23 mg/dL (ref 5–40)

## 2018-07-03 LAB — HEPATIC FUNCTION PANEL
ALBUMIN: 4.1 g/dL (ref 3.5–4.8)
ALK PHOS: 130 IU/L — AB (ref 39–117)
ALT: 10 IU/L (ref 0–32)
AST: 21 IU/L (ref 0–40)
BILIRUBIN, DIRECT: 0.13 mg/dL (ref 0.00–0.40)
Bilirubin Total: 0.4 mg/dL (ref 0.0–1.2)
TOTAL PROTEIN: 6.8 g/dL (ref 6.0–8.5)

## 2018-07-03 LAB — VITAMIN D 25 HYDROXY (VIT D DEFICIENCY, FRACTURES): Vit D, 25-Hydroxy: 30.8 ng/mL (ref 30.0–100.0)

## 2018-07-29 ENCOUNTER — Other Ambulatory Visit: Payer: Medicare HMO

## 2018-07-29 DIAGNOSIS — R945 Abnormal results of liver function studies: Principal | ICD-10-CM

## 2018-07-29 DIAGNOSIS — R7989 Other specified abnormal findings of blood chemistry: Secondary | ICD-10-CM

## 2018-07-30 LAB — HEPATIC FUNCTION PANEL
ALBUMIN: 4.2 g/dL (ref 3.5–4.8)
ALT: 10 IU/L (ref 0–32)
AST: 19 IU/L (ref 0–40)
Alkaline Phosphatase: 113 IU/L (ref 39–117)
Bilirubin Total: 0.5 mg/dL (ref 0.0–1.2)
Bilirubin, Direct: 0.17 mg/dL (ref 0.00–0.40)
TOTAL PROTEIN: 6.6 g/dL (ref 6.0–8.5)

## 2018-08-01 ENCOUNTER — Ambulatory Visit (INDEPENDENT_AMBULATORY_CARE_PROVIDER_SITE_OTHER): Payer: Medicare HMO

## 2018-08-01 DIAGNOSIS — Z23 Encounter for immunization: Secondary | ICD-10-CM | POA: Diagnosis not present

## 2018-08-07 ENCOUNTER — Ambulatory Visit (INDEPENDENT_AMBULATORY_CARE_PROVIDER_SITE_OTHER): Payer: Medicare HMO | Admitting: Family Medicine

## 2018-08-07 ENCOUNTER — Encounter: Payer: Self-pay | Admitting: Family Medicine

## 2018-08-07 ENCOUNTER — Ambulatory Visit (INDEPENDENT_AMBULATORY_CARE_PROVIDER_SITE_OTHER): Payer: Medicare HMO

## 2018-08-07 VITALS — BP 135/83 | HR 117 | Temp 99.2°F | Ht 60.0 in | Wt 123.0 lb

## 2018-08-07 DIAGNOSIS — R059 Cough, unspecified: Secondary | ICD-10-CM

## 2018-08-07 DIAGNOSIS — R0602 Shortness of breath: Secondary | ICD-10-CM | POA: Diagnosis not present

## 2018-08-07 DIAGNOSIS — R05 Cough: Secondary | ICD-10-CM

## 2018-08-07 DIAGNOSIS — J209 Acute bronchitis, unspecified: Secondary | ICD-10-CM

## 2018-08-07 DIAGNOSIS — R058 Other specified cough: Secondary | ICD-10-CM

## 2018-08-07 MED ORDER — METHYLPREDNISOLONE ACETATE 80 MG/ML IJ SUSP
60.0000 mg | Freq: Once | INTRAMUSCULAR | Status: AC
  Administered 2018-08-07: 60 mg via INTRAMUSCULAR

## 2018-08-07 MED ORDER — AZITHROMYCIN 200 MG/5ML PO SUSR: ORAL | 0 refills | Status: DC

## 2018-08-07 MED ORDER — FUROSEMIDE 20 MG PO TABS: ORAL_TABLET | ORAL | 3 refills | Status: DC

## 2018-08-07 MED ORDER — ALBUTEROL SULFATE HFA 108 (90 BASE) MCG/ACT IN AERS: 2.0000 | INHALATION_SPRAY | Freq: Four times a day (QID) | RESPIRATORY_TRACT | 1 refills | Status: DC | PRN

## 2018-08-07 NOTE — Progress Notes (Signed)
Subjective:    Patient ID: [name], female    DOB: 1941-06-07, 77 y.o.   MRN: [id number]  HPI Patient here today for cough and congestion.  Patient's husband has also been coughing and coughing up yellow this sputum like the patient is doing she has not had any fever.  This started 3 days ago.  She did get a chest x-ray today and will have a CBC.  She has a low-grade temperature.  Patient is coughing up yellow sputum which was observed during the visit.  There is minimal air movement but she does not appear to be in any distress.  The patient has been feeling bad for about 3 days.  She denies any nausea or vomiting just soreness and cough and congestion and extra wheezing at nighttime.  No GI symptoms.   Patient Active Problem List   Diagnosis Date Noted  . Chronic kidney disease (CKD) stage G3b/A1, moderately decreased glomerular filtration rate (GFR) between 30-44 mL/min/1.73 square meter and albuminuria creatinine ratio less than 30 mg/g (HCC) 02/22/2018  . Atherosclerosis of aorta ([place]) 02/21/2018  . PAC (premature atrial contraction) 12/30/2015  . Essential hypertension 12/18/2015  . Lower extremity edema 12/18/2015  . Palpitations 12/18/2015  . Hyperlipemia 02/14/2013  . Hypothyroid 02/14/2013  . Vitamin D deficiency 02/14/2013  . Generalized anxiety disorder 02/14/2013  . Constipation   . Esophageal stricture   . Osteoporosis with pathological fracture   . GERD (gastroesophageal reflux disease)    Outpatient Encounter Medications as of 08/07/2018  Medication Sig  . acetaminophen (TYLENOL) 500 MG tablet Take 500 mg by mouth every 8 (eight) hours as needed for mild pain or moderate pain.  Marland Kitchen albuterol (PROVENTIL HFA;VENTOLIN HFA) 108 (90 Base) MCG/ACT inhaler Inhale 2 puffs into the lungs every 6 (six) hours as needed for wheezing or shortness of breath.  Marland Kitchen atorvastatin (LIPITOR) 80 MG tablet TAKE 1 TABLET EVERY DAY (Patient taking differently: Take 40 mg by mouth every day  at bedtime)  . Calcium-Phosphorus-Vitamin D ([facility]) [phone] MG-MG-UNIT CHEW Chew 1 each by mouth 2 (two) times daily.  . Carboxymethylcellulose Sodium (LUBRICANT EYE DROPS OP) Place 1 drop into both eyes daily as needed (for dry eyes).  . Cholecalciferol (VITAMIN D3) 2000 units CHEW Chew 4,000 Units by mouth daily.   . furosemide (LASIX) 20 MG tablet TAKE ONE (1) TABLET EACH DAY (Patient taking differently: Take 20 mg by mouth each day)  . hydrochlorothiazide (HYDRODIURIL) 25 MG tablet Take 1 tablet (25 mg total) by mouth daily.  Marland Kitchen levothyroxine (SYNTHROID, LEVOTHROID) 100 MCG tablet Take 50 mcg by mouth See admin instructions. Takes Mon through Fri. DOES NOT TAKE ON SAT and SUN (Patient taking differently: Take 50 mcg by mouth every Monday, Tuesday, Wednesday, Thursday, and Friday. )  . metoprolol tartrate (LOPRESSOR) 25 MG tablet Take 1 tablet (25 mg total) by mouth 2 (two) times daily as needed.  . Multiple Vitamin (MULTIVITAMIN WITH MINERALS) TABS tablet Take 2 tablets by mouth daily.   Marland Kitchen omeprazole (PRILOSEC) 40 MG capsule Take 1 capsule by mouth daily.  Marland Kitchen oxazepam (SERAX) 15 MG capsule TAKE ONE (1) CAPSULE THREE (3) TIMES EACH DAY  . polyethylene glycol (MIRALAX / GLYCOLAX) packet Take 17 g by mouth daily as needed for moderate constipation.   . potassium chloride 20 MEQ/15ML (10%) SOLN Take 10 mEq by mouth daily as needed (for cramping).  . sucralfate (CARAFATE) 1 g tablet TAKE 1 TABLET TWICE DAILY   No facility-administered  encounter medications on file as of 08/07/2018.       Review of Systems  Constitutional: Negative.   HENT: Positive for congestion (yellow ).   Eyes: Negative.   Respiratory: Positive for cough.   Cardiovascular: Negative.   Gastrointestinal: Negative.   Endocrine: Negative.   Genitourinary: Negative.   Musculoskeletal: Negative.   Skin: Negative.   Allergic/Immunologic: Negative.   Neurological: Negative.   Hematological: Negative.     Psychiatric/Behavioral: Negative.        Objective:   Physical Exam  Constitutional: She is oriented to person, place, and time. No distress.  The patient is in no acute distress but does complain of increased cough and congestion for a couple of days.  HENT:  Head: Normocephalic and atraumatic.  Right Ear: External ear normal.  Left Ear: External ear normal.  Nose: Nose normal.  Mouth/Throat: Oropharyngeal exudate present.  Yellow sputum posterior throat.  Nasal passages are clear.  Eyes: Pupils are equal, round, and reactive to light. Conjunctivae and EOM are normal. Right eye exhibits no discharge. Left eye exhibits no discharge. No scleral icterus.  Neck: Normal range of motion. Neck supple. No thyromegaly present.  No anterior cervical adenopathy  Cardiovascular: Normal rate, regular rhythm and normal heart sounds.  No murmur heard. Pulmonary/Chest: Effort normal. No respiratory distress. She has wheezes. She has no rales.  Decreased breath sounds bilaterally slight wheezing knees and congestion with coughing  Abdominal: Soft. Bowel sounds are normal. She exhibits no mass. There is no tenderness. There is no rebound.  Musculoskeletal: Normal range of motion. She exhibits no edema.  Small framed and moving all extremities well  Lymphadenopathy:    She has no cervical adenopathy.  Neurological: She is alert and oriented to person, place, and time. She has normal reflexes.  Skin: Skin is warm and dry. No rash noted.  Psychiatric: She has a normal mood and affect. Her behavior is normal. Judgment and thought content normal.  Patient's mood affect and behavior were all normal for her.  Nursing note and vitals reviewed.   BP 135/83 (BP Location: Left Arm)   Pulse (!) 117   Temp 99.2 F (37.3 C) (Oral)   Ht 5' (1.524 m)   Wt 123 lb (55.8 kg)   BMI 24.02 kg/m        Assessment & Plan:  1. Cough -Take Mucinex adult strength twice daily with a large glass of water liquid  preparation - CBC with Differential/Platelet - DG Chest 2 View; Future - methylPREDNISolone acetate (DEPO-MEDROL) injection 60 mg  2. Bronchitis with bronchospasm -Depo-Medrol 60 mg IM -Zithromax suspension adult dosing  3. Cough productive of purulent sputum -Take adult Mucinex liquid cough medicine as directed with a large glass of water  4. Shortness of breath -Use albuterol inhaler every 4-6 hours as needed for wheezing and chest tightness -Drink plenty of fluids and stay well-hydrated -Chest x-ray with results pending  Meds ordered this encounter  Medications  . furosemide (LASIX) 20 MG tablet    Sig: Take 20 mg by mouth each day    Dispense:  90 tablet    Refill:  3  . azithromycin (ZITHROMAX) 200 MG/5ML suspension    Sig: Take 12.6 ml on day 1, then 6.42ml on days 2-5.    Dispense:  40 mL    Refill:  0  . methylPREDNISolone acetate (DEPO-MEDROL) injection 60 mg  . albuterol (PROVENTIL HFA;VENTOLIN HFA) 108 (90 Base) MCG/ACT inhaler    Sig: Inhale 2 puffs  into the lungs every 6 (six) hours as needed for wheezing or shortness of breath.    Dispense:  1 Inhaler    Refill:  1   Patient Instructions  Take Mucinex liquid for adults 1 teaspoon twice daily Take Zithromax suspension as directed Drink plenty of fluids and stay well-hydrated Use albuterol inhaler as a rescue inhaler if needed for wheezing and tightness 1 puff every 4-6 hours Take Tylenol for aches pains and fever   Arrie Senate MD

## 2018-08-07 NOTE — Patient Instructions (Signed)
Take Mucinex liquid for adults 1 teaspoon twice daily Take Zithromax suspension as directed Drink plenty of fluids and stay well-hydrated Use albuterol inhaler as a rescue inhaler if needed for wheezing and tightness 1 puff every 4-6 hours Take Tylenol for aches pains and fever

## 2018-08-08 LAB — CBC WITH DIFFERENTIAL/PLATELET
BASOS ABS: 0.1 10*3/uL (ref 0.0–0.2)
Basos: 1 %
EOS (ABSOLUTE): 0.2 10*3/uL (ref 0.0–0.4)
Eos: 2 %
HEMOGLOBIN: 12.1 g/dL (ref 11.1–15.9)
Hematocrit: 36.6 % (ref 34.0–46.6)
Immature Grans (Abs): 0 10*3/uL (ref 0.0–0.1)
Immature Granulocytes: 0 %
LYMPHS ABS: 2 10*3/uL (ref 0.7–3.1)
Lymphs: 20 %
MCH: 29.6 pg (ref 26.6–33.0)
MCHC: 33.1 g/dL (ref 31.5–35.7)
MCV: 90 fL (ref 79–97)
MONOCYTES: 11 %
MONOS ABS: 1.1 10*3/uL — AB (ref 0.1–0.9)
NEUTROS ABS: 6.6 10*3/uL (ref 1.4–7.0)
Neutrophils: 66 %
Platelets: 269 10*3/uL (ref 150–450)
RBC: 4.09 x10E6/uL (ref 3.77–5.28)
RDW: 12.8 % (ref 12.3–15.4)
WBC: 10.1 10*3/uL (ref 3.4–10.8)

## 2018-08-28 IMAGING — DX DG CHEST 2V
2 series · 2 of 2 positions shown · non-contrast
Comparison: 08/22/2015

CLINICAL DATA: Pain after coughing.

EXAM:
CHEST  2 VIEW

[chest pa]
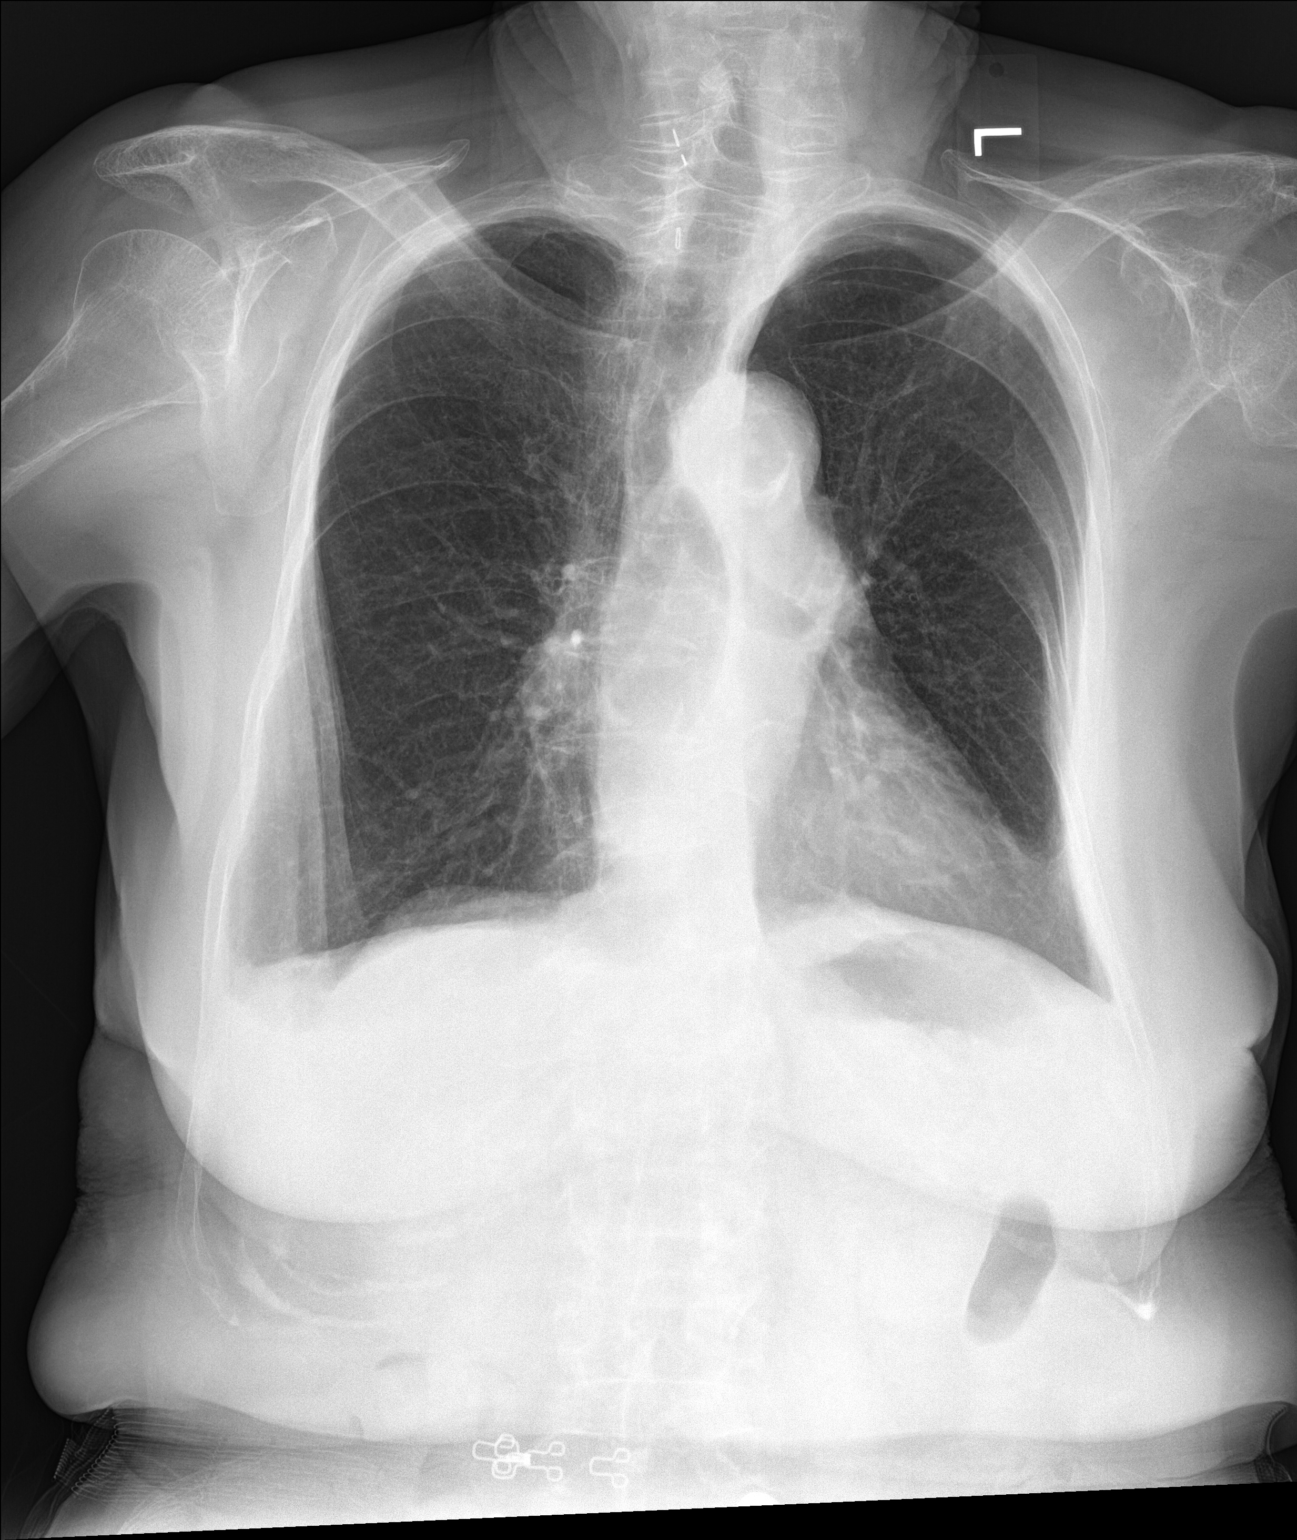

[chest lat]
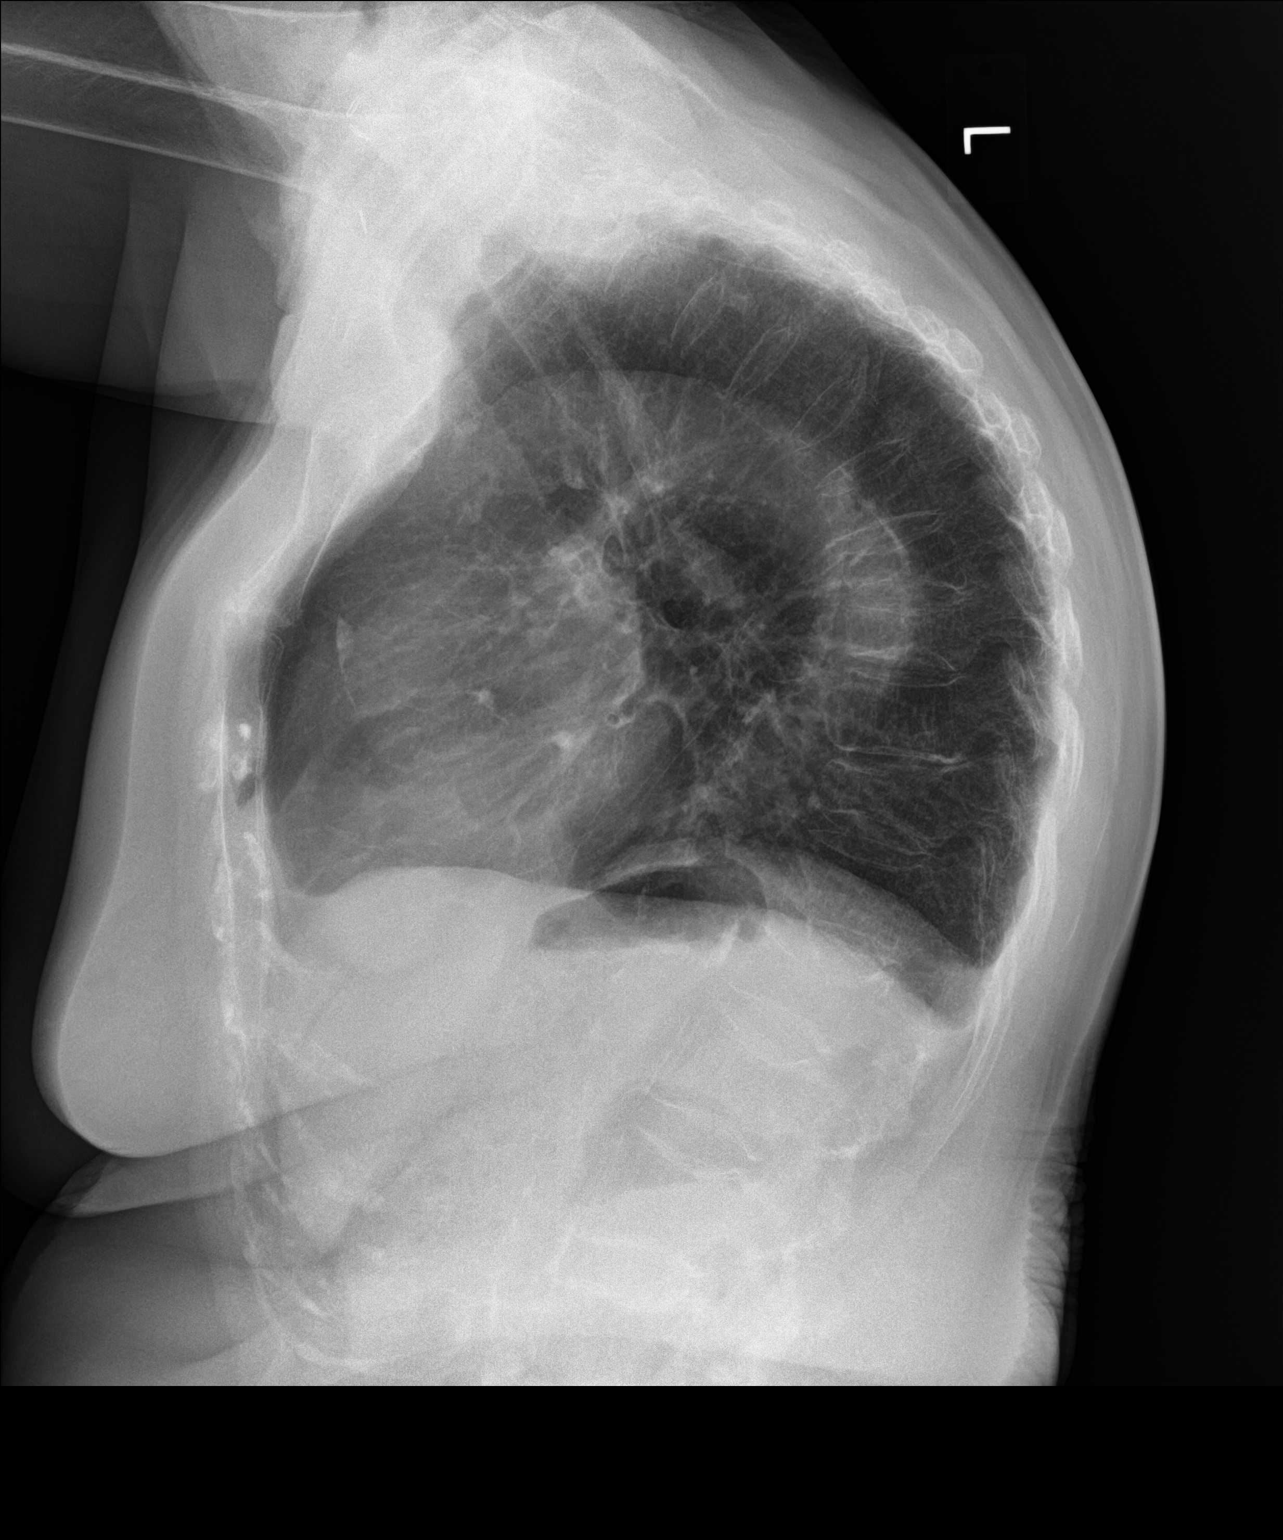

[2 of 2 positions shown; findings below may reference images not displayed]

FINDINGS: Cardiac silhouette is normal in size. No mediastinal or hilar masses
or evidence of adenopathy.

Clear lungs.  No pleural effusion.  No pneumothorax.

Skeletal structures are diffusely demineralized. There multiple
compression fractures/ deformities of the visualized spine. There
are 4 contiguous mid thoracic spine compression deformities, the
more inferior of which is new since the prior chest radiograph. The
other fractures are stable.
IMPRESSION: 1. No acute cardiopulmonary disease.
2. Multiple vertebral compression fractures, 1 along the mid
thoracic spine appears new from the prior chest radiograph and could
potentially be recent. This could be further assessed, if desired
clinically, with thoracic spine MRI.

## 2018-09-26 DIAGNOSIS — Z85828 Personal history of other malignant neoplasm of skin: Secondary | ICD-10-CM | POA: Diagnosis not present

## 2018-09-26 DIAGNOSIS — L821 Other seborrheic keratosis: Secondary | ICD-10-CM | POA: Diagnosis not present

## 2018-09-26 DIAGNOSIS — L570 Actinic keratosis: Secondary | ICD-10-CM | POA: Diagnosis not present

## 2018-11-08 ENCOUNTER — Ambulatory Visit (INDEPENDENT_AMBULATORY_CARE_PROVIDER_SITE_OTHER): Payer: Medicare HMO | Admitting: Family Medicine

## 2018-11-08 ENCOUNTER — Encounter: Payer: Self-pay | Admitting: Family Medicine

## 2018-11-08 VITALS — BP 147/90 | HR 96 | Temp 98.5°F | Ht 60.0 in | Wt 123.0 lb

## 2018-11-08 DIAGNOSIS — L989 Disorder of the skin and subcutaneous tissue, unspecified: Secondary | ICD-10-CM | POA: Diagnosis not present

## 2018-11-08 DIAGNOSIS — I10 Essential (primary) hypertension: Secondary | ICD-10-CM | POA: Diagnosis not present

## 2018-11-08 DIAGNOSIS — E039 Hypothyroidism, unspecified: Secondary | ICD-10-CM

## 2018-11-08 DIAGNOSIS — K2100 Gastro-esophageal reflux disease with esophagitis, without bleeding: Secondary | ICD-10-CM

## 2018-11-08 DIAGNOSIS — I700 Atherosclerosis of aorta: Secondary | ICD-10-CM | POA: Diagnosis not present

## 2018-11-08 DIAGNOSIS — E559 Vitamin D deficiency, unspecified: Secondary | ICD-10-CM | POA: Diagnosis not present

## 2018-11-08 DIAGNOSIS — E7800 Pure hypercholesterolemia, unspecified: Secondary | ICD-10-CM

## 2018-11-08 DIAGNOSIS — F411 Generalized anxiety disorder: Secondary | ICD-10-CM | POA: Diagnosis not present

## 2018-11-08 NOTE — Patient Instructions (Addendum)
Medicare Annual Wellness Visit  [facility] and the medical providers at [facility] strive to bring you the best medical care.  In doing so we not only want to address your current medical conditions and concerns but also to detect new conditions early and prevent illness, disease and health-related problems.    Medicare offers a yearly Wellness Visit which allows our clinical staff to assess your need for preventative services including immunizations, lifestyle education, counseling to decrease risk of preventable diseases and screening for fall risk and other medical concerns.    This visit is provided free of charge (no copay) for all Medicare recipients. The clinical pharmacists at [facility] have begun to conduct these Wellness Visits which will also include a thorough review of all your medications.    As you primary medical provider recommend that you make an appointment for your Annual Wellness Visit if you have not done so already this year.  You may set up this appointment before you leave today or you may call back ([phone]) and schedule an appointment.  Please make sure when you call that you mention that you are scheduling your Annual Wellness Visit with the clinical pharmacist so that the appointment may be made for the proper length of time.     Continue current medications. Continue good therapeutic lifestyle changes which include good diet and exercise. Fall precautions discussed with patient. If an FOBT was given today- please return it to our front desk. If you are over 67 years old - you may need Prevnar 49 or the adult Pneumonia vaccine.  **Flu shots are available--- please call and schedule a FLU-CLINIC appointment**  After your visit with [place] today you will receive a survey in the mail or online from [employer] regarding your care with [place]. Please take a moment to fill this out. Your feedback is very  important to [place] as you can help [place] better understand your patient needs as well as improve your experience and satisfaction. WE CARE ABOUT YOU!!!   Continue current treatment pending results of lab work Stay active physically Avoid crowds of people and use good hand hygiene everywhere you go Drink plenty of fluids and stay well-hydrated We will arrange to get an appointment for you to see Dr. Tarri Glenn because of the irritated skin lesion on the superior scalp.

## 2018-11-08 NOTE — Progress Notes (Signed)
Subjective:    Patient ID: [name], female    DOB: Dec 16, 1940, 78 y.o.   MRN: [id number]  HPI  Pt here for follow up and management of chronic medical problems which includes hypothyroid, hypertension and hyperlipidemia. She is taking medication regularly.  Planes of a lesion on her head that she is concerned about.  She was given an FOBT to return and will be scheduled for mammogram and will return to the office on Saturday for her lab work.  Today is somewhat hard of hearing.  She has a place or lesion on the top of her head that is been there for a while but seems to be getting more irritated and bigger in size.  We will need to do a referral for a biopsy and we will refer her to Dr. Nevada Crane in [place].  The patient today denies any chest pain or increasing shortness of breath.  She says that as long as she takes her omeprazole that she does not have any heartburn so she will continue with this.  She denies any blood in the stool or change in bowel habits.  She does complain of frequency but no burning in the course she does take a fluid pill.  She does have occasional swelling in her feet.  Her weight today is stable compared to previous readings.    Patient Active Problem List   Diagnosis Date Noted  . Chronic kidney disease (CKD) stage G3b/A1, moderately decreased glomerular filtration rate (GFR) between 30-44 mL/min/1.73 square meter and albuminuria creatinine ratio less than 30 mg/g (HCC) 02/22/2018  . Atherosclerosis of aorta ([place]) 02/21/2018  . PAC (premature atrial contraction) 12/30/2015  . Essential hypertension 12/18/2015  . Lower extremity edema 12/18/2015  . Palpitations 12/18/2015  . Hyperlipemia 02/14/2013  . Hypothyroid 02/14/2013  . Vitamin D deficiency 02/14/2013  . Generalized anxiety disorder 02/14/2013  . Constipation   . Esophageal stricture   . Osteoporosis with pathological fracture   . GERD (gastroesophageal reflux disease)    Outpatient Encounter  Medications as of 11/08/2018  Medication Sig  . acetaminophen (TYLENOL) 500 MG tablet Take 500 mg by mouth every 8 (eight) hours as needed for mild pain or moderate pain.  Marland Kitchen albuterol (PROVENTIL HFA;VENTOLIN HFA) 108 (90 Base) MCG/ACT inhaler Inhale 2 puffs into the lungs every 6 (six) hours as needed for wheezing or shortness of breath.  Marland Kitchen albuterol (PROVENTIL HFA;VENTOLIN HFA) 108 (90 Base) MCG/ACT inhaler Inhale 2 puffs into the lungs every 6 (six) hours as needed for wheezing or shortness of breath.  Marland Kitchen atorvastatin (LIPITOR) 80 MG tablet TAKE 1 TABLET EVERY DAY (Patient taking differently: Take 40 mg by mouth every day at bedtime)  . Calcium-Phosphorus-Vitamin D ([place]) [phone] MG-MG-UNIT CHEW Chew 1 each by mouth 2 (two) times daily.  . Carboxymethylcellulose Sodium (LUBRICANT EYE DROPS OP) Place 1 drop into both eyes daily as needed (for dry eyes).  . Cholecalciferol (VITAMIN D3) 2000 units CHEW Chew 4,000 Units by mouth daily.   . furosemide (LASIX) 20 MG tablet Take 20 mg by mouth each day  . hydrochlorothiazide (HYDRODIURIL) 25 MG tablet Take 1 tablet (25 mg total) by mouth daily.  Marland Kitchen levothyroxine (SYNTHROID, LEVOTHROID) 100 MCG tablet Take 50 mcg by mouth See admin instructions. Takes Mon through Fri. DOES NOT TAKE ON SAT and SUN (Patient taking differently: Take 50 mcg by mouth every Monday, Tuesday, Wednesday, Thursday, and Friday. )  . metoprolol tartrate (LOPRESSOR) 25 MG tablet Take 1  tablet (25 mg total) by mouth 2 (two) times daily as needed.  . Multiple Vitamin (MULTIVITAMIN WITH MINERALS) TABS tablet Take 2 tablets by mouth daily.   Marland Kitchen omeprazole (PRILOSEC) 40 MG capsule Take 1 capsule by mouth daily.  Marland Kitchen oxazepam (SERAX) 15 MG capsule TAKE ONE (1) CAPSULE THREE (3) TIMES EACH DAY  . polyethylene glycol (MIRALAX / GLYCOLAX) packet Take 17 g by mouth daily as needed for moderate constipation.   . potassium chloride 20 MEQ/15ML (10%) SOLN Take 10 mEq by mouth  daily as needed (for cramping).  . sucralfate (CARAFATE) 1 g tablet TAKE 1 TABLET TWICE DAILY  . [DISCONTINUED] azithromycin (ZITHROMAX) 200 MG/5ML suspension Take 12.6 ml on day 1, then 6.47m on days 2-5.   No facility-administered encounter medications on file as of 11/08/2018.      Review of Systems  Constitutional: Negative.   HENT: Negative.   Eyes: Negative.   Respiratory: Negative.   Cardiovascular: Negative.   Gastrointestinal: Negative.   Endocrine: Negative.   Genitourinary: Negative.   Musculoskeletal: Negative.   Skin: Negative.   Allergic/Immunologic: Negative.   Neurological: Negative.   Hematological: Negative.   Psychiatric/Behavioral: Negative.        Objective:   Physical Exam Vitals signs and nursing note reviewed.  Constitutional:      Appearance: Normal appearance. She is well-developed and normal weight. She is not ill-appearing.     Comments: Tiny framed and alert patient  HENT:     Head: Normocephalic and atraumatic.     Comments: Crusted skin lesion superior scalp that is constantly getting irritated    Right Ear: Tympanic membrane, ear canal and external ear normal. There is no impacted cerumen.     Left Ear: Tympanic membrane, ear canal and external ear normal. There is no impacted cerumen.     Nose: Nose normal. No congestion or rhinorrhea.     Mouth/Throat:     Mouth: Mucous membranes are moist.     Pharynx: Oropharynx is clear.     Comments: Edentulous Eyes:     General: No scleral icterus.       Right eye: No discharge.        Left eye: No discharge.     Conjunctiva/sclera: Conjunctivae normal.     Pupils: Pupils are equal, round, and reactive to light.  Neck:     Musculoskeletal: Normal range of motion and neck supple.     Thyroid: No thyromegaly.     Vascular: No carotid bruit or JVD.     Comments: No bruits thyromegaly or anterior cervical adenopathy Cardiovascular:     Rate and Rhythm: Normal rate and regular rhythm.     Heart  sounds: Normal heart sounds. No murmur.     Comments: Heart is regular at 72/min pulses are decreased in the lower extremities Pulmonary:     Effort: Pulmonary effort is normal.     Breath sounds: Normal breath sounds. No wheezing or rales.     Comments: Clear anteriorly and posteriorly Abdominal:     General: Bowel sounds are normal.     Palpations: Abdomen is soft. There is no mass.     Tenderness: There is abdominal tenderness.     Comments: Minimal generalized tenderness in the upper abdomen and no inguinal adenopathy  Musculoskeletal: Normal range of motion.        General: No tenderness.  Lymphadenopathy:     Cervical: No cervical adenopathy.  Skin:    General: Skin is warm and  dry.     Findings: Lesion present.     Comments: Dorsal scalp that is irritated and we will do referral to her dermatologist for this lesion and removal and pathology  Neurological:     General: No focal deficit present.     Mental Status: She is alert and oriented to person, place, and time. Mental status is at baseline.     Cranial Nerves: No cranial nerve deficit.     Deep Tendon Reflexes: Reflexes are normal and symmetric.  Psychiatric:        Mood and Affect: Mood normal.        Behavior: Behavior normal.        Thought Content: Thought content normal.        Judgment: Judgment normal.     Comments: Mood affect and behavior are normal for this patient    BP (!) 147/90   Pulse 96   Temp 98.5 F (36.9 C) (Oral)   Ht 5' (1.524 m)   Wt 123 lb (55.8 kg)   BMI 24.02 kg/m         Assessment & Plan:  1. Gastroesophageal reflux disease with esophagitis -Patient doing well this and will continue with omeprazole - CBC with Differential/Platelet; Future  2. Vitamin D deficiency -Continue with current treatment pending results of lab work - CBC with Differential/Platelet; Future - VITAMIN D 25 Hydroxy (Vit-D Deficiency, Fractures); Future  3. Hypothyroidism, unspecified type -Continue with  current treatment pending results of lab work - CBC with Differential/Platelet; Future - Thyroid Panel With TSH; Future  4. Essential hypertension -Blood pressure is good today and she will continue with current treatment - CBC with Differential/Platelet; Future - BMP8+EGFR; Future - Hepatic function panel; Future  5. Pure hypercholesterolemia -NU with as aggressive therapeutic lifestyle changes as possible - CBC with Differential/Platelet; Future - Lipid panel; Future  6. Atherosclerosis of aorta ([place]) -Continue with aggressive therapeutic lifestyle changes - CBC with Differential/Platelet; Future - Lipid panel; Future  7. Generalized anxiety disorder -Serax as needed  8. Skin lesion of scalp -Referral to dermatology  No orders of the defined types were placed in this encounter.  Patient Instructions                       Medicare Annual Wellness Visit  [facility] and the medical providers at [facility] strive to bring you the best medical care.  In doing so we not only want to address your current medical conditions and concerns but also to detect new conditions early and prevent illness, disease and health-related problems.    Medicare offers a yearly Wellness Visit which allows our clinical staff to assess your need for preventative services including immunizations, lifestyle education, counseling to decrease risk of preventable diseases and screening for fall risk and other medical concerns.    This visit is provided free of charge (no copay) for all Medicare recipients. The clinical pharmacists at [facility] have begun to conduct these Wellness Visits which will also include a thorough review of all your medications.    As you primary medical provider recommend that you make an appointment for your Annual Wellness Visit if you have not done so already this year.  You may set up this appointment before you leave today or you  may call back ([phone]) and schedule an appointment.  Please make sure when you call that you mention that you are scheduling your Annual Wellness Visit  with the clinical pharmacist so that the appointment may be made for the proper length of time.     Continue current medications. Continue good therapeutic lifestyle changes which include good diet and exercise. Fall precautions discussed with patient. If an FOBT was given today- please return it to our front desk. If you are over 63 years old - you may need Prevnar 13 or the adult Pneumonia vaccine.  **Flu shots are available--- please call and schedule a FLU-CLINIC appointment**  After your visit with [place] today you will receive a survey in the mail or online from [employer] regarding your care with [place]. Please take a moment to fill this out. Your feedback is very important to [place] as you can help [place] better understand your patient needs as well as improve your experience and satisfaction. WE CARE ABOUT YOU!!!   Continue current treatment pending results of lab work Stay active physically Avoid crowds of people and use good hand hygiene everywhere you go Drink plenty of fluids and stay well-hydrated We will arrange to get an appointment for you to see Dr. Tarri Glenn because of the irritated skin lesion on the superior scalp.  Arrie Senate MD

## 2018-11-08 NOTE — Addendum Note (Signed)
Addended by: Zannie Cove on: 11/08/2018 12:47 PM   Modules accepted: Orders

## 2018-11-09 NOTE — Addendum Note (Signed)
Addended by: Zannie Cove on: 11/09/2018 08:48 AM   Modules accepted: Orders

## 2018-11-11 ENCOUNTER — Other Ambulatory Visit: Payer: Medicare HMO

## 2018-11-11 DIAGNOSIS — K2100 Gastro-esophageal reflux disease with esophagitis, without bleeding: Secondary | ICD-10-CM

## 2018-11-11 DIAGNOSIS — E559 Vitamin D deficiency, unspecified: Secondary | ICD-10-CM | POA: Diagnosis not present

## 2018-11-11 DIAGNOSIS — I10 Essential (primary) hypertension: Secondary | ICD-10-CM | POA: Diagnosis not present

## 2018-11-11 DIAGNOSIS — E039 Hypothyroidism, unspecified: Secondary | ICD-10-CM

## 2018-11-11 DIAGNOSIS — E7800 Pure hypercholesterolemia, unspecified: Secondary | ICD-10-CM | POA: Diagnosis not present

## 2018-11-11 DIAGNOSIS — I700 Atherosclerosis of aorta: Secondary | ICD-10-CM | POA: Diagnosis not present

## 2018-11-13 ENCOUNTER — Other Ambulatory Visit: Payer: Self-pay | Admitting: *Deleted

## 2018-11-13 DIAGNOSIS — R7989 Other specified abnormal findings of blood chemistry: Secondary | ICD-10-CM

## 2018-11-13 LAB — BMP8+EGFR
BUN / CREAT RATIO: 14 (ref 12–28)
BUN: 20 mg/dL (ref 8–27)
CO2: 29 mmol/L (ref 20–29)
CREATININE: 1.43 mg/dL — AB (ref 0.57–1.00)
Calcium: 9.5 mg/dL (ref 8.7–10.3)
Chloride: 96 mmol/L (ref 96–106)
GFR calc Af Amer: 40 mL/min/{1.73_m2} — ABNORMAL LOW (ref >59)
GFR calc non Af Amer: 35 mL/min/{1.73_m2} — ABNORMAL LOW (ref >59)
Glucose: 101 mg/dL — ABNORMAL HIGH (ref 65–99)
Potassium: 4.1 mmol/L (ref 3.5–5.2)
Sodium: 142 mmol/L (ref 134–144)

## 2018-11-13 LAB — HEPATIC FUNCTION PANEL
ALBUMIN: 4.1 g/dL (ref 3.7–4.7)
ALT: 13 IU/L (ref 0–32)
AST: 18 IU/L (ref 0–40)
Alkaline Phosphatase: 108 IU/L (ref 39–117)
Bilirubin Total: 0.5 mg/dL (ref 0.0–1.2)
Bilirubin, Direct: 0.14 mg/dL (ref 0.00–0.40)
Total Protein: 7 g/dL (ref 6.0–8.5)

## 2018-11-13 LAB — CBC WITH DIFFERENTIAL/PLATELET
Basophils Absolute: 0.1 10*3/uL (ref 0.0–0.2)
Basos: 1 %
EOS (ABSOLUTE): 0.2 10*3/uL (ref 0.0–0.4)
Eos: 3 %
HEMATOCRIT: 39 % (ref 34.0–46.6)
Hemoglobin: 12.5 g/dL (ref 11.1–15.9)
IMMATURE GRANS (ABS): 0 10*3/uL (ref 0.0–0.1)
Immature Granulocytes: 0 %
Lymphocytes Absolute: 2.1 10*3/uL (ref 0.7–3.1)
Lymphs: 35 %
MCH: 30.5 pg (ref 26.6–33.0)
MCHC: 32.1 g/dL (ref 31.5–35.7)
MCV: 95 fL (ref 79–97)
Monocytes Absolute: 0.6 10*3/uL (ref 0.1–0.9)
Monocytes: 9 %
Neutrophils Absolute: 3.2 10*3/uL (ref 1.4–7.0)
Neutrophils: 52 %
Platelets: 294 10*3/uL (ref 150–450)
RBC: 4.1 x10E6/uL (ref 3.77–5.28)
RDW: 14 % (ref 11.7–15.4)
WBC: 6.1 10*3/uL (ref 3.4–10.8)

## 2018-11-13 LAB — LIPID PANEL
Chol/HDL Ratio: 2.6 ratio (ref 0.0–4.4)
Cholesterol, Total: 132 mg/dL (ref 100–199)
HDL: 51 mg/dL (ref >39)
LDL Calculated: 58 mg/dL (ref 0–99)
Triglycerides: 117 mg/dL (ref 0–149)
VLDL Cholesterol Cal: 23 mg/dL (ref 5–40)

## 2018-11-13 LAB — VITAMIN D 25 HYDROXY (VIT D DEFICIENCY, FRACTURES): Vit D, 25-Hydroxy: 25.6 ng/mL — ABNORMAL LOW (ref 30.0–100.0)

## 2018-11-13 LAB — THYROID PANEL WITH TSH
FREE THYROXINE INDEX: 2.8 (ref 1.2–4.9)
T3 UPTAKE RATIO: 29 % (ref 24–39)
T4 TOTAL: 9.5 ug/dL (ref 4.5–12.0)
TSH: 0.966 u[IU]/mL (ref 0.450–4.500)

## 2018-11-16 ENCOUNTER — Ambulatory Visit (INDEPENDENT_AMBULATORY_CARE_PROVIDER_SITE_OTHER): Payer: Medicare HMO | Admitting: *Deleted

## 2018-11-16 VITALS — BP 124/74 | HR 94 | Temp 98.2°F | Ht 60.0 in | Wt 123.0 lb

## 2018-11-16 DIAGNOSIS — Z0000 Encounter for general adult medical examination without abnormal findings: Secondary | ICD-10-CM

## 2018-11-16 NOTE — Progress Notes (Addendum)
Subjective:   [name] is a 78 y.o. female who presents for Medicare Annual (Subsequent) preventive examination. She is retired from a Musician. She enjoys traveling. She is not actively doing any kind of exercise. She states that she eats a pretty healthy diet and generally eats 2 meals a day. She is not active in church and she lives in a apartment with her husband, Alease Medina. They do not have any children or any pets. Fall hazards were discussed today. She states that her health is slightly worse than it was a year ago.        Objective:     Vitals: BP 124/74 (BP Location: Left Arm)   Pulse 94   Temp 98.2 F (36.8 C) (Oral)   Ht 5' (1.524 m)   Wt 123 lb (55.8 kg)   BMI 24.02 kg/m   Body mass index is 24.02 kg/m.  Advanced Directives 11/16/2018 01/19/2018 01/11/2018 08/16/2017 12/23/2016 05/28/2015  Does Patient Have a Medical Advance Directive? No No No No No No  Would patient like information on creating a medical advance directive? No - Patient declined No - Patient declined No - Patient declined No - Patient declined - Yes - Scientist, clinical (histocompatibility and immunogenetics) given    Tobacco Social History   Tobacco Use  Smoking Status Former Smoker  Smokeless Tobacco Never Used  Tobacco Comment   Non-smoker  quit 12-13 years     Counseling given: Not Answered Comment: Non-smoker  quit 12-13 years     Past Medical History:  Diagnosis Date  . Anxiety states   . Breast cyst   . Cancer ([place])    skin  . Cataract   . Constipation   . Esophageal stricture   . Essential hypertension 12/18/2015  . Fracture, rib   . GERD (gastroesophageal reflux disease)   . Hiatal hernia   . Lower extremity edema 12/18/2015  . Osteoporosis   . Other and unspecified hyperlipidemia   . PAC (premature atrial contraction) 12/30/2015  . Palpitations 12/18/2015  . Unspecified hypothyroidism   . Vertebral fracture, osteoporotic [facility])    Past Surgical History:  Procedure Laterality Date  . ABDOMINAL  HYSTERECTOMY    . APPENDECTOMY    . BREAST BIOPSY     bil cysts  . CATARACT EXTRACTION    . ESOPHAGOGASTRODUODENOSCOPY N/A 01/19/2018   Procedure: ESOPHAGOGASTRODUODENOSCOPY (EGD);  Surgeon: Clarene Essex, MD;  Location: [facility];  Service: Endoscopy;  Laterality: N/A;  with flouro  . EYE SURGERY    . HAND SURGERY Left   . IR GENERIC HISTORICAL  12/10/2016   IR RADIOLOGIST EVAL & MGMT 12/10/2016 [facility]  . IR GENERIC HISTORICAL  12/23/2016   IR KYPHO THORACIC WITH BONE BIOPSY 12/23/2016 Luanne Bras, MD [facility]  . PARTIAL HYSTERECTOMY    . THYROID LOBECTOMY     right  . VERTEBROPLASTY     Family History  Problem Relation Age of Onset  . Goiter Father   . Alcohol abuse Father   . Heart disease Sister   . Cancer Sister        breast and skin  . Goiter Brother   . Dementia Brother   . Hyperlipidemia Brother   . Hernia Brother   . Cancer Brother        throat  . Cancer Brother        neck  . Heart disease Brother   . Early death Brother        auto accident  Social History   Socioeconomic History  . Marital status: Married    Spouse name: elmer  . Number of children: 0  . Years of education: Not on file  . Highest education level: Not on file  Occupational History  . Occupation: retired    Comment: Musician  Social Needs  . Financial resource strain: Not on file  . Food insecurity:    Worry: Not on file    Inability: Not on file  . Transportation needs:    Medical: Not on file    Non-medical: Not on file  Tobacco Use  . Smoking status: Former Research scientist (life sciences)  . Smokeless tobacco: Never Used  . Tobacco comment: Non-smoker  quit 12-13 years  Substance and Sexual Activity  . Alcohol use: No  . Drug use: No  . Sexual activity: Never  Lifestyle  . Physical activity:    Days per week: Not on file    Minutes per session: Not on file  . Stress: Not on file  Relationships  . Social connections:    Talks on phone: Not on file    Gets together: Not  on file    Attends religious service: Not on file    Active member of club or organization: Not on file    Attends meetings of clubs or organizations: Not on file    Relationship status: Not on file  Other Topics Concern  . Not on file  Social History Narrative  . Not on file    Outpatient Encounter Medications as of 11/16/2018  Medication Sig  . acetaminophen (TYLENOL) 500 MG tablet Take 500 mg by mouth every 8 (eight) hours as needed for mild pain or moderate pain.  Marland Kitchen albuterol (PROVENTIL HFA;VENTOLIN HFA) 108 (90 Base) MCG/ACT inhaler Inhale 2 puffs into the lungs every 6 (six) hours as needed for wheezing or shortness of breath.  Marland Kitchen atorvastatin (LIPITOR) 80 MG tablet TAKE 1 TABLET EVERY DAY (Patient taking differently: Take 40 mg by mouth every day at bedtime)  . Calcium-Phosphorus-Vitamin D ([place]) [phone] MG-MG-UNIT CHEW Chew 1 each by mouth 2 (two) times daily.  . Carboxymethylcellulose Sodium (LUBRICANT EYE DROPS OP) Place 1 drop into both eyes daily as needed (for dry eyes).  . Cholecalciferol (VITAMIN D3) 2000 units CHEW Chew 4,000 Units by mouth daily.   . furosemide (LASIX) 20 MG tablet Take 20 mg by mouth each day  . hydrochlorothiazide (HYDRODIURIL) 25 MG tablet Take 1 tablet (25 mg total) by mouth daily.  Marland Kitchen levothyroxine (SYNTHROID, LEVOTHROID) 100 MCG tablet Take 50 mcg by mouth See admin instructions. Takes Mon through Fri. DOES NOT TAKE ON SAT and SUN (Patient taking differently: Take 50 mcg by mouth every Monday, Tuesday, Wednesday, Thursday, and Friday. )  . metoprolol tartrate (LOPRESSOR) 25 MG tablet Take 1 tablet (25 mg total) by mouth 2 (two) times daily as needed.  . Multiple Vitamin (MULTIVITAMIN WITH MINERALS) TABS tablet Take 2 tablets by mouth daily.   Marland Kitchen omeprazole (PRILOSEC) 40 MG capsule Take 1 capsule by mouth daily.  Marland Kitchen oxazepam (SERAX) 15 MG capsule TAKE ONE (1) CAPSULE THREE (3) TIMES EACH DAY  . polyethylene glycol (MIRALAX /  GLYCOLAX) packet Take 17 g by mouth daily as needed for moderate constipation.   . potassium chloride 20 MEQ/15ML (10%) SOLN Take 10 mEq by mouth daily as needed (for cramping).  . sucralfate (CARAFATE) 1 g tablet TAKE 1 TABLET TWICE DAILY  . [DISCONTINUED] albuterol (PROVENTIL HFA;VENTOLIN HFA) 108 (90 Base)  MCG/ACT inhaler Inhale 2 puffs into the lungs every 6 (six) hours as needed for wheezing or shortness of breath.   No facility-administered encounter medications on file as of 11/16/2018.     Activities of Daily Living In your present state of health, do you have any difficulty performing the following activities: 11/16/2018  Hearing? Y  Vision? N  Difficulty concentrating or making decisions? N  Walking or climbing stairs? N  Dressing or bathing? N  Doing errands, shopping? N  Preparing Food and eating ? N  Using the Toilet? N  In the past six months, have you accidently leaked urine? N  Do you have problems with loss of bowel control? N  Managing your Medications? N  Managing your Finances? N  Housekeeping or managing your Housekeeping? N  Some recent data might be hidden    Patient Care Team: Chipper Herb, MD as PCP - General (Family Medicine) Minus Breeding, MD (Cardiology) Clarene Essex, MD (Gastroenterology) Skeet Latch, MD as Attending Physician (Cardiology) Sandford Craze, MD as Referring Physician (Dermatology)    Assessment:   This is a routine wellness examination for Bradee.  Exercise Activities and Dietary recommendations Current Exercise Habits: The patient does not participate in regular exercise at present  Goals    . Exercise 150 minutes per week (moderate activity)    . Prevent falls       Fall Risk Fall Risk  11/16/2018 08/07/2018 06/27/2018 05/10/2018 04/05/2018  Falls in the past year? 0 No No No No  Number falls in past yr: - - - - -  Injury with Fall? - - - - -  Risk Factor Category  - - - - -  Follow up - - - - -   Is the  patient's home free of loose throw rugs in walkways, pet beds, electrical cords, etc? Fall hazards and risks were discussed today at length     Depression Screen PHQ 2/9 Scores 11/16/2018 08/07/2018 06/27/2018 05/10/2018  PHQ - 2 Score 0 0 0 0     Cognitive Function MMSE - Mini Mental State Exam 11/16/2018 08/16/2017 05/28/2015  Orientation to time 5 5 5   Orientation to Place 5 5 5   Registration 3 3 3   Attention/ Calculation 5 3 5   Recall 2 3 3   Language- name 2 objects 2 2 2   Language- repeat 1 1 1   Language- follow 3 step command 2 3 2   Language- read & follow direction 1 1 1   Write a sentence 1 1 1   Copy design 1 1 1   Total score 28 28 29         Immunization History  Administered Date(s) Administered  . Influenza Whole 06/18/2010  . Influenza, High Dose Seasonal PF 08/19/2016, 08/02/2017, 08/01/2018  . Influenza,inj,Quad PF,6+ Mos 07/23/2013, 07/17/2014, 08/06/2015  . Pneumococcal Conjugate-13 11/05/2013  . Pneumococcal Polysaccharide-23 10/18/2004  . Td 08/18/2010    Qualifies for Shingles Vaccine?declined   Screening Tests Health Maintenance  Topic Date Due  . MAMMOGRAM  10/24/2018  . DEXA SCAN  02/02/2019  . TETANUS/TDAP  08/18/2020  . INFLUENZA VACCINE  Completed  . PNA vac Low Risk Adult  Completed    Cancer Screenings: Lung: Low Dose CT Chest recommended if Age 74-80 years, 30 pack-year currently smoking OR have quit w/in 15years. Patient does not qualify. Breast:  Up to date on Mammogram? No   Up to date of Bone Density/Dexa? Yes Colorectal: given   Additional Screenings: declined  Hepatitis C Screening:  Plan:   pt is to keep follow up with Dr Laurance Flatten She is due for a mammogram and will work on scheduling this.  I have personally reviewed and noted the following in the patient's chart:   . Medical and social history . Use of alcohol, tobacco or illicit drugs  . Current medications and supplements . Functional ability and  status . Nutritional status . Physical activity . Advanced directives . List of other physicians . Hospitalizations, surgeries, and ER visits in previous 12 months . Vitals . Screenings to include cognitive, depression, and falls . Referrals and appointments  In addition, I have reviewed and discussed with patient certain preventive protocols, quality metrics, and best practice recommendations. A written personalized care plan for preventive services as well as general preventive health recommendations were provided to patient.     Jostin Rue, Cameron Proud, LPN  04/14/6380  I have reviewed and agree with the above AWV documentation.   Mary-Margaret Hassell Done, FNP

## 2018-11-16 NOTE — Patient Instructions (Signed)
  Ms.  , Thank you for taking time to come for your Medicare Wellness Visit. I appreciate your ongoing commitment to your health goals. Please review the following plan we discussed and let me know if I can assist you in the future.   These are the goals we discussed: Goals    . Exercise 150 minutes per week (moderate activity)    . Prevent falls       This is a list of the screening recommended for you and due dates:  Health Maintenance  Topic Date Due  . Mammogram  10/24/2018  . DEXA scan (bone density measurement)  02/02/2019  . Tetanus Vaccine  08/18/2020  . Flu Shot  Completed  . Pneumonia vaccines  Completed   Keep follow up with Dr Laurance Flatten

## 2018-11-28 DIAGNOSIS — Z1231 Encounter for screening mammogram for malignant neoplasm of breast: Secondary | ICD-10-CM | POA: Diagnosis not present

## 2018-11-28 LAB — HM MAMMOGRAPHY

## 2018-12-06 DIAGNOSIS — D485 Neoplasm of uncertain behavior of skin: Secondary | ICD-10-CM | POA: Diagnosis not present

## 2018-12-06 DIAGNOSIS — L820 Inflamed seborrheic keratosis: Secondary | ICD-10-CM | POA: Diagnosis not present

## 2018-12-23 ENCOUNTER — Other Ambulatory Visit: Payer: Self-pay

## 2018-12-23 DIAGNOSIS — R7989 Other specified abnormal findings of blood chemistry: Secondary | ICD-10-CM | POA: Diagnosis not present

## 2018-12-24 LAB — BMP8+EGFR
BUN/Creatinine Ratio: 15 (ref 12–28)
BUN: 19 mg/dL (ref 8–27)
CHLORIDE: 99 mmol/L (ref 96–106)
CO2: 26 mmol/L (ref 20–29)
Calcium: 9.7 mg/dL (ref 8.7–10.3)
Creatinine, Ser: 1.3 mg/dL — ABNORMAL HIGH (ref 0.57–1.00)
GFR calc Af Amer: 45 mL/min/{1.73_m2} — ABNORMAL LOW (ref >59)
GFR calc non Af Amer: 39 mL/min/{1.73_m2} — ABNORMAL LOW (ref >59)
Glucose: 99 mg/dL (ref 65–99)
Potassium: 3.4 mmol/L — ABNORMAL LOW (ref 3.5–5.2)
Sodium: 144 mmol/L (ref 134–144)

## 2018-12-25 ENCOUNTER — Other Ambulatory Visit: Payer: Self-pay | Admitting: *Deleted

## 2018-12-25 DIAGNOSIS — E876 Hypokalemia: Secondary | ICD-10-CM

## 2019-01-03 ENCOUNTER — Telehealth: Payer: Self-pay | Admitting: Family Medicine

## 2019-01-03 MED ORDER — OMEPRAZOLE 40 MG PO CPDR: 40.0000 mg | DELAYED_RELEASE_CAPSULE | Freq: Every day | ORAL | 1 refills | Status: DC

## 2019-01-03 MED ORDER — SUCRALFATE 1 G PO TABS: 1.0000 g | ORAL_TABLET | Freq: Two times a day (BID) | ORAL | 1 refills | Status: DC

## 2019-01-03 MED ORDER — FUROSEMIDE 20 MG PO TABS: ORAL_TABLET | ORAL | 3 refills | Status: DC

## 2019-01-03 MED ORDER — HYDROCHLOROTHIAZIDE 25 MG PO TABS: 25.0000 mg | ORAL_TABLET | Freq: Every day | ORAL | 1 refills | Status: DC

## 2019-01-03 NOTE — Telephone Encounter (Signed)
What is the name of the medication? Sucralfate 1gm tab, omeprazole DR 40mg  Cap, hydrochlorothiazide 25mg  tab, furosemide 20mg  tab  Have you contacted your pharmacy to request a refill? yes  Which pharmacy would you like this sent to? [facility]   Patient notified that their request is being sent to the clinical staff for review and that they should receive a call once it is complete. If they do not receive a call within 24 hours they can check with their pharmacy or our office.

## 2019-01-04 ENCOUNTER — Other Ambulatory Visit: Payer: Self-pay | Admitting: Family Medicine

## 2019-01-05 ENCOUNTER — Other Ambulatory Visit: Payer: Self-pay | Admitting: *Deleted

## 2019-01-05 MED ORDER — OXAZEPAM 15 MG PO CAPS: ORAL_CAPSULE | ORAL | 4 refills | Status: DC

## 2019-01-05 NOTE — Addendum Note (Signed)
Addended by: Antonietta Barcelona D on: 01/05/2019 08:46 AM   Modules accepted: Orders

## 2019-03-21 ENCOUNTER — Encounter: Payer: Self-pay | Admitting: Family Medicine

## 2019-03-21 ENCOUNTER — Other Ambulatory Visit: Payer: Self-pay

## 2019-03-21 ENCOUNTER — Ambulatory Visit (INDEPENDENT_AMBULATORY_CARE_PROVIDER_SITE_OTHER): Payer: Medicare HMO | Admitting: Family Medicine

## 2019-03-21 DIAGNOSIS — E782 Mixed hyperlipidemia: Secondary | ICD-10-CM

## 2019-03-21 DIAGNOSIS — E039 Hypothyroidism, unspecified: Secondary | ICD-10-CM

## 2019-03-21 DIAGNOSIS — K222 Esophageal obstruction: Secondary | ICD-10-CM | POA: Diagnosis not present

## 2019-03-21 DIAGNOSIS — N183 Chronic kidney disease, stage 3 (moderate): Secondary | ICD-10-CM | POA: Diagnosis not present

## 2019-03-21 DIAGNOSIS — J441 Chronic obstructive pulmonary disease with (acute) exacerbation: Secondary | ICD-10-CM

## 2019-03-21 DIAGNOSIS — R7989 Other specified abnormal findings of blood chemistry: Secondary | ICD-10-CM | POA: Diagnosis not present

## 2019-03-21 DIAGNOSIS — E559 Vitamin D deficiency, unspecified: Secondary | ICD-10-CM | POA: Diagnosis not present

## 2019-03-21 DIAGNOSIS — K2100 Gastro-esophageal reflux disease with esophagitis, without bleeding: Secondary | ICD-10-CM

## 2019-03-21 DIAGNOSIS — E034 Atrophy of thyroid (acquired): Secondary | ICD-10-CM

## 2019-03-21 DIAGNOSIS — I700 Atherosclerosis of aorta: Secondary | ICD-10-CM | POA: Diagnosis not present

## 2019-03-21 DIAGNOSIS — I10 Essential (primary) hypertension: Secondary | ICD-10-CM

## 2019-03-21 DIAGNOSIS — F411 Generalized anxiety disorder: Secondary | ICD-10-CM

## 2019-03-21 DIAGNOSIS — N1832 Chronic kidney disease, stage 3b: Secondary | ICD-10-CM

## 2019-03-21 MED ORDER — POTASSIUM CHLORIDE 20 MEQ/15ML (10%) PO SOLN: 10.0000 meq | Freq: Every day | ORAL | 6 refills | Status: DC | PRN

## 2019-03-21 NOTE — Patient Instructions (Signed)
Continue to drink plenty of water and fluids and stay well-hydrated Continue to wear protective equipment when in the public including mask and regular handwashing and cleansing Use inhalers more regularly Take liquid Mucinex twice daily to keep congestion loose To the office for lab work and contact nurse about the safest time to come to get this done.  3 to 4 weeks would be a good time. If swallowing problems get worse get back in touch with Dr. Watt Climes

## 2019-03-21 NOTE — Progress Notes (Signed)
Virtual Visit Via telephone Note I connected with@ on 03/21/19 by telephone and verified that I am speaking with the correct person or authorized healthcare agent using two identifiers. [name] is currently located at home and there are no unauthorized people in close proximity. I completed this visit while in a private location in my home .  This visit type was conducted due to national recommendations for restrictions regarding the COVID-19 Pandemic (e.g. social distancing).  This format is felt to be most appropriate for this patient at this time.  All issues noted in this document were discussed and addressed.  No physical exam was performed.    I discussed the limitations, risks, security and privacy concerns of performing an evaluation and management service by telephone and the availability of in person appointments. I also discussed with the patient that there may be a patient responsible charge related to this service. The patient expressed understanding and agreed to proceed.   Date:  03/21/2019    ID:  [name]      [birth year]        [id number]   Patient Care Team Patient Care Team: Chipper Herb, MD as PCP - General (Family Medicine) Minus Breeding, MD (Cardiology) Clarene Essex, MD (Gastroenterology) Skeet Latch, MD as Attending Physician (Cardiology) Sandford Craze, MD as Referring Physician (Dermatology)  Reason for Visit: Primary Care Follow-up     History of Present Illness & Review of Systems:     [name] is a 78 y.o. year old female primary care patient that presents today for a telehealth visit.  The patient is alert and doing well.  She denies any chest pain.  She has had some worsening shortness of breath problems at times.  She was asked to use her inhalers more regularly drink more fluids and take Mucinex for cough and congestion.  She also complains of more trouble with her swallowing and feels that she may need to be  re-dilated by the gastroenterologist for the stricture she has had in the past.  She also has some discomfort in her chest and feels that this is due to the reflux.  She is taking her omeprazole regularly and takes the Carafate occasionally before each meal and I instructed her she should take the Carafate more regularly before meals.  She also has constipation and with her current treatment of MiraLAX this is working okay.  She is passing her water well.  Her vision checks are up-to-date.  Her mammogram is up-to-date.  She has not seen Dr. Archie Patten in a good while and we will check and find out when that next visit is to occur.  She is also taking liquid potassium and says the quantity is too large and will get a smaller quantity when she gets it refilled.  Review of systems as stated, otherwise negative.  The patient does not have symptoms concerning for COVID-19 infection (fever, chills, cough, or new shortness of breath).      Current Medications (Verified) Allergies as of 03/21/2019      Reactions   Bisphosphonates Other (See Comments)   Difficulty swallowing   Ciprofloxacin Nausea And Vomiting   Codeine Nausea And Vomiting   Sulfa Antibiotics Nausea And Vomiting      Medication List       Accurate as of March 21, 2019 11:09 AM. If you have any questions, ask your nurse or doctor.        acetaminophen 500  MG tablet Commonly known as:  TYLENOL Take 500 mg by mouth every 8 (eight) hours as needed for mild pain or moderate pain.   albuterol 108 (90 Base) MCG/ACT inhaler Commonly known as:  VENTOLIN HFA Inhale 2 puffs into the lungs every 6 (six) hours as needed for wheezing or shortness of breath.   atorvastatin 80 MG tablet Commonly known as:  LIPITOR TAKE 1 TABLET EVERY DAY What changed:    how much to take  how to take this  when to take this   Calcium-Phosphorus-Vitamin D 250-115-250 MG-MG-UNIT Chew Commonly known as:  Citracal Calcium Gummies Chew 1 each by mouth 2  (two) times daily.   furosemide 20 MG tablet Commonly known as:  LASIX Take 20 mg by mouth each day   hydrochlorothiazide 25 MG tablet Commonly known as:  HYDRODIURIL Take 1 tablet (25 mg total) by mouth daily.   levothyroxine 100 MCG tablet Commonly known as:  SYNTHROID Take 50 mcg by mouth See admin instructions. Takes Mon through Fri. DOES NOT TAKE ON SAT and SUN What changed:    how much to take  how to take this  when to take this  additional instructions   LUBRICANT EYE DROPS OP Place 1 drop into both eyes daily as needed (for dry eyes).   metoprolol tartrate 25 MG tablet Commonly known as:  LOPRESSOR Take 1 tablet (25 mg total) by mouth 2 (two) times daily as needed.   multivitamin with minerals Tabs tablet Take 2 tablets by mouth daily.   omeprazole 40 MG capsule Commonly known as:  PRILOSEC Take 1 capsule (40 mg total) by mouth daily.   oxazepam 15 MG capsule Commonly known as:  SERAX Take 1 tab po TID PRN   polyethylene glycol 17 g packet Commonly known as:  MIRALAX / GLYCOLAX Take 17 g by mouth daily as needed for moderate constipation.   potassium chloride 20 MEQ/15ML (10%) Soln Take 10 mEq by mouth daily as needed (for cramping).   sucralfate 1 g tablet Commonly known as:  CARAFATE Take 1 tablet (1 g total) by mouth 2 (two) times daily.   Vitamin D3 50 MCG (2000 UT) Chew Chew 4,000 Units by mouth daily.           Allergies (Verified)    Bisphosphonates; Ciprofloxacin; Codeine; and Sulfa antibiotics  Past Medical History Past Medical History:  Diagnosis Date  . Anxiety states   . Breast cyst   . Cancer ([place])    skin  . Cataract   . Constipation   . Esophageal stricture   . Essential hypertension 12/18/2015  . Fracture, rib   . GERD (gastroesophageal reflux disease)   . Hiatal hernia   . Lower extremity edema 12/18/2015  . Osteoporosis   . Other and unspecified hyperlipidemia   . PAC (premature atrial contraction) 12/30/2015  .  Palpitations 12/18/2015  . Unspecified hypothyroidism   . Vertebral fracture, osteoporotic [facility])      Past Surgical History:  Procedure Laterality Date  . ABDOMINAL HYSTERECTOMY    . APPENDECTOMY    . BREAST BIOPSY     bil cysts  . CATARACT EXTRACTION    . ESOPHAGOGASTRODUODENOSCOPY N/A 01/19/2018   Procedure: ESOPHAGOGASTRODUODENOSCOPY (EGD);  Surgeon: Clarene Essex, MD;  Location: [facility];  Service: Endoscopy;  Laterality: N/A;  with flouro  . EYE SURGERY    . HAND SURGERY Left   . IR GENERIC HISTORICAL  12/10/2016   IR RADIOLOGIST EVAL & MGMT 12/10/2016 [facility]  .  IR GENERIC HISTORICAL  12/23/2016   IR KYPHO THORACIC WITH BONE BIOPSY 12/23/2016 Luanne Bras, MD [facility]  . PARTIAL HYSTERECTOMY    . THYROID LOBECTOMY     right  . VERTEBROPLASTY      Social History   Socioeconomic History  . Marital status: Married    Spouse name: elmer  . Number of children: 0  . Years of education: Not on file  . Highest education level: Not on file  Occupational History  . Occupation: retired    Comment: Musician  Social Needs  . Financial resource strain: Not on file  . Food insecurity:    Worry: Not on file    Inability: Not on file  . Transportation needs:    Medical: Not on file    Non-medical: Not on file  Tobacco Use  . Smoking status: Former Research scientist (life sciences)  . Smokeless tobacco: Never Used  . Tobacco comment: Non-smoker  quit 12-13 years  Substance and Sexual Activity  . Alcohol use: No  . Drug use: No  . Sexual activity: Never  Lifestyle  . Physical activity:    Days per week: Not on file    Minutes per session: Not on file  . Stress: Not on file  Relationships  . Social connections:    Talks on phone: Not on file    Gets together: Not on file    Attends religious service: Not on file    Active member of club or organization: Not on file    Attends meetings of clubs or organizations: Not on file    Relationship status: Not on file  Other Topics  Concern  . Not on file  Social History Narrative  . Not on file     Family History  Problem Relation Age of Onset  . Goiter Father   . Alcohol abuse Father   . Heart disease Sister   . Cancer Sister        breast and skin  . Goiter Brother   . Dementia Brother   . Hyperlipidemia Brother   . Hernia Brother   . Cancer Brother        throat  . Cancer Brother        neck  . Heart disease Brother   . Early death Brother        auto accident      Labs/Other Tests and Data Reviewed:    Wt Readings from Last 3 Encounters:  11/16/18 123 lb (55.8 kg)  11/08/18 123 lb (55.8 kg)  08/07/18 123 lb (55.8 kg)   Temp Readings from Last 3 Encounters:  11/16/18 98.2 F (36.8 C) (Oral)  11/08/18 98.5 F (36.9 C) (Oral)  08/07/18 99.2 F (37.3 C) (Oral)   BP Readings from Last 3 Encounters:  11/16/18 124/74  11/08/18 (!) 147/90  08/07/18 135/83   Pulse Readings from Last 3 Encounters:  11/16/18 94  11/08/18 96  08/07/18 (!) 117     No results found for: HGBA1C Lab Results  Component Value Date   LDLCALC 58 11/11/2018   CREATININE 1.30 (H) 12/23/2018       Chemistry      Component Value Date/Time   NA 144 12/23/2018 0958   K 3.4 (L) 12/23/2018 0958   CL 99 12/23/2018 0958   CO2 26 12/23/2018 0958   BUN 19 12/23/2018 0958   CREATININE 1.30 (H) 12/23/2018 0958   CREATININE 0.89 07/23/2013 0858      Component Value Date/Time  CALCIUM 9.7 12/23/2018 0958   ALKPHOS 108 11/11/2018 1017   AST 18 11/11/2018 1017   ALT 13 11/11/2018 1017   BILITOT 0.5 11/11/2018 1017         OBSERVATIONS/ OBJECTIVE:     The patient is alert and says her temperature today was 97.1.  Her blood pressure usually runs in the range of 124 over to 70-80.  She is eating well although having trouble with swallowing which seems to be getting worse again.  Her weight is running about 124 and this is stable for her.  Physical exam deferred due to nature of telephonic visit.   ASSESSMENT & PLAN    Time:   Today, I have spent 28 minutes with the patient via telephone discussing the above including Covid precautions.     Visit Diagnoses: 1. Gastroesophageal reflux disease with esophagitis -Patient is having a little more trouble with swallowing and Dr. Driscilla Grammes said that she may need another dilatation.  She needs to take her omeprazole regularly and take the Carafate regularly before each meal and if she does not get better she needs to call him sooner.  2. Essential hypertension -Sugars at home are good and she will continue with current treatment and watching sodium intake  3. Vitamin D deficiency -Continue with vitamin D replacement pending results of lab work  4. Mixed hyperlipidemia -Continue with aggressive therapeutic lifestyle changes and atorvastatin pending results of lab work  5. Hypothyroidism, unspecified type -Continue with current thyroid therapy which is 50 mcg daily Monday through Friday and none on Saturday and Sunday pending results of lab work  6. Atherosclerosis of aorta ([place]) -Continue with statin therapy and as aggressive therapeutic lifestyle changes as possible  7. Elevated serum creatinine -Avoid all NSAIDs, watch sodium intake and keep blood pressure checked regularly if possible  8. Esophageal stricture -Take Carafate and omeprazole and follow-up with Dr. Watt Climes  9. Hypothyroidism due to acquired atrophy of thyroid -Continue current treatment pending results of lab work  15. Chronic kidney disease (CKD) stage G3b/A1, moderately decreased glomerular filtration rate (GFR) between 30-44 mL/min/1.73 square meter and albuminuria creatinine ratio less than 30 mg/g ([facility]) -Keep blood pressure under the best control possible watch sodium intake  11. Generalized anxiety disorder -Continue with oxazepam as needed  12.  COPD exacerbation -Use Mucinex regularly and use inhalers more regularly and drink plenty of fluids  Patient  Instructions  Continue to drink plenty of water and fluids and stay well-hydrated Continue to wear protective equipment when in the public including mask and regular handwashing and cleansing Use inhalers more regularly Take liquid Mucinex twice daily to keep congestion loose To the office for lab work and contact nurse about the safest time to come to get this done.  3 to 4 weeks would be a good time. If swallowing problems get worse get back in touch with Dr. Watt Climes    The above assessment and management plan was discussed with the patient. The patient verbalized understanding of and has agreed to the management plan. Patient is aware to call the clinic if symptoms persist or worsen. Patient is aware when to return to the clinic for a follow-up visit. Patient educated on when it is appropriate to go to the emergency department.    Chipper Herb, MD [address], [place], [place] [address] Ph [phone]   Arrie Senate MD

## 2019-03-21 NOTE — Addendum Note (Signed)
Addended by: Zannie Cove on: 03/21/2019 04:10 PM   Modules accepted: Orders

## 2019-04-09 ENCOUNTER — Other Ambulatory Visit: Payer: Self-pay | Admitting: Family Medicine

## 2019-05-02 ENCOUNTER — Other Ambulatory Visit: Payer: Medicare HMO

## 2019-05-02 DIAGNOSIS — E782 Mixed hyperlipidemia: Secondary | ICD-10-CM | POA: Diagnosis not present

## 2019-05-02 DIAGNOSIS — I10 Essential (primary) hypertension: Secondary | ICD-10-CM | POA: Diagnosis not present

## 2019-05-02 DIAGNOSIS — E559 Vitamin D deficiency, unspecified: Secondary | ICD-10-CM | POA: Diagnosis not present

## 2019-05-02 NOTE — Progress Notes (Signed)
Patient is going to start seeing [facility] - labs changed to [facility] instead of [facility].

## 2019-05-03 LAB — CBC WITH DIFFERENTIAL/PLATELET
Basophils Absolute: 0.1 10*3/uL (ref 0.0–0.2)
Basos: 1 %
EOS (ABSOLUTE): 0.3 10*3/uL (ref 0.0–0.4)
Eos: 4 %
Hematocrit: 37.6 % (ref 34.0–46.6)
Hemoglobin: 12.1 g/dL (ref 11.1–15.9)
Immature Grans (Abs): 0 10*3/uL (ref 0.0–0.1)
Immature Granulocytes: 0 %
Lymphocytes Absolute: 1.9 10*3/uL (ref 0.7–3.1)
Lymphs: 30 %
MCH: 29.2 pg (ref 26.6–33.0)
MCHC: 32.2 g/dL (ref 31.5–35.7)
MCV: 91 fL (ref 79–97)
Monocytes Absolute: 0.6 10*3/uL (ref 0.1–0.9)
Monocytes: 9 %
Neutrophils Absolute: 3.4 10*3/uL (ref 1.4–7.0)
Neutrophils: 56 %
Platelets: 264 10*3/uL (ref 150–450)
RBC: 4.14 x10E6/uL (ref 3.77–5.28)
RDW: 12.1 % (ref 11.7–15.4)
WBC: 6.2 10*3/uL (ref 3.4–10.8)

## 2019-05-03 LAB — LIPID PANEL
Chol/HDL Ratio: 2.9 ratio (ref 0.0–4.4)
Cholesterol, Total: 129 mg/dL (ref 100–199)
HDL: 45 mg/dL (ref >39)
LDL Calculated: 61 mg/dL (ref 0–99)
Triglycerides: 114 mg/dL (ref 0–149)
VLDL Cholesterol Cal: 23 mg/dL (ref 5–40)

## 2019-05-03 LAB — BMP8+EGFR
BUN/Creatinine Ratio: 16 (ref 12–28)
BUN: 19 mg/dL (ref 8–27)
CO2: 26 mmol/L (ref 20–29)
Calcium: 9.4 mg/dL (ref 8.7–10.3)
Chloride: 99 mmol/L (ref 96–106)
Creatinine, Ser: 1.16 mg/dL — ABNORMAL HIGH (ref 0.57–1.00)
GFR calc Af Amer: 51 mL/min/{1.73_m2} — ABNORMAL LOW (ref >59)
GFR calc non Af Amer: 45 mL/min/{1.73_m2} — ABNORMAL LOW (ref >59)
Glucose: 112 mg/dL — ABNORMAL HIGH (ref 65–99)
Potassium: 3.8 mmol/L (ref 3.5–5.2)
Sodium: 142 mmol/L (ref 134–144)

## 2019-05-03 LAB — HEPATIC FUNCTION PANEL
ALT: 9 IU/L (ref 0–32)
AST: 22 IU/L (ref 0–40)
Albumin: 4.2 g/dL (ref 3.7–4.7)
Alkaline Phosphatase: 119 IU/L — ABNORMAL HIGH (ref 39–117)
Bilirubin Total: 0.4 mg/dL (ref 0.0–1.2)
Bilirubin, Direct: 0.13 mg/dL (ref 0.00–0.40)
Total Protein: 7 g/dL (ref 6.0–8.5)

## 2019-05-03 LAB — VITAMIN D 25 HYDROXY (VIT D DEFICIENCY, FRACTURES): Vit D, 25-Hydroxy: 30.5 ng/mL (ref 30.0–100.0)

## 2019-05-10 ENCOUNTER — Telehealth: Payer: Self-pay | Admitting: Family Medicine

## 2019-05-10 NOTE — Telephone Encounter (Signed)
lmtcb

## 2019-05-16 NOTE — Telephone Encounter (Signed)
Pt wants lab results 

## 2019-06-06 ENCOUNTER — Other Ambulatory Visit: Payer: Self-pay | Admitting: Family Medicine

## 2019-06-07 NOTE — Telephone Encounter (Signed)
I will give her enough to get through to the visit because assessment was scheduled from before and the son is not her fault but from here on out she always needs to get this refilled during a visit.

## 2019-07-04 ENCOUNTER — Other Ambulatory Visit: Payer: Self-pay | Admitting: Family Medicine

## 2019-07-04 NOTE — Telephone Encounter (Signed)
OV 07/23/19

## 2019-07-04 NOTE — Telephone Encounter (Signed)
Patient has appt scheduled for 10/5  

## 2019-07-04 NOTE — Addendum Note (Signed)
Addended by: Antonietta Barcelona D on: 07/04/2019 11:20 AM   Modules accepted: Orders

## 2019-07-04 NOTE — Telephone Encounter (Signed)
I have already bent on her once and given a refill outside of a visit, I cannot do it again she needs to be seen in a visit, this was a month ago when we did it last time.

## 2019-07-23 ENCOUNTER — Ambulatory Visit: Payer: Medicare HMO | Admitting: Family Medicine

## 2019-07-30 ENCOUNTER — Other Ambulatory Visit: Payer: Self-pay

## 2019-07-31 ENCOUNTER — Ambulatory Visit (INDEPENDENT_AMBULATORY_CARE_PROVIDER_SITE_OTHER): Payer: Medicare HMO

## 2019-07-31 DIAGNOSIS — Z23 Encounter for immunization: Secondary | ICD-10-CM | POA: Diagnosis not present

## 2019-08-06 ENCOUNTER — Other Ambulatory Visit: Payer: Self-pay | Admitting: Family Medicine

## 2019-08-06 NOTE — Telephone Encounter (Signed)
Patient needs to come in for refills, it is controlled substance and they are cracking down more and more on this.

## 2019-08-23 ENCOUNTER — Other Ambulatory Visit: Payer: Self-pay

## 2019-08-24 ENCOUNTER — Encounter: Payer: Self-pay | Admitting: Family Medicine

## 2019-08-24 ENCOUNTER — Other Ambulatory Visit: Payer: Self-pay

## 2019-08-24 ENCOUNTER — Ambulatory Visit (INDEPENDENT_AMBULATORY_CARE_PROVIDER_SITE_OTHER): Payer: Medicare HMO | Admitting: Family Medicine

## 2019-08-24 VITALS — BP 140/81 | HR 100 | Ht 61.0 in | Wt 123.0 lb

## 2019-08-24 DIAGNOSIS — N1832 Chronic kidney disease, stage 3b: Secondary | ICD-10-CM

## 2019-08-24 DIAGNOSIS — R7309 Other abnormal glucose: Secondary | ICD-10-CM

## 2019-08-24 DIAGNOSIS — K219 Gastro-esophageal reflux disease without esophagitis: Secondary | ICD-10-CM

## 2019-08-24 DIAGNOSIS — E034 Atrophy of thyroid (acquired): Secondary | ICD-10-CM

## 2019-08-24 DIAGNOSIS — E782 Mixed hyperlipidemia: Secondary | ICD-10-CM | POA: Diagnosis not present

## 2019-08-24 DIAGNOSIS — F411 Generalized anxiety disorder: Secondary | ICD-10-CM | POA: Diagnosis not present

## 2019-08-24 DIAGNOSIS — R7989 Other specified abnormal findings of blood chemistry: Secondary | ICD-10-CM

## 2019-08-24 DIAGNOSIS — I10 Essential (primary) hypertension: Secondary | ICD-10-CM

## 2019-08-24 LAB — BAYER DCA HB A1C WAIVED: HB A1C (BAYER DCA - WAIVED): 6 % (ref <7.0)

## 2019-08-24 MED ORDER — OXAZEPAM 15 MG PO CAPS: 15.0000 mg | ORAL_CAPSULE | Freq: Three times a day (TID) | ORAL | 2 refills | Status: DC | PRN

## 2019-08-24 MED ORDER — LEVOTHYROXINE SODIUM 100 MCG PO TABS: 50.0000 ug | ORAL_TABLET | ORAL | 3 refills | Status: DC

## 2019-08-24 NOTE — Progress Notes (Signed)
BP 140/81   Pulse 100   Ht 5' 1"  (1.549 m)   Wt 123 lb (55.8 kg)   SpO2 96%   BMI 23.24 kg/m    Subjective:   Patient ID: [name], female    DOB: 01/09/1941, 78 y.o.   MRN: [id number]  HPI: [name] is a 78 y.o. female presenting on 08/24/2019 for Establish Care (check up of chronic medical conditions)   HPI Anxiety Patient currently takes oxazepam to help sleep and for anxiety.  Patient says it controls for the most part.  She denies any suicidal ideations or thoughts of hurting self. Current rx-oxazepam 15 mg 3 times daily as needed # meds rx-90 Effectiveness of current meds-working well Adverse reactions form meds-none  Pill count performed-No Last drug screen -N/A ( high risk q72m moderate risk q646mlow risk yearly ) Urine drug screen today- Yes Was the [place]-yes  If yes were their any concerning findings? -None  No flowsheet data found.   Controlled substance contract signed on: Today  Hypothyroidism recheck Patient is coming in for thyroid recheck today as well. They deny any issues with hair changes or heat or cold problems or diarrhea or constipation. They deny any chest pain or palpitations. They are currently on levothyroxine 10073mograms   Hypertension Patient is currently on hydrochlorothiazide and metoprolol, and their blood pressure today is 140/81. Patient denies any lightheadedness or dizziness. Patient denies headaches, blurred vision, chest pains, shortness of breath, or weakness. Denies any side effects from medication and is content with current medication.   Hyperlipidemia Patient is coming in for recheck of his hyperlipidemia. The patient is currently taking atorvastatin. They deny any issues with myalgias or history of liver damage from it. They deny any focal numbness or weakness or chest pain.   GERD Patient is currently on omeprazole.  She denies any major symptoms or abdominal pain or belching or burping. She denies  any blood in her stool or lightheadedness or dizziness.   Relevant past medical, surgical, family and social history reviewed and updated as indicated. Interim medical history since our last visit reviewed. Allergies and medications reviewed and updated.  Review of Systems  Constitutional: Negative for chills and fever.  Eyes: Negative for visual disturbance.  Respiratory: Negative for chest tightness and shortness of breath.   Cardiovascular: Negative for chest pain and leg swelling.  Musculoskeletal: Negative for back pain and gait problem.  Skin: Negative for rash.  Neurological: Negative for light-headedness and headaches.  Psychiatric/Behavioral: Negative for agitation, behavioral problems, dysphoric mood, self-injury, sleep disturbance and suicidal ideas. The patient is nervous/anxious.   All other systems reviewed and are negative.   Per HPI unless specifically indicated above   Allergies as of 08/24/2019      Reactions   Bisphosphonates Other (See Comments)   Difficulty swallowing   Ciprofloxacin Nausea And Vomiting   Codeine Nausea And Vomiting   Sulfa Antibiotics Nausea And Vomiting      Medication List       Accurate as of August 24, 2019  1:13 PM. If you have any questions, ask your nurse or doctor.        acetaminophen 500 MG tablet Commonly known as: TYLENOL Take 500 mg by mouth every 8 (eight) hours as needed for mild pain or moderate pain.   albuterol 108 (90 Base) MCG/ACT inhaler Commonly known as: VENTOLIN HFA Inhale 2 puffs into the lungs every 6 (six) hours as needed for wheezing or shortness  of breath.   atorvastatin 80 MG tablet Commonly known as: LIPITOR TAKE 1 TABLET EVERY DAY What changed:   how much to take  how to take this  when to take this   Calcium-Phosphorus-Vitamin D 250-115-250 MG-MG-UNIT Chew Commonly known as: Citracal Calcium Gummies Chew 1 each by mouth 2 (two) times daily.   furosemide 20 MG tablet Commonly known as:  LASIX Take 20 mg by mouth each day   hydrochlorothiazide 25 MG tablet Commonly known as: HYDRODIURIL Take 1 tablet (25 mg total) by mouth daily.   levothyroxine 100 MCG tablet Commonly known as: SYNTHROID Take 50 mcg by mouth See admin instructions. Takes Mon through Fri. DOES NOT TAKE ON SAT and SUN What changed:   how much to take  how to take this  when to take this  additional instructions   LUBRICANT EYE DROPS OP Place 1 drop into both eyes daily as needed (for dry eyes).   metoprolol tartrate 25 MG tablet Commonly known as: LOPRESSOR Take 1 tablet (25 mg total) by mouth 2 (two) times daily as needed.   multivitamin with minerals Tabs tablet Take 2 tablets by mouth daily.   omeprazole 40 MG capsule Commonly known as: PRILOSEC TAKE ONE (1) CAPSULE EACH DAY   oxazepam 15 MG capsule Commonly known as: SERAX TAKE ONE (1) CAPSULE THREE (3) TIMES EACH DAY AS NEEDED   polyethylene glycol 17 g packet Commonly known as: MIRALAX / GLYCOLAX Take 17 g by mouth daily as needed for moderate constipation.   potassium chloride 20 MEQ/15ML (10%) Soln Take 7.5 mLs (10 mEq total) by mouth daily as needed (for cramping).   sucralfate 1 g tablet Commonly known as: CARAFATE Take 1 tablet (1 g total) by mouth 2 (two) times daily.   Vitamin D3 50 MCG (2000 UT) Chew Chew 4,000 Units by mouth daily.        Objective:   BP 140/81   Pulse 100   Ht 5' 1"  (1.549 m)   Wt 123 lb (55.8 kg)   SpO2 96%   BMI 23.24 kg/m   Wt Readings from Last 3 Encounters:  08/24/19 123 lb (55.8 kg)  11/16/18 123 lb (55.8 kg)  11/08/18 123 lb (55.8 kg)    Physical Exam Vitals signs and nursing note reviewed.  Constitutional:      General: She is not in acute distress.    Appearance: She is well-developed. She is not diaphoretic.  Eyes:     Conjunctiva/sclera: Conjunctivae normal.  Cardiovascular:     Rate and Rhythm: Normal rate and regular rhythm.     Heart sounds: Normal heart  sounds. No murmur.  Pulmonary:     Effort: Pulmonary effort is normal. No respiratory distress.     Breath sounds: Normal breath sounds. No wheezing.  Musculoskeletal: Normal range of motion.        General: No tenderness.  Skin:    General: Skin is warm and dry.     Findings: No rash.  Neurological:     Mental Status: She is alert and oriented to person, place, and time.     Coordination: Coordination normal.  Psychiatric:        Behavior: Behavior normal.       Assessment & Plan:   Problem List Items Addressed This Visit      Cardiovascular and Mediastinum   Essential hypertension   Relevant Orders   BMP8+EGFR (Completed)     Digestive   GERD (gastroesophageal reflux disease)  Endocrine   Hypothyroid - Primary   Relevant Medications   levothyroxine (SYNTHROID) 100 MCG tablet   Other Relevant Orders   TSH (Completed)     Genitourinary   Chronic kidney disease (CKD) stage G3b/A1, moderately decreased glomerular filtration rate (GFR) between 30-44 mL/min/1.73 square meter and albuminuria creatinine ratio less than 30 mg/g   Relevant Orders   BMP8+EGFR (Completed)     Other   Hyperlipemia   Generalized anxiety disorder   Relevant Medications   oxazepam (SERAX) 15 MG capsule   Other Relevant Orders   DRUG SCREEN-TOXASSURE (Completed)    Other Visit Diagnoses    Elevated serum creatinine       Elevated glucose       Relevant Orders   hgba1c (Completed)   BMP8+EGFR (Completed)      Discussed possible reduction of medicine in the future but for now the patient will keep the same for anxiety. We will have a more extensive discussion about this.  Continue current thyroid medication will check blood work.  We will recheck blood work for CKD as well. Follow up plan: Return in about 3 months (around 11/24/2019), or if symptoms worsen or fail to improve, for anxiety.  Counseling provided for all of the vaccine components No orders of the defined types were  placed in this encounter.   Caryl Pina, MD [facility] 08/24/2019, 1:13 PM

## 2019-08-25 LAB — BMP8+EGFR
BUN/Creatinine Ratio: 20 (ref 12–28)
BUN: 23 mg/dL (ref 8–27)
CO2: 26 mmol/L (ref 20–29)
Calcium: 9.5 mg/dL (ref 8.7–10.3)
Chloride: 99 mmol/L (ref 96–106)
Creatinine, Ser: 1.13 mg/dL — ABNORMAL HIGH (ref 0.57–1.00)
GFR calc Af Amer: 53 mL/min/{1.73_m2} — ABNORMAL LOW (ref >59)
GFR calc non Af Amer: 46 mL/min/{1.73_m2} — ABNORMAL LOW (ref >59)
Glucose: 102 mg/dL — ABNORMAL HIGH (ref 65–99)
Potassium: 3.6 mmol/L (ref 3.5–5.2)
Sodium: 140 mmol/L (ref 134–144)

## 2019-08-25 LAB — TSH: TSH: 0.551 u[IU]/mL (ref 0.450–4.500)

## 2019-08-26 LAB — TOXASSURE SELECT 13 (MW), URINE

## 2019-10-25 ENCOUNTER — Other Ambulatory Visit: Payer: Self-pay | Admitting: Family Medicine

## 2019-11-06 ENCOUNTER — Other Ambulatory Visit: Payer: Self-pay | Admitting: *Deleted

## 2019-11-06 MED ORDER — LEVOTHYROXINE SODIUM 100 MCG PO TABS: 50.0000 ug | ORAL_TABLET | ORAL | 3 refills | Status: DC

## 2019-11-06 MED ORDER — ATORVASTATIN CALCIUM 80 MG PO TABS: 80.0000 mg | ORAL_TABLET | Freq: Every day | ORAL | 1 refills | Status: DC

## 2019-11-06 MED ORDER — FUROSEMIDE 20 MG PO TABS: ORAL_TABLET | ORAL | 1 refills | Status: DC

## 2019-11-06 MED ORDER — SUCRALFATE 1 G PO TABS: 1.0000 g | ORAL_TABLET | Freq: Two times a day (BID) | ORAL | 1 refills | Status: DC

## 2019-11-06 NOTE — Addendum Note (Signed)
Addended by: Antonietta Barcelona D on: 11/06/2019 03:10 PM   Modules accepted: Orders

## 2019-11-09 ENCOUNTER — Other Ambulatory Visit: Payer: Self-pay | Admitting: *Deleted

## 2019-11-09 MED ORDER — HYDROCHLOROTHIAZIDE 25 MG PO TABS: 25.0000 mg | ORAL_TABLET | Freq: Every day | ORAL | 0 refills | Status: DC

## 2019-11-19 ENCOUNTER — Ambulatory Visit (INDEPENDENT_AMBULATORY_CARE_PROVIDER_SITE_OTHER): Payer: Medicare HMO | Admitting: *Deleted

## 2019-11-19 DIAGNOSIS — Z0000 Encounter for general adult medical examination without abnormal findings: Secondary | ICD-10-CM | POA: Diagnosis not present

## 2019-11-19 NOTE — Progress Notes (Signed)
MEDICARE ANNUAL WELLNESS VISIT  11/19/2019  Telephone Visit Disclaimer This Medicare AWV was conducted by telephone due to national recommendations for restrictions regarding the COVID-19 Pandemic (e.g. social distancing).  I verified, using two identifiers, that I am speaking with [name] or their authorized healthcare agent. I discussed the limitations, risks, security, and privacy concerns of performing an evaluation and management service by telephone and the potential availability of an in-person appointment in the future. The patient expressed understanding and agreed to proceed.   Subjective:  [name] is a 79 y.o. female patient of [name] who had a Medicare Annual Wellness Visit today via telephone. [name] is Retired and lives with their spouse. she has 0 children. she reports that she is socially active and does interact with friends/family regularly. she is not physically active and enjoys shopping, going out to eat and traveling.  Patient Care Team: [name] as PCP - General (Family Medicine) [name] (Cardiology) [name] (Gastroenterology) [name] as Attending Physician (Cardiology) [name] as Referring Physician (Dermatology)  Advanced Directives 11/19/2019 11/16/2018 01/19/2018 01/11/2018 08/16/2017 12/23/2016 05/28/2015  Does Patient Have a Medical Advance Directive? No No No No No No No  Would patient like information on creating a medical advance directive? No - Patient declined No - Patient declined No - Patient declined No - Patient declined No - Patient declined - Yes - Educational materials given    Hospital Utilization Over the Past 12 Months: # of hospitalizations or ER visits: 0 # of surgeries: 0  Review of Systems    Patient reports that her overall health is unchanged compared to last year.  History obtained from chart review  Patient Reported Readings (BP, Pulse, CBG,  Weight, etc) none  Pain Assessment Pain : No/denies pain     Current Medications & Allergies (verified) Allergies as of 11/19/2019      Reactions   Bisphosphonates Other (See Comments)   Difficulty swallowing   Ciprofloxacin Nausea And Vomiting   Codeine Nausea And Vomiting   Sulfa Antibiotics Nausea And Vomiting      Medication List       Accurate as of November 19, 2019 12:06 PM. If you have any questions, ask your nurse or doctor.        STOP taking these medications   albuterol 108 (90 Base) MCG/ACT inhaler Commonly known as: VENTOLIN HFA   LUBRICANT EYE DROPS OP     TAKE these medications   acetaminophen 500 MG tablet Commonly known as: TYLENOL Take 500 mg by mouth every 8 (eight) hours as needed for mild pain or moderate pain.   atorvastatin 80 MG tablet Commonly known as: LIPITOR Take 1 tablet (80 mg total) by mouth daily.   Calcium-Phosphorus-Vitamin D 250-115-250 MG-MG-UNIT Chew Commonly known as: Citracal Calcium Gummies Chew 1 each by mouth 2 (two) times daily.   furosemide 20 MG tablet Commonly known as: LASIX Take 20 mg by mouth each day   hydrochlorothiazide 25 MG tablet Commonly known as: HYDRODIURIL Take 1 tablet (25 mg total) by mouth daily.   levothyroxine 100 MCG tablet Commonly known as: SYNTHROID Take 0.5 tablets (50 mcg total) by mouth every Monday, Tuesday, Wednesday, Thursday, and Friday.   metoprolol tartrate 25 MG tablet Commonly known as: LOPRESSOR Take 1 tablet (25 mg total) by mouth 2 (two) times daily as needed.   multivitamin with minerals Tabs tablet Take 2 tablets by mouth  daily.   omeprazole 40 MG capsule Commonly known as: PRILOSEC TAKE ONE (1) CAPSULE EACH DAY   oxazepam 15 MG capsule Commonly known as: SERAX Take 1 capsule (15 mg total) by mouth 3 (three) times daily as needed for sleep.   polyethylene glycol 17 g packet Commonly known as: MIRALAX / GLYCOLAX Take 17 g by mouth daily as needed for moderate  constipation.   potassium chloride 20 MEQ/15ML (10%) Soln Take 7.5 mLs (10 mEq total) by mouth daily as needed (for cramping).   sucralfate 1 g tablet Commonly known as: CARAFATE Take 1 tablet (1 g total) by mouth 2 (two) times daily.   Vitamin D3 50 MCG (2000 UT) Chew Chew 4,000 Units by mouth daily.       History (reviewed): Past Medical History:  Diagnosis Date  . Anxiety states   . Breast cyst   . Cancer ([place])    skin  . Cataract   . Constipation   . Esophageal stricture   . Essential hypertension 12/18/2015  . Fracture, rib   . GERD (gastroesophageal reflux disease)   . Hiatal hernia   . Lower extremity edema 12/18/2015  . Osteoporosis   . Other and unspecified hyperlipidemia   . PAC (premature atrial contraction) 12/30/2015  . Palpitations 12/18/2015  . Unspecified hypothyroidism   . Vertebral fracture, osteoporotic [facility])    Past Surgical History:  Procedure Laterality Date  . ABDOMINAL HYSTERECTOMY    . APPENDECTOMY    . BREAST BIOPSY     bil cysts  . CATARACT EXTRACTION    . ESOPHAGOGASTRODUODENOSCOPY N/A 01/19/2018   Procedure: ESOPHAGOGASTRODUODENOSCOPY (EGD);  Surgeon: [name];  Location: [facility];  Service: Endoscopy;  Laterality: N/A;  with flouro  . EYE SURGERY    . HAND SURGERY Left   . IR GENERIC HISTORICAL  12/10/2016   IR RADIOLOGIST EVAL & MGMT 12/10/2016 [facility]  . IR GENERIC HISTORICAL  12/23/2016   IR KYPHO THORACIC WITH BONE BIOPSY 12/23/2016 Luanne Bras, [name] [facility]  . PARTIAL HYSTERECTOMY    . THYROID LOBECTOMY     right  . VERTEBROPLASTY     Family History  Problem Relation Age of Onset  . Goiter Father   . Alcohol abuse Father   . Heart disease Sister   . Cancer Sister        breast and skin  . Goiter Brother   . Dementia Brother   . Hyperlipidemia Brother   . Hernia Brother   . Cancer Brother        throat  . Cancer Brother        neck  . Heart disease Brother   . Early death Brother        auto  accident   Social History   Socioeconomic History  . Marital status: Married    Spouse name: [name]  . Number of children: 0  . Years of education: 8  . Highest education level: 8th grade  Occupational History  . Occupation: retired    Comment: Musician  Tobacco Use  . Smoking status: Former Smoker    Types: Cigarettes    Quit date: 11/18/2005    Years since quitting: 14.0  . Smokeless tobacco: Never Used  . Tobacco comment: Non-smoker  quit 12-13 years  Substance and Sexual Activity  . Alcohol use: No  . Drug use: No  . Sexual activity: Not Currently    Birth control/protection: Surgical  Other Topics Concern  . Not  on file  Social History Narrative  . Not on file   Social Determinants of Health   Financial Resource Strain: Low Risk   . Difficulty of Paying Living Expenses: Not hard at all  Food Insecurity: No Food Insecurity  . Worried About Charity fundraiser in the Last Year: Never true  . Ran Out of Food in the Last Year: Never true  Transportation Needs: No Transportation Needs  . Lack of Transportation (Medical): No  . Lack of Transportation (Non-Medical): No  Physical Activity: Inactive  . Days of Exercise per Week: 0 days  . Minutes of Exercise per Session: 0 min  Stress: No Stress Concern Present  . Feeling of Stress : Not at all  Social Connections: Somewhat Isolated  . Frequency of Communication with Friends and Family: More than three times a week  . Frequency of Social Gatherings with Friends and Family: More than three times a week  . Attends Religious Services: Never  . Active Member of Clubs or Organizations: No  . Attends Archivist Meetings: Never  . Marital Status: Married    Activities of Daily Living In your present state of health, do you have any difficulty performing the following activities: 11/19/2019  Hearing? Y  Comment pt states she can't afford hearing aids  Vision? N  Comment wears glasses-gets yearly eye exam    Difficulty concentrating or making decisions? N  Walking or climbing stairs? N  Comment she doesn't use the stairs-afraid she might fall  Dressing or bathing? N  Doing errands, shopping? N  Preparing Food and eating ? N  Using the Toilet? N  In the past six months, have you accidently leaked urine? Y  Comment if she doesn't get to the bathroom in time she dribbles a little-doesn't wear pads  Do you have problems with loss of bowel control? N  Managing your Medications? N  Managing your Finances? N  Housekeeping or managing your Housekeeping? N  Some recent data might be hidden    Patient Education/ Literacy How often do you need to have someone help you when you read instructions, pamphlets, or other written materials from your doctor or pharmacy?: 3 - Sometimes What is the last grade level you completed in school?: 8th grade  Exercise Current Exercise Habits: The patient does not participate in regular exercise at present, Exercise limited by: cardiac condition(s);orthopedic condition(s)  Diet Patient reports consuming 2 meals a day and 1 snack(s) a day Patient reports that her primary diet is: Regular Patient reports that she does have regular access to food.   Depression Screen PHQ 2/9 Scores 11/19/2019 08/24/2019 11/16/2018 08/07/2018 06/27/2018 05/10/2018 04/05/2018  PHQ - 2 Score 0 0 0 0 0 0 0     Fall Risk Fall Risk  11/19/2019 08/24/2019 11/16/2018 08/07/2018 06/27/2018  Falls in the past year? 0 0 0 No No  Number falls in past yr: - - - - -  Injury with Fall? - - - - -  Risk Factor Category  - - - - -  Follow up - - - - -     Objective:  [name] seemed alert and oriented and she participated appropriately during our telephone visit.  Blood Pressure Weight BMI  BP Readings from Last 3 Encounters:  08/24/19 140/81  11/16/18 124/74  11/08/18 (!) 147/90   Wt Readings from Last 3 Encounters:  08/24/19 123 lb (55.8 kg)  11/16/18 123 lb (55.8 kg)  11/08/18 123 lb  (  55.8 kg)   BMI Readings from Last 1 Encounters:  08/24/19 23.24 kg/m    *Unable to obtain current vital signs, weight, and BMI due to telephone visit type  Hearing/Vision  . Cherish did not seem to have difficulty with hearing/understanding during the telephone conversation . Reports that she has had a formal eye exam by an eye care professional within the past year . Reports that she has not had a formal hearing evaluation within the past year *Unable to fully assess hearing and vision during telephone visit type  Cognitive Function: 6CIT Screen 11/19/2019  What Year? 0 points  What month? 0 points  What time? 0 points  Count back from 20 4 points  Months in reverse 4 points  Repeat phrase 10 points  Total Score 18   (Normal:0-7, Significant for Dysfunction: >8)  Normal Cognitive Function Screening: No: pt states she could have answered them correctly in person, recommend repeat MMSE at next visit with PCP 11/22/19   Immunization & Health Maintenance Record Immunization History  Administered Date(s) Administered  . Fluad Quad(high Dose 65+) 07/31/2019  . Influenza Whole 06/18/2010  . Influenza, High Dose Seasonal PF 08/19/2016, 08/02/2017, 08/01/2018  . Influenza,inj,Quad PF,6+ Mos 07/23/2013, 07/17/2014, 08/06/2015  . Pneumococcal Conjugate-13 11/05/2013  . Pneumococcal Polysaccharide-23 10/18/2004  . Td 08/18/2010  . Tdap 08/12/2011  . Zoster 08/26/2010    Health Maintenance  Topic Date Due  . DEXA SCAN  08/23/2020 (Originally 02/02/2019)  . MAMMOGRAM  11/29/2019  . TETANUS/TDAP  08/11/2021  . INFLUENZA VACCINE  Completed  . PNA vac Low Risk Adult  Completed       Assessment  This is a routine wellness examination for ANIELLA PLATTE.  Health Maintenance: Due or Overdue There are no preventive care reminders to display for this patient.  [name] does not need a referral for Community Assistance: Care Management:   no Social Work:    no Prescription  Assistance:  no Nutrition/Diabetes Education:  no   Plan:  Personalized Goals Goals Addressed            This Visit's Progress   . DIET - INCREASE WATER INTAKE       Try to drink 6-8 glasses of water daily      Personalized Health Maintenance & Screening Recommendations  She is up to date on all recommended health screenings  Lung Cancer Screening Recommended: no (Low Dose CT Chest recommended if Age 4-80 years, 30 pack-year currently smoking OR have quit w/in past 15 years) Hepatitis C Screening recommended: no HIV Screening recommended: no  Advanced Directives: Written information was not prepared per patient's request.  Referrals & Orders No orders of the defined types were placed in this encounter.   Follow-up Plan . Follow-up with [name] as planned   I have personally reviewed and noted the following in the patient's chart:   . Medical and social history . Use of alcohol, tobacco or illicit drugs  . Current medications and supplements . Functional ability and status . Nutritional status . Physical activity . Advanced directives . List of other physicians . Hospitalizations, surgeries, and ER visits in previous 12 months . Vitals . Screenings to include cognitive, depression, and falls . Referrals and appointments  In addition, I have reviewed and discussed with [name] certain preventive protocols, quality metrics, and best practice recommendations. A written personalized care plan for preventive services as well as general preventive health recommendations is available and can  be mailed to the patient at her request.      Milas Hock, LPN  [phone]

## 2019-11-19 NOTE — Patient Instructions (Signed)
Preventive Care 38 Years and Older, Female Preventive care refers to lifestyle choices and visits with your health care provider that can promote health and wellness. This includes:  A yearly physical exam. This is also called an annual well check.  Regular dental and eye exams.  Immunizations.  Screening for certain conditions.  Healthy lifestyle choices, such as diet and exercise. What can I expect for my preventive care visit? Physical exam Your health care provider will check:  Height and weight. These may be used to calculate body mass index (BMI), which is a measurement that tells if you are at a healthy weight.  Heart rate and blood pressure.  Your skin for abnormal spots. Counseling Your health care provider may ask you questions about:  Alcohol, tobacco, and drug use.  Emotional well-being.  Home and relationship well-being.  Sexual activity.  Eating habits.  History of falls.  Memory and ability to understand (cognition).  Work and work Statistician.  Pregnancy and menstrual history. What immunizations do I need?  Influenza (flu) vaccine  This is recommended every year. Tetanus, diphtheria, and pertussis (Tdap) vaccine  You may need a Td booster every 10 years. Varicella (chickenpox) vaccine  You may need this vaccine if you have not already been vaccinated. Zoster (shingles) vaccine  You may need this after age 33. Pneumococcal conjugate (PCV13) vaccine  One dose is recommended after age 33. Pneumococcal polysaccharide (PPSV23) vaccine  One dose is recommended after age 72. Measles, mumps, and rubella (MMR) vaccine  You may need at least one dose of MMR if you were born in 1957 or later. You may also need a second dose. Meningococcal conjugate (MenACWY) vaccine  You may need this if you have certain conditions. Hepatitis A vaccine  You may need this if you have certain conditions or if you travel or work in places where you may be exposed  to hepatitis A. Hepatitis B vaccine  You may need this if you have certain conditions or if you travel or work in places where you may be exposed to hepatitis B. Haemophilus influenzae type b (Hib) vaccine  You may need this if you have certain conditions. You may receive vaccines as individual doses or as more than one vaccine together in one shot (combination vaccines). Talk with your health care provider about the risks and benefits of combination vaccines. What tests do I need? Blood tests  Lipid and cholesterol levels. These may be checked every 5 years, or more frequently depending on your overall health.  Hepatitis C test.  Hepatitis B test. Screening  Lung cancer screening. You may have this screening every year starting at age 39 if you have a 30-pack-year history of smoking and currently smoke or have quit within the past 15 years.  Colorectal cancer screening. All adults should have this screening starting at age 36 and continuing until age 15. Your health care provider may recommend screening at age 23 if you are at increased risk. You will have tests every 1-10 years, depending on your results and the type of screening test.  Diabetes screening. This is done by checking your blood sugar (glucose) after you have not eaten for a while (fasting). You may have this done every 1-3 years.  Mammogram. This may be done every 1-2 years. Talk with your health care provider about how often you should have regular mammograms.  BRCA-related cancer screening. This may be done if you have a family history of breast, ovarian, tubal, or peritoneal cancers.  Other tests  Sexually transmitted disease (STD) testing.  Bone density scan. This is done to screen for osteoporosis. You may have this done starting at age 44. Follow these instructions at home: Eating and drinking  Eat a diet that includes fresh fruits and vegetables, whole grains, lean protein, and low-fat dairy products. Limit  your intake of foods with high amounts of sugar, saturated fats, and salt.  Take vitamin and mineral supplements as recommended by your health care provider.  Do not drink alcohol if your health care provider tells you not to drink.  If you drink alcohol: ? Limit how much you have to 0-1 drink a day. ? Be aware of how much alcohol is in your drink. In the [place] one drink equals one 12 oz bottle of beer (355 mL), one 5 oz glass of wine (148 mL), or one 1 oz glass of hard liquor (44 mL). Lifestyle  Take daily care of your teeth and gums.  Stay active. Exercise for at least 30 minutes on 5 or more days each week.  Do not use any products that contain nicotine or tobacco, such as cigarettes, e-cigarettes, and chewing tobacco. If you need help quitting, ask your health care provider.  If you are sexually active, practice safe sex. Use a condom or other form of protection in order to prevent STIs (sexually transmitted infections).  Talk with your health care provider about taking a low-dose aspirin or statin. What's next?  Go to your health care provider once a year for a well check visit.  Ask your health care provider how often you should have your eyes and teeth checked.  Stay up to date on all vaccines. This information is not intended to replace advice given to you by your health care provider. Make sure you discuss any questions you have with your health care provider. Document Revised: 09/28/2018 Document Reviewed: 09/28/2018 Elsevier Patient Education  2020 [employer].

## 2019-11-21 ENCOUNTER — Other Ambulatory Visit: Payer: Self-pay

## 2019-11-22 ENCOUNTER — Encounter: Payer: Self-pay | Admitting: Family Medicine

## 2019-11-22 ENCOUNTER — Ambulatory Visit (INDEPENDENT_AMBULATORY_CARE_PROVIDER_SITE_OTHER): Payer: Medicare HMO | Admitting: Family Medicine

## 2019-11-22 VITALS — BP 132/80 | HR 94 | Temp 97.5°F | Ht 61.0 in | Wt 122.4 lb

## 2019-11-22 DIAGNOSIS — E034 Atrophy of thyroid (acquired): Secondary | ICD-10-CM | POA: Diagnosis not present

## 2019-11-22 DIAGNOSIS — R7309 Other abnormal glucose: Secondary | ICD-10-CM

## 2019-11-22 DIAGNOSIS — N1832 Chronic kidney disease, stage 3b: Secondary | ICD-10-CM | POA: Diagnosis not present

## 2019-11-22 DIAGNOSIS — I10 Essential (primary) hypertension: Secondary | ICD-10-CM | POA: Diagnosis not present

## 2019-11-22 DIAGNOSIS — E782 Mixed hyperlipidemia: Secondary | ICD-10-CM | POA: Diagnosis not present

## 2019-11-22 DIAGNOSIS — K219 Gastro-esophageal reflux disease without esophagitis: Secondary | ICD-10-CM

## 2019-11-22 LAB — BAYER DCA HB A1C WAIVED: HB A1C (BAYER DCA - WAIVED): 6 % (ref <7.0)

## 2019-11-22 MED ORDER — HYDROCHLOROTHIAZIDE 25 MG PO TABS: 25.0000 mg | ORAL_TABLET | Freq: Every day | ORAL | 3 refills | Status: DC

## 2019-11-22 MED ORDER — OXAZEPAM 15 MG PO CAPS: 15.0000 mg | ORAL_CAPSULE | Freq: Three times a day (TID) | ORAL | 2 refills | Status: DC | PRN

## 2019-11-22 MED ORDER — OMEPRAZOLE 40 MG PO CPDR: DELAYED_RELEASE_CAPSULE | ORAL | 3 refills | Status: DC

## 2019-11-22 NOTE — Progress Notes (Signed)
BP 132/80   Pulse 94   Temp (!) 97.5 F (36.4 C) (Temporal)   Ht _0  (1.549 m)   Wt 122 lb 6.4 oz (55.5 kg)   SpO2 98%   BMI 23.13 kg/m    Subjective:   Patient ID: [name], female    DOB: [birth year], 79 y.o.   MRN: [id number]  HPI: [name] is a 79 y.o. female presenting on 11/22/2019 for prediabetes (3 month follow up) and Hypothyroidism   HPI Prediabetes Patient comes in today for recheck of his diabetes. Patient has been currently taking no medication and A1c is 6.0. Patient is not currently on an ACE inhibitor/ARB. Patient has not seen an ophthalmologist this year. Patient denies any issues with their feet.   Hypertension Patient is currently on hydrochlorothiazide and metoprolol, and their blood pressure today is 132/80. Patient denies any lightheadedness or dizziness. Patient denies headaches, blurred vision, chest pains, shortness of breath, or weakness. Denies any side effects from medication and is content with current medication.  Patient has stage III CKD and we are monitoring  Anxiety Current rx-oxazepam 15 mg 3 times daily as needed # meds rx-90 Effectiveness of current meds-works well, she is trying to reduce down to 2 Adverse reactions form meds-none  Pill count performed-No Last drug screen -09/05/2019 ( high risk q106m moderate risk q61mlow risk yearly ) Urine drug screen today- No Was the NCOsmondeviewed-yes  If yes were their any concerning findings? -None  No flowsheet data found.   Controlled substance contract signed on: 09/05/2019  GERD Patient is currently on omeprazole.  She denies any major symptoms or abdominal pain or belching or burping. She denies any blood in her stool or lightheadedness or dizziness.   Hypothyroidism recheck Patient is coming in for thyroid recheck today as well. They deny any issues with hair changes or heat or cold problems or diarrhea or constipation. They deny any chest pain or palpitations. They are  currently on levothyroxine 10026mograms   Relevant past medical, surgical, family and social history reviewed and updated as indicated. Interim medical history since our last visit reviewed. Allergies and medications reviewed and updated.  Review of Systems  Constitutional: Negative for chills and fever.  Eyes: Negative for visual disturbance.  Respiratory: Negative for chest tightness and shortness of breath.   Cardiovascular: Negative for chest pain and leg swelling.  Musculoskeletal: Negative for back pain and gait problem.  Skin: Negative for rash.  Neurological: Negative for dizziness, light-headedness and headaches.  Psychiatric/Behavioral: Negative for agitation, behavioral problems, dysphoric mood, self-injury, sleep disturbance and suicidal ideas. The patient is nervous/anxious.   All other systems reviewed and are negative.   Per HPI unless specifically indicated above   Allergies as of 11/22/2019      Reactions   Bisphosphonates Other (See Comments)   Difficulty swallowing   Ciprofloxacin Nausea And Vomiting   Codeine Nausea And Vomiting   Sulfa Antibiotics Nausea And Vomiting      Medication List       Accurate as of November 22, 2019  3:16 PM. If you have any questions, ask your nurse or doctor.        acetaminophen 500 MG tablet Commonly known as: TYLENOL Take 500 mg by mouth every 8 (eight) hours as needed for mild pain or moderate pain.   atorvastatin 80 MG tablet Commonly known as: LIPITOR Take 1 tablet (80 mg total) by mouth daily.   Calcium-Phosphorus-Vitamin D 250-115-250 MG-MG-UNIT Chew  Commonly known as: Citracal Calcium Gummies Chew 1 each by mouth 2 (two) times daily.   furosemide 20 MG tablet Commonly known as: LASIX Take 20 mg by mouth each day   hydrochlorothiazide 25 MG tablet Commonly known as: HYDRODIURIL Take 1 tablet (25 mg total) by mouth daily.   levothyroxine 100 MCG tablet Commonly known as: SYNTHROID Take 0.5 tablets (50 mcg  total) by mouth every Monday, Tuesday, Wednesday, Thursday, and Friday.   metoprolol tartrate 25 MG tablet Commonly known as: LOPRESSOR Take 1 tablet (25 mg total) by mouth 2 (two) times daily as needed.   multivitamin with minerals Tabs tablet Take 2 tablets by mouth daily.   omeprazole 40 MG capsule Commonly known as: PRILOSEC TAKE ONE (1) CAPSULE EACH DAY   oxazepam 15 MG capsule Commonly known as: SERAX Take 1 capsule (15 mg total) by mouth 3 (three) times daily as needed for sleep.   polyethylene glycol 17 g packet Commonly known as: MIRALAX / GLYCOLAX Take 17 g by mouth daily as needed for moderate constipation.   potassium chloride 20 MEQ/15ML (10%) Soln Take 7.5 mLs (10 mEq total) by mouth daily as needed (for cramping).   sucralfate 1 g tablet Commonly known as: CARAFATE Take 1 tablet (1 g total) by mouth 2 (two) times daily.   Vitamin D3 50 MCG (2000 UT) Chew Chew 4,000 Units by mouth daily.        Objective:   BP (!) 155/93   Pulse 94   Temp (!) 97.5 F (36.4 C) (Temporal)   Ht _0  (1.549 m)   Wt 122 lb 6.4 oz (55.5 kg)   SpO2 98%   BMI 23.13 kg/m   Wt Readings from Last 3 Encounters:  11/22/19 122 lb 6.4 oz (55.5 kg)  08/24/19 123 lb (55.8 kg)  11/16/18 123 lb (55.8 kg)    Physical Exam Vitals and nursing note reviewed.  Constitutional:      General: She is not in acute distress.    Appearance: She is well-developed. She is not diaphoretic.  Eyes:     Conjunctiva/sclera: Conjunctivae normal.  Cardiovascular:     Rate and Rhythm: Normal rate and regular rhythm.     Heart sounds: Normal heart sounds. No murmur.  Pulmonary:     Effort: Pulmonary effort is normal. No respiratory distress.     Breath sounds: Normal breath sounds. No wheezing.  Skin:    General: Skin is warm and dry.     Findings: No rash.  Neurological:     Mental Status: She is alert and oriented to person, place, and time.     Coordination: Coordination normal.   Psychiatric:        Behavior: Behavior normal.       Assessment & Plan:   Problem List Items Addressed This Visit      Cardiovascular and Mediastinum   Essential hypertension   Relevant Medications   hydrochlorothiazide (HYDRODIURIL) 25 MG tablet   Other Relevant Orders   CMP14+EGFR     Digestive   GERD (gastroesophageal reflux disease)   Relevant Medications   omeprazole (PRILOSEC) 40 MG capsule   Other Relevant Orders   CBC with Differential/Platelet   CMP14+EGFR     Endocrine   Hypothyroid   Relevant Orders   TSH     Genitourinary   Chronic kidney disease (CKD) stage G3b/A1, moderately decreased glomerular filtration rate (GFR) between 30-44 mL/min/1.73 square meter and albuminuria creatinine ratio less than 30 mg/g  Other   Hyperlipemia   Relevant Medications   hydrochlorothiazide (HYDRODIURIL) 25 MG tablet   Other Relevant Orders   Lipid panel    Other Visit Diagnoses    Elevated glucose    -  Primary   Relevant Orders   Bayer DCA Hb A1c Waived      Continue oxazepam for the patient for anxiety  A1c is 6.0  Continue blood pressure medication currently  Follow up plan: Return in about 3 months (around 02/19/2020), or if symptoms worsen or fail to improve, for Anxiety recheck.  Counseling provided for all of the vaccine components Orders Placed This Encounter  Procedures  . Bayer DCA Hb A1c Waived  . CBC with Differential/Platelet  . CMP14+EGFR  . Lipid panel    Caryl Pina, MD [facility] 11/22/2019, 3:16 PM

## 2019-11-23 LAB — CBC WITH DIFFERENTIAL/PLATELET
Basophils Absolute: 0.1 10*3/uL (ref 0.0–0.2)
Basos: 1 %
EOS (ABSOLUTE): 0.2 10*3/uL (ref 0.0–0.4)
Eos: 3 %
Hematocrit: 39.5 % (ref 34.0–46.6)
Hemoglobin: 13.3 g/dL (ref 11.1–15.9)
Immature Grans (Abs): 0 10*3/uL (ref 0.0–0.1)
Immature Granulocytes: 0 %
Lymphocytes Absolute: 2.1 10*3/uL (ref 0.7–3.1)
Lymphs: 29 %
MCH: 30.3 pg (ref 26.6–33.0)
MCHC: 33.7 g/dL (ref 31.5–35.7)
MCV: 90 fL (ref 79–97)
Monocytes Absolute: 0.6 10*3/uL (ref 0.1–0.9)
Monocytes: 8 %
Neutrophils Absolute: 4.2 10*3/uL (ref 1.4–7.0)
Neutrophils: 59 %
Platelets: 265 10*3/uL (ref 150–450)
RBC: 4.39 x10E6/uL (ref 3.77–5.28)
RDW: 12.3 % (ref 11.7–15.4)
WBC: 7.1 10*3/uL (ref 3.4–10.8)

## 2019-11-23 LAB — CMP14+EGFR
ALT: 13 IU/L (ref 0–32)
AST: 23 IU/L (ref 0–40)
Albumin/Globulin Ratio: 1.5 (ref 1.2–2.2)
Albumin: 4.3 g/dL (ref 3.7–4.7)
Alkaline Phosphatase: 119 IU/L — ABNORMAL HIGH (ref 39–117)
BUN/Creatinine Ratio: 18 (ref 12–28)
BUN: 22 mg/dL (ref 8–27)
Bilirubin Total: 0.7 mg/dL (ref 0.0–1.2)
CO2: 27 mmol/L (ref 20–29)
Calcium: 9.5 mg/dL (ref 8.7–10.3)
Chloride: 98 mmol/L (ref 96–106)
Creatinine, Ser: 1.21 mg/dL — ABNORMAL HIGH (ref 0.57–1.00)
GFR calc Af Amer: 49 mL/min/{1.73_m2} — ABNORMAL LOW (ref >59)
GFR calc non Af Amer: 42 mL/min/{1.73_m2} — ABNORMAL LOW (ref >59)
Globulin, Total: 2.8 g/dL (ref 1.5–4.5)
Glucose: 111 mg/dL — ABNORMAL HIGH (ref 65–99)
Potassium: 3.1 mmol/L — ABNORMAL LOW (ref 3.5–5.2)
Sodium: 142 mmol/L (ref 134–144)
Total Protein: 7.1 g/dL (ref 6.0–8.5)

## 2019-11-23 LAB — LIPID PANEL
Chol/HDL Ratio: 3.3 ratio (ref 0.0–4.4)
Cholesterol, Total: 150 mg/dL (ref 100–199)
HDL: 45 mg/dL (ref >39)
LDL Chol Calc (NIH): 79 mg/dL (ref 0–99)
Triglycerides: 150 mg/dL — ABNORMAL HIGH (ref 0–149)
VLDL Cholesterol Cal: 26 mg/dL (ref 5–40)

## 2019-11-23 LAB — TSH: TSH: 0.596 u[IU]/mL (ref 0.450–4.500)

## 2019-12-05 ENCOUNTER — Telehealth: Payer: Self-pay

## 2019-12-05 MED ORDER — TRIAMCINOLONE ACETONIDE 0.5 % EX OINT: 1.0000 "application " | TOPICAL_OINTMENT | Freq: Two times a day (BID) | CUTANEOUS | 2 refills | Status: DC

## 2019-12-05 NOTE — Telephone Encounter (Signed)
[facility] pharmacy requesting refill on triamcinolone acetonide 0.5% topical cream

## 2019-12-05 NOTE — Telephone Encounter (Signed)
I sent a refill of her triamcinolone for the patient to [facility]

## 2019-12-05 NOTE — Telephone Encounter (Signed)
Left detailed message.   

## 2020-01-28 ENCOUNTER — Other Ambulatory Visit: Payer: Self-pay | Admitting: Family Medicine

## 2020-02-05 DIAGNOSIS — L570 Actinic keratosis: Secondary | ICD-10-CM | POA: Diagnosis not present

## 2020-02-21 DIAGNOSIS — H40033 Anatomical narrow angle, bilateral: Secondary | ICD-10-CM | POA: Diagnosis not present

## 2020-02-21 DIAGNOSIS — Z0100 Encounter for examination of eyes and vision without abnormal findings: Secondary | ICD-10-CM | POA: Diagnosis not present

## 2020-02-21 DIAGNOSIS — H2513 Age-related nuclear cataract, bilateral: Secondary | ICD-10-CM | POA: Diagnosis not present

## 2020-02-25 ENCOUNTER — Ambulatory Visit (INDEPENDENT_AMBULATORY_CARE_PROVIDER_SITE_OTHER): Payer: Medicare HMO | Admitting: Family Medicine

## 2020-02-25 ENCOUNTER — Encounter: Payer: Self-pay | Admitting: Family Medicine

## 2020-02-25 ENCOUNTER — Other Ambulatory Visit: Payer: Self-pay

## 2020-02-25 VITALS — BP 138/76 | HR 80 | Temp 97.3°F | Ht 61.0 in | Wt 124.5 lb

## 2020-02-25 DIAGNOSIS — E034 Atrophy of thyroid (acquired): Secondary | ICD-10-CM | POA: Diagnosis not present

## 2020-02-25 DIAGNOSIS — N1832 Chronic kidney disease, stage 3b: Secondary | ICD-10-CM

## 2020-02-25 DIAGNOSIS — E782 Mixed hyperlipidemia: Secondary | ICD-10-CM | POA: Diagnosis not present

## 2020-02-25 DIAGNOSIS — E035 Myxedema coma: Secondary | ICD-10-CM | POA: Diagnosis not present

## 2020-02-25 DIAGNOSIS — K219 Gastro-esophageal reflux disease without esophagitis: Secondary | ICD-10-CM

## 2020-02-25 DIAGNOSIS — Z79891 Long term (current) use of opiate analgesic: Secondary | ICD-10-CM | POA: Diagnosis not present

## 2020-02-25 DIAGNOSIS — R7303 Prediabetes: Secondary | ICD-10-CM | POA: Diagnosis not present

## 2020-02-25 DIAGNOSIS — I10 Essential (primary) hypertension: Secondary | ICD-10-CM | POA: Diagnosis not present

## 2020-02-25 DIAGNOSIS — F411 Generalized anxiety disorder: Secondary | ICD-10-CM | POA: Diagnosis not present

## 2020-02-25 LAB — BAYER DCA HB A1C WAIVED: HB A1C (BAYER DCA - WAIVED): 6.1 % (ref <7.0)

## 2020-02-25 MED ORDER — ATORVASTATIN CALCIUM 80 MG PO TABS: 80.0000 mg | ORAL_TABLET | Freq: Every day | ORAL | 1 refills | Status: DC

## 2020-02-25 MED ORDER — OXAZEPAM 15 MG PO CAPS: 15.0000 mg | ORAL_CAPSULE | Freq: Three times a day (TID) | ORAL | 2 refills | Status: DC | PRN

## 2020-02-25 MED ORDER — SUCRALFATE 1 G PO TABS: 1.0000 g | ORAL_TABLET | Freq: Two times a day (BID) | ORAL | 1 refills | Status: DC

## 2020-02-25 NOTE — Progress Notes (Signed)
BP 138/76   Pulse 80   Temp (!) 97.3 F (36.3 C)   Ht 5' 1"  (1.549 m)   Wt 124 lb 8 oz (56.5 kg)   SpO2 96%   BMI 23.52 kg/m    Subjective:   Patient ID: [name], female    DOB: 03-17-1941, 79 y.o.   MRN: [id number]  HPI: [name] is a 79 y.o. female presenting on 02/25/2020 for Medical Management of Chronic Issues (58mcheck)   HPI Prediabetes Patient comes in today for recheck of his diabetes. Patient has been currently taking no medication currently and A1c was 6.0 last time. Patient is not currently on an ACE inhibitor/ARB. Patient has not seen an ophthalmologist this year. Patient denies any issues with their feet. The symptom started onset as an adult hypertension and hyperlipidemia and CKD ARE RELATED TO DM   Hyperlipidemia Patient is coming in for recheck of his hyperlipidemia. The patient is currently taking atorvastatin. They deny any issues with myalgias or history of liver damage from it. They deny any focal numbness or weakness or chest pain.   Hypothyroidism recheck Patient is coming in for thyroid recheck today as well. They deny any issues with hair changes or heat or cold problems or diarrhea or constipation. They deny any chest pain or palpitations. They are currently on levothyroxine 1033mrograms   GERD Patient is currently on omeprazole.  She denies any major symptoms or abdominal pain or belching or burping. She denies any blood in her stool or lightheadedness or dizziness.   Anxiety Current rx-oxazepam 15 mg 3 times daily as needed # meds rx-90 Effectiveness of current meds-works well Adverse reactions form meds-none  Pill count performed-No Last drug screen -09/05/2019 ( high risk q3m36moderate risk q6m,25mw risk yearly ) Urine drug screen today- Yes Was the NCCSRobertsiewed-yes  If yes were their any concerning findings? -None  No flowsheet data found.   Controlled substance contract signed on: Today  Relevant past medical,  surgical, family and social history reviewed and updated as indicated. Interim medical history since our last visit reviewed. Allergies and medications reviewed and updated.  Review of Systems  Constitutional: Negative for chills and fever.  Eyes: Negative for visual disturbance.  Respiratory: Negative for chest tightness and shortness of breath.   Cardiovascular: Negative for chest pain and leg swelling.  Genitourinary: Negative for difficulty urinating and dysuria.  Musculoskeletal: Negative for back pain and gait problem.  Skin: Negative for rash.  Neurological: Negative for light-headedness and headaches.  Psychiatric/Behavioral: Negative for agitation and behavioral problems.  All other systems reviewed and are negative.   Per HPI unless specifically indicated above   Allergies as of 02/25/2020      Reactions   Bisphosphonates Other (See Comments)   Difficulty swallowing   Ciprofloxacin Nausea And Vomiting   Codeine Nausea And Vomiting   Sulfa Antibiotics Nausea And Vomiting      Medication List       Accurate as of Feb 25, 2020  9:01 AM. If you have any questions, ask your nurse or doctor.        acetaminophen 500 MG tablet Commonly known as: TYLENOL Take 500 mg by mouth every 8 (eight) hours as needed for mild pain or moderate pain.   atorvastatin 80 MG tablet Commonly known as: LIPITOR Take 1 tablet (80 mg total) by mouth daily.   Calcium-Phosphorus-Vitamin D 250-115-250 MG-MG-UNIT Chew Commonly known as: Citracal Calcium Gummies Chew 1 each by  mouth 2 (two) times daily.   furosemide 20 MG tablet Commonly known as: LASIX Take 20 mg by mouth each day   hydrochlorothiazide 25 MG tablet Commonly known as: HYDRODIURIL Take 1 tablet (25 mg total) by mouth daily.   levothyroxine 100 MCG tablet Commonly known as: SYNTHROID Take 0.5 tablets (50 mcg total) by mouth every Monday, Tuesday, Wednesday, Thursday, and Friday.   metoprolol tartrate 25 MG  tablet Commonly known as: LOPRESSOR Take 1 tablet (25 mg total) by mouth 2 (two) times daily as needed.   multivitamin with minerals Tabs tablet Take 2 tablets by mouth daily.   omeprazole 40 MG capsule Commonly known as: PRILOSEC TAKE ONE (1) CAPSULE EACH DAY   oxazepam 15 MG capsule Commonly known as: SERAX Take 1 capsule (15 mg total) by mouth 3 (three) times daily as needed for sleep.   polyethylene glycol 17 g packet Commonly known as: MIRALAX / GLYCOLAX Take 17 g by mouth daily as needed for moderate constipation.   potassium chloride 20 MEQ/15ML (10%) Soln Take 7.5 mLs (10 mEq total) by mouth daily as needed (for cramping).   sucralfate 1 g tablet Commonly known as: CARAFATE Take 1 tablet (1 g total) by mouth 2 (two) times daily.   triamcinolone ointment 0.5 % Commonly known as: KENALOG Apply 1 application topically 2 (two) times daily.   Vitamin D3 50 MCG (2000 UT) Chew Chew 4,000 Units by mouth daily.        Objective:   BP 138/76   Pulse 80   Temp (!) 97.3 F (36.3 C)   Ht 5' 1"  (1.549 m)   Wt 124 lb 8 oz (56.5 kg)   SpO2 96%   BMI 23.52 kg/m   Wt Readings from Last 3 Encounters:  02/25/20 124 lb 8 oz (56.5 kg)  11/22/19 122 lb 6.4 oz (55.5 kg)  08/24/19 123 lb (55.8 kg)    Physical Exam Vitals and nursing note reviewed.  Constitutional:      General: She is not in acute distress.    Appearance: She is well-developed. She is not diaphoretic.  Eyes:     Conjunctiva/sclera: Conjunctivae normal.  Cardiovascular:     Rate and Rhythm: Normal rate and regular rhythm.     Heart sounds: Normal heart sounds. No murmur.  Pulmonary:     Effort: Pulmonary effort is normal. No respiratory distress.     Breath sounds: Normal breath sounds. No wheezing.  Musculoskeletal:        General: No tenderness. Normal range of motion.  Skin:    General: Skin is warm and dry.     Findings: No rash.  Neurological:     Mental Status: She is alert and oriented to  person, place, and time.     Coordination: Coordination normal.  Psychiatric:        Behavior: Behavior normal.       Assessment & Plan:   Problem List Items Addressed This Visit      Cardiovascular and Mediastinum   Essential hypertension - Primary   Relevant Medications   atorvastatin (LIPITOR) 80 MG tablet     Digestive   GERD (gastroesophageal reflux disease)   Relevant Medications   sucralfate (CARAFATE) 1 g tablet     Endocrine   Hypothyroid   Relevant Orders   TSH     Genitourinary   Chronic kidney disease (CKD) stage G3b/A1, moderately decreased glomerular filtration rate (GFR) between 30-44 mL/min/1.73 square meter and albuminuria creatinine ratio less  than 30 mg/g     Other   Hyperlipemia   Relevant Medications   atorvastatin (LIPITOR) 80 MG tablet   Generalized anxiety disorder   Relevant Medications   oxazepam (SERAX) 15 MG capsule   Other Relevant Orders   ToxASSURE Select 13 (MW), Urine   Prediabetes   Relevant Orders   CMP14+EGFR   Bayer DCA Hb A1c Waived      Continue current medication, no changes. Follow up plan: Return in about 3 months (around 05/27/2020), or if symptoms worsen or fail to improve, for Anxiety and hypertension and prediabetes.  Counseling provided for all of the vaccine components Orders Placed This Encounter  Procedures  . CMP14+EGFR  . Bayer DCA Hb A1c Waived  . ToxASSURE Select 13 (MW), Urine  . TSH    Caryl Pina, MD [facility] 02/25/2020, 9:01 AM

## 2020-02-26 LAB — TSH: TSH: 1.03 u[IU]/mL (ref 0.450–4.500)

## 2020-02-26 LAB — CMP14+EGFR
ALT: 8 IU/L (ref 0–32)
AST: 18 IU/L (ref 0–40)
Albumin/Globulin Ratio: 1.6 (ref 1.2–2.2)
Albumin: 4.1 g/dL (ref 3.7–4.7)
Alkaline Phosphatase: 107 IU/L (ref 39–117)
BUN/Creatinine Ratio: 15 (ref 12–28)
BUN: 20 mg/dL (ref 8–27)
Bilirubin Total: 0.4 mg/dL (ref 0.0–1.2)
CO2: 25 mmol/L (ref 20–29)
Calcium: 9.5 mg/dL (ref 8.7–10.3)
Chloride: 101 mmol/L (ref 96–106)
Creatinine, Ser: 1.35 mg/dL — ABNORMAL HIGH (ref 0.57–1.00)
GFR calc Af Amer: 43 mL/min/{1.73_m2} — ABNORMAL LOW (ref >59)
GFR calc non Af Amer: 37 mL/min/{1.73_m2} — ABNORMAL LOW (ref >59)
Globulin, Total: 2.5 g/dL (ref 1.5–4.5)
Glucose: 94 mg/dL (ref 65–99)
Potassium: 3.6 mmol/L (ref 3.5–5.2)
Sodium: 144 mmol/L (ref 134–144)
Total Protein: 6.6 g/dL (ref 6.0–8.5)

## 2020-02-26 LAB — SPECIMEN STATUS REPORT

## 2020-02-28 LAB — TOXASSURE SELECT 13 (MW), URINE

## 2020-03-25 ENCOUNTER — Other Ambulatory Visit: Payer: Self-pay | Admitting: Family Medicine

## 2020-04-02 ENCOUNTER — Other Ambulatory Visit: Payer: Self-pay | Admitting: Family Medicine

## 2020-04-19 ENCOUNTER — Emergency Department (HOSPITAL_COMMUNITY)
Admission: EM | Admit: 2020-04-19 | Discharge: 2020-04-19 | Disposition: A | Discharge status: Home / Self Care | Payer: Medicare HMO | Attending: Emergency Medicine | Admitting: Emergency Medicine

## 2020-04-19 ENCOUNTER — Other Ambulatory Visit: Payer: Self-pay

## 2020-04-19 ENCOUNTER — Encounter (HOSPITAL_COMMUNITY): Payer: Self-pay | Admitting: Emergency Medicine

## 2020-04-19 ENCOUNTER — Emergency Department (HOSPITAL_COMMUNITY): Payer: Medicare HMO

## 2020-04-19 DIAGNOSIS — Z79899 Other long term (current) drug therapy: Secondary | ICD-10-CM | POA: Diagnosis not present

## 2020-04-19 DIAGNOSIS — M4846XD Fatigue fracture of vertebra, lumbar region, subsequent encounter for fracture with routine healing: Secondary | ICD-10-CM

## 2020-04-19 DIAGNOSIS — M4856XA Collapsed vertebra, not elsewhere classified, lumbar region, initial encounter for fracture: Secondary | ICD-10-CM | POA: Diagnosis not present

## 2020-04-19 DIAGNOSIS — Z87891 Personal history of nicotine dependence: Secondary | ICD-10-CM | POA: Insufficient documentation

## 2020-04-19 DIAGNOSIS — I700 Atherosclerosis of aorta: Secondary | ICD-10-CM | POA: Diagnosis not present

## 2020-04-19 DIAGNOSIS — R7303 Prediabetes: Secondary | ICD-10-CM | POA: Diagnosis not present

## 2020-04-19 DIAGNOSIS — K5730 Diverticulosis of large intestine without perforation or abscess without bleeding: Secondary | ICD-10-CM | POA: Diagnosis not present

## 2020-04-19 DIAGNOSIS — Z85828 Personal history of other malignant neoplasm of skin: Secondary | ICD-10-CM | POA: Diagnosis not present

## 2020-04-19 DIAGNOSIS — N1832 Chronic kidney disease, stage 3b: Secondary | ICD-10-CM | POA: Diagnosis not present

## 2020-04-19 DIAGNOSIS — R109 Unspecified abdominal pain: Secondary | ICD-10-CM | POA: Diagnosis not present

## 2020-04-19 DIAGNOSIS — R1013 Epigastric pain: Secondary | ICD-10-CM | POA: Diagnosis not present

## 2020-04-19 DIAGNOSIS — I129 Hypertensive chronic kidney disease with stage 1 through stage 4 chronic kidney disease, or unspecified chronic kidney disease: Secondary | ICD-10-CM | POA: Diagnosis not present

## 2020-04-19 DIAGNOSIS — E039 Hypothyroidism, unspecified: Secondary | ICD-10-CM | POA: Insufficient documentation

## 2020-04-19 DIAGNOSIS — N3289 Other specified disorders of bladder: Secondary | ICD-10-CM | POA: Diagnosis not present

## 2020-04-19 DIAGNOSIS — I10 Essential (primary) hypertension: Secondary | ICD-10-CM | POA: Diagnosis not present

## 2020-04-19 LAB — COMPREHENSIVE METABOLIC PANEL
ALT: 15 U/L (ref 0–44)
AST: 26 U/L (ref 15–41)
Albumin: 4.4 g/dL (ref 3.5–5.0)
Alkaline Phosphatase: 113 U/L (ref 38–126)
Anion gap: 11 (ref 5–15)
BUN: 17 mg/dL (ref 8–23)
CO2: 30 mmol/L (ref 22–32)
Calcium: 9 mg/dL (ref 8.9–10.3)
Chloride: 97 mmol/L — ABNORMAL LOW (ref 98–111)
Creatinine, Ser: 1.34 mg/dL — ABNORMAL HIGH (ref 0.44–1.00)
GFR calc Af Amer: 43 mL/min — ABNORMAL LOW (ref >60)
GFR calc non Af Amer: 37 mL/min — ABNORMAL LOW (ref >60)
Glucose, Bld: 104 mg/dL — ABNORMAL HIGH (ref 70–99)
Potassium: 3.2 mmol/L — ABNORMAL LOW (ref 3.5–5.1)
Sodium: 138 mmol/L (ref 135–145)
Total Bilirubin: 0.9 mg/dL (ref 0.3–1.2)
Total Protein: 8.1 g/dL (ref 6.5–8.1)

## 2020-04-19 LAB — CBC WITH DIFFERENTIAL/PLATELET
Abs Immature Granulocytes: 0.01 10*3/uL (ref 0.00–0.07)
Basophils Absolute: 0.1 10*3/uL (ref 0.0–0.1)
Basophils Relative: 1 %
Eosinophils Absolute: 0.1 10*3/uL (ref 0.0–0.5)
Eosinophils Relative: 2 %
HCT: 42.3 % (ref 36.0–46.0)
Hemoglobin: 13.4 g/dL (ref 12.0–15.0)
Immature Granulocytes: 0 %
Lymphocytes Relative: 23 %
Lymphs Abs: 1.5 10*3/uL (ref 0.7–4.0)
MCH: 30 pg (ref 26.0–34.0)
MCHC: 31.7 g/dL (ref 30.0–36.0)
MCV: 94.6 fL (ref 80.0–100.0)
Monocytes Absolute: 0.6 10*3/uL (ref 0.1–1.0)
Monocytes Relative: 8 %
Neutro Abs: 4.3 10*3/uL (ref 1.7–7.7)
Neutrophils Relative %: 66 %
Platelets: 263 10*3/uL (ref 150–400)
RBC: 4.47 MIL/uL (ref 3.87–5.11)
RDW: 13 % (ref 11.5–15.5)
WBC: 6.5 10*3/uL (ref 4.0–10.5)
nRBC: 0 % (ref 0.0–0.2)

## 2020-04-19 LAB — TROPONIN I (HIGH SENSITIVITY): Troponin I (High Sensitivity): 6 ng/L (ref <18)

## 2020-04-19 LAB — URINALYSIS, ROUTINE W REFLEX MICROSCOPIC
Bilirubin Urine: NEGATIVE
Glucose, UA: NEGATIVE mg/dL
Hgb urine dipstick: NEGATIVE
Ketones, ur: NEGATIVE mg/dL
Leukocytes,Ua: NEGATIVE
Nitrite: NEGATIVE
Protein, ur: NEGATIVE mg/dL
Specific Gravity, Urine: 1.011 (ref 1.005–1.030)
pH: 6 (ref 5.0–8.0)

## 2020-04-19 LAB — LIPASE, BLOOD: Lipase: 42 U/L (ref 11–51)

## 2020-04-19 MED ORDER — IOHEXOL 300 MG/ML  SOLN
75.0000 mL | Freq: Once | INTRAMUSCULAR | Status: AC | PRN
  Administered 2020-04-19: 75 mL via INTRAVENOUS

## 2020-04-19 NOTE — ED Notes (Signed)
Patient transported to CT 

## 2020-04-19 NOTE — Discharge Instructions (Addendum)
Please bring these papers to your next doctor's office appointment   In the ER today you had a CT scan and bloodwork done to look for a source of your abdominal pain.  Your pain has been ongoing for many months.  Your CT scan and blood tests did NOT show any signs of gallbladder stones or inflammation in your abdomen.  Your bloodtests were reassuring.  I included copies of both to bring to your doctor.  I think your pain may be related to your spinal fractures.  It is possible to get pain around your abdomen from your spine.  Your doctor may want to consider an MRI of your thoracic and lumbar spine if you continue having pain.  If your doctor remains concerned that you may have a gallbladder problem, I would recommend they order an ultrasound.  This is more sensitive for looking at your gallbladder than a CT scan.  We did not have ultrasound available in our ER during your visit.  However I did feel confident that you did not have a gall bladder surgical emergency on your visit today.   You can take tylenol 650 mg every 6 hours at home for pain.

## 2020-04-19 NOTE — ED Triage Notes (Signed)
Pt C/O abdominal pain and nausea X 3 days. Denies fevers.

## 2020-04-19 NOTE — ED Provider Notes (Signed)
[facility] EMERGENCY DEPARTMENT Provider Note   CSN: [id number] Arrival date & time: 04/19/20  1922     History Chief Complaint  Patient presents with   Abdominal Pain    [name] is a 79 y.o. female with a history of esophageal dilation, presented emergency department with abdominal pain.  She reports a many month history of intermittent episodes of sharp midepigastric and right upper quadrant abdominal pain.  These seem to occur at random are not associated with eating.  Typically they resolve with time and Tylenol.  However for the past 3 days her symptoms have worsened.  This prompted her visit to the ER today.  She also describes nausea earlier today and diminished appetite.  She describes a sharp pain in her right upper quadrant.  Pain is worse with standing and movement, better at rest.  It was mildly improved with Tylenol.  It is currently 5 out of 10  She thinks may be having gallbladder problems.  She is unaware if there is any prior history of gallstones.  She reports she is having regular bowel movements.  These are nonbloody.  There is no diarrhea.  She denies any vomiting.  She denies any fevers or chills.  She denies any dysuria.  She denies any falls, trauma, or changes in her chronic back pain.  HPI     Past Medical History:  Diagnosis Date   Anxiety states    Breast cyst    Cancer ([place])    skin   Cataract    Constipation    Esophageal stricture    Essential hypertension 12/18/2015   Fracture, rib    GERD (gastroesophageal reflux disease)    Hiatal hernia    Lower extremity edema 12/18/2015   Osteoporosis    Other and unspecified hyperlipidemia    PAC (premature atrial contraction) 12/30/2015   Palpitations 12/18/2015   Unspecified hypothyroidism    Vertebral fracture, osteoporotic ([place])     Patient Active Problem List   Diagnosis Date Noted   Prediabetes 02/25/2020   Chronic kidney disease (CKD) stage G3b/A1, moderately decreased  glomerular filtration rate (GFR) between 30-44 mL/min/1.73 square meter and albuminuria creatinine ratio less than 30 mg/g 02/22/2018   Atherosclerosis of aorta ([place]) 02/21/2018   PAC (premature atrial contraction) 12/30/2015   Essential hypertension 12/18/2015   Lower extremity edema 12/18/2015   Palpitations 12/18/2015   Hyperlipemia 02/14/2013   Hypothyroid 02/14/2013   Vitamin D deficiency 02/14/2013   Generalized anxiety disorder 02/14/2013   Constipation    Esophageal stricture    Osteoporosis with pathological fracture    GERD (gastroesophageal reflux disease)     Past Surgical History:  Procedure Laterality Date   ABDOMINAL HYSTERECTOMY     APPENDECTOMY     BREAST BIOPSY     bil cysts   CATARACT EXTRACTION     ESOPHAGOGASTRODUODENOSCOPY N/A 01/19/2018   Procedure: ESOPHAGOGASTRODUODENOSCOPY (EGD);  Surgeon: Clarene Essex, MD;  Location: [facility];  Service: Endoscopy;  Laterality: N/A;  with flouro   EYE SURGERY     HAND SURGERY Left    IR GENERIC HISTORICAL  12/10/2016   IR RADIOLOGIST EVAL & MGMT 12/10/2016 [facility]   IR GENERIC HISTORICAL  12/23/2016   IR KYPHO THORACIC WITH BONE BIOPSY 12/23/2016 Luanne Bras, MD [facility]   PARTIAL HYSTERECTOMY     THYROID LOBECTOMY     right   VERTEBROPLASTY       OB History   No obstetric history on file.  Family History  Problem Relation Age of Onset   Goiter Father    Alcohol abuse Father    Heart disease Sister    Cancer Sister        breast and skin   Goiter Brother    Dementia Brother    Hyperlipidemia Brother    Hernia Brother    Cancer Brother        throat   Cancer Brother        neck   Heart disease Brother    Early death Brother        auto accident    Social History   Tobacco Use   Smoking status: Former Smoker    Types: Cigarettes    Quit date: 11/18/2005    Years since quitting: 14.4   Smokeless tobacco: Never Used   Tobacco comment:  Non-smoker  quit 12-13 years  Vaping Use   Vaping Use: Never used  Substance Use Topics   Alcohol use: No   Drug use: No    Home Medications Prior to Admission medications   Medication Sig Start Date End Date Taking? Authorizing Provider  acetaminophen (TYLENOL) 500 MG tablet Take 500 mg by mouth every 8 (eight) hours as needed for mild pain or moderate pain.   Yes [provider]  atorvastatin (LIPITOR) 80 MG tablet Take 1 tablet (80 mg total) by mouth daily. 02/25/20  Yes Dettinger, Fransisca Kaufmann, MD  Calcium-Phosphorus-Vitamin D ([place]) [phone] MG-MG-UNIT CHEW Chew 1 each by mouth 2 (two) times daily. 02/16/17  Yes Cherre Robins, PharmD  Cholecalciferol (VITAMIN D3) 2000 units CHEW Chew 4,000 Units by mouth daily.    Yes [provider]  furosemide (LASIX) 20 MG tablet Take 20 mg by mouth each day 11/06/19  Yes Dettinger, Fransisca Kaufmann, MD  hydrochlorothiazide (HYDRODIURIL) 25 MG tablet TAKE 1 TABLET (25 MG TOTAL) BY MOUTH DAILY. 03/25/20  Yes Dettinger, Fransisca Kaufmann, MD  levothyroxine (SYNTHROID) 100 MCG tablet Take 0.5 tablets (50 mcg total) by mouth every Monday, Tuesday, Wednesday, Thursday, and Friday. 11/06/19  Yes Dettinger, Fransisca Kaufmann, MD  metoprolol tartrate (LOPRESSOR) 25 MG tablet Take 1 tablet (25 mg total) by mouth 2 (two) times daily as needed. 02/22/18  Yes Minus Breeding, MD  Multiple Vitamin (MULTIVITAMIN WITH MINERALS) TABS tablet Take 2 tablets by mouth daily.    Yes [provider]  omeprazole (PRILOSEC) 40 MG capsule TAKE ONE (1) CAPSULE EACH DAY 11/22/19  Yes Dettinger, Fransisca Kaufmann, MD  oxazepam (SERAX) 15 MG capsule Take 1 capsule (15 mg total) by mouth 3 (three) times daily as needed for sleep. 02/25/20  Yes Dettinger, Fransisca Kaufmann, MD  polyethylene glycol (MIRALAX / GLYCOLAX) packet Take 17 g by mouth daily as needed for moderate constipation.    Yes [provider]  potassium chloride 20 MEQ/15ML (10%) SOLN TAKE 7.5ML (1&1/2 TEASPOONFUL)  BY MOUTH DAILY AS NEEDED FOR CRAMPING 04/03/20  Yes Dettinger, Fransisca Kaufmann, MD  sucralfate (CARAFATE) 1 g tablet Take 1 tablet (1 g total) by mouth 2 (two) times daily. 02/25/20  Yes Dettinger, Fransisca Kaufmann, MD  triamcinolone ointment (KENALOG) 0.5 % Apply 1 application topically 2 (two) times daily. 12/05/19  Yes Dettinger, Fransisca Kaufmann, MD    Allergies    Bisphosphonates, Ciprofloxacin, Codeine, and Sulfa antibiotics  Review of Systems   Review of Systems  Constitutional: Negative for chills and fever.  HENT: Negative for ear pain and sore throat.   Eyes: Negative for pain and visual disturbance.  Respiratory: Negative for cough and shortness of breath.   Cardiovascular: Negative for chest pain and palpitations.  Gastrointestinal: Positive for abdominal pain and nausea. Negative for constipation, diarrhea and vomiting.  Genitourinary: Negative for dysuria and hematuria.  Musculoskeletal: Positive for arthralgias and back pain.  Skin: Negative for color change and rash.  Allergic/Immunologic: Negative for immunocompromised state.  Neurological: Negative for syncope and headaches.  Psychiatric/Behavioral: Negative for confusion.  All other systems reviewed and are negative.   Physical Exam Updated Vital Signs BP 134/81    Pulse 83    Temp 98.2 F (36.8 C) (Oral)    Resp 18    Wt 56.2 kg    SpO2 97%    BMI 23.43 kg/m   Physical Exam Vitals and nursing note reviewed.  Constitutional:      General: She is not in acute distress.    Comments: Kyphosis of the spine  HENT:     Head: Normocephalic and atraumatic.  Eyes:     Conjunctiva/sclera: Conjunctivae normal.  Cardiovascular:     Rate and Rhythm: Normal rate and regular rhythm.     Heart sounds: Normal heart sounds.  Pulmonary:     Effort: Pulmonary effort is normal. No respiratory distress.     Breath sounds: Normal breath sounds.  Abdominal:     Palpations: Abdomen is soft.     Tenderness: There is abdominal tenderness in the  epigastric area. There is no guarding or rebound. Negative signs include Murphy's sign.  Musculoskeletal:     Cervical back: Neck supple.  Skin:    General: Skin is warm and dry.  Neurological:     General: No focal deficit present.     Mental Status: She is alert and oriented to person, place, and time.  Psychiatric:        Mood and Affect: Mood normal.        Behavior: Behavior normal.     ED Results / Procedures / Treatments   Labs (all labs ordered are listed, but only abnormal results are displayed) Labs Reviewed  COMPREHENSIVE METABOLIC PANEL - Abnormal; Notable for the following components:      Result Value   Potassium 3.2 (*)    Chloride 97 (*)    Glucose, Bld 104 (*)    Creatinine, Ser 1.34 (*)    GFR calc non Af Amer 37 (*)    GFR calc Af Amer 43 (*)    All other components within normal limits  LIPASE, BLOOD  CBC WITH DIFFERENTIAL/PLATELET  URINALYSIS, ROUTINE W REFLEX MICROSCOPIC  TROPONIN I (HIGH SENSITIVITY)    EKG EKG Interpretation  Date/Time:  Saturday April 19 2020 20:26:27 EDT Ventricular Rate:  73 PR Interval:    QRS Duration: 93 QT Interval:  423 QTC Calculation: 467 R Axis:   69 Text Interpretation: Sinus rhythm Atrial premature complex Minimal ST depression, inferior leads No STEMI Confirmed by Octaviano Glow [phone]) on 04/19/2020 8:41:29 PM   Radiology CT ABDOMEN PELVIS W CONTRAST  Result Date: 04/19/2020 CLINICAL DATA:  79 year old female with right upper quadrant abdominal pain and nausea. EXAM: CT ABDOMEN AND PELVIS WITH CONTRAST TECHNIQUE: Multidetector CT imaging of the abdomen and pelvis was performed using the standard protocol following bolus administration of intravenous contrast. CONTRAST:  11m OMNIPAQUE IOHEXOL 300 MG/ML  SOLN COMPARISON:  None. FINDINGS: Lower chest: The visualized lung bases are clear. Coronary vascular calcifications noted. There is no intra-abdominal free air or free fluid. Hepatobiliary: No focal liver  abnormality is seen.  No gallstones, gallbladder wall thickening, or biliary dilatation. Pancreas: Unremarkable. No pancreatic ductal dilatation or surrounding inflammatory changes. Spleen: Normal in size without focal abnormality. Adrenals/Urinary Tract: The adrenal glands are unremarkable. There is no hydronephrosis on either side. There is symmetric enhancement and excretion of contrast by both kidneys. The visualized ureters appear unremarkable. The urinary bladder is predominantly collapsed. Stomach/Bowel: There is no bowel obstruction or active inflammation. There is sigmoid diverticulosis without active inflammatory changes. The appendix is normal. Vascular/Lymphatic: Advanced aortoiliac atherosclerotic disease. The IVC is unremarkable. No portal venous gas. There is no adenopathy. Reproductive: Hysterectomy. No adnexal masses. Other: None Musculoskeletal: Severe osteopenia with multilevel old appearing compression fractures of the lumbar spine. No definite acute osseous pathology. IMPRESSION: 1. No acute intra-abdominal or pelvic pathology. 2. Sigmoid diverticulosis. 3. Aortic Atherosclerosis (ICD10-I70.0). Electronically Signed   By: Anner Crete M.D.   On: 04/19/2020 21:47    Procedures Procedures (including critical care time)  Medications Ordered in ED Medications  iohexol (OMNIPAQUE) 300 MG/ML solution 75 mL (75 mLs Intravenous Contrast Given 04/19/20 2125)    ED Course  I have reviewed the triage vital signs and the nursing notes.  Pertinent labs & imaging results that were available during my care of the patient were reviewed by me and considered in my medical decision making (see chart for details).  This is a pleasant 79 year old female presenting to emergency department with intermittent episodes of abdominal pain for many months.  She is concerned about biliary disease.  She does have some epigastric tenderness on exam with mild right upper quadrant tenderness, negative Murphy  sign.  There is no guarding or rebound tenderness.  It is possible this is biliary disease.  It is also possible this is a gastritis.  Also in the differential is the possibility of radiculopathy referred from the spine, as she has multiple chronic spinal fractures and significant kyphosis of the spine.  This latter point would be consistent with her report of persistent pain when standing, stating that the longer she stands on her feet, the worst her pain is.  Currently in the ED she has very minimal or no pain.  She has been taking Tylenol at home for pain.  She does not need pain medication at this time.  I personally reviewed her labs which showed a fairly unremarkable CMP.  No elevations of the LFTs or alk phos.  No leukocytosis.  Lipase is 42.  Troponin is 6 with a nonischemic EKG.  UA is negative.  Based on this work-up, I have a much lower suspicion for atypical ACS, cystitis, pyelonephritis, pancreatitis, cholecystitis, choledocholithiasis.  She has a benign abdominal exam.  I suspect this is likely referred pain that she is experiencing from her back.  I will have her follow-up with her PCP for this issue.  I did explain to them that ultrasound is more sensitive CT scan for gallbladder disease.  Although I see no signs or stigmata of biliary disease at this time to warrant hospitalization or surgical consult, if her doctor remains concerned for biliary colic, her doctor can obtain a right upper quadrant ultrasound as an outpatient.  I was able to answer all the patient's questions as well as her husband's.  Clinical Course as of Apr 19 2314  Sat Apr 19, 2020  2159 Musculoskeletal: Severe osteopenia with multilevel old appearing compression fractures of the lumbar spine. No definite acute osseous pathology.  IMPRESSION: 1. No acute intra-abdominal or pelvic pathology. 2. Sigmoid diverticulosis. 3. Aortic  Atherosclerosis (ICD10-I70.0).   [MT]  2301 I discussed CT findings with the  patient and her husband.  She is not having any active pain while sitting in the bed.  I do strongly suspect that her pain may be related to her chronic back fractures, given that they are particular worse when she is standing.  Advised that she follow-up with her PCP about this.  Call PCP Tuesday and bring discharge papers and report to the office.  I told her I have a lower suspicion for acute biliary disease.  I did explain that the sensitivity of CT scan is lower than ultrasound for biliary pathology, but based on her normal labs, I do not think she has a surgical emergency.  If her doctor remains concerned he can order an ultrasound.  She and her husband verbalized understanding.   [MT]  2302 Finally we talked about increasing her Tylenol dose.  She only takes a single 325 mg tablet once a day.  She can be on a higher dose for pain at home.   [MT]    Clinical Course User Index [MT] Audrey Eller, Carola Rhine, MD    Final Clinical Impression(s) / ED Diagnoses Final diagnoses:  Abdominal pain, unspecified abdominal location  Stress fracture of lumbar vertebra with routine healing, subsequent encounter    Rx / DC Orders ED Discharge Orders    None       Wyvonnia Dusky, MD 04/19/20 2316

## 2020-04-24 ENCOUNTER — Other Ambulatory Visit: Payer: Self-pay | Admitting: *Deleted

## 2020-04-24 MED ORDER — OMEPRAZOLE 40 MG PO CPDR: DELAYED_RELEASE_CAPSULE | ORAL | 1 refills | Status: DC

## 2020-05-05 ENCOUNTER — Encounter: Payer: Self-pay | Admitting: Family Medicine

## 2020-05-05 ENCOUNTER — Other Ambulatory Visit: Payer: Self-pay

## 2020-05-05 ENCOUNTER — Ambulatory Visit (HOSPITAL_COMMUNITY)
Admission: RE | Admit: 2020-05-05 | Discharge: 2020-05-05 | Disposition: A | Discharge status: Home / Self Care | Payer: Medicare HMO | Source: Ambulatory Visit | Attending: Family Medicine | Admitting: Family Medicine

## 2020-05-05 ENCOUNTER — Ambulatory Visit (INDEPENDENT_AMBULATORY_CARE_PROVIDER_SITE_OTHER): Payer: Medicare HMO | Admitting: Family Medicine

## 2020-05-05 VITALS — BP 127/81 | HR 76 | Temp 97.2°F | Ht 61.0 in | Wt 122.0 lb

## 2020-05-05 DIAGNOSIS — R1011 Right upper quadrant pain: Secondary | ICD-10-CM | POA: Diagnosis not present

## 2020-05-05 DIAGNOSIS — R1031 Right lower quadrant pain: Secondary | ICD-10-CM | POA: Diagnosis not present

## 2020-05-05 NOTE — Progress Notes (Signed)
BP 127/81   Pulse 76   Temp (!) 97.2 F (36.2 C)   Ht 5\' 1"  (1.549 m)   Wt 122 lb (55.3 kg)   SpO2 99%   BMI 23.05 kg/m    Subjective:   Patient ID: [name], female    DOB: [birth year], 79 y.o.   MRN: [id number]  HPI: [name] is a 79 y.o. female presenting on 05/05/2020 for Hospitalization Follow-up and Abdominal Pain (right sisded)   HPI Patient is coming in complaining of right upper quadrant abdominal pain that started 3 or 4 days ago, she went into the emergency department and they did a CT scan and did not find any issues.  They were concerned about her gallbladder but did not have an ultrasound over the weekend.  She says that the pain started about 3 or 4 days ago when she was up standing and helping make something and then the pain got bad enough that she had to sit down.  She does admit that she has been fighting some constipation off and on but she has been taking the MiraLAX and having 2 bowel movements per day and having to strain sometimes having more bowel movements.  She does not necessarily associate it with proving with bowel movements or worsening with eating food but it is worse in the upper part and right upper part of her abdomen  Relevant past medical, surgical, family and social history reviewed and updated as indicated. Interim medical history since our last visit reviewed. Allergies and medications reviewed and updated.  Review of Systems  Constitutional: Negative for chills and fever.  Eyes: Negative for visual disturbance.  Respiratory: Negative for chest tightness and shortness of breath.   Cardiovascular: Negative for chest pain and leg swelling.  Gastrointestinal: Positive for abdominal pain, nausea and vomiting. Negative for blood in stool, constipation and diarrhea.  Musculoskeletal: Negative for back pain and gait problem.  Skin: Negative for rash.  Neurological: Negative for light-headedness and headaches.  Psychiatric/Behavioral:  Negative for agitation and behavioral problems.  All other systems reviewed and are negative.   Per HPI unless specifically indicated above   Allergies as of 05/05/2020      Reactions   Bisphosphonates Other (See Comments)   Difficulty swallowing   Ciprofloxacin Nausea And Vomiting   Codeine Nausea And Vomiting   Sulfa Antibiotics Nausea And Vomiting      Medication List       Accurate as of May 05, 2020 10:46 AM. If you have any questions, ask your nurse or doctor.        acetaminophen 500 MG tablet Commonly known as: TYLENOL Take 500 mg by mouth every 8 (eight) hours as needed for mild pain or moderate pain.   atorvastatin 80 MG tablet Commonly known as: LIPITOR Take 1 tablet (80 mg total) by mouth daily.   Calcium-Phosphorus-Vitamin D 250-115-250 MG-MG-UNIT Chew Commonly known as: Citracal Calcium Gummies Chew 1 each by mouth 2 (two) times daily.   furosemide 20 MG tablet Commonly known as: LASIX Take 20 mg by mouth each day   hydrochlorothiazide 25 MG tablet Commonly known as: HYDRODIURIL TAKE 1 TABLET (25 MG TOTAL) BY MOUTH DAILY.   levothyroxine 100 MCG tablet Commonly known as: SYNTHROID Take 0.5 tablets (50 mcg total) by mouth every Monday, Tuesday, Wednesday, Thursday, and Friday.   metoprolol tartrate 25 MG tablet Commonly known as: LOPRESSOR Take 1 tablet (25 mg total) by mouth 2 (two) times daily as needed.  multivitamin with minerals Tabs tablet Take 2 tablets by mouth daily.   omeprazole 40 MG capsule Commonly known as: PRILOSEC TAKE ONE (1) CAPSULE EACH DAY   oxazepam 15 MG capsule Commonly known as: SERAX Take 1 capsule (15 mg total) by mouth 3 (three) times daily as needed for sleep.   polyethylene glycol 17 g packet Commonly known as: MIRALAX / GLYCOLAX Take 17 g by mouth daily as needed for moderate constipation.   potassium chloride 20 MEQ/15ML (10%) Soln TAKE 7.5ML (1&1/2 TEASPOONFUL) BY MOUTH DAILY AS NEEDED FOR CRAMPING    sucralfate 1 g tablet Commonly known as: CARAFATE Take 1 tablet (1 g total) by mouth 2 (two) times daily.   triamcinolone ointment 0.5 % Commonly known as: KENALOG Apply 1 application topically 2 (two) times daily.   Vitamin D3 50 MCG (2000 UT) Chew Chew 4,000 Units by mouth daily.        Objective:   BP 127/81   Pulse 76   Temp (!) 97.2 F (36.2 C)   Ht 5\' 1"  (1.549 m)   Wt 122 lb (55.3 kg)   SpO2 99%   BMI 23.05 kg/m   Wt Readings from Last 3 Encounters:  05/05/20 122 lb (55.3 kg)  04/19/20 124 lb (56.2 kg)  02/25/20 124 lb 8 oz (56.5 kg)    Physical Exam Vitals and nursing note reviewed.  Constitutional:      General: She is not in acute distress.    Appearance: She is well-developed. She is not diaphoretic.  Eyes:     Conjunctiva/sclera: Conjunctivae normal.  Abdominal:     General: Abdomen is flat. Bowel sounds are normal.     Tenderness: There is abdominal tenderness in the right upper quadrant and epigastric area. There is no right CVA tenderness, left CVA tenderness, guarding or rebound.  Skin:    General: Skin is warm and dry.     Findings: No rash.  Neurological:     Mental Status: She is alert and oriented to person, place, and time.     Coordination: Coordination normal.  Psychiatric:        Behavior: Behavior normal.       Assessment & Plan:   Problem List Items Addressed This Visit    None    Visit Diagnoses    Right upper quadrant abdominal pain    -  Primary   Relevant Orders   US ABDOMEN LIMITED RUQ      Likely GERD related versus gallbladder, will order ultrasound, if normal then may consider doing antacid. Follow up plan: Return if symptoms worsen or fail to improve.  Counseling provided for all of the vaccine components Orders Placed This Encounter  Procedures  . US ABDOMEN LIMITED RUQ    Jefferie Holston, MD [facility] 05/05/2020, 10:46 AM

## 2020-05-21 ENCOUNTER — Other Ambulatory Visit: Payer: Self-pay | Admitting: Family Medicine

## 2020-05-22 NOTE — Telephone Encounter (Signed)
Last OV 02/25/20. Last RF 02/25/20. Next OV not scheduled

## 2020-05-23 MED ORDER — OXAZEPAM 15 MG PO CAPS: 15.0000 mg | ORAL_CAPSULE | Freq: Three times a day (TID) | ORAL | 0 refills | Status: DC | PRN

## 2020-05-23 NOTE — Telephone Encounter (Signed)
I sent 1 months worth but she really needs to make sure she makes an appointment and checks out upfront with them. Caryl Pina, MD [facility] 05/23/2020, 12:50 PM

## 2020-05-23 NOTE — Telephone Encounter (Signed)
Spoke to pt and scheduled her follow up with you 06/18/20. Pt was confused and thought she had appt with you 05/27/20 because the [facility] had to follow up in 3 months around 05/27/20 so she thought that was her appt. Can you send over enough to get her through to her follow up appt?

## 2020-05-23 NOTE — Addendum Note (Signed)
Addended by: Caryl Pina on: 05/23/2020 12:50 PM   Modules accepted: Orders

## 2020-06-02 ENCOUNTER — Other Ambulatory Visit: Payer: Self-pay | Admitting: Family Medicine

## 2020-06-18 ENCOUNTER — Encounter: Payer: Self-pay | Admitting: Family Medicine

## 2020-06-18 ENCOUNTER — Other Ambulatory Visit: Payer: Self-pay

## 2020-06-18 ENCOUNTER — Ambulatory Visit (INDEPENDENT_AMBULATORY_CARE_PROVIDER_SITE_OTHER): Payer: Medicare HMO | Admitting: Family Medicine

## 2020-06-18 VITALS — BP 132/79 | HR 105 | Temp 98.0°F | Ht 61.0 in | Wt 120.0 lb

## 2020-06-18 DIAGNOSIS — Z1231 Encounter for screening mammogram for malignant neoplasm of breast: Secondary | ICD-10-CM

## 2020-06-18 DIAGNOSIS — R7303 Prediabetes: Secondary | ICD-10-CM

## 2020-06-18 DIAGNOSIS — E034 Atrophy of thyroid (acquired): Secondary | ICD-10-CM

## 2020-06-18 DIAGNOSIS — N1832 Chronic kidney disease, stage 3b: Secondary | ICD-10-CM | POA: Diagnosis not present

## 2020-06-18 DIAGNOSIS — E782 Mixed hyperlipidemia: Secondary | ICD-10-CM | POA: Diagnosis not present

## 2020-06-18 DIAGNOSIS — I10 Essential (primary) hypertension: Secondary | ICD-10-CM | POA: Diagnosis not present

## 2020-06-18 LAB — BAYER DCA HB A1C WAIVED: HB A1C (BAYER DCA - WAIVED): 6.1 % (ref <7.0)

## 2020-06-18 MED ORDER — OXAZEPAM 15 MG PO CAPS: 15.0000 mg | ORAL_CAPSULE | Freq: Three times a day (TID) | ORAL | 2 refills | Status: DC | PRN

## 2020-06-18 NOTE — Progress Notes (Signed)
BP 132/79   Pulse (!) 105   Temp 98 F (36.7 C)   Ht _0  (1.549 m)   Wt 120 lb (54.4 kg)   SpO2 96%   BMI 22.67 kg/m    Subjective:   Patient ID: [name], female    DOB: 12-31-1940, 79 y.o.   MRN: [id number]  HPI: [name] is a 79 y.o. female presenting on 06/18/2020 for No chief complaint on file.   HPI Anxiety recheck Current rx-oxazepam 15 mg 3 times daily as needed # meds rx-90 Effectiveness of current meds-works well, patient does not have any falls or side effects Adverse reactions form meds-none  Pill count performed-No Last drug screen -02-25-2020 ( high risk q46m moderate risk q625mlow risk yearly ) Urine drug screen today- No [place]  If yes were their any concerning findings? -None  No flowsheet data found.   Controlled substance contract signed on: 09-05-2019  Prediabetes Patient comes in today for recheck of his diabetes. Patient has been currently taking no medication and has been diet controlled previous, will check A1c again. Patient is not currently on an ACE inhibitor/ARB. Patient has seen an ophthalmologist this year. Patient denies any issues with their feet. The symptom started onset as an adult hypertension hyperlipidemia hypothyroidism ARE RELATED TO DM   Hyperlipidemia Patient is coming in for recheck of his hyperlipidemia. The patient is currently taking atorvastatin. They deny any issues with myalgias or history of liver damage from it. They deny any focal numbness or weakness or chest pain.   Hypertension Patient is currently on hydrochlorothiazide and furosemide and metoprolol, and their blood pressure today is 132/79. Patient denies any lightheadedness or dizziness. Patient denies headaches, blurred vision, chest pains, shortness of breath, or weakness. Denies any side effects from medication and is content with current medication.   Hypothyroidism recheck Patient is coming in for thyroid recheck today as  well. They deny any issues with hair changes or heat or cold problems or diarrhea or constipation. They deny any chest pain or palpitations. They are currently on levothyroxine 100 micrograms   Relevant past medical, surgical, family and social history reviewed and updated as indicated. Interim medical history since our last visit reviewed. Allergies and medications reviewed and updated.  Review of Systems  Constitutional: Negative for chills and fever.  Eyes: Negative for visual disturbance.  Respiratory: Negative for chest tightness and shortness of breath.   Cardiovascular: Negative for chest pain and leg swelling.  Genitourinary: Negative for difficulty urinating and dysuria.  Musculoskeletal: Negative for back pain and gait problem.  Skin: Negative for rash.  Neurological: Negative for light-headedness and headaches.  Psychiatric/Behavioral: Negative for agitation and behavioral problems.  All other systems reviewed and are negative.   Per HPI unless specifically indicated above   Allergies as of 06/18/2020      Reactions   Bisphosphonates Other (See Comments)   Difficulty swallowing   Ciprofloxacin Nausea And Vomiting   Codeine Nausea And Vomiting   Sulfa Antibiotics Nausea And Vomiting      Medication List       Accurate as of June 18, 2020  8:19 AM. If you have any questions, ask your nurse or doctor.        acetaminophen 500 MG tablet Commonly known as: TYLENOL Take 500 mg by mouth every 8 (eight) hours as needed for mild pain or moderate pain.   atorvastatin 80 MG tablet Commonly known as: LIPITOR Take 1  tablet (80 mg total) by mouth daily.   Calcium-Phosphorus-Vitamin D 250-115-250 MG-MG-UNIT Chew Commonly known as: Citracal Calcium Gummies Chew 1 each by mouth 2 (two) times daily.   furosemide 20 MG tablet Commonly known as: LASIX TAKE 1 TABLET EVERY DAY   hydrochlorothiazide 25 MG tablet Commonly known as: HYDRODIURIL TAKE 1 TABLET (25 MG TOTAL)  BY MOUTH DAILY.   levothyroxine 100 MCG tablet Commonly known as: SYNTHROID Take 0.5 tablets (50 mcg total) by mouth every Monday, Tuesday, Wednesday, Thursday, and Friday.   metoprolol tartrate 25 MG tablet Commonly known as: LOPRESSOR Take 1 tablet (25 mg total) by mouth 2 (two) times daily as needed.   multivitamin with minerals Tabs tablet Take 2 tablets by mouth daily.   omeprazole 40 MG capsule Commonly known as: PRILOSEC TAKE ONE (1) CAPSULE EACH DAY   oxazepam 15 MG capsule Commonly known as: SERAX Take 1 capsule (15 mg total) by mouth 3 (three) times daily as needed for sleep.   polyethylene glycol 17 g packet Commonly known as: MIRALAX / GLYCOLAX Take 17 g by mouth daily as needed for moderate constipation.   potassium chloride 20 MEQ/15ML (10%) Soln TAKE 7.5ML (1&1/2 TEASPOONFUL) BY MOUTH DAILY AS NEEDED FOR CRAMPING   sucralfate 1 g tablet Commonly known as: CARAFATE TAKE 1 TABLET TWICE DAILY   triamcinolone ointment 0.5 % Commonly known as: KENALOG Apply 1 application topically 2 (two) times daily.   Vitamin D3 50 MCG (2000 UT) Chew Chew 4,000 Units by mouth daily.        Objective:   BP 132/79   Pulse (!) 105   Temp 98 F (36.7 C)   Ht _0  (1.549 m)   Wt 120 lb (54.4 kg)   SpO2 96%   BMI 22.67 kg/m   Wt Readings from Last 3 Encounters:  06/18/20 120 lb (54.4 kg)  05/05/20 122 lb (55.3 kg)  04/19/20 124 lb (56.2 kg)    Physical Exam Vitals and nursing note reviewed.  Constitutional:      General: She is not in acute distress.    Appearance: She is well-developed. She is not diaphoretic.  Eyes:     Conjunctiva/sclera: Conjunctivae normal.  Cardiovascular:     Rate and Rhythm: Normal rate and regular rhythm.     Heart sounds: Normal heart sounds. No murmur heard.   Pulmonary:     Effort: Pulmonary effort is normal. No respiratory distress.     Breath sounds: Normal breath sounds. No wheezing.  Musculoskeletal:        General: No  tenderness. Normal range of motion.  Skin:    General: Skin is warm and dry.     Findings: No rash.  Neurological:     Mental Status: She is alert and oriented to person, place, and time.     Coordination: Coordination normal.  Psychiatric:        Behavior: Behavior normal.       Assessment & Plan:   Problem List Items Addressed This Visit      Cardiovascular and Mediastinum   Essential hypertension     Endocrine   Hypothyroid     Genitourinary   Chronic kidney disease (CKD) stage G3b/A1, moderately decreased glomerular filtration rate (GFR) between 30-44 mL/min/1.73 square meter and albuminuria creatinine ratio less than 30 mg/g     Other   Hyperlipemia   Relevant Orders   Lipid panel   Prediabetes - Primary   Relevant Orders   CMP14+EGFR  Bayer DCA Hb A1c Waived    Other Visit Diagnoses    Screening mammogram, encounter for       Relevant Orders   MM 3D SCREEN BREAST BILATERAL      Will check blood work today, continue oxazepam, patient has been very resistant towards trying any other antianxiety medications, reiterated the fact that it needs to be during a visit. Follow up plan: Return in about 3 months (around 09/17/2020) for Anxiety and thyroid recheck.  Counseling provided for all of the vaccine components No orders of the defined types were placed in this encounter.   Caryl Pina, MD [facility] 06/18/2020, 8:19 AM

## 2020-06-19 LAB — CMP14+EGFR
ALT: 12 IU/L (ref 0–32)
AST: 20 IU/L (ref 0–40)
Albumin/Globulin Ratio: 1.6 (ref 1.2–2.2)
Albumin: 4.1 g/dL (ref 3.6–4.6)
Alkaline Phosphatase: 101 IU/L (ref 48–121)
BUN/Creatinine Ratio: 13 (ref 12–28)
BUN: 16 mg/dL (ref 8–27)
Bilirubin Total: 0.5 mg/dL (ref 0.0–1.2)
CO2: 27 mmol/L (ref 20–29)
Calcium: 9.3 mg/dL (ref 8.7–10.3)
Chloride: 98 mmol/L (ref 96–106)
Creatinine, Ser: 1.27 mg/dL — ABNORMAL HIGH (ref 0.57–1.00)
GFR calc Af Amer: 46 mL/min/{1.73_m2} — ABNORMAL LOW (ref >59)
GFR calc non Af Amer: 40 mL/min/{1.73_m2} — ABNORMAL LOW (ref >59)
Globulin, Total: 2.6 g/dL (ref 1.5–4.5)
Glucose: 86 mg/dL (ref 65–99)
Potassium: 3.6 mmol/L (ref 3.5–5.2)
Sodium: 141 mmol/L (ref 134–144)
Total Protein: 6.7 g/dL (ref 6.0–8.5)

## 2020-06-19 LAB — LIPID PANEL
Chol/HDL Ratio: 3.2 ratio (ref 0.0–4.4)
Cholesterol, Total: 134 mg/dL (ref 100–199)
HDL: 42 mg/dL (ref >39)
LDL Chol Calc (NIH): 70 mg/dL (ref 0–99)
Triglycerides: 121 mg/dL (ref 0–149)
VLDL Cholesterol Cal: 22 mg/dL (ref 5–40)

## 2020-07-17 ENCOUNTER — Emergency Department (HOSPITAL_COMMUNITY): Payer: Medicare HMO

## 2020-07-17 ENCOUNTER — Other Ambulatory Visit: Payer: Self-pay

## 2020-07-17 ENCOUNTER — Observation Stay (HOSPITAL_COMMUNITY)
Admission: EM | Admit: 2020-07-17 | Discharge: 2020-07-17 | Disposition: A | Discharge status: Home / Self Care | Payer: Medicare HMO | Attending: Family Medicine | Admitting: Family Medicine

## 2020-07-17 DIAGNOSIS — S41112A Laceration without foreign body of left upper arm, initial encounter: Secondary | ICD-10-CM | POA: Diagnosis not present

## 2020-07-17 DIAGNOSIS — Z87891 Personal history of nicotine dependence: Secondary | ICD-10-CM | POA: Insufficient documentation

## 2020-07-17 DIAGNOSIS — I10 Essential (primary) hypertension: Secondary | ICD-10-CM | POA: Diagnosis not present

## 2020-07-17 DIAGNOSIS — Z79899 Other long term (current) drug therapy: Secondary | ICD-10-CM | POA: Insufficient documentation

## 2020-07-17 DIAGNOSIS — E039 Hypothyroidism, unspecified: Secondary | ICD-10-CM | POA: Diagnosis not present

## 2020-07-17 DIAGNOSIS — I129 Hypertensive chronic kidney disease with stage 1 through stage 4 chronic kidney disease, or unspecified chronic kidney disease: Secondary | ICD-10-CM | POA: Diagnosis not present

## 2020-07-17 DIAGNOSIS — S52591A Other fractures of lower end of right radius, initial encounter for closed fracture: Secondary | ICD-10-CM | POA: Diagnosis not present

## 2020-07-17 DIAGNOSIS — I6529 Occlusion and stenosis of unspecified carotid artery: Secondary | ICD-10-CM | POA: Diagnosis not present

## 2020-07-17 DIAGNOSIS — N1832 Chronic kidney disease, stage 3b: Secondary | ICD-10-CM | POA: Diagnosis not present

## 2020-07-17 DIAGNOSIS — S52501A Unspecified fracture of the lower end of right radius, initial encounter for closed fracture: Secondary | ICD-10-CM | POA: Diagnosis not present

## 2020-07-17 DIAGNOSIS — M25561 Pain in right knee: Secondary | ICD-10-CM | POA: Diagnosis not present

## 2020-07-17 DIAGNOSIS — W01198A Fall on same level from slipping, tripping and stumbling with subsequent striking against other object, initial encounter: Secondary | ICD-10-CM | POA: Insufficient documentation

## 2020-07-17 DIAGNOSIS — S82101A Unspecified fracture of upper end of right tibia, initial encounter for closed fracture: Secondary | ICD-10-CM | POA: Diagnosis not present

## 2020-07-17 DIAGNOSIS — S0181XA Laceration without foreign body of other part of head, initial encounter: Secondary | ICD-10-CM | POA: Insufficient documentation

## 2020-07-17 DIAGNOSIS — S81011A Laceration without foreign body, right knee, initial encounter: Secondary | ICD-10-CM

## 2020-07-17 DIAGNOSIS — S0990XA Unspecified injury of head, initial encounter: Secondary | ICD-10-CM | POA: Diagnosis not present

## 2020-07-17 DIAGNOSIS — Z85828 Personal history of other malignant neoplasm of skin: Secondary | ICD-10-CM | POA: Diagnosis not present

## 2020-07-17 DIAGNOSIS — S82141A Displaced bicondylar fracture of right tibia, initial encounter for closed fracture: Secondary | ICD-10-CM | POA: Diagnosis present

## 2020-07-17 DIAGNOSIS — W19XXXA Unspecified fall, initial encounter: Secondary | ICD-10-CM

## 2020-07-17 DIAGNOSIS — M1711 Unilateral primary osteoarthritis, right knee: Secondary | ICD-10-CM | POA: Diagnosis not present

## 2020-07-17 DIAGNOSIS — S8991XA Unspecified injury of right lower leg, initial encounter: Secondary | ICD-10-CM | POA: Diagnosis not present

## 2020-07-17 DIAGNOSIS — G319 Degenerative disease of nervous system, unspecified: Secondary | ICD-10-CM | POA: Diagnosis not present

## 2020-07-17 DIAGNOSIS — M25531 Pain in right wrist: Secondary | ICD-10-CM | POA: Diagnosis not present

## 2020-07-17 LAB — BASIC METABOLIC PANEL
Anion gap: 11 (ref 5–15)
BUN: 22 mg/dL (ref 8–23)
CO2: 26 mmol/L (ref 22–32)
Calcium: 8.8 mg/dL — ABNORMAL LOW (ref 8.9–10.3)
Chloride: 99 mmol/L (ref 98–111)
Creatinine, Ser: 1.2 mg/dL — ABNORMAL HIGH (ref 0.44–1.00)
GFR calc Af Amer: 49 mL/min — ABNORMAL LOW (ref >60)
GFR calc non Af Amer: 42 mL/min — ABNORMAL LOW (ref >60)
Glucose, Bld: 126 mg/dL — ABNORMAL HIGH (ref 70–99)
Potassium: 3.6 mmol/L (ref 3.5–5.1)
Sodium: 136 mmol/L (ref 135–145)

## 2020-07-17 LAB — CBC WITH DIFFERENTIAL/PLATELET
Abs Immature Granulocytes: 0.03 10*3/uL (ref 0.00–0.07)
Basophils Absolute: 0.1 10*3/uL (ref 0.0–0.1)
Basophils Relative: 1 %
Eosinophils Absolute: 0.2 10*3/uL (ref 0.0–0.5)
Eosinophils Relative: 2 %
HCT: 37.8 % (ref 36.0–46.0)
Hemoglobin: 12.3 g/dL (ref 12.0–15.0)
Immature Granulocytes: 0 %
Lymphocytes Relative: 13 %
Lymphs Abs: 1.3 10*3/uL (ref 0.7–4.0)
MCH: 30 pg (ref 26.0–34.0)
MCHC: 32.5 g/dL (ref 30.0–36.0)
MCV: 92.2 fL (ref 80.0–100.0)
Monocytes Absolute: 0.7 10*3/uL (ref 0.1–1.0)
Monocytes Relative: 7 %
Neutro Abs: 7.4 10*3/uL (ref 1.7–7.7)
Neutrophils Relative %: 77 %
Platelets: 250 10*3/uL (ref 150–400)
RBC: 4.1 MIL/uL (ref 3.87–5.11)
RDW: 13.2 % (ref 11.5–15.5)
WBC: 9.6 10*3/uL (ref 4.0–10.5)
nRBC: 0 % (ref 0.0–0.2)

## 2020-07-17 MED ORDER — WHEELCHAIR MISC: 0 refills | Status: AC

## 2020-07-17 MED ORDER — HYDROCODONE-ACETAMINOPHEN 5-325 MG PO TABS
1.0000 | ORAL_TABLET | ORAL | 0 refills | Status: DC | PRN
Start: 2020-07-17 — End: 2020-07-29

## 2020-07-17 MED ORDER — COMMODE BEDSIDE MISC: 0 refills | Status: AC

## 2020-07-17 NOTE — Discharge Instructions (Signed)
Schedule to see the Orthopaedist for evaluation  

## 2020-07-17 NOTE — ED Provider Notes (Signed)
[facility] Provider Note   CSN: [id number] Arrival date & time: 07/17/20  1608     History Chief Complaint  Patient presents with  . Fall    [name] is a 79 y.o. female.  The history is provided by the patient.  Fall This is a new problem. The current episode started 1 to 2 hours ago. The problem occurs constantly. The problem has not changed since onset.Nothing aggravates the symptoms. Nothing relieves the symptoms. She has tried nothing for the symptoms.   Pt reports her tennis shoe stuck and made her fall.  Pt complains of a laceration to her right knee, pain in her right wrist, laceration to left upper farm and a cut to her forehead.  Pt reports no loss of concourses.     Past Medical History:  Diagnosis Date  . Anxiety states   . Breast cyst   . Cancer ([place])    skin  . Cataract   . Constipation   . Esophageal stricture   . Essential hypertension 12/18/2015  . Fracture, rib   . GERD (gastroesophageal reflux disease)   . Hiatal hernia   . Lower extremity edema 12/18/2015  . Osteoporosis   . Other and unspecified hyperlipidemia   . PAC (premature atrial contraction) 12/30/2015  . Palpitations 12/18/2015  . Unspecified hypothyroidism   . Vertebral fracture, osteoporotic [facility])     Patient Active Problem List   Diagnosis Date Noted  . Tibial plateau fracture, right, closed, initial encounter 07/17/2020  . Prediabetes 02/25/2020  . Chronic kidney disease (CKD) stage G3b/A1, moderately decreased glomerular filtration rate (GFR) between 30-44 mL/min/1.73 square meter and albuminuria creatinine ratio less than 30 mg/g 02/22/2018  . Atherosclerosis of aorta ([place]) 02/21/2018  . PAC (premature atrial contraction) 12/30/2015  . Essential hypertension 12/18/2015  . Lower extremity edema 12/18/2015  . Palpitations 12/18/2015  . Hyperlipemia 02/14/2013  . Hypothyroid 02/14/2013  . Vitamin D deficiency 02/14/2013  . Generalized anxiety disorder  02/14/2013  . Constipation   . Esophageal stricture   . Osteoporosis with pathological fracture   . GERD (gastroesophageal reflux disease)     Past Surgical History:  Procedure Laterality Date  . ABDOMINAL HYSTERECTOMY    . APPENDECTOMY    . BREAST BIOPSY     bil cysts  . CATARACT EXTRACTION    . ESOPHAGOGASTRODUODENOSCOPY N/A 01/19/2018   Procedure: ESOPHAGOGASTRODUODENOSCOPY (EGD);  Surgeon: Clarene Essex, MD;  Location: [facility];  Service: Endoscopy;  Laterality: N/A;  with flouro  . EYE SURGERY    . HAND SURGERY Left   . IR GENERIC HISTORICAL  12/10/2016   IR RADIOLOGIST EVAL & MGMT 12/10/2016 [facility]  . IR GENERIC HISTORICAL  12/23/2016   IR KYPHO THORACIC WITH BONE BIOPSY 12/23/2016 Luanne Bras, MD [facility]  . PARTIAL HYSTERECTOMY    . THYROID LOBECTOMY     right  . VERTEBROPLASTY       OB History   No obstetric history on file.     Family History  Problem Relation Age of Onset  . Goiter Father   . Alcohol abuse Father   . Heart disease Sister   . Cancer Sister        breast and skin  . Goiter Brother   . Dementia Brother   . Hyperlipidemia Brother   . Hernia Brother   . Cancer Brother        throat  . Cancer Brother  neck  . Heart disease Brother   . Early death Brother        auto accident    Social History   Tobacco Use  . Smoking status: Former Smoker    Types: Cigarettes    Quit date: 11/18/2005    Years since quitting: 14.6  . Smokeless tobacco: Never Used  . Tobacco comment: Non-smoker  quit 12-13 years  Vaping Use  . Vaping Use: Never used  Substance Use Topics  . Alcohol use: No  . Drug use: No    Home Medications Prior to Admission medications   Medication Sig Start Date End Date Taking? Authorizing Provider  acetaminophen (TYLENOL) 500 MG tablet Take 500 mg by mouth every 8 (eight) hours as needed for mild pain or moderate pain.    [provider]  atorvastatin (LIPITOR) 80 MG tablet Take 1 tablet (80 mg  total) by mouth daily. 02/25/20   Dettinger, Fransisca Kaufmann, MD  Calcium-Phosphorus-Vitamin D ([place]) [phone] MG-MG-UNIT CHEW Chew 1 each by mouth 2 (two) times daily. 02/16/17   Cherre Robins, PharmD  Cholecalciferol (VITAMIN D3) 2000 units CHEW Chew 4,000 Units by mouth daily.     [provider]  furosemide (LASIX) 20 MG tablet TAKE 1 TABLET EVERY DAY 06/02/20   Dettinger, Fransisca Kaufmann, MD  hydrochlorothiazide (HYDRODIURIL) 25 MG tablet TAKE 1 TABLET (25 MG TOTAL) BY MOUTH DAILY. 03/25/20   Dettinger, Fransisca Kaufmann, MD  HYDROcodone-acetaminophen (NORCO/VICODIN) 5-325 MG tablet Take 1 tablet by mouth every 4 (four) hours as needed for moderate pain. 07/17/20 07/17/21  Fransico Meadow, PA-C  levothyroxine (SYNTHROID) 100 MCG tablet Take 0.5 tablets (50 mcg total) by mouth every Monday, Tuesday, Wednesday, Thursday, and Friday. 11/06/19   Dettinger, Fransisca Kaufmann, MD  metoprolol tartrate (LOPRESSOR) 25 MG tablet Take 1 tablet (25 mg total) by mouth 2 (two) times daily as needed. 02/22/18   Minus Breeding, MD  Multiple Vitamin (MULTIVITAMIN WITH MINERALS) TABS tablet Take 2 tablets by mouth daily.     [provider]  omeprazole (PRILOSEC) 40 MG capsule TAKE ONE (1) CAPSULE EACH DAY 04/24/20   Dettinger, Fransisca Kaufmann, MD  oxazepam (SERAX) 15 MG capsule Take 1 capsule (15 mg total) by mouth 3 (three) times daily as needed for sleep. 06/18/20   Dettinger, Fransisca Kaufmann, MD  polyethylene glycol (MIRALAX / GLYCOLAX) packet Take 17 g by mouth daily as needed for moderate constipation.     [provider]  potassium chloride 20 MEQ/15ML (10%) SOLN TAKE 7.5ML (1&1/2 TEASPOONFUL) BY MOUTH DAILY AS NEEDED FOR CRAMPING 04/03/20   Dettinger, Fransisca Kaufmann, MD  sucralfate (CARAFATE) 1 g tablet TAKE 1 TABLET TWICE DAILY 06/02/20   Dettinger, Fransisca Kaufmann, MD  triamcinolone ointment (KENALOG) 0.5 % Apply 1 application topically 2 (two) times daily. 12/05/19   Dettinger, Fransisca Kaufmann, MD    Allergies    Bisphosphonates,  Ciprofloxacin, Codeine, and Sulfa antibiotics  Review of Systems   Review of Systems  All other systems reviewed and are negative.   Physical Exam Updated Vital Signs BP (!) 145/73 (BP Location: Right Arm)   Pulse 95   Temp 97.7 F (36.5 C) (Oral)   Resp 18   Ht 5\' 1"  (1.549 m)   Wt 55.3 kg   SpO2 99%   BMI 23.05 kg/m   Physical Exam Vitals and nursing note reviewed.  Constitutional:      Appearance: She is well-developed.  HENT:     Head: Normocephalic.  Pulmonary:  Effort: Pulmonary effort is normal.  Abdominal:     General: There is no distension.  Musculoskeletal:        General: Normal range of motion.     Cervical back: Normal range of motion.  Neurological:     Mental Status: She is alert and oriented to person, place, and time.     ED Results / Procedures / Treatments   Labs (all labs ordered are listed, but only abnormal results are displayed) Labs Reviewed  BASIC METABOLIC PANEL - Abnormal; Notable for the following components:      Result Value   Glucose, Bld 126 (*)    Creatinine, Ser 1.20 (*)    Calcium 8.8 (*)    GFR calc non Af Amer 42 (*)    GFR calc Af Amer 49 (*)    All other components within normal limits  CBC WITH DIFFERENTIAL/PLATELET    EKG EKG Interpretation  Date/Time:  Thursday July 17 2020 17:16:31 EDT Ventricular Rate:  93 PR Interval:  152 QRS Duration: 124 QT Interval:  392 QTC Calculation: 488 R Axis:   109 Text Interpretation: Sinus rhythm RBBB and LPFB Since last tracing of earlier today No significant change was found Confirmed by Daleen Bo [phone]) on 07/17/2020 5:23:39 PM   Radiology DG Wrist Complete Right  Result Date: 07/17/2020 CLINICAL DATA:  Right wrist pain after fall today. EXAM: RIGHT WRIST - COMPLETE 3+ VIEW COMPARISON:  None. FINDINGS: Nondisplaced ulnar styloid fracture is noted. Mildly displaced distal radial fracture is noted. Soft tissues are unremarkable. IMPRESSION: Mildly displaced  distal radial fracture. Nondisplaced ulnar styloid fracture. Electronically Signed   By: Marijo Conception M.D.   On: 07/17/2020 17:22   CT Head Wo Contrast  Result Date: 07/17/2020 CLINICAL DATA:  Head trauma fall laceration EXAM: CT HEAD WITHOUT CONTRAST TECHNIQUE: Contiguous axial images were obtained from the base of the skull through the vertex without intravenous contrast. COMPARISON:  Report 10/27/2009 FINDINGS: Brain: No acute territorial infarction, hemorrhage or intracranial mass. Mild atrophy. Mild hypodensity in the white matter consistent with chronic small vessel ischemic change. Nonenlarged ventricles Vascular: No hyperdense vessels. Scattered carotid vascular calcification Skull: No fracture. Areas of parietal bone thinning near the vertex. Sinuses/Orbits: No acute finding. Other: None IMPRESSION: 1. No CT evidence for acute intracranial abnormality. 2. Atrophy and mild chronic small vessel ischemic changes of the white matter. Electronically Signed   By: Donavan Foil M.D.   On: 07/17/2020 18:26   CT Knee Right Wo Contrast  Result Date: 07/17/2020 CLINICAL DATA:  Trip and fall on sidewalk today with right knee pain. Suspected lipohemarthrosis on radiograph. EXAM: CT OF THE RIGHT KNEE WITHOUT CONTRAST TECHNIQUE: Multidetector CT imaging of the RIGHT knee was performed according to the standard protocol. Multiplanar CT image reconstructions were also generated. COMPARISON:  Radiograph earlier today. FINDINGS: Bones/Joint/Cartilage There is a lipohemarthrosis indicative of intra-articular fracture. There is minimal impaction injury/cortical irregularity involving the anterior most lateral tibial plateau, series 4, image 120 and series 5, image 65. No significant articular depression. There is no metaphyseal component. No involvement of the tibial spines and medial tibial plateau. Mild tricompartmental osteoarthritis with spurring. Medial and lateral tibiofemoral joint space narrowing. Medial and  lateral chondrocalcinosis. Bones are diffusely under mineralized. Ligaments Suboptimally assessed by CT. Muscles and Tendons Quadriceps and patellar tendons are intact. No confluent intramuscular hematoma. Soft tissues Soft tissue air in the anterior knee typical with laceration. There is no radiopaque foreign body in the soft tissues.  IMPRESSION: 1. Minimal impaction fracture/cortical irregularity involving the anterior most lateral tibial plateau without significant articular depression. No metaphyseal component. Lipohemarthrosis. 2. Mild tricompartmental osteoarthritis with chondrocalcinosis. 3. Soft tissue air in the anterior knee typical of laceration. No radiopaque foreign body in the soft tissues. Electronically Signed   By: Keith Rake M.D.   On: 07/17/2020 18:32   DG Knee Complete 4 Views Right  Result Date: 07/17/2020 CLINICAL DATA:  Right knee pain after fall today. EXAM: RIGHT KNEE - COMPLETE 4+ VIEW COMPARISON:  None. FINDINGS: Mild narrowing of the medial and lateral joint spaces are noted. There appears to be a fat fluid level within the suprapatellar bursa and anterior joint space suggesting occult fracture. Mild patellar spurring is noted. IMPRESSION: Probable fat fluid level seen in the suprapatellar bursa and anterior joint space suggesting occult fracture. CT scan is recommended for further evaluation. Electronically Signed   By: Marijo Conception M.D.   On: 07/17/2020 17:25    Procedures Procedures (including critical care time)  Medications Ordered in ED Medications - No data to display  ED Course  I have reviewed the triage vital signs and the nursing notes.  Pertinent labs & imaging results that were available during my care of the patient were reviewed by me and considered in my medical decision making (see chart for details).    MDM Rules/Calculators/A&P                          MDM:  I spoke to Dr. Stann Mainland. He advised pt does not need surgical care.  I spoke with  hospitalist about admitting pt.  PT decided that she could walk.  Pt ambulated to bathroom with RN.  Pt advised to call Dr. Stann Mainland to schedule follow up  Final Clinical Impression(s) / ED Diagnoses Final diagnoses:  Fall, initial encounter  Facial laceration, initial encounter  Laceration of left upper extremity with complication, initial encounter  Laceration of right knee, initial encounter  Closed fracture of proximal end of right tibia, unspecified fracture morphology, initial encounter  Other closed fracture of distal end of right radius, initial encounter    Rx / DC Orders ED Discharge Orders         Ordered    HYDROcodone-acetaminophen (NORCO/VICODIN) 5-325 MG tablet  Every 4 hours PRN        07/17/20 1846        An After Visit Summary was printed and given to the patient.    Fransico Meadow, [place] 07/18/20 9211    Daleen Bo, MD 07/20/20 [phone]

## 2020-07-17 NOTE — ED Triage Notes (Signed)
Pt. [place] [facility]. Pt. Has a 2 in. Laceration on the left side of the forehead. Pt. Also has a skin tear on the left arm going from the arm pit down to the shoulder. Pt also has a skin tear on right knee. Pt. Also state their right arm hurts. Pt. Braced their fall with the right arm. Husband was present during fall. Pt. Denies being on blood thinners and denies passing out.

## 2020-07-17 NOTE — ED Provider Notes (Signed)
  Face-to-face evaluation   History: She presents for evaluation of fall with injury to head, and left arm she had apparently been shopping during this episode.  Physical exam: Elderly alert cooperative.  Multiple skin injuries, left forehead, left volar forearm, right anterior knee.  Nomal range of motion arms and left leg.  Decreased flexion right knee secondary to pain.  Medical screening examination/treatment/procedure(s) were conducted as a shared visit with non-physician practitioner(s) and myself.  I personally evaluated the patient during the encounter    Daleen Bo, MD 07/20/20 [phone]

## 2020-07-18 ENCOUNTER — Telehealth: Payer: Self-pay

## 2020-07-18 NOTE — Telephone Encounter (Signed)
Patient called to complain that we would not see her yesterday 9/30 for a fall.  Patient's husband came in to say his wife fell at [facility] and needed to be seen.  Informed patient's husband we did not have any appointments available.  Patient's husband was upset and stormed out without giving triage an opportunity to assess the situation.

## 2020-07-18 NOTE — Telephone Encounter (Signed)
Does he need [place] to call in a prescription for this okay to use the prescription he had previously, what exactly does he need for her, let us know.

## 2020-07-22 ENCOUNTER — Telehealth: Payer: Self-pay | Admitting: Orthopedic Surgery

## 2020-07-22 ENCOUNTER — Encounter: Payer: Self-pay | Admitting: Orthopedic Surgery

## 2020-07-22 ENCOUNTER — Ambulatory Visit (INDEPENDENT_AMBULATORY_CARE_PROVIDER_SITE_OTHER): Payer: Medicare HMO | Admitting: Orthopedic Surgery

## 2020-07-22 ENCOUNTER — Other Ambulatory Visit: Payer: Self-pay

## 2020-07-22 VITALS — BP 132/82 | HR 98 | Ht 61.0 in

## 2020-07-22 DIAGNOSIS — S52501A Unspecified fracture of the lower end of right radius, initial encounter for closed fracture: Secondary | ICD-10-CM

## 2020-07-22 DIAGNOSIS — T148XXA Other injury of unspecified body region, initial encounter: Secondary | ICD-10-CM | POA: Diagnosis not present

## 2020-07-22 DIAGNOSIS — S81801A Unspecified open wound, right lower leg, initial encounter: Secondary | ICD-10-CM | POA: Diagnosis not present

## 2020-07-22 DIAGNOSIS — S82141A Displaced bicondylar fracture of right tibia, initial encounter for closed fracture: Secondary | ICD-10-CM

## 2020-07-22 NOTE — Telephone Encounter (Signed)
Patient called to relay that she took her order for wheelchair and bedside commode to "a place in [place]", and states was told that they needed the reason for the equipment.  Order includes diagnosis codes. States fax number to send is [phone]. Patient was unable to relay name of pharmacy or phone number. Please advise.

## 2020-07-22 NOTE — Patient Instructions (Signed)
1.  Keep splint on your right wrist clean and dry 2.  Dressing on left elbow as needed.  It is ok to keep this open to air. 3.  Continue to change the dressing on the right knee as needed.  Use an antibiotic ointment and dry gauze 4.  No weight on your right leg.  It is ok to remove the brace if you are at home.  Recommend no bending of the right knee.  5.  Please monitor for signs of infection including redness, pus or worsening pain 6.  Follow up in 1 week for repeat XR of the right wrist and evaluation of the right knee.  7.  Prescription sent for a wheelchair and bedside commode

## 2020-07-22 NOTE — Telephone Encounter (Signed)
I called [facility] and per Erasmo Downer they needed the wording written out so the patient can get the information under her insurance and she does not have to pay out of pocket. Orders was re faxed and patient is aware. No other concerns.

## 2020-07-22 NOTE — Progress Notes (Signed)
New Patient Visit  Assessment: [name] is a 79 y.o. female with the following: 1.  Right distal radius fracture, minimally displaced 2.  Right tibial plateau fracture, minimally displaced with no joint line depression 3.  Degloving injury to the anterior right knee 4.  Medial left elbow skin tears  Plan: [name] sustained multiple injuries, all of which can be managed without surgery at this time.  Regarding her right distal radius fracture it is minimally displaced and appropriately splinted.  I have advised her to monitor the splint material in the posterior aspect of her elbow.  She is to keep the splint clean, dry and intact until the follow-up appointment.  Her right tibial plateau fracture remains minimally displaced, and she is to remain without weightbearing on the right leg.  Under normal circumstances range of motion exercises would be appropriate.  However, she is also sustained a degloving injury overlying the right knee, and maintaining a straight leg will allow this tissue to heal.  I will also monitor the skin around her right knee very closely to ensure that the blood flow has not been disrupted.  She also has multiple skin tears over the medial left elbow, which are healing appropriately at this time.  Both the right knee, and the left elbow were dressed with clean gauze in clinic today.  She should continue to use the knee immobilizer on her right leg, and remain without weightbearing.  Due to the appearance of the skin on the right knee we will plan to see her back in approximately 1 week for repeat evaluation.  If her knee pain worsens, purulent drainage appears or she feels generally unwell, she should contact clinic for an earlier appointment.  At the next visit, we will plan to get repeat x-rays on the right wrist, and transition her into a cast.   Follow-up: Return in about 1 week (around 07/29/2020).   X-rays of the right wrist in the  splint  Subjective:  Chief Complaint  Patient presents with  . Knee Pain    right knee pain,   . Wrist Pain    right wrist pain,     History of Present Illness: [name] is a 79 y.o. female who presents for multiple injuries after sustaining a fall a few days ago.  She states that she tripped while walking, and landed primarily on her right side.  She sustained injuries to her right wrist, her right knee the left side of her face, as well as her left elbow.  She was discharged home, and has remained without weightbearing.  She was given a knee immobilizer for her right knee, and the right wrist was splinted.  Her left elbow was also dressed with a soft dressing.  Her pain continues to be well controlled.  She has done well since discharge from the hospital.  She denies any numbness or tingling.  Review of Systems: No fevers or chills No numbness or tingling No chest pain No shortness of breath No vision loss  Medical History:  Past Medical History:  Diagnosis Date  . Anxiety states   . Breast cyst   . Cancer ([place])    skin  . Cataract   . Constipation   . Esophageal stricture   . Essential hypertension 12/18/2015  . Fracture, rib   . GERD (gastroesophageal reflux disease)   . Hiatal hernia   . Lower extremity edema 12/18/2015  . Osteoporosis   . Other and unspecified hyperlipidemia   .  PAC (premature atrial contraction) 12/30/2015  . Palpitations 12/18/2015  . Unspecified hypothyroidism   . Vertebral fracture, osteoporotic [facility]     Past Surgical History:  Procedure Laterality Date  . ABDOMINAL HYSTERECTOMY    . APPENDECTOMY    . BREAST BIOPSY     bil cysts  . CATARACT EXTRACTION    . ESOPHAGOGASTRODUODENOSCOPY N/A 01/19/2018   Procedure: ESOPHAGOGASTRODUODENOSCOPY (EGD);  Surgeon: Clarene Essex, MD;  Location: [facility];  Service: Endoscopy;  Laterality: N/A;  with flouro  . EYE SURGERY    . HAND SURGERY Left   . IR GENERIC HISTORICAL  12/10/2016   IR  RADIOLOGIST EVAL & MGMT 12/10/2016 [facility]  . IR GENERIC HISTORICAL  12/23/2016   IR KYPHO THORACIC WITH BONE BIOPSY 12/23/2016 Luanne Bras, MD [facility]  . PARTIAL HYSTERECTOMY    . THYROID LOBECTOMY     right  . VERTEBROPLASTY      Family History  Problem Relation Age of Onset  . Goiter Father   . Alcohol abuse Father   . Heart disease Sister   . Cancer Sister        breast and skin  . Goiter Brother   . Dementia Brother   . Hyperlipidemia Brother   . Hernia Brother   . Cancer Brother        throat  . Cancer Brother        neck  . Heart disease Brother   . Early death Brother        auto accident   Social History   Tobacco Use  . Smoking status: Former Smoker    Types: Cigarettes    Quit date: 11/18/2005    Years since quitting: 14.6  . Smokeless tobacco: Never Used  . Tobacco comment: Non-smoker  quit 12-13 years  Vaping Use  . Vaping Use: Never used  Substance Use Topics  . Alcohol use: No  . Drug use: No    Allergies  Allergen Reactions  . Bisphosphonates Other (See Comments)    Difficulty swallowing   . Ciprofloxacin Nausea And Vomiting  . Codeine Nausea And Vomiting  . Sulfa Antibiotics Nausea And Vomiting    Current Meds  Medication Sig  . acetaminophen (TYLENOL) 500 MG tablet Take 500 mg by mouth every 8 (eight) hours as needed for mild pain or moderate pain.  Marland Kitchen atorvastatin (LIPITOR) 80 MG tablet Take 1 tablet (80 mg total) by mouth daily.  . Calcium-Phosphorus-Vitamin D ([place]) [phone] MG-MG-UNIT CHEW Chew 1 each by mouth 2 (two) times daily.  . Cholecalciferol (VITAMIN D3) 2000 units CHEW Chew 4,000 Units by mouth daily.   . furosemide (LASIX) 20 MG tablet TAKE 1 TABLET EVERY DAY  . hydrochlorothiazide (HYDRODIURIL) 25 MG tablet TAKE 1 TABLET (25 MG TOTAL) BY MOUTH DAILY.  Marland Kitchen HYDROcodone-acetaminophen (NORCO/VICODIN) 5-325 MG tablet Take 1 tablet by mouth every 4 (four) hours as needed for moderate pain.  Marland Kitchen  levothyroxine (SYNTHROID) 100 MCG tablet Take 0.5 tablets (50 mcg total) by mouth every Monday, Tuesday, Wednesday, Thursday, and Friday.  . metoprolol tartrate (LOPRESSOR) 25 MG tablet Take 1 tablet (25 mg total) by mouth 2 (two) times daily as needed.  . Misc. Devices (COMMODE BEDSIDE) MISC Bedside commode due to tibial fracture  . Misc. Devices [facility] MISC Pt needs wheelchair due to extremity fractures  . Multiple Vitamin (MULTIVITAMIN WITH MINERALS) TABS tablet Take 2 tablets by mouth daily.   Marland Kitchen omeprazole (PRILOSEC) 40 MG capsule TAKE ONE (1) CAPSULE  EACH DAY  . oxazepam (SERAX) 15 MG capsule Take 1 capsule (15 mg total) by mouth 3 (three) times daily as needed for sleep.  . polyethylene glycol (MIRALAX / GLYCOLAX) packet Take 17 g by mouth daily as needed for moderate constipation.   . potassium chloride 20 MEQ/15ML (10%) SOLN TAKE 7.5ML (1&1/2 TEASPOONFUL) BY MOUTH DAILY AS NEEDED FOR CRAMPING  . sucralfate (CARAFATE) 1 g tablet TAKE 1 TABLET TWICE DAILY  . triamcinolone ointment (KENALOG) 0.5 % Apply 1 application topically 2 (two) times daily.    Objective: BP 132/82   Pulse 98   Ht 5\' 1"  (1.549 m)   BMI 23.05 kg/m   Physical Exam:  General: Alert and oriented, no acute distress.  Ecchymosis over into around her left eye. Gait: Unable to ambulate due to her injuries.  Seated in a wheelchair  Evaluation of the right upper extremity demonstrates a splint that is clean, dry and intact.  There is no skin breakdown in the posterior aspect of her upper arm.  Her fingers do appear to be swollen, but she is able to flex and extend all exposed digits.  Sensation is intact in all exposed fingers.  The right lower extremity has a large laceration directly anterior to the patella.  This is essentially a reverse C, consistent with a degloving type injury.  The overlying skin is purple in appearance, but easily blanchable.  She does tolerate gentle range of motion at the knee.  There is  tenderness to palpation, particularly over the lateral aspect of her knee.  She is able to dorsiflex her ankle and great toe.  Sensation is intact distally.  There is no drainage or fluid collections appreciated.  Left upper extremity has bruising, and multiple skin tears over the medial aspect of the elbow.  Sensation is intact distally.  She is able to fully range her left elbow and her wrist.   Left Elbow    Right knee      IMAGING: I personally  reviewed the following images:  X-rays of the right wrist demonstrates a mildly displaced distal radius fracture.  There is a volar fragment that has displaced some.  Small ulnar styloid fracture.  X-rays and CT scan of the right knee demonstrates no obvious depression of the tibial plateau, but there does appear to be a vertical fracture extending through the lateral compartment to the joint line.  Overall poor bone quality.  New Medications:  No orders of the defined types were placed in this encounter.     Mordecai Rasmussen, MD  07/22/2020 3:08 PM

## 2020-07-24 NOTE — Telephone Encounter (Signed)
Taken care of by Ortho, she has been seen by them recently, should would have needed a visit here

## 2020-07-28 ENCOUNTER — Other Ambulatory Visit: Payer: Self-pay | Admitting: Family Medicine

## 2020-07-29 ENCOUNTER — Ambulatory Visit: Payer: Medicare HMO

## 2020-07-29 ENCOUNTER — Ambulatory Visit (INDEPENDENT_AMBULATORY_CARE_PROVIDER_SITE_OTHER): Payer: Medicare HMO | Admitting: Orthopedic Surgery

## 2020-07-29 ENCOUNTER — Encounter: Payer: Self-pay | Admitting: Orthopedic Surgery

## 2020-07-29 ENCOUNTER — Other Ambulatory Visit: Payer: Self-pay

## 2020-07-29 VITALS — BP 127/82 | HR 105

## 2020-07-29 DIAGNOSIS — S52501A Unspecified fracture of the lower end of right radius, initial encounter for closed fracture: Secondary | ICD-10-CM

## 2020-07-29 MED ORDER — HYDROCODONE-ACETAMINOPHEN 5-325 MG PO TABS
1.0000 | ORAL_TABLET | Freq: Four times a day (QID) | ORAL | 0 refills | Status: AC | PRN
Start: 2020-07-29 — End: 2020-08-03

## 2020-07-29 MED ORDER — MISC. DEVICES MISC: 0 refills | Status: AC

## 2020-07-29 NOTE — Progress Notes (Addendum)
Orthopaedic Clinic Return  Assessment: [name] is a 79 y.o. female with the following: 1.  Right distal radius fracture; continue to treat nonoperatively 2.  Right tibial plateau fracture; treat nonoperatively 3.  Degloving injury, anterior right knee; stable, improving - will continue to monitor closely.  4.  Left elbow skin tears, stable   Plan: [name] continues to improve after sustaining multiple injuries from a fall.  The skin tears on the left elbow are improving and do not need further dressing changes.  Her distal radius fracture is stable based on today's radiographs, and we have transitioned her to a cast.  This will remain in place for 3 to 4 weeks.  In regards to her right knee, her pain is well controlled with hydrocodone, she is tolerating the knee immobilizer.  The skin overlying her patella is improving, but I would like to continue to monitor this closely to ensure that the blood supply has not been further damaged.  As result, we will we will see her back in clinic in approximately 1 week for repeat evaluation.  We also provided her with a prescription for rolling walker that she can use when she is able to bear weight through her right wrist, and her right leg.  Cast application - right short arm cast  Verbal consent was obtained and the correct extremity was identified. A well padded, appropriately molded short arm cast was applied to the right arm Fingers remained warm and well perfused.  There were no sharp edges Patient tolerated the procedure well Cast care instructions were provided   Meds ordered this encounter  Medications  . Misc. Devices MISC    Sig: Use rolling walker to ambulate around the house as needed.    Dispense:  1 each    Refill:  0  . HYDROcodone-acetaminophen (NORCO/VICODIN) 5-325 MG tablet    Sig: Take 1 tablet by mouth every 6 (six) hours as needed for up to 5 days for moderate pain.    Dispense:  10 tablet    Refill:  0     There is no height or weight on file to calculate BMI.  Follow-up: Return in about 1 week (around 08/05/2020).   Subjective:  Chief Complaint  Patient presents with  . Knee Pain    History of Present Illness: [name] is a 79 y.o. female who returns to clinic with multiple injuries sustained in a fall.  She was last seen in clinic approximately 1 week ago.  At that time, I recommended continued nonoperative treatment for a right distal radius fracture, as well as a right tibial plateau fracture.  She also had some skin tears in the left elbow that we want to continue to monitor, as well as a degloving injury to the anterior aspect of her right knee.  Her pain has been well controlled.  She is remain nonweightbearing on both the right upper and right lower extremities.  If continued with dressing changes to her right knee.  They are no longer changing the dressings on her left elbow.  Review of Systems: No fevers or chills No numbness or tingling No chest pain No shortness of breath  Objective: BP 127/82   Pulse (!) 105   Physical Exam:  Alert, seated comfortably in a wheelchair  Unable to ambulate  Evaluation of the right hand demonstrates some swelling to the exposed fingers.  The splint remains clean, dry and intact.  There is no skin breakdown of the  proximal aspect of the splint.  She is able to wiggle her fingers.  Sensation is intact distally.  Evaluation of the right knee demonstrates improved swelling.  There continues to be some ecchymosis and congestion overlying the knee, however this is improved in comparison to the previous evaluation.  Range of motion was not tested due to the tenuous nature of the skin at this point.  Dorsiflexion of her ankle and great toe intact.  IMAGING: I personally ordered and reviewed the following images:   X-rays of the right wrist are obtained in clinic today, and compared to previous x-rays.  Overall, this demonstrates  maintenance of the alignment without interval subsidence or displacement.  Adequate positioning of the distal radius fracture.  Mordecai Rasmussen, MD 07/29/2020 3:01 PM

## 2020-07-29 NOTE — Patient Instructions (Signed)
General Cast Instructions  1.  You were placed in a cast in clinic today.  Please keep the cast material clean, dry and intact.  Please do not use anything to itch the under the cast.  If it gets itchy, you can consider taking benadryl, or similar medication.  If the cast material gets wet, place it on a towel and use a hair dryer on a low setting. 2.  Tylenol or Ibuprofen/Naproxen as needed.   3.  Recommend elevating your extremity as much as possible to help with swelling. 4.  F/u 1 week for skin check of the right knee 5.  Norco prescription sent, this will be the last prescription 6.  No weight on your right leg, please do not bend your right knee

## 2020-07-30 DIAGNOSIS — S52501A Unspecified fracture of the lower end of right radius, initial encounter for closed fracture: Secondary | ICD-10-CM | POA: Diagnosis not present

## 2020-08-06 ENCOUNTER — Ambulatory Visit (INDEPENDENT_AMBULATORY_CARE_PROVIDER_SITE_OTHER): Payer: Medicare HMO | Admitting: Orthopedic Surgery

## 2020-08-06 ENCOUNTER — Ambulatory Visit (INDEPENDENT_AMBULATORY_CARE_PROVIDER_SITE_OTHER): Payer: Medicare HMO

## 2020-08-06 ENCOUNTER — Other Ambulatory Visit: Payer: Self-pay

## 2020-08-06 ENCOUNTER — Encounter: Payer: Self-pay | Admitting: Orthopedic Surgery

## 2020-08-06 DIAGNOSIS — S81801D Unspecified open wound, right lower leg, subsequent encounter: Secondary | ICD-10-CM

## 2020-08-06 DIAGNOSIS — S52501D Unspecified fracture of the lower end of right radius, subsequent encounter for closed fracture with routine healing: Secondary | ICD-10-CM

## 2020-08-06 DIAGNOSIS — Z23 Encounter for immunization: Secondary | ICD-10-CM | POA: Diagnosis not present

## 2020-08-06 DIAGNOSIS — T148XXD Other injury of unspecified body region, subsequent encounter: Secondary | ICD-10-CM

## 2020-08-06 DIAGNOSIS — S52501A Unspecified fracture of the lower end of right radius, initial encounter for closed fracture: Secondary | ICD-10-CM

## 2020-08-06 DIAGNOSIS — S82141D Displaced bicondylar fracture of right tibia, subsequent encounter for closed fracture with routine healing: Secondary | ICD-10-CM | POA: Diagnosis not present

## 2020-08-06 DIAGNOSIS — S81801A Unspecified open wound, right lower leg, initial encounter: Secondary | ICD-10-CM

## 2020-08-06 DIAGNOSIS — S82141A Displaced bicondylar fracture of right tibia, initial encounter for closed fracture: Secondary | ICD-10-CM

## 2020-08-06 DIAGNOSIS — T148XXA Other injury of unspecified body region, initial encounter: Secondary | ICD-10-CM

## 2020-08-06 NOTE — Progress Notes (Signed)
Orthopaedic Clinic Return  Assessment: [name] is a 79 y.o. female with the following: 1.  Right distal radius fracture; nonop in a cast 2.  Right tibial plateau fracture; nonop in a knee immobilizer 3.  Anterior right knee degloving injury  Plan: [name] continues to recover well from her injury sustained in a fall.  Regarding her right distal radius fracture, we will continue to monitor this in a cast.  I have urged her to work on range of motion of her exposed fingers.  Medications as needed.  We will plan to remove the cast at the next visit, and transition her to a removable wrist splint depending on the x-rays.  Regarding her right knee, the skin overlying her right knee is healing very well.  As a result, she can now start to work on range of motion.  She is to continue with nonweightbearing on her right lower extremity, but I would like for her to be at 90 degrees when I see her again in approximately 2 weeks.  We will likely start weightbearing at that time.  All of this was discussed the patient, and she is amenable to this plan.  She also stated that she has been having some headaches since the fall.  She continues to have some bruising in her forehead, and this remains tender.  I have urged her to discuss this with her primary care provider.  It is possible that she sustained a mild concussion during the fall, but I do not want this to cause her any further issues.  She stated her understanding.  No orders of the defined types were placed in this encounter.   There is no height or weight on file to calculate BMI.  Follow-up: Return in about 2 weeks (around 08/20/2020).  Please obtain x-rays of the right wrist out of the cast, as well as the right knee.  Subjective:  Chief Complaint  Patient presents with  . Wrist Injury    Right fracture  . Knee Injury    Right    History of Present Illness: [name] is a 79 y.o. female who returns to clinic for  repeat evaluation of the above-stated injuries.  She continues to do well.  She remains nonweightbearing on both the right wrist, as well as the right leg.  She has occasional pains in her right wrist but otherwise has tolerated her cast well.  Minimal pain in her right knee.  She continues to cover the laceration overlying her right knee, and use the knee immobilizer.  Review of Systems: No fevers or chills No numbness or tingling No chest pain No shortness of breath She does complain of headaches  Objective:  Physical Exam:  Elderly female, seated in a wheelchair Unable to ambulate.  Evaluation of the left elbow demonstrates previous lacerations are healing well.  There is no drainage, or associated redness.  She is full range of motion of the left elbow.  The cast on her right wrist remains clean, dry and intact.  There is no skin breakdown around the edges of the cast.  She has full range of motion of the exposed fingers.  There is some residual swelling in her fingers.  Sensation is otherwise intact.  Evaluation of the right knee demonstrates improved healing of the degloving injury that she has sustained.  Blood flow remains good.  The bruising has improved.  She continues to have a scab overlying the right knee without associated redness or  drainage.  Negative Lachman.  She can easily get to full extension.  She tolerates flexion to approximately 70 degrees without discomfort.  Minimal tenderness to palpation about the right knee.  IMAGING: I personally ordered and reviewed the following images:  No new imaging today.  Mordecai Rasmussen, MD 08/06/2020 12:24 PM

## 2020-08-06 NOTE — Patient Instructions (Signed)
1.  Ok to start bending your right knee, by the next visit, you should be able to bend your knee comfortably to 90 degrees 2.  Do not walk on your right leg yet; wear the knee brace  3.  Medications as needed; reduce the amount of Hydrocodone

## 2020-08-20 ENCOUNTER — Other Ambulatory Visit: Payer: Self-pay

## 2020-08-20 ENCOUNTER — Other Ambulatory Visit: Payer: Self-pay | Admitting: Family Medicine

## 2020-08-20 ENCOUNTER — Ambulatory Visit (INDEPENDENT_AMBULATORY_CARE_PROVIDER_SITE_OTHER): Payer: Medicare HMO | Admitting: Orthopedic Surgery

## 2020-08-20 ENCOUNTER — Ambulatory Visit: Payer: Medicare HMO

## 2020-08-20 ENCOUNTER — Encounter: Payer: Self-pay | Admitting: Orthopedic Surgery

## 2020-08-20 VITALS — BP 118/84 | HR 108 | Ht 61.0 in

## 2020-08-20 DIAGNOSIS — S82141A Displaced bicondylar fracture of right tibia, initial encounter for closed fracture: Secondary | ICD-10-CM

## 2020-08-20 DIAGNOSIS — S52501A Unspecified fracture of the lower end of right radius, initial encounter for closed fracture: Secondary | ICD-10-CM | POA: Diagnosis not present

## 2020-08-20 NOTE — Patient Instructions (Addendum)
Wrist Fracture Rehab  Hold exercises for at least 5 seconds.  Complete  exercise 10 times, at least 2-3 times per week  Ask your health care provider which exercises are safe for you. Do exercises exactly as told by your health care provider and adjust them as directed. It is normal to feel mild stretching, pulling, tightness, or discomfort as you do these exercises. Stop right away if you feel sudden pain or your pain gets worse. Do not begin these exercises until told by your health care provider. Stretching and range-of-motion exercises These exercises warm up your muscles and joints and improve the movement and flexibility of your wrist and hand. These exercises also help to relieve pain, numbness, and tingling. Finger flexion and extension 1. Sit or stand with your elbow at your side. 2. Open and stretch your left / right fingers as wide as you can (extension). 3. Hold this position for 5 seconds. 4. Close your left / right fingers into a gentle fist (flexion). 5. Hold this position for 5 seconds. 6. Slowly return to the starting position. Repeat 10 times. Complete this exercise 1-2 times a day. Wrist flexion 1. Bend your left / right elbow to a 90-degree angle (right angle) with your palm facing the floor. 2. Bend your wrist forward so your fingers point toward the floor (flexion). 3. Hold this position for ___________ seconds. 4. Slowly return to the starting position. Repeat __________ times. Complete this exercise __________ times a day. Wrist extension 1. Bend your left / right elbow to a 90-degree angle (right angle) with your palm facing the floor. 2. Bend your wrist backward so your fingers point toward the ceiling (extension). 3. Hold this position for ___________ seconds. 4. Slowly return to the starting position. Repeat __________ times. Complete this exercise __________ times a day. Ulnar deviation 1. Bend your left / right elbow to a 90-degree angle (right angle), and  rest your forearm on a table with your palm facing down. 2. Keeping your hand flat on the table, bend your left / right wrist toward your small finger (pinkie). This is ulnar deviation. 3. Hold this position for __________ seconds. 4. Slowly return to the starting position. Repeat __________ times. Complete this exercise __________ times a day. Radial deviation 1. Bend your left / right elbow to a 90-degree angle (right angle), and rest your forearm on a table with your palm facing down. 2. Keeping your hand flat on the table, bend your left / right wrist toward your thumb. This is radial deviation. 3. Hold this position for __________ seconds. 4. Slowly return to the starting position. Repeat __________ times. Complete this exercise __________ times a day. Forearm rotation, supination 1. Stand or sit with your left / right elbow bent to a 90-degree angle (right angle) at your side. Position your forearm so that the thumb is facing the ceiling (neutral position). 2. Turn (rotate) your palm up toward the ceiling (supination), stopping when you feel a gentle stretch. 3. Hold this position for __________ seconds. 4. Slowly return to the starting position. Repeat __________ times. Complete this exercise __________ times a day. Forearm rotation, pronation 1. Stand or sit with your left / right elbow bent to a 90-degree angle (right angle) at your side. Position your forearm so that the thumb is facing the ceiling (neutral position). 2. Rotate your palm down toward the floor (pronation), stopping when you feel a gentle stretch. 3. Hold this position for __________ seconds. 4. Slowly return to the  starting position. Repeat __________ times. Complete this exercise __________ times a day. Wrist flexion, assisted  1. Extend your left / right arm in front of you and turn your palm down toward the floor. ? If told by your health care provider, bend your left / right arm to a 90-degree angle (right  angle) at your side. 2. Using your uninjured hand, gently press over the back of your left / right hand (assisted) to bend your wrist and fingers toward the floor (flexion). Go as far as you can to feel a stretch without causing pain. 3. Hold this position for __________ seconds. 4. Slowly return to the starting position. Repeat __________ times. Complete this exercise __________ times a day. Wrist extension, assisted  1. Extend your left / right arm in front of you and turn your palm up toward the ceiling. ? If told by your health care provider, bend your left / right arm to a 90-degree angle (right angle) at your side. 2. Using your uninjured hand, gently press over the palm of your left / right hand (assisted) to bend your wrist and fingers toward the floor (extension). Go as far as you can to feel a stretch without causing pain. 3. Hold this position for __________ seconds. 4. Slowly return to the starting position. Repeat __________ times. Complete this exercise __________ times a day. Assisted forearm rotation, supination 1. Stand or sit with your arms at your sides. 2. Bend your left / right elbow to a 90-degree angle (right angle). 3. Using your uninjured hand, turn your left / right palm up toward the ceiling (assisted supination) until you feel a gentle stretch in the inside of your forearm. 4. Hold this position for __________ seconds. 5. Slowly return to the starting position. Repeat __________ times. Complete this exercise __________ times a day. Assisted forearm rotation, pronation 1. Stand or sit with your arms at your sides. 2. Bend your left / right elbow to a 90-degree angle (right angle). 3. Using your uninjured hand, turn your left / right palm down toward the floor (assisted pronation) until you feel a gentle stretch in the top of your forearm. 4. Hold this position for __________ seconds. 5. Slowly return to the starting position. Repeat __________ times. Complete this  exercise __________ times a day. Strengthening exercises These exercises build strength and endurance in your wrist and hand. Endurance is the ability to use your muscles for a long time, even after they get tired. Wrist flexion 1. Sit with your left / right forearm supported on a table. Your elbow should be at waist height. 2. Rest your hand over the edge of the table, palm up. 3. Gently grasp a __________ lb (kg) weight. Or, hold an exercise band or tube in both hands, keeping your hands at the same level and hip distance apart. There should be slight tension in the exercise band or tube. 4. Without moving your forearm or elbow, slowly bend your wrist up toward the ceiling (wrist flexion). 5. Hold this position for __________ seconds. 6. Slowly return to the starting position. Repeat __________ times. Complete this exercise __________ times a day. Wrist extension 1. Sit with your left / right forearm supported on a table. Your elbow should be at waist height. 2. Rest your hand over the edge of the table, palm down. 3. Gently grasp a __________ lb (kg) weight. Or, hold an exercise band or tube in both hands, keeping your hands at the same level and hip distance apart.  There should be slight tension in the exercise band or tube. 4. Without moving your forearm or elbow, slowly curl your hand up toward the ceiling (extension). 5. Hold this position for __________ seconds. 6. Slowly return to the starting position. Repeat __________ times. Complete this exercise __________ times a day. Forearm rotation, supination  1. Sit with your left / right forearm supported on a table. Your elbow should be at waist height. 2. Rest your hand over the edge of the table, palm down. 3. Gently grasp a lightweight hammer near the head. As this exercise gets easier for you, try holding the hammer farther down the handle. 4. Without moving your elbow, slowly rotate your palm up toward the ceiling  (supination). 5. Hold this position for __________ seconds. 6. Slowly return to the starting position. Repeat __________ times. Complete this exercise __________ times a day. Forearm rotation, pronation  1. Sit with your left / right forearm supported on a table. Your elbow should be at waist height. 2. Rest your hand over the edge of the table, palm up. 3. Gently grasp a lightweight hammer near the head. As this exercise gets easier for you, try holding the hammer farther down the handle. 4. Without moving your elbow, slowly rotate your palm down toward the floor (pronation). 5. Hold this position for __________ seconds. 6. Slowly return to the starting position. Repeat __________ times. Complete this exercise __________ times a day. Grip strengthening  1. Hold one of these items in your left / right hand: a dense sponge, a stress ball, or a large, rolled sock. 2. Slowly squeeze the object as hard as you can without increasing any pain. 3. Hold your squeeze for __________ seconds. 4. Slowly release your grip. Repeat __________ times. Complete this exercise __________ times a day. This information is not intended to replace advice given to you by your health care provider. Make sure you discuss any questions you have with your health care provider. Document Revised: 01/22/2019 Document Reviewed: 12/25/2018 Elsevier Patient Education  [employer].

## 2020-08-20 NOTE — Progress Notes (Signed)
[facility] Return  Assessment: [name] is a 79 y.o. female with the following: 1.  Right distal radius fracture; transition to removable wrist splint 2.  Right tibial plateau fracture; XR stable, ok to advance WBAT using a walker 3.  Anterior right knee degloving injury; healing well, no concerns at this time  Plan: Mrs Odaniel continues to recover well from her injuries sustained in a fall.  Her right knee XR are reassuring.  She has minimal pain and good ROM on exam.  She can now start to bear weight, using a walker.  Regarding her anterior knee laceration, this has continued to heal well, low concern for loss of blood flow or infection at this time.    Body mass index is 23.05 kg/m.  Follow-up: Return in about 4 weeks (around 09/17/2020).  No new XR needed  Subjective:  Chief Complaint  Patient presents with  . Knee Pain  . Wrist Pain    History of Present Illness: [name] is a 79 y.o. female who returns to clinic for repeat evaluation of the above-stated injuries.  She continues to do well.  She remains nonweightbearing on both the right wrist, as well as the right leg.  She has occasional pains in her right wrist but otherwise has tolerated her cast well.  Minimal pain in her right knee.  She continues to cover the laceration overlying her right knee, and use the knee immobilizer.  Review of Systems: No fevers or chills No numbness or tingling No chest pain No shortness of breath She does complain of headaches  Objective:  Physical Exam:  Elderly female, seated in a wheelchair  Left elbow laceration healing well.  No surrounding erythema  Cast removed from right wrist.  Dry skin.  Minimal swelling.  No ecchymosis.  Able to make a fist.  Tolerates about 30 degrees flexion and extension with some discomfort.  Sensation intact distally.  Mild tenderness over the distal radius.   Right knee anterior laceration healing well.  Minimal  surrounding erythema or ecchymosis.  ROM 5-100 without discomfort.  No effusion in the knee.  Minimal tenderness medial and lateral joint line.   IMAGING: I personally ordered and reviewed the following images:  XR right wrist demonstrates mild dorsal angulation of the distal radius fracture.  Joint line remains intact. Slight anterior subluxation compared to injury XR.  Stable  Impression:  Stable distal radius fracture with slight angulation.   XR right knee demonstrates maintenance of alignment without further joint line depression.  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   Impression:  Stable, healing tibial plateau fracture.   Mordecai Rasmussen, MD 08/20/2020 10:42 AM

## 2020-08-28 ENCOUNTER — Other Ambulatory Visit: Payer: Self-pay | Admitting: Family Medicine

## 2020-08-30 ENCOUNTER — Other Ambulatory Visit: Payer: Self-pay | Admitting: Family Medicine

## 2020-09-17 ENCOUNTER — Ambulatory Visit (INDEPENDENT_AMBULATORY_CARE_PROVIDER_SITE_OTHER): Payer: Medicare HMO

## 2020-09-17 ENCOUNTER — Other Ambulatory Visit: Payer: Self-pay

## 2020-09-17 ENCOUNTER — Ambulatory Visit (INDEPENDENT_AMBULATORY_CARE_PROVIDER_SITE_OTHER): Payer: Medicare HMO | Admitting: Family Medicine

## 2020-09-17 ENCOUNTER — Encounter: Payer: Self-pay | Admitting: Family Medicine

## 2020-09-17 VITALS — BP 120/78 | HR 99 | Temp 97.0°F | Ht 61.0 in | Wt 116.5 lb

## 2020-09-17 DIAGNOSIS — E034 Atrophy of thyroid (acquired): Secondary | ICD-10-CM | POA: Diagnosis not present

## 2020-09-17 DIAGNOSIS — R29898 Other symptoms and signs involving the musculoskeletal system: Secondary | ICD-10-CM | POA: Diagnosis not present

## 2020-09-17 DIAGNOSIS — S62101D Fracture of unspecified carpal bone, right wrist, subsequent encounter for fracture with routine healing: Secondary | ICD-10-CM | POA: Diagnosis not present

## 2020-09-17 DIAGNOSIS — N1832 Chronic kidney disease, stage 3b: Secondary | ICD-10-CM | POA: Diagnosis not present

## 2020-09-17 DIAGNOSIS — I10 Essential (primary) hypertension: Secondary | ICD-10-CM | POA: Diagnosis not present

## 2020-09-17 DIAGNOSIS — Z780 Asymptomatic menopausal state: Secondary | ICD-10-CM | POA: Diagnosis not present

## 2020-09-17 DIAGNOSIS — R7303 Prediabetes: Secondary | ICD-10-CM | POA: Diagnosis not present

## 2020-09-17 DIAGNOSIS — E782 Mixed hyperlipidemia: Secondary | ICD-10-CM

## 2020-09-17 LAB — BAYER DCA HB A1C WAIVED: HB A1C (BAYER DCA - WAIVED): 5.8 % (ref <7.0)

## 2020-09-17 MED ORDER — OXAZEPAM 15 MG PO CAPS: 15.0000 mg | ORAL_CAPSULE | Freq: Three times a day (TID) | ORAL | 2 refills | Status: DC | PRN

## 2020-09-17 MED ORDER — LEVOTHYROXINE SODIUM 100 MCG PO TABS
50.0000 ug | ORAL_TABLET | ORAL | 3 refills | Status: DC
Start: 2020-09-17 — End: 2020-12-25

## 2020-09-17 MED ORDER — FUROSEMIDE 20 MG PO TABS
20.0000 mg | ORAL_TABLET | Freq: Every day | ORAL | 3 refills | Status: DC
Start: 2020-09-17 — End: 2020-10-03

## 2020-09-17 NOTE — Progress Notes (Signed)
BP 120/78   Pulse 99   Temp (!) 97 F (36.1 C)   Ht 5\' 1"  (1.549 m)   Wt 116 lb 8 oz (52.8 kg)   SpO2 99%   BMI 22.01 kg/m    Subjective:   Patient ID: [name], female    DOB: 03/02/1941, 79 y.o.   MRN: [id number]  HPI: [name] is a 79 y.o. female presenting on 09/17/2020 for Hypothyroidism, Hypertension, and Medical Management of Chronic Issues   HPI Hypothyroidism recheck Patient is coming in for thyroid recheck today as well. They deny any issues with hair changes or heat or cold problems or diarrhea or constipation. They deny any chest pain or palpitations. They are currently on levothyroxine 100 micrograms   Hypertension Patient is currently on furosemide and hydrochlorothiazide and metoprolol, and their blood pressure today is 120/78, had a recent fall and felt like she almost passed out which caused him fractures in her arm and her knee.. Patient denies any lightheadedness or dizziness. Patient denies headaches, blurred vision, chest pains, shortness of breath, or weakness. Denies any side effects from medication and is content with current medication.   Hyperlipidemia Patient is coming in for recheck of his hyperlipidemia. The patient is currently taking atorvastatin. They deny any issues with myalgias or history of liver damage from it. They deny any focal numbness or weakness or chest pain.   Prediabetes Patient comes in today for recheck of his diabetes. Patient has been currently taking no medication currently. Patient is not currently on an ACE inhibitor/ARB. Patient has seen an ophthalmologist this year. Patient denies any issues with their feet. The symptom started onset as an adult CKD and hyperlipidemia and hypertension ARE RELATED TO DM   Patient had a recent fall with fracture of both her right knee and her right wrist with decreased strength and range of motion in that right wrist and she wants to go to physical therapy because she is not been able  to work on it on her own as much.  Relevant past medical, surgical, family and social history reviewed and updated as indicated. Interim medical history since our last visit reviewed. Allergies and medications reviewed and updated.  Review of Systems  Constitutional: Negative for chills and fever.  Eyes: Negative for visual disturbance.  Respiratory: Negative for chest tightness and shortness of breath.   Cardiovascular: Negative for chest pain and leg swelling.  Musculoskeletal: Positive for arthralgias. Negative for back pain, gait problem and myalgias.  Skin: Negative for rash.  Neurological: Positive for light-headedness. Negative for headaches.  Psychiatric/Behavioral: Negative for agitation and behavioral problems.  All other systems reviewed and are negative.   Per HPI unless specifically indicated above   Allergies as of 09/17/2020      Reactions   Bisphosphonates Other (See Comments)   Difficulty swallowing   Ciprofloxacin Nausea And Vomiting   Codeine Nausea And Vomiting   Sulfa Antibiotics Nausea And Vomiting      Medication List       Accurate as of September 17, 2020 10:52 AM. If you have any questions, ask your nurse or doctor.        acetaminophen 500 MG tablet Commonly known as: TYLENOL Take 500 mg by mouth every 8 (eight) hours as needed for mild pain or moderate pain.   atorvastatin 80 MG tablet Commonly known as: LIPITOR TAKE 1 TABLET EVERY DAY   Calcium-Phosphorus-Vitamin D [id number] MG-MG-UNIT Chew Commonly known as: Citracal Calcium Gummies Chew  1 each by mouth 2 (two) times daily.   Commode Bedside Misc Bedside commode due to tibial fracture   Wheelchair Misc Pt needs wheelchair due to extremity fractures   Misc. Devices Misc Use rolling walker to ambulate around the house as needed.   furosemide 20 MG tablet Commonly known as: LASIX TAKE 1 TABLET EVERY DAY   hydrochlorothiazide 25 MG tablet Commonly known as: HYDRODIURIL TAKE 1  TABLET EVERY DAY   levothyroxine 100 MCG tablet Commonly known as: SYNTHROID Take 0.5 tablets (50 mcg total) by mouth every Monday, Tuesday, Wednesday, Thursday, and Friday.   metoprolol tartrate 25 MG tablet Commonly known as: LOPRESSOR Take 1 tablet (25 mg total) by mouth 2 (two) times daily as needed.   multivitamin with minerals Tabs tablet Take 2 tablets by mouth daily.   omeprazole 40 MG capsule Commonly known as: PRILOSEC TAKE 1 CAPSULE EVERY DAY   oxazepam 15 MG capsule Commonly known as: SERAX Take 1 capsule (15 mg total) by mouth 3 (three) times daily as needed for sleep.   polyethylene glycol 17 g packet Commonly known as: MIRALAX / GLYCOLAX Take 17 g by mouth daily as needed for moderate constipation.   potassium chloride 20 MEQ/15ML (10%) Soln TAKE 7.5ML (1&1/2 TEASPOONFUL) BY MOUTH DAILY AS NEEDED FOR CRAMPING   sucralfate 1 g tablet Commonly known as: CARAFATE TAKE 1 TABLET TWICE DAILY   triamcinolone ointment 0.5 % Commonly known as: KENALOG Apply 1 application topically 2 (two) times daily.   Vitamin D3 50 MCG (2000 UT) Chew Chew 4,000 Units by mouth daily.        Objective:   BP 120/78   Pulse 99   Temp (!) 97 F (36.1 C)   Ht 5\' 1"  (1.549 m)   Wt 116 lb 8 oz (52.8 kg)   SpO2 99%   BMI 22.01 kg/m   Wt Readings from Last 3 Encounters:  09/17/20 116 lb 8 oz (52.8 kg)  07/17/20 122 lb (55.3 kg)  06/18/20 120 lb (54.4 kg)    Physical Exam Vitals and nursing note reviewed.  Constitutional:      General: She is not in acute distress.    Appearance: She is well-developed. She is not diaphoretic.  Eyes:     Conjunctiva/sclera: Conjunctivae normal.  Cardiovascular:     Rate and Rhythm: Normal rate and regular rhythm.     Heart sounds: Normal heart sounds. No murmur heard.   Pulmonary:     Effort: Pulmonary effort is normal. No respiratory distress.     Breath sounds: Normal breath sounds. No wheezing.  Musculoskeletal:        General:  No tenderness.     Right hand: No deformity, tenderness or bony tenderness. Decreased range of motion. Decreased strength of thumb/finger opposition. Normal sensation. Normal capillary refill.  Skin:    General: Skin is warm and dry.     Findings: No rash.  Neurological:     Mental Status: She is alert and oriented to person, place, and time.     Coordination: Coordination normal.  Psychiatric:        Behavior: Behavior normal.       Assessment & Plan:   Problem List Items Addressed This Visit      Cardiovascular and Mediastinum   Essential hypertension   Relevant Medications   furosemide (LASIX) 20 MG tablet     Endocrine   Hypothyroid - Primary   Relevant Medications   levothyroxine (SYNTHROID) 100 MCG tablet  Other Relevant Orders   TSH     Genitourinary   Chronic kidney disease (CKD) stage G3b/A1, moderately decreased glomerular filtration rate (GFR) between 30-44 mL/min/1.73 square meter and albuminuria creatinine ratio less than 30 mg/g (HCC)     Other   Hyperlipemia   Relevant Medications   furosemide (LASIX) 20 MG tablet   Prediabetes   Relevant Orders   Bayer DCA Hb A1c Waived    Other Visit Diagnoses    Postmenopausal       Relevant Orders   DG [facility] DEXA   Closed fracture of right wrist with routine healing, subsequent encounter       Relevant Orders   Ambulatory referral to Physical Therapy   Decreased grip strength       Relevant Orders   Ambulatory referral to Physical Therapy      Stop hydrochlorothiazide because of falls, will check thyroid today and A1c.  We will also do bone density scan.  Refer to physical therapy for her right hand to help see if we can get some grip strength back. Follow up plan: Return in about 3 months (around 12/16/2020), or if symptoms worsen or fail to improve, for Prediabetes and hypothyroidism and hypertension and anxiety.  Counseling provided for all of the vaccine components No orders of the defined types were  placed in this encounter.   Caryl Pina, MD [facility] 09/17/2020, 10:52 AM

## 2020-09-18 LAB — TSH: TSH: 0.425 u[IU]/mL — ABNORMAL LOW (ref 0.450–4.500)

## 2020-09-19 ENCOUNTER — Ambulatory Visit: Payer: Medicare HMO | Admitting: Orthopedic Surgery

## 2020-09-22 ENCOUNTER — Ambulatory Visit: Payer: Medicare HMO | Admitting: Physical Therapy

## 2020-09-22 ENCOUNTER — Telehealth: Payer: Self-pay

## 2020-09-22 DIAGNOSIS — M810 Age-related osteoporosis without current pathological fracture: Secondary | ICD-10-CM | POA: Diagnosis not present

## 2020-09-22 NOTE — Telephone Encounter (Signed)
  Incoming Patient Call  09/22/2020  What symptoms do you have? cough  How long have you been sick? Since last week   Have you been seen for this problem? Yes she was seen last week by Dr. Keturah Barre. He told her to use  alastine nasal spray but she was not able to get it because she needed a prescription was told it was OTC  If your provider decides to give you a prescription, which pharmacy would you like for it to be sent to? [facility]   Patient informed that this information will be sent to the clinical staff for review and that they should receive a follow up call.

## 2020-09-23 ENCOUNTER — Other Ambulatory Visit: Payer: Self-pay | Admitting: Family Medicine

## 2020-09-23 MED ORDER — AZELASTINE HCL 0.1 % NA SOLN: 1.0000 | Freq: Two times a day (BID) | NASAL | 12 refills | Status: DC

## 2020-09-23 NOTE — Progress Notes (Signed)
Patient aware and verbalizes understanding. 

## 2020-09-23 NOTE — Progress Notes (Unsigned)
Sent a prescription for the nasal spray for the patient

## 2020-09-29 ENCOUNTER — Ambulatory Visit: Payer: Medicare HMO | Attending: Family Medicine | Admitting: Physical Therapy

## 2020-09-29 ENCOUNTER — Encounter: Payer: Self-pay | Admitting: Physical Therapy

## 2020-09-29 ENCOUNTER — Other Ambulatory Visit: Payer: Self-pay

## 2020-09-29 DIAGNOSIS — R600 Localized edema: Secondary | ICD-10-CM | POA: Diagnosis not present

## 2020-09-29 DIAGNOSIS — M6281 Muscle weakness (generalized): Secondary | ICD-10-CM | POA: Diagnosis not present

## 2020-09-29 DIAGNOSIS — M25631 Stiffness of right wrist, not elsewhere classified: Secondary | ICD-10-CM | POA: Diagnosis not present

## 2020-09-29 DIAGNOSIS — M25531 Pain in right wrist: Secondary | ICD-10-CM | POA: Insufficient documentation

## 2020-09-29 NOTE — Therapy (Signed)
[facility] [place], [place], [address] Phone: [phone]   Fax:  [phone]  Physical Therapy Evaluation  Patient Details  Name: [name] MRN: [id number] Date of Birth: [birth year] Referring Provider (PT): Caryl Pina, MD   Encounter Date: 09/29/2020   PT End of Session - 09/29/20 1025    Visit Number 1    Number of Visits 12    Date for PT Re-Evaluation 11/10/20    Authorization Type [employer] Medicare (CQ modifier);  progress note every 10th visit    PT Start Time 0945    PT Stop Time 1019    PT Time Calculation (min) 34 min    Equipment Utilized During Treatment --   [facility]   Activity Tolerance Patient tolerated treatment well    Behavior During Therapy [facility] for tasks assessed/performed           Past Medical History:  Diagnosis Date  . Anxiety states   . Breast cyst   . Cancer ([place])    skin  . Cataract   . Constipation   . Esophageal stricture   . Essential hypertension 12/18/2015  . Fracture, rib   . GERD (gastroesophageal reflux disease)   . Hiatal hernia   . Lower extremity edema 12/18/2015  . Osteoporosis   . Other and unspecified hyperlipidemia   . PAC (premature atrial contraction) 12/30/2015  . Palpitations 12/18/2015  . Unspecified hypothyroidism   . Vertebral fracture, osteoporotic [facility])     Past Surgical History:  Procedure Laterality Date  . ABDOMINAL HYSTERECTOMY    . APPENDECTOMY    . BREAST BIOPSY     bil cysts  . CATARACT EXTRACTION    . ESOPHAGOGASTRODUODENOSCOPY N/A 01/19/2018   Procedure: ESOPHAGOGASTRODUODENOSCOPY (EGD);  Surgeon: Clarene Essex, MD;  Location: [facility];  Service: Endoscopy;  Laterality: N/A;  with flouro  . EYE SURGERY    . HAND SURGERY Left   . IR GENERIC HISTORICAL  12/10/2016   IR RADIOLOGIST EVAL & MGMT 12/10/2016 [facility]  . IR GENERIC HISTORICAL  12/23/2016   IR KYPHO THORACIC WITH BONE BIOPSY 12/23/2016 Luanne Bras, MD [facility]  .  PARTIAL HYSTERECTOMY    . THYROID LOBECTOMY     right  . VERTEBROPLASTY      There were no vitals filed for this visit.    Subjective Assessment - 09/29/20 1028    Subjective COVID-19 screening performed upon arrival. Patient arrives to physical therapy with right wrist and hand pain, decreased ROM and decreased strength secondary to a right wrist fractured sustained after a fall on 07/17/2020. Patient reported no surgery for R wrist and is out of wrist splint. Patient report difficulties with grabbing objects, dressing, and home activities. Patient reports gaining assistance from her husband as needed. Patient reports pain at worst as 9/10 and pain at best as 3/10. Patient's goals are to decrease pain, improve movement, improve strength, and improve ability to perform home activities and ADLs independently.    Pertinent History HTN, PAC, Osteoporosis    Limitations Lifting;House hold activities    Diagnostic tests x-ray:  Stable distal radius fracture with slight angulation    Patient Stated Goals "use wrist and hand better"    Currently in Pain? Yes    Pain Score 6     Pain Location Wrist    Pain Orientation Right    Pain Descriptors / Indicators Sore    Pain Type Acute pain    Pain Onset More than a  month ago    Pain Frequency Intermittent    Aggravating Factors  movement, gripping    Pain Relieving Factors resting    Effect of Pain on Daily Activities "hard to do all home stuff"              [facility] PT Assessment - 09/29/20 0001      Assessment   Medical Diagnosis Closed fracture of right wrist with routine healing, subsequent encounter; decreased grip strength    Referring Provider (PT) Caryl Pina, MD    Onset Date/Surgical Date 07/17/20    Hand Dominance Left    Next MD Visit "3 months"    Prior Therapy no      Precautions   Precautions Fall      Restrictions   Weight Bearing Restrictions No      Balance Screen   Has the patient fallen in the past 6 months  Yes    How many times? 1    Has the patient had a decrease in activity level because of a fear of falling?  No    Is the patient reluctant to leave their home because of a fear of falling?  No      Home [employer] residence    Living Arrangements Spouse/significant other      Prior Function   Level of Independence Needs assistance with homemaking;Needs assistance with ADLs      Observation/Other Assessments-Edema    Edema Circumferential      Circumferential Edema   Circumferential - Right 16.7 cm at bilateral malleoli    Circumferential - Left  15.0 cm at bilateral malleoli      ROM / Strength   AROM / PROM / Strength AROM;PROM;Strength      AROM   Overall AROM  Deficits;Due to pain    AROM Assessment Site Wrist;Forearm    Right/Left Forearm Right    Right Forearm Pronation 70 Degrees    Right Forearm Supination 60 Degrees    Right/Left Wrist Right    Right Wrist Extension 18 Degrees    Right Wrist Flexion 16 Degrees    Right Wrist Radial Deviation 8 Degrees    Right Wrist Ulnar Deviation 10 Degrees      PROM   Overall PROM  Deficits;Due to pain    PROM Assessment Site Forearm;Wrist    Right/Left Forearm Right    Right Forearm Pronation 85 Degrees    Right Forearm Supination 80 Degrees    Right/Left Wrist Right    Right Wrist Extension 30 Degrees    Right Wrist Flexion 38 Degrees    Right Wrist Radial Deviation 12 Degrees    Right Wrist Ulnar Deviation 18 Degrees      Strength   Overall Strength Deficits;Due to pain    Strength Assessment Site Wrist;Hand    Right/Left Wrist Right;Left    Right/Left hand Right;Left    Right Hand Grip (lbs) 5 lbs    Left Hand Grip (lbs) 25 lb      Palpation   Palpation comment tenderness to palpation to right wrist extensor and flexors                      Objective measurements completed on examination: See above findings.               PT Education - 09/29/20 1023     Education Details putty squeeze, putty pinching, wrist flexion/extension AAROM, wriste extension stretch  Person(s) Educated Patient    Methods Explanation;Handout    Comprehension Verbalized understanding;Returned demonstration;Tactile cues required               PT Long Term Goals - 09/29/20 1202      PT LONG TERM GOAL #1   Title Patient will be independent with HEP    Time 6    Period Weeks    Status New      PT LONG TERM GOAL #2   Title Patient will demonstrate 15+lb of right grip strength to improve ability to perform home activities.    Time 6    Period Weeks    Status New      PT LONG TERM GOAL #3   Title Patinet will demonstrate 60+ degrees of right wrist extension to improve ability to perform functional tasks.    Time 6    Period Weeks    Status New      PT LONG TERM GOAL #4   Title Patient will report ability to perform ADLs and home activities independently with right wrist pain less than or equal to 3/10.    Time 6    Period Weeks    Status New                  Plan - 09/29/20 1211    Clinical Impression Statement Patient is an 79 year old female who presents to physical therapy with right wrist pain, decreased right wrist ROM, decreased right wrist MMT and localized edema secondary to a R distal radius fracture on 07/17/2020. Patient very tender to palpation to wrist musculature. Patient noted with decreased right grip strength with limited finger flexion. Patient and PT discussed plan of care and discussed HEP to which patient reported understanding. Patient would benefit from skilled physical therapy to address deficits and goals.    Personal Factors and Comorbidities Age;Comorbidity 2    Comorbidities HTN, PAC, Osteoporosis    Examination-Activity Limitations Bathing;Caring for Others;Carry;Dressing;Lift    Examination-Participation Restrictions Cleaning;Meal Prep    Stability/Clinical Decision Making Stable/Uncomplicated    Clinical Decision  Making Low    Rehab Potential Good    PT Frequency 2x / week    PT Duration 6 weeks    PT Treatment/Interventions ADLs/Self Care Home Management;Cryotherapy;Electrical Stimulation;Moist Heat;Ultrasound;Functional mobility training;Therapeutic activities;Therapeutic exercise;Neuromuscular re-education;Manual techniques;Passive range of motion;Patient/family education    PT Next Visit Plan UBE,  grip strengthening, wrist AROM, PROM to right wrist, modalities PRN for pain relief    PT Home Exercise Plan see patient education section    Consulted and Agree with Plan of Care Patient           Patient will benefit from skilled therapeutic intervention in order to improve the following deficits and impairments:  Decreased activity tolerance,Decreased strength,Decreased range of motion,Pain,Increased edema,Impaired UE functional use  Visit Diagnosis: Pain in right wrist - Plan: PT plan of care cert/re-cert  Stiffness of right wrist, not elsewhere classified - Plan: PT plan of care cert/re-cert  Muscle weakness (generalized) - Plan: PT plan of care cert/re-cert  Localized edema - Plan: PT plan of care cert/re-cert     Problem List Patient Active Problem List   Diagnosis Date Noted  . Tibial plateau fracture, right, closed, initial encounter 07/17/2020  . Prediabetes 02/25/2020  . Chronic kidney disease (CKD) stage G3b/A1, moderately decreased glomerular filtration rate (GFR) between 30-44 mL/min/1.73 square meter and albuminuria creatinine ratio less than 30 mg/g (HCC) 02/22/2018  . Atherosclerosis of aorta (  [place]) 02/21/2018  . PAC (premature atrial contraction) 12/30/2015  . Essential hypertension 12/18/2015  . Lower extremity edema 12/18/2015  . Palpitations 12/18/2015  . Hyperlipemia 02/14/2013  . Hypothyroid 02/14/2013  . Vitamin D deficiency 02/14/2013  . Generalized anxiety disorder 02/14/2013  . Constipation   . Esophageal stricture   . Osteoporosis with pathological fracture    . GERD (gastroesophageal reflux disease)     Gabriela Eves, PT, DPT 09/29/2020, 12:17 PM  [facility] [address] [place], [place], [address] Phone: [phone]   Fax:  [phone]  Name: MARIEANNE MARXEN MRN: [id number] Date of Birth: 05/27/1941

## 2020-10-01 ENCOUNTER — Ambulatory Visit: Payer: Medicare HMO | Admitting: Physical Therapy

## 2020-10-01 ENCOUNTER — Other Ambulatory Visit: Payer: Self-pay

## 2020-10-01 ENCOUNTER — Encounter: Payer: Self-pay | Admitting: Physical Therapy

## 2020-10-01 DIAGNOSIS — M6281 Muscle weakness (generalized): Secondary | ICD-10-CM | POA: Diagnosis not present

## 2020-10-01 DIAGNOSIS — R600 Localized edema: Secondary | ICD-10-CM

## 2020-10-01 DIAGNOSIS — M25531 Pain in right wrist: Secondary | ICD-10-CM

## 2020-10-01 DIAGNOSIS — M25631 Stiffness of right wrist, not elsewhere classified: Secondary | ICD-10-CM

## 2020-10-01 NOTE — Therapy (Signed)
[facility] [place], [place], [address] Phone: [phone]   Fax:  [phone]  Physical Therapy Treatment  Patient Details  Name: [name] MRN: [id number] Date of Birth: [birth year] Referring Provider (PT): Caryl Pina, MD   Encounter Date: 10/01/2020   PT End of Session - 10/01/20 0906    Visit Number 2    Number of Visits 12    Date for PT Re-Evaluation 11/10/20    Authorization Type [employer] (CQ modifier);  progress note every 10th visit    PT Start Time 0901    PT Stop Time 0945    PT Time Calculation (min) 44 min    Activity Tolerance Patient tolerated treatment well    Behavior During Therapy [facility] for tasks assessed/performed           Past Medical History:  Diagnosis Date  . Anxiety states   . Breast cyst   . Cancer ([place])    skin  . Cataract   . Constipation   . Esophageal stricture   . Essential hypertension 12/18/2015  . Fracture, rib   . GERD (gastroesophageal reflux disease)   . Hiatal hernia   . Lower extremity edema 12/18/2015  . Osteoporosis   . Other and unspecified hyperlipidemia   . PAC (premature atrial contraction) 12/30/2015  . Palpitations 12/18/2015  . Unspecified hypothyroidism   . Vertebral fracture, osteoporotic [facility])     Past Surgical History:  Procedure Laterality Date  . ABDOMINAL HYSTERECTOMY    . APPENDECTOMY    . BREAST BIOPSY     bil cysts  . CATARACT EXTRACTION    . ESOPHAGOGASTRODUODENOSCOPY N/A 01/19/2018   Procedure: ESOPHAGOGASTRODUODENOSCOPY (EGD);  Surgeon: Clarene Essex, MD;  Location: [facility];  Service: Endoscopy;  Laterality: N/A;  with flouro  . EYE SURGERY    . HAND SURGERY Left   . IR GENERIC HISTORICAL  12/10/2016   IR RADIOLOGIST EVAL & MGMT 12/10/2016 [facility]  . IR GENERIC HISTORICAL  12/23/2016   IR KYPHO THORACIC WITH BONE BIOPSY 12/23/2016 Luanne Bras, MD [facility]  . PARTIAL HYSTERECTOMY    . THYROID LOBECTOMY     right   . VERTEBROPLASTY      There were no vitals filed for this visit.   Subjective Assessment - 10/01/20 0905    Subjective COVID 19 screening performed on patient upon arrival. Patient reports less pain than it has been. Has been work on exercises.    Pertinent History HTN, PAC, Osteoporosis    Limitations Lifting;House hold activities    Diagnostic tests x-ray:  Stable distal radius fracture with slight angulation    Patient Stated Goals "use wrist and hand better"    Currently in Pain? Yes    Pain Score 3     Pain Location Wrist    Pain Orientation Right    Pain Descriptors / Indicators Discomfort    Pain Type Acute pain    Pain Onset More than a month ago    Pain Frequency Intermittent              [facility] PT Assessment - 10/01/20 0001      Assessment   Medical Diagnosis Closed fracture of right wrist with routine healing, subsequent encounter; decreased grip strength    Referring Provider (PT) Caryl Pina, MD    Onset Date/Surgical Date 07/17/20    Hand Dominance Left    Next MD Visit "3 months"    Prior Therapy no  Precautions   Precautions Fall      Restrictions   Weight Bearing Restrictions No                         [facility] Adult PT Treatment/Exercise - 10/01/20 0001      Exercises   Exercises Wrist;Hand      Hand Exercises   Other Hand Exercises R hand gripping yellow putty      Wrist Exercises   Wrist Flexion AROM;Right;20 reps;Seated    Wrist Extension AROM;Right;20 reps;Seated    Wrist Radial Deviation AROM;Right;20 reps;Seated    Wrist Ulnar Deviation AROM;Right;20 reps;Seated    Other wrist exercises R supination/pronation x20 reps    Other wrist exercises Wringing with PVC for wrist ext/flex      Manual Therapy   Manual Therapy Soft tissue mobilization    Soft tissue mobilization STW to R wrist extensors, flexors, supinators, pronators to reduce tone and improve ROM                       PT Long Term Goals -  09/29/20 1202      PT LONG TERM GOAL #1   Title Patient will be independent with HEP    Time 6    Period Weeks    Status New      PT LONG TERM GOAL #2   Title Patient will demonstrate 15+lb of right grip strength to improve ability to perform home activities.    Time 6    Period Weeks    Status New      PT LONG TERM GOAL #3   Title Patinet will demonstrate 60+ degrees of right wrist extension to improve ability to perform functional tasks.    Time 6    Period Weeks    Status New      PT LONG TERM GOAL #4   Title Patient will report ability to perform ADLs and home activities independently with right wrist pain less than or equal to 3/10.    Time 6    Period Weeks    Status New                 Plan - 10/01/20 1001    Clinical Impression Statement Patient presented in clinic with reports of continually working on ROM and stretching of R wrist. Patient more limited with wrist flexion than wrist extension. Patient noted more tenderness to palpation of distal R wrist flexors along carpal region. Patient provided print out of HEP and provided demo cueing to enforce proper technique.    Personal Factors and Comorbidities Age;Comorbidity 2    Comorbidities HTN, PAC, Osteoporosis    Examination-Activity Limitations Bathing;Caring for Others;Carry;Dressing;Lift    Examination-Participation Restrictions Cleaning;Meal Prep    Stability/Clinical Decision Making Stable/Uncomplicated    Rehab Potential Good    PT Frequency 2x / week    PT Duration 6 weeks    PT Treatment/Interventions ADLs/Self Care Home Management;Cryotherapy;Electrical Stimulation;Moist Heat;Ultrasound;Functional mobility training;Therapeutic activities;Therapeutic exercise;Neuromuscular re-education;Manual techniques;Passive range of motion;Patient/family education    PT Next Visit Plan UBE,  grip strengthening, wrist AROM, PROM to right wrist, modalities PRN for pain relief    PT Home Exercise Plan see patient  education section    Consulted and Agree with Plan of Care Patient           Patient will benefit from skilled therapeutic intervention in order to improve the following deficits and impairments:  Decreased activity  tolerance,Decreased strength,Decreased range of motion,Pain,Increased edema,Impaired UE functional use  Visit Diagnosis: Pain in right wrist  Stiffness of right wrist, not elsewhere classified  Muscle weakness (generalized)  Localized edema     Problem List Patient Active Problem List   Diagnosis Date Noted  . Tibial plateau fracture, right, closed, initial encounter 07/17/2020  . Prediabetes 02/25/2020  . Chronic kidney disease (CKD) stage G3b/A1, moderately decreased glomerular filtration rate (GFR) between 30-44 mL/min/1.73 square meter and albuminuria creatinine ratio less than 30 mg/g (HCC) 02/22/2018  . Atherosclerosis of aorta ([place]) 02/21/2018  . PAC (premature atrial contraction) 12/30/2015  . Essential hypertension 12/18/2015  . Lower extremity edema 12/18/2015  . Palpitations 12/18/2015  . Hyperlipemia 02/14/2013  . Hypothyroid 02/14/2013  . Vitamin D deficiency 02/14/2013  . Generalized anxiety disorder 02/14/2013  . Constipation   . Esophageal stricture   . Osteoporosis with pathological fracture   . GERD (gastroesophageal reflux disease)     Standley Brooking, PTA 10/01/2020, 10:04 AM  [facility] [place], [place], [address] Phone: [phone]   Fax:  [phone]  Name: [name] MRN: [id number] Date of Birth: 1941-09-30

## 2020-10-03 ENCOUNTER — Other Ambulatory Visit: Payer: Self-pay | Admitting: Family Medicine

## 2020-10-06 ENCOUNTER — Other Ambulatory Visit: Payer: Self-pay

## 2020-10-06 ENCOUNTER — Encounter: Payer: Self-pay | Admitting: Physical Therapy

## 2020-10-06 ENCOUNTER — Ambulatory Visit: Payer: Medicare HMO | Admitting: Physical Therapy

## 2020-10-06 DIAGNOSIS — M25531 Pain in right wrist: Secondary | ICD-10-CM

## 2020-10-06 DIAGNOSIS — M25631 Stiffness of right wrist, not elsewhere classified: Secondary | ICD-10-CM

## 2020-10-06 DIAGNOSIS — M6281 Muscle weakness (generalized): Secondary | ICD-10-CM | POA: Diagnosis not present

## 2020-10-06 DIAGNOSIS — R600 Localized edema: Secondary | ICD-10-CM | POA: Diagnosis not present

## 2020-10-06 NOTE — Therapy (Signed)
[facility] [place], [place], [address] Phone: [phone]   Fax:  [phone]  Physical Therapy Treatment  Patient Details  Name: [name] MRN: [id number] Date of Birth: 11/23/1940 Referring Provider (PT): Caryl Pina, MD   Encounter Date: 10/06/2020   PT End of Session - 10/06/20 1119    Visit Number 3    Number of Visits 12    Date for PT Re-Evaluation 11/10/20    Authorization Type [employer] Medicare (CQ modifier);  progress note every 10th visit    PT Start Time 1119    PT Stop Time 1200    PT Time Calculation (min) 41 min    Activity Tolerance Patient tolerated treatment well    Behavior During Therapy [place] for tasks assessed/performed           Past Medical History:  Diagnosis Date  . Anxiety states   . Breast cyst   . Cancer ([place])    skin  . Cataract   . Constipation   . Esophageal stricture   . Essential hypertension 12/18/2015  . Fracture, rib   . GERD (gastroesophageal reflux disease)   . Hiatal hernia   . Lower extremity edema 12/18/2015  . Osteoporosis   . Other and unspecified hyperlipidemia   . PAC (premature atrial contraction) 12/30/2015  . Palpitations 12/18/2015  . Unspecified hypothyroidism   . Vertebral fracture, osteoporotic [facility])     Past Surgical History:  Procedure Laterality Date  . ABDOMINAL HYSTERECTOMY    . APPENDECTOMY    . BREAST BIOPSY     bil cysts  . CATARACT EXTRACTION    . ESOPHAGOGASTRODUODENOSCOPY N/A 01/19/2018   Procedure: ESOPHAGOGASTRODUODENOSCOPY (EGD);  Surgeon: Clarene Essex, MD;  Location: [facility];  Service: Endoscopy;  Laterality: N/A;  with flouro  . EYE SURGERY    . HAND SURGERY Left   . IR GENERIC HISTORICAL  12/10/2016   IR RADIOLOGIST EVAL & MGMT 12/10/2016 [facility]  . IR GENERIC HISTORICAL  12/23/2016   IR KYPHO THORACIC WITH BONE BIOPSY 12/23/2016 Luanne Bras, MD [facility]  . PARTIAL HYSTERECTOMY    . THYROID LOBECTOMY     right   . VERTEBROPLASTY      There were no vitals filed for this visit.   Subjective Assessment - 10/06/20 1118    Subjective COVID 19 screening performed on patient upon arrival. Patient reports less pain than it has been. Has been work on exercises.    Pertinent History HTN, PAC, Osteoporosis    Limitations Lifting;House hold activities    Diagnostic tests x-ray:  Stable distal radius fracture with slight angulation    Patient Stated Goals "use wrist and hand better"    Currently in Pain? Yes    Pain Score 2     Pain Location Wrist    Pain Orientation Right    Pain Descriptors / Indicators Discomfort;Other (Comment)   stiffness   Pain Type Acute pain    Pain Onset More than a month ago    Pain Frequency Intermittent              [facility] PT Assessment - 10/06/20 0001      Assessment   Medical Diagnosis Closed fracture of right wrist with routine healing, subsequent encounter; decreased grip strength    Referring Provider (PT) Caryl Pina, MD    Onset Date/Surgical Date 07/17/20    Hand Dominance Left    Next MD Visit "3 months"    Prior Therapy  no      Precautions   Precautions Fall      Restrictions   Weight Bearing Restrictions No                         [facility] Adult PT Treatment/Exercise - 10/06/20 0001      Hand Exercises   Other Hand Exercises R hand gripping, pinching yellow putty    Other Hand Exercises R gripping with red web x5 min      Wrist Exercises   Wrist Flexion AROM;Right;20 reps;Seated    Wrist Extension AROM;Right;20 reps;Seated    Other wrist exercises R supination/pronation x20 reps    Other wrist exercises Wringing with PVC for wrist ext/flex      Manual Therapy   Manual Therapy Soft tissue mobilization    Soft tissue mobilization STW to R wrist extensors, flexors, supinators, pronators to reduce tone and improve ROM                       PT Long Term Goals - 10/06/20 1213      PT LONG TERM GOAL #1   Title  Patient will be independent with HEP    Time 6    Period Weeks    Status Achieved      PT LONG TERM GOAL #2   Title Patient will demonstrate 15+lb of right grip strength to improve ability to perform home activities.    Time 6    Period Weeks    Status On-going      PT LONG TERM GOAL #3   Title Patinet will demonstrate 60+ degrees of right wrist extension to improve ability to perform functional tasks.    Time 6    Period Weeks    Status On-going      PT LONG TERM GOAL #4   Title Patient will report ability to perform ADLs and home activities independently with right wrist pain less than or equal to 3/10.    Time 6    Period Weeks    Status On-going                 Plan - 10/06/20 1208    Clinical Impression Statement Patient presented in clinic with reports of minimal R wrist discomfort. Patient's chief complaint is of stiffness in the mornings. Patient reports compliance with HEP at home. Patient educated to allow forearm to rest on arm of chair while completing wrist AROM. Grip, pinch and wrist flexion still limited at this time. Mod tightness palpable in both R wrist flexors and extensors. No soreness reported during manual therapy session.    Personal Factors and Comorbidities Age;Comorbidity 2    Comorbidities HTN, PAC, Osteoporosis    Examination-Activity Limitations Bathing;Caring for Others;Carry;Dressing;Lift    Examination-Participation Restrictions Cleaning;Meal Prep    Stability/Clinical Decision Making Stable/Uncomplicated    Rehab Potential Good    PT Frequency 2x / week    PT Duration 6 weeks    PT Treatment/Interventions ADLs/Self Care Home Management;Cryotherapy;Electrical Stimulation;Moist Heat;Ultrasound;Functional mobility training;Therapeutic activities;Therapeutic exercise;Neuromuscular re-education;Manual techniques;Passive range of motion;Patient/family education    PT Next Visit Plan UBE,  grip strengthening, wrist AROM, PROM to right wrist,  modalities PRN for pain relief    PT Home Exercise Plan see patient education section    Consulted and Agree with Plan of Care Patient           Patient will benefit from skilled therapeutic intervention in order  to improve the following deficits and impairments:  Decreased activity tolerance,Decreased strength,Decreased range of motion,Pain,Increased edema,Impaired UE functional use  Visit Diagnosis: Pain in right wrist  Stiffness of right wrist, not elsewhere classified  Muscle weakness (generalized)  Localized edema     Problem List Patient Active Problem List   Diagnosis Date Noted  . Tibial plateau fracture, right, closed, initial encounter 07/17/2020  . Prediabetes 02/25/2020  . Chronic kidney disease (CKD) stage G3b/A1, moderately decreased glomerular filtration rate (GFR) between 30-44 mL/min/1.73 square meter and albuminuria creatinine ratio less than 30 mg/g (HCC) 02/22/2018  . Atherosclerosis of aorta ([place]) 02/21/2018  . PAC (premature atrial contraction) 12/30/2015  . Essential hypertension 12/18/2015  . Lower extremity edema 12/18/2015  . Palpitations 12/18/2015  . Hyperlipemia 02/14/2013  . Hypothyroid 02/14/2013  . Vitamin D deficiency 02/14/2013  . Generalized anxiety disorder 02/14/2013  . Constipation   . Esophageal stricture   . Osteoporosis with pathological fracture   . GERD (gastroesophageal reflux disease)     Standley Brooking, PTA 10/06/2020, 12:13 PM  [facility] [address] [place], [place], [address] Phone: [phone]   Fax:  [phone]  Name: [name] MRN: [id number] Date of Birth: 1941-08-06

## 2020-10-08 ENCOUNTER — Ambulatory Visit: Payer: Medicare HMO | Admitting: Physical Therapy

## 2020-10-08 ENCOUNTER — Encounter: Payer: Self-pay | Admitting: Physical Therapy

## 2020-10-08 ENCOUNTER — Other Ambulatory Visit: Payer: Self-pay

## 2020-10-08 DIAGNOSIS — R600 Localized edema: Secondary | ICD-10-CM

## 2020-10-08 DIAGNOSIS — M25531 Pain in right wrist: Secondary | ICD-10-CM | POA: Diagnosis not present

## 2020-10-08 DIAGNOSIS — M6281 Muscle weakness (generalized): Secondary | ICD-10-CM

## 2020-10-08 DIAGNOSIS — M25631 Stiffness of right wrist, not elsewhere classified: Secondary | ICD-10-CM | POA: Diagnosis not present

## 2020-10-08 NOTE — Therapy (Signed)
[facility] [place], [place], [address] Phone: [phone]   Fax:  [phone]  Physical Therapy Treatment  Patient Details  Name: [name] MRN: [id number] Date of Birth: [birth year] Referring Provider (PT): Caryl Pina, MD   Encounter Date: 10/08/2020   PT End of Session - 10/08/20 1211    Visit Number 4    Number of Visits 12    Date for PT Re-Evaluation 11/10/20    Authorization Type [employer] (CQ modifier);  progress note every 10th visit    PT Start Time 1118    PT Stop Time 1158    PT Time Calculation (min) 40 min    Activity Tolerance Patient tolerated treatment well    Behavior During Therapy [facility] for tasks assessed/performed           Past Medical History:  Diagnosis Date  . Anxiety states   . Breast cyst   . Cancer ([place])    skin  . Cataract   . Constipation   . Esophageal stricture   . Essential hypertension 12/18/2015  . Fracture, rib   . GERD (gastroesophageal reflux disease)   . Hiatal hernia   . Lower extremity edema 12/18/2015  . Osteoporosis   . Other and unspecified hyperlipidemia   . PAC (premature atrial contraction) 12/30/2015  . Palpitations 12/18/2015  . Unspecified hypothyroidism   . Vertebral fracture, osteoporotic [facility])     Past Surgical History:  Procedure Laterality Date  . ABDOMINAL HYSTERECTOMY    . APPENDECTOMY    . BREAST BIOPSY     bil cysts  . CATARACT EXTRACTION    . ESOPHAGOGASTRODUODENOSCOPY N/A 01/19/2018   Procedure: ESOPHAGOGASTRODUODENOSCOPY (EGD);  Surgeon: Clarene Essex, MD;  Location: [facility];  Service: Endoscopy;  Laterality: N/A;  with flouro  . EYE SURGERY    . HAND SURGERY Left   . IR GENERIC HISTORICAL  12/10/2016   IR RADIOLOGIST EVAL & MGMT 12/10/2016 [facility]  . IR GENERIC HISTORICAL  12/23/2016   IR KYPHO THORACIC WITH BONE BIOPSY 12/23/2016 Luanne Bras, MD [facility]  . PARTIAL HYSTERECTOMY    . THYROID LOBECTOMY     right   . VERTEBROPLASTY      There were no vitals filed for this visit.   Subjective Assessment - 10/08/20 1118    Subjective COVID 19 screening performed on patient upon arrival. Patient reports less pain than it has been. Has been work on exercises.    Pertinent History HTN, PAC, Osteoporosis    Limitations Lifting;House hold activities    Diagnostic tests x-ray:  Stable distal radius fracture with slight angulation    Patient Stated Goals "use wrist and hand better"    Currently in Pain? Yes    Pain Score 2     Pain Location Wrist    Pain Orientation Right    Pain Descriptors / Indicators Other (Comment)   stiffness   Pain Type Acute pain    Pain Onset More than a month ago    Pain Frequency Intermittent              [facility] PT Assessment - 10/08/20 0001      Assessment   Medical Diagnosis Closed fracture of right wrist with routine healing, subsequent encounter; decreased grip strength    Referring Provider (PT) Caryl Pina, MD    Onset Date/Surgical Date 07/17/20    Hand Dominance Left    Next MD Visit "3 months"    Prior Therapy  no      Precautions   Precautions Fall      Restrictions   Weight Bearing Restrictions No                         [facility] Adult PT Treatment/Exercise - 10/08/20 0001      Exercises   Exercises Wrist;Hand      Hand Exercises   Opposition AROM;Right;5 reps;Seated    Other Hand Exercises R digit abduction x10 reps    Other Hand Exercises R pinch gripping towel, prayer stretch x10 reps 10 sec holds, ball gripping      Wrist Exercises   Wrist Flexion AROM;Right;20 reps;Seated    Wrist Extension AROM;Right;20 reps;Seated    Other wrist exercises R supination/pronation x20 reps    Other wrist exercises Wringing with towel for wrist ext/flex      Manual Therapy   Manual Therapy Soft tissue mobilization;Passive ROM    Soft tissue mobilization STW to R wrist extensors, flexors, supinators, pronators to reduce tone and improve  ROM    Passive ROM PROM stretching into wrist flexion and extension                       PT Long Term Goals - 10/06/20 1213      PT LONG TERM GOAL #1   Title Patient will be independent with HEP    Time 6    Period Weeks    Status Achieved      PT LONG TERM GOAL #2   Title Patient will demonstrate 15+lb of right grip strength to improve ability to perform home activities.    Time 6    Period Weeks    Status On-going      PT LONG TERM GOAL #3   Title Patinet will demonstrate 60+ degrees of right wrist extension to improve ability to perform functional tasks.    Time 6    Period Weeks    Status On-going      PT LONG TERM GOAL #4   Title Patient will report ability to perform ADLs and home activities independently with right wrist pain less than or equal to 3/10.    Time 6    Period Weeks    Status On-going                 Plan - 10/08/20 1214    Clinical Impression Statement Patient presented in clinic with reports of continued stiffness of R wrist and hand. Patient reports compliance with HEP. Patient able to complete therex with greater tactile blocking for wrist AROM. Patient also displayed limitation with finger flexion and gripping. Mod palpable tightness of R forearm and wrist still notable but following end of treatment she reported less stiffness and tightness.    Personal Factors and Comorbidities Age;Comorbidity 2    Comorbidities HTN, PAC, Osteoporosis    Examination-Activity Limitations Bathing;Caring for Others;Carry;Dressing;Lift    Examination-Participation Restrictions Cleaning;Meal Prep    Stability/Clinical Decision Making Stable/Uncomplicated    Rehab Potential Good    PT Frequency 2x / week    PT Duration 6 weeks    PT Treatment/Interventions ADLs/Self Care Home Management;Cryotherapy;Electrical Stimulation;Moist Heat;Ultrasound;Functional mobility training;Therapeutic activities;Therapeutic exercise;Neuromuscular re-education;Manual  techniques;Passive range of motion;Patient/family education    PT Next Visit Plan UBE,  grip strengthening, wrist AROM, PROM to right wrist, modalities PRN for pain relief    PT Home Exercise Plan see patient education section    Consulted  and Agree with Plan of Care Patient           Patient will benefit from skilled therapeutic intervention in order to improve the following deficits and impairments:  Decreased activity tolerance,Decreased strength,Decreased range of motion,Pain,Increased edema,Impaired UE functional use  Visit Diagnosis: Pain in right wrist  Stiffness of right wrist, not elsewhere classified  Muscle weakness (generalized)  Localized edema     Problem List Patient Active Problem List   Diagnosis Date Noted  . Tibial plateau fracture, right, closed, initial encounter 07/17/2020  . Prediabetes 02/25/2020  . Chronic kidney disease (CKD) stage G3b/A1, moderately decreased glomerular filtration rate (GFR) between 30-44 mL/min/1.73 square meter and albuminuria creatinine ratio less than 30 mg/g (HCC) 02/22/2018  . Atherosclerosis of aorta ([place]) 02/21/2018  . PAC (premature atrial contraction) 12/30/2015  . Essential hypertension 12/18/2015  . Lower extremity edema 12/18/2015  . Palpitations 12/18/2015  . Hyperlipemia 02/14/2013  . Hypothyroid 02/14/2013  . Vitamin D deficiency 02/14/2013  . Generalized anxiety disorder 02/14/2013  . Constipation   . Esophageal stricture   . Osteoporosis with pathological fracture   . GERD (gastroesophageal reflux disease)     Standley Brooking, PTA 10/08/2020, 12:21 PM  [facility] [address] [place], [place], [address] Phone: [phone]   Fax:  [phone]  Name: LENESHA WAXMAN MRN: [id number] Date of Birth: [birth year]

## 2020-10-14 ENCOUNTER — Ambulatory Visit: Payer: Medicare HMO | Admitting: Physical Therapy

## 2020-10-14 ENCOUNTER — Other Ambulatory Visit: Payer: Self-pay

## 2020-10-14 DIAGNOSIS — M6281 Muscle weakness (generalized): Secondary | ICD-10-CM | POA: Diagnosis not present

## 2020-10-14 DIAGNOSIS — M25631 Stiffness of right wrist, not elsewhere classified: Secondary | ICD-10-CM

## 2020-10-14 DIAGNOSIS — M25531 Pain in right wrist: Secondary | ICD-10-CM

## 2020-10-14 DIAGNOSIS — R600 Localized edema: Secondary | ICD-10-CM | POA: Diagnosis not present

## 2020-10-14 NOTE — Therapy (Addendum)
[facility] [address] [place], [place], [address] Phone: [phone]   Fax:  [phone]  Physical Therapy Treatment  Patient Details  Name: [name] MRN: [id number] Date of Birth: 1940-11-09 Referring Provider (PT): Arville Care, MD   Encounter Date: 10/14/2020   PT End of Session - 10/14/20 1250    Visit Number 5    Number of Visits 12    Date for PT Re-Evaluation 11/10/20    Authorization Type [employer] Medicare (CQ modifier);  progress note every 10th visit    PT Start Time 1115    PT Stop Time 1201    PT Time Calculation (min) 46 min    Activity Tolerance Patient tolerated treatment well    Behavior During Therapy [facility] for tasks assessed/performed           Past Medical History:  Diagnosis Date  . Anxiety states   . Breast cyst   . Cancer ([facility])    skin  . Cataract   . Constipation   . Esophageal stricture   . Essential hypertension 12/18/2015  . Fracture, rib   . GERD (gastroesophageal reflux disease)   . Hiatal hernia   . Lower extremity edema 12/18/2015  . Osteoporosis   . Other and unspecified hyperlipidemia   . PAC (premature atrial contraction) 12/30/2015  . Palpitations 12/18/2015  . Unspecified hypothyroidism   . Vertebral fracture, osteoporotic [facility])     Past Surgical History:  Procedure Laterality Date  . ABDOMINAL HYSTERECTOMY    . APPENDECTOMY    . BREAST BIOPSY     bil cysts  . CATARACT EXTRACTION    . ESOPHAGOGASTRODUODENOSCOPY N/A 01/19/2018   Procedure: ESOPHAGOGASTRODUODENOSCOPY (EGD);  Surgeon: Vida Rigger, MD;  Location: [facility];  Service: Endoscopy;  Laterality: N/A;  with flouro  . EYE SURGERY    . HAND SURGERY Left   . IR GENERIC HISTORICAL  12/10/2016   IR RADIOLOGIST EVAL & MGMT 12/10/2016 [facility]  . IR GENERIC HISTORICAL  12/23/2016   IR KYPHO THORACIC WITH BONE BIOPSY 12/23/2016 Julieanne Cotton, MD [facility]  . PARTIAL HYSTERECTOMY    . THYROID LOBECTOMY     right   . VERTEBROPLASTY      There were no vitals filed for this visit.   Subjective Assessment - 10/14/20 1250    Subjective COVID-19 screen performed prior to patient entering clinic.  Doing my home exercises.  May make next treatment my last.    Pertinent History HTN, PAC, Osteoporosis    Limitations Lifting;House hold activities    Diagnostic tests x-ray:  Stable distal radius fracture with slight angulation    Patient Stated Goals "use wrist and hand better"    Currently in Pain? Yes    Pain Score 2     Pain Location Wrist    Pain Orientation Right    Pain Descriptors / Indicators --   Stiff.   Pain Onset More than a month ago                             [facility] Adult PT Treatment/Exercise - 10/14/20 0001      Modalities   Modalities Moist Heat;Electrical Stimulation      Moist Heat Therapy   Number Minutes Moist Heat 15 Minutes    Moist Heat Location --   Left wrist.     Electrical Stimulation   Electrical Stimulation Location Disal left affected wrist region.  Electrical Stimulation Action Pre-mod.    Electrical Stimulation Parameters 80-150 Hz x 15 minutes.    Electrical Stimulation Goals Pain      Manual Therapy   Manual Therapy Passive ROM    Passive ROM STW/M and passive stretching of patient's right wrist and fingers with sustained gentle low load long duration stretching technique utilized x 23 minutes.         Right wrist.              PT Long Term Goals - 10/06/20 1213      PT LONG TERM GOAL #1   Title Patient will be independent with HEP    Time 6    Period Weeks    Status Achieved      PT LONG TERM GOAL #2   Title Patient will demonstrate 15+lb of right grip strength to improve ability to perform home activities.    Time 6    Period Weeks    Status On-going      PT LONG TERM GOAL #3   Title Patinet will demonstrate 60+ degrees of right wrist extension to improve ability to perform functional tasks.    Time 6     Period Weeks    Status On-going      PT LONG TERM GOAL #4   Title Patient will report ability to perform ADLs and home activities independently with right wrist pain less than or equal to 3/10.    Time 6    Period Weeks    Status On-going                 Plan - 10/14/20 1255    Clinical Impression Statement Patient did very well with treatment today and states her wrist felt good after tretament.  She continues to lack range of motion in her left wrist and fingers though she is able to make a very functional fist.  She is pleased with her progress and may make her next visit her last.    Personal Factors and Comorbidities Age;Comorbidity 2    Comorbidities HTN, PAC, Osteoporosis    Examination-Activity Limitations Bathing;Caring for Others;Carry;Dressing;Lift    Examination-Participation Restrictions Cleaning;Meal Prep    Stability/Clinical Decision Making Stable/Uncomplicated    Rehab Potential Good    PT Frequency 2x / week    PT Duration 6 weeks    PT Treatment/Interventions ADLs/Self Care Home Management;Cryotherapy;Electrical Stimulation;Moist Heat;Ultrasound;Functional mobility training;Therapeutic activities;Therapeutic exercise;Neuromuscular re-education;Manual techniques;Passive range of motion;Patient/family education    PT Next Visit Plan UBE,  grip strengthening, wrist AROM, PROM to right wrist, modalities PRN for pain relief    PT Home Exercise Plan see patient education section    Consulted and Agree with Plan of Care Patient           Patient will benefit from skilled therapeutic intervention in order to improve the following deficits and impairments:  Decreased activity tolerance,Decreased strength,Decreased range of motion,Pain,Increased edema,Impaired UE functional use  Visit Diagnosis: Pain in right wrist  Stiffness of right wrist, not elsewhere classified  Muscle weakness (generalized)  Localized edema     Problem List Patient Active Problem  List   Diagnosis Date Noted  . Tibial plateau fracture, right, closed, initial encounter 07/17/2020  . Prediabetes 02/25/2020  . Chronic kidney disease (CKD) stage G3b/A1, moderately decreased glomerular filtration rate (GFR) between 30-44 mL/min/1.73 square meter and albuminuria creatinine ratio less than 30 mg/g (HCC) 02/22/2018  . Atherosclerosis of aorta ([place]) 02/21/2018  . PAC (premature atrial contraction)  12/30/2015  . Essential hypertension 12/18/2015  . Lower extremity edema 12/18/2015  . Palpitations 12/18/2015  . Hyperlipemia 02/14/2013  . Hypothyroid 02/14/2013  . Vitamin D deficiency 02/14/2013  . Generalized anxiety disorder 02/14/2013  . Constipation   . Esophageal stricture   . Osteoporosis with pathological fracture   . GERD (gastroesophageal reflux disease)     Timmie Calix, [place] MPT 10/14/2020, 12:58 PM  [facility] [address] [place], [place], [address] Phone: [phone]   Fax:  [phone]  Name: WANDALENE SUFFERN MRN: [id number] Date of Birth: Mar 24, 1941

## 2020-10-16 ENCOUNTER — Ambulatory Visit: Payer: Medicare HMO | Admitting: Physical Therapy

## 2020-10-16 ENCOUNTER — Encounter: Payer: Self-pay | Admitting: Physical Therapy

## 2020-10-16 ENCOUNTER — Other Ambulatory Visit: Payer: Self-pay

## 2020-10-16 DIAGNOSIS — M6281 Muscle weakness (generalized): Secondary | ICD-10-CM

## 2020-10-16 DIAGNOSIS — M25531 Pain in right wrist: Secondary | ICD-10-CM

## 2020-10-16 DIAGNOSIS — M25631 Stiffness of right wrist, not elsewhere classified: Secondary | ICD-10-CM

## 2020-10-16 DIAGNOSIS — R600 Localized edema: Secondary | ICD-10-CM | POA: Diagnosis not present

## 2020-10-16 NOTE — Therapy (Addendum)
[facility] [place], [place], [address] Phone: [phone]   Fax:  [phone]  Physical Therapy Treatment  Patient Details  Name: [name] MRN: [id number] Date of Birth: [birth year] Referring Provider (PT): Caryl Pina, MD   Encounter Date: 10/16/2020   PT End of Session - 10/16/20 1226     Visit Number 6    Number of Visits 12    Date for PT Re-Evaluation 11/10/20    Authorization Type [employer] Medicare (CQ modifier);  progress note every 10th visit    PT Start Time 1115    PT Stop Time 1202    PT Time Calculation (min) 47 min    Activity Tolerance Patient tolerated treatment well    Behavior During Therapy [facility] for tasks assessed/performed             Past Medical History:  Diagnosis Date   Anxiety states    Breast cyst    Cancer ([place])    skin   Cataract    Constipation    Esophageal stricture    Essential hypertension 12/18/2015   Fracture, rib    GERD (gastroesophageal reflux disease)    Hiatal hernia    Lower extremity edema 12/18/2015   Osteoporosis    Other and unspecified hyperlipidemia    PAC (premature atrial contraction) 12/30/2015   Palpitations 12/18/2015   Unspecified hypothyroidism    Vertebral fracture, osteoporotic ([place])     Past Surgical History:  Procedure Laterality Date   ABDOMINAL HYSTERECTOMY     APPENDECTOMY     BREAST BIOPSY     bil cysts   CATARACT EXTRACTION     ESOPHAGOGASTRODUODENOSCOPY N/A 01/19/2018   Procedure: ESOPHAGOGASTRODUODENOSCOPY (EGD);  Surgeon: Clarene Essex, MD;  Location: [facility];  Service: Endoscopy;  Laterality: N/A;  with flouro   EYE SURGERY     HAND SURGERY Left    IR GENERIC HISTORICAL  12/10/2016   IR RADIOLOGIST EVAL & MGMT 12/10/2016 [facility]   IR GENERIC HISTORICAL  12/23/2016   IR KYPHO THORACIC WITH BONE BIOPSY 12/23/2016 Luanne Bras, MD [facility]   PARTIAL HYSTERECTOMY     THYROID LOBECTOMY     right   VERTEBROPLASTY       There were no vitals filed for this visit.   Subjective Assessment - 10/16/20 1150     Subjective COVID-19 screen performed prior to patient entering clinic.  Doing good.    Pertinent History HTN, PAC, Osteoporosis    Limitations Lifting;House hold activities    Currently in Pain? Yes    Pain Score 2     Pain Location Wrist    Pain Orientation Right    Pain Descriptors / Indicators --   Stiff.   Pain Onset More than a month ago                               [facility] Adult PT Treatment/Exercise - 10/16/20 0001       Moist Heat Therapy   Number Minutes Moist Heat 15 Minutes    Moist Heat Location --   RT wrist.     Manual Therapy   Manual Therapy Passive ROM    Passive ROM STW/M and PROM to patient's x 23 minutes into right finer flexion, wrist flexion and extension.  PT Long Term Goals - 10/16/20 1225       PT LONG TERM GOAL #1   Title Patient will be independent with HEP    Time 6    Period Weeks    Status Achieved      PT LONG TERM GOAL #2   Title Patient will demonstrate 15+lb of right grip strength to improve ability to perform home activities.    Baseline 10# (09/3020).    Time 6    Period Weeks    Status On-going      PT LONG TERM GOAL #3   Baseline 39 degrees extension and flexion is 20 degrees (10/16/20).    Time 6    Period Weeks    Status On-going      PT LONG TERM GOAL #4   Title Patient will report ability to perform ADLs and home activities independently with right wrist pain less than or equal to 3/10.    Time 6    Period Weeks    Status Achieved                   Plan - 10/16/20 1227     Clinical Impression Statement The patient is very pleased with her progress.  She wants to continue with her HEP.  She will be placed on hold at this time and call clinic shoulder she feels she needs further treatment.    Personal Factors and Comorbidities Age;Comorbidity 2     Comorbidities HTN, PAC, Osteoporosis    Examination-Activity Limitations Bathing;Caring for Others;Carry;Dressing;Lift    Examination-Participation Restrictions Cleaning;Meal Prep    PT Next Visit Plan UBE,  grip strengthening, wrist AROM, PROM to right wrist, modalities PRN for pain relief    PT Home Exercise Plan see patient education section    Consulted and Agree with Plan of Care Patient             Patient will benefit from skilled therapeutic intervention in order to improve the following deficits and impairments:  Decreased activity tolerance,Decreased strength,Decreased range of motion,Pain,Increased edema,Impaired UE functional use  Visit Diagnosis: Pain in right wrist  Stiffness of right wrist, not elsewhere classified  Muscle weakness (generalized)  Localized edema     Problem List Patient Active Problem List   Diagnosis Date Noted   Tibial plateau fracture, right, closed, initial encounter 07/17/2020   Prediabetes 02/25/2020   Chronic kidney disease (CKD) stage G3b/A1, moderately decreased glomerular filtration rate (GFR) between 30-44 mL/min/1.73 square meter and albuminuria creatinine ratio less than 30 mg/g (HCC) 02/22/2018   Atherosclerosis of aorta ([place]) 02/21/2018   PAC (premature atrial contraction) 12/30/2015   Essential hypertension 12/18/2015   Lower extremity edema 12/18/2015   Palpitations 12/18/2015   Hyperlipemia 02/14/2013   Hypothyroid 02/14/2013   Vitamin D deficiency 02/14/2013   Generalized anxiety disorder 02/14/2013   Constipation    Esophageal stricture    Osteoporosis with pathological fracture    GERD (gastroesophageal reflux disease)     Hrishikesh Hoeg, [place] MPT 10/16/2020, 12:29 PM  [facility] [address] [place], [place], [address] Phone: [phone]   Fax:  [phone]  Name: [name] MRN: [id number] Date of Birth: Aug 01, 1941 PHYSICAL THERAPY DISCHARGE SUMMARY  Visits  from Start of Care: 6.  Current functional level related to goals / functional outcomes: See above.   Remaining deficits: See goal section.   Education / Equipment: HEP.   Patient agrees to discharge. Patient goals were partially met. Patient is  being discharged due to being pleased with the current functional level.    Mali Natahlia Hoggard MPT

## 2020-10-24 ENCOUNTER — Telehealth: Payer: Self-pay | Admitting: Orthopedic Surgery

## 2020-10-24 NOTE — Telephone Encounter (Signed)
I called and spoke with the spouse and let him know that I do not see a note about making a new brace from the doctor or physical therapist. No other concerns.

## 2020-10-24 NOTE — Telephone Encounter (Signed)
Call received per voice message, regarding brace question; call returned. Spoke with patient's spouse, designated contact - relays that patient received a call from someone about a brace, and is concerned that it may not be a legitimate call. Please advise. Home ph# [phone] * * Note: patient elected to cancel her 09/19/20 appointment with Dr Amedeo Kinsman, as said was doing better; I offered again to reschedule, however, said she has been doing physical therapy, per notes in chart, and is doing well.

## 2020-11-12 ENCOUNTER — Other Ambulatory Visit: Payer: Self-pay

## 2020-11-12 ENCOUNTER — Other Ambulatory Visit: Payer: Self-pay | Admitting: Family Medicine

## 2020-11-12 ENCOUNTER — Ambulatory Visit
Admission: RE | Admit: 2020-11-12 | Discharge: 2020-11-12 | Disposition: A | Discharge status: Home / Self Care | Payer: Medicare HMO | Source: Ambulatory Visit | Attending: Family Medicine | Admitting: Family Medicine

## 2020-11-12 DIAGNOSIS — Z1231 Encounter for screening mammogram for malignant neoplasm of breast: Secondary | ICD-10-CM

## 2020-11-19 ENCOUNTER — Ambulatory Visit (INDEPENDENT_AMBULATORY_CARE_PROVIDER_SITE_OTHER): Payer: Medicare HMO | Admitting: *Deleted

## 2020-11-19 VITALS — Ht 61.0 in | Wt 116.4 lb

## 2020-11-19 DIAGNOSIS — Z0000 Encounter for general adult medical examination without abnormal findings: Secondary | ICD-10-CM | POA: Diagnosis not present

## 2020-11-19 NOTE — Progress Notes (Signed)
MEDICARE ANNUAL WELLNESS VISIT  11/19/2020  Telephone Visit Disclaimer This Medicare AWV was conducted by telephone due to national recommendations for restrictions regarding the COVID-19 Pandemic (e.g. social distancing).  I verified, using two identifiers, that I am speaking with [name] or their authorized healthcare agent. I discussed the limitations, risks, security, and privacy concerns of performing an evaluation and management service by telephone and the potential availability of an in-person appointment in the future. The patient expressed understanding and agreed to proceed.  Location of Patient: Home Location of Provider (nurse):  Office  Subjective:    [name] is a 80 y.o. female patient of Dettinger, Fransisca Kaufmann, MD who had a Medicare Annual Wellness Visit today via telephone. [name] is Retired and lives with their spouse. she has no children. she reports that she is socially active and does interact with friends/family regularly. she is minimally physically active and enjoys playing video games.  Patient Care Team: Dettinger, Fransisca Kaufmann, MD as PCP - General (Family Medicine) Minus Breeding, MD (Cardiology) Clarene Essex, MD (Gastroenterology) Skeet Latch, MD as Attending Physician (Cardiology) Sandford Craze, MD as Referring Physician (Dermatology)  Advanced Directives 11/19/2020 09/29/2020 07/17/2020 04/19/2020 11/19/2019 11/16/2018 01/19/2018  Does Patient Have a Medical Advance Directive? No No No No No No No  Would patient like information on creating a medical advance directive? Yes (MAU/Ambulatory/Procedural Areas - Information given) - No - Patient declined - No - Patient declined No - Patient declined No - Patient declined    Hospital Utilization Over the Past 12 Months: # of hospitalizations or ER visits: 0 # of surgeries: 0  Review of Systems    Patient reports that her overall health is unchanged compared to last year.  History obtained from  chart review and the patient General ROS: negative  Patient Reported Readings (BP, Pulse, CBG, Weight, etc) none  Pain Assessment Pain : No/denies pain     Current Medications & Allergies (verified) Allergies as of 11/19/2020      Reactions   Bisphosphonates Other (See Comments)   Difficulty swallowing   Ciprofloxacin Nausea And Vomiting   Codeine Nausea And Vomiting   Sulfa Antibiotics Nausea And Vomiting      Medication List       Accurate as of November 19, 2020 12:16 PM. If you have any questions, ask your nurse or doctor.        acetaminophen 500 MG tablet Commonly known as: TYLENOL Take 500 mg by mouth every 8 (eight) hours as needed for mild pain or moderate pain.   atorvastatin 80 MG tablet Commonly known as: LIPITOR TAKE 1 TABLET EVERY DAY   azelastine 0.1 % nasal spray Commonly known as: ASTELIN Place 1 spray into both nostrils 2 (two) times daily. Use in each nostril as directed   Calcium-Phosphorus-Vitamin D 250-115-250 MG-MG-UNIT Chew Commonly known as: Citracal Calcium Gummies Chew 1 each by mouth 2 (two) times daily.   Commode Bedside Misc Bedside commode due to tibial fracture   Wheelchair Misc Pt needs wheelchair due to extremity fractures   Misc. Devices Misc Use rolling walker to ambulate around the house as needed.   furosemide 20 MG tablet Commonly known as: LASIX TAKE 1 TABLET EVERY DAY   hydrochlorothiazide 25 MG tablet Commonly known as: HYDRODIURIL TAKE 1 TABLET EVERY DAY   levothyroxine 100 MCG tablet Commonly known as: SYNTHROID Take 0.5 tablets (50 mcg total) by mouth every Monday, Tuesday, Wednesday, Thursday, and Friday.   metoprolol  tartrate 25 MG tablet Commonly known as: LOPRESSOR Take 1 tablet (25 mg total) by mouth 2 (two) times daily as needed.   multivitamin with minerals Tabs tablet Take 2 tablets by mouth daily.   omeprazole 40 MG capsule Commonly known as: PRILOSEC TAKE 1 CAPSULE EVERY DAY   oxazepam 15  MG capsule Commonly known as: SERAX Take 1 capsule (15 mg total) by mouth 3 (three) times daily as needed for sleep.   polyethylene glycol 17 g packet Commonly known as: MIRALAX / GLYCOLAX Take 17 g by mouth daily as needed for moderate constipation.   potassium chloride 20 MEQ/15ML (10%) Soln TAKE 7.5ML (1&1/2 TEASPOONFUL) BY MOUTH DAILY AS NEEDED FOR CRAMPING   sucralfate 1 g tablet Commonly known as: CARAFATE TAKE 1 TABLET TWICE DAILY   triamcinolone ointment 0.5 % Commonly known as: KENALOG Apply 1 application topically 2 (two) times daily.   Vitamin D3 50 MCG (2000 UT) Chew Chew 4,000 Units by mouth daily.       History (reviewed): Past Medical History:  Diagnosis Date  . Anxiety states   . Breast cyst   . Cancer ([place])    skin  . Cataract   . Constipation   . Esophageal stricture   . Essential hypertension 12/18/2015  . Fracture, rib   . GERD (gastroesophageal reflux disease)   . Hiatal hernia   . Lower extremity edema 12/18/2015  . Osteoporosis   . Other and unspecified hyperlipidemia   . PAC (premature atrial contraction) 12/30/2015  . Palpitations 12/18/2015  . Unspecified hypothyroidism   . Vertebral fracture, osteoporotic [facility])    Past Surgical History:  Procedure Laterality Date  . ABDOMINAL HYSTERECTOMY    . APPENDECTOMY    . BREAST BIOPSY     bil cysts  . CATARACT EXTRACTION    . ESOPHAGOGASTRODUODENOSCOPY N/A 01/19/2018   Procedure: ESOPHAGOGASTRODUODENOSCOPY (EGD);  Surgeon: Clarene Essex, MD;  Location: [facility];  Service: Endoscopy;  Laterality: N/A;  with flouro  . EYE SURGERY    . HAND SURGERY Left   . IR GENERIC HISTORICAL  12/10/2016   IR RADIOLOGIST EVAL & MGMT 12/10/2016 [facility]  . IR GENERIC HISTORICAL  12/23/2016   IR KYPHO THORACIC WITH BONE BIOPSY 12/23/2016 Luanne Bras, MD [facility]  . PARTIAL HYSTERECTOMY    . THYROID LOBECTOMY     right  . VERTEBROPLASTY     Family History  Problem Relation Age of Onset  . Goiter  Father   . Alcohol abuse Father   . Heart disease Sister   . Cancer Sister        breast and skin  . Goiter Brother   . Dementia Brother   . Hyperlipidemia Brother   . Hernia Brother   . Cancer Brother        throat  . Cancer Brother        neck  . Heart disease Brother   . Early death Brother        auto accident   Social History   Socioeconomic History  . Marital status: Married    Spouse name: elmer  . Number of children: 0  . Years of education: 8  . Highest education level: 8th grade  Occupational History  . Occupation: retired    Comment: Musician  Tobacco Use  . Smoking status: Former Smoker    Types: Cigarettes    Quit date: 11/18/2005    Years since quitting: 15.0  . Smokeless tobacco: Never Used  .  Tobacco comment: Non-smoker  quit 12-13 years  Vaping Use  . Vaping Use: Never used  Substance and Sexual Activity  . Alcohol use: No  . Drug use: No  . Sexual activity: Not Currently    Birth control/protection: Surgical  Other Topics Concern  . Not on file  Social History Narrative  . Not on file   Social Determinants of Health   Financial Resource Strain: Low Risk   . Difficulty of Paying Living Expenses: Not hard at all  Food Insecurity: No Food Insecurity  . Worried About Charity fundraiser in the Last Year: Never true  . Ran Out of Food in the Last Year: Never true  Transportation Needs: No Transportation Needs  . Lack of Transportation (Medical): No  . Lack of Transportation (Non-Medical): No  Physical Activity: Insufficiently Active  . Days of Exercise per Week: 7 days  . Minutes of Exercise per Session: 10 min  Stress: No Stress Concern Present  . Feeling of Stress : Not at all  Social Connections: Socially Integrated  . Frequency of Communication with Friends and Family: More than three times a week  . Frequency of Social Gatherings with Friends and Family: More than three times a week  . Attends Religious Services: More than 4  times per year  . Active Member of Clubs or Organizations: Yes  . Attends Archivist Meetings: More than 4 times per year  . Marital Status: Married    Activities of Daily Living In your present state of health, do you have any difficulty performing the following activities: 11/19/2020  Hearing? Y  Vision? N  Difficulty concentrating or making decisions? N  Walking or climbing stairs? N  Dressing or bathing? N  Doing errands, shopping? N  Preparing Food and eating ? N  Using the Toilet? N  In the past six months, have you accidently leaked urine? N  Do you have problems with loss of bowel control? N  Managing your Medications? N  Managing your Finances? N  Housekeeping or managing your Housekeeping? N  Some recent data might be hidden    Patient Education/ Literacy How often do you need to have someone help you when you read instructions, pamphlets, or other written materials from your doctor or pharmacy?: 1 - Never What is the last grade level you completed in school?: 9th Grade  Exercise Current Exercise Habits: Home exercise routine, Type of exercise: walking, Time (Minutes): 15, Frequency (Times/Week): 7, Weekly Exercise (Minutes/Week): 105, Intensity: Mild  Diet Patient reports consuming 3 meals a day and 2 snack(s) a day Patient reports that her primary diet is: Regular Patient reports that she does have regular access to food.   Depression Screen PHQ 2/9 Scores 11/19/2020 11/19/2020 06/18/2020 05/05/2020 02/25/2020 11/22/2019 11/19/2019  PHQ - 2 Score 0 0 0 0 0 0 0     Fall Risk Fall Risk  11/19/2020 09/17/2020 06/18/2020 05/05/2020 02/25/2020  Falls in the past year? 1 1 0 0 0  Number falls in past yr: 0 0 - - -  Injury with Fall? 1 1 - - -  Risk Factor Category  - - - - -  Risk for fall due to : History of fall(s) Impaired balance/gait - - -  Follow up Falls evaluation completed Falls evaluation completed - - -     Objective:  [name] seemed alert and  oriented and she participated appropriately during our telephone visit.  Blood Pressure Weight BMI  BP  Readings from Last 3 Encounters:  09/17/20 120/78  08/20/20 118/84  07/29/20 127/82   Wt Readings from Last 3 Encounters:  11/19/20 116 lb 6.5 oz (52.8 kg)  09/17/20 116 lb 8 oz (52.8 kg)  07/17/20 122 lb (55.3 kg)   BMI Readings from Last 1 Encounters:  11/19/20 21.99 kg/m    *Unable to obtain current vital signs, weight, and BMI due to telephone visit type  Hearing/Vision  . Brittanny did  seem to have difficulty with hearing/understanding during the telephone conversation . Reports that she has not had a formal eye exam by an eye care professional within the past year . Reports that she has not had a formal hearing evaluation within the past year *Unable to fully assess hearing and vision during telephone visit type  Cognitive Function: 6CIT Screen 11/19/2020 11/19/2019  What Year? 0 points 0 points  What month? 0 points 0 points  What time? 0 points 0 points  Count back from 20 0 points 4 points  Months in reverse 2 points 4 points  Repeat phrase 2 points 10 points  Total Score 4 18   (Normal:0-7, Significant for Dysfunction: >8)  Normal Cognitive Function Screening: Yes   Immunization & Health Maintenance Record Immunization History  Administered Date(s) Administered  . Fluad Quad(high Dose 65+) 07/31/2019, 08/06/2020  . Influenza Whole 06/18/2010  . Influenza, High Dose Seasonal PF 08/19/2016, 08/02/2017, 08/01/2018  . Influenza,inj,Quad PF,6+ Mos 07/23/2013, 07/17/2014, 08/06/2015  . Moderna Sars-Covid-2 Vaccination 01/02/2020, 01/30/2020  . Pneumococcal Conjugate-13 11/05/2013  . Pneumococcal Polysaccharide-23 10/18/2004  . Td 08/18/2010  . Tdap 08/12/2011  . Zoster 08/26/2010    Health Maintenance  Topic Date Due  . COVID-19 Vaccine (3 - Booster for Moderna series) 07/31/2020  . TETANUS/TDAP  08/11/2021  . MAMMOGRAM  11/12/2021  . DEXA SCAN  09/22/2022  .  INFLUENZA VACCINE  Completed  . PNA vac Low Risk Adult  Completed       Assessment  This is a routine wellness examination for CHENOA LUDDY.  Health Maintenance: Due or Overdue Health Maintenance Due  Topic Date Due  . COVID-19 Vaccine (3 - Booster for Moderna series) 07/31/2020    [name] does not need a referral for Community Assistance: Care Management:   no Social Work:    no Prescription Assistance:  no Nutrition/Diabetes Education:  no   Plan:  Personalized Goals Goals Addressed            This Visit's Progress   . DIET - INCREASE WATER INTAKE       Try to drink 6-8 glasses of water daily  11/19/2020 AWV Goal: Improved Nutrition/Diet  . Patient will verbalize understanding that diet plays an important role in overall health and that a poor diet is a risk factor for many chronic medical conditions.  . Over the next year, patient will improve self management of their diet by incorporating better variety, improved meal pattern, more consistent meal timing, increased physical activity, and improved protein intake. . Patient will utilize available community resources to help with food acquisition if needed (ex: food pantries, [address], etc) . Patient will work with nutrition specialist if a referral was made       Personalized Health Maintenance & Screening Recommendations  Advanced directives: has NO advanced directive  - add't info requested. Referral to SW: no COVID VACCINE BOOSTER  Lung Cancer Screening Recommended: no (Low Dose CT Chest recommended if Age 78-80 years, 30 pack-year currently smoking OR  have quit w/in past 15 years) Hepatitis C Screening recommended: no HIV Screening recommended: no  Advanced Directives: Written information was prepared per patient's request.  Referrals & Orders No orders of the defined types were placed in this encounter.   Follow-up Plan . Follow-up with Dettinger, Fransisca Kaufmann, MD as planned    I have  personally reviewed and noted the following in the patient's chart:   . Medical and social history . Use of alcohol, tobacco or illicit drugs  . Current medications and supplements . Functional ability and status . Nutritional status . Physical activity . Advanced directives . List of other physicians . Hospitalizations, surgeries, and ER visits in previous 12 months . Vitals . Screenings to include cognitive, depression, and falls . Referrals and appointments  In addition, I have reviewed and discussed with [name] certain preventive protocols, quality metrics, and best practice recommendations. A written personalized care plan for preventive services as well as general preventive health recommendations is available and can be mailed to the patient at her request.      Wardell Heath, LPN  01/20/4097    AVS printed and mailed to patient

## 2020-11-19 NOTE — Patient Instructions (Addendum)
[place] Maintenance Summary and Written Plan of Care  Ms. [name] ,  Thank you for allowing me to perform your Medicare Annual Wellness Visit and for your ongoing commitment to your health.   Health Maintenance & Immunization History Health Maintenance  Topic Date Due  . COVID-19 Vaccine (3 - Booster for Moderna series) 07/31/2020  . TETANUS/TDAP  08/11/2021  . MAMMOGRAM  11/12/2021  . DEXA SCAN  09/22/2022  . INFLUENZA VACCINE  Completed  . PNA vac Low Risk Adult  Completed   Immunization History  Administered Date(s) Administered  . Fluad Quad(high Dose 65+) 07/31/2019, 08/06/2020  . Influenza Whole 06/18/2010  . Influenza, High Dose Seasonal PF 08/19/2016, 08/02/2017, 08/01/2018  . Influenza,inj,Quad PF,6+ Mos 07/23/2013, 07/17/2014, 08/06/2015  . Moderna Sars-Covid-2 Vaccination 01/02/2020, 01/30/2020  . Pneumococcal Conjugate-13 11/05/2013  . Pneumococcal Polysaccharide-23 10/18/2004  . Td 08/18/2010  . Tdap 08/12/2011  . Zoster 08/26/2010    These are the patient goals that we discussed: Goals Addressed            This Visit's Progress   . DIET - INCREASE WATER INTAKE       Try to drink 6-8 glasses of water daily  11/19/2020 AWV Goal: Improved Nutrition/Diet  . Patient will verbalize understanding that diet plays an important role in overall health and that a poor diet is a risk factor for many chronic medical conditions.  . Over the next year, patient will improve self management of their diet by incorporating better variety, improved meal pattern, more consistent meal timing, increased physical activity, and improved protein intake. . Patient will utilize available community resources to help with food acquisition if needed (ex: food pantries, [address], etc) . Patient will work with nutrition specialist if a referral was made         This is a list of Health Maintenance Items that are overdue or due now: Health Maintenance Due   Topic Date Due  . COVID-19 Vaccine (3 - Booster for Moderna series) 07/31/2020     Orders/Referrals Placed Today: No orders of the defined types were placed in this encounter.  (Contact our referral department at [phone] if you have not spoken with someone about your referral appointment within the next 5 days)    Follow-up Plan Follow up with Dr. Warrick Parisian as scheduled  What are Advance Directives? A living will allows you to document your wishes concerning medical treatments at the end of life.   Before your living will can guide medical decision-making two physicians must certify: You are unable to make medical decisions,  You are in the medical condition specified in the state's living will law (such as "terminal illness" or "permanent unconsciousness"),  Other requirements also may apply, depending upon the state. A medical power of attorney (or healthcare proxy) allows you to appoint a person you trust as your healthcare agent (or surrogate decision maker), who is authorized to make medical decisions on your behalf.   Before a medical power of attorney goes into effect a person's physician must conclude that they are unable to make their own medical decisions. In addition: If a person regains the ability to make decisions, the agent cannot continue to act on the person's behalf.  Many states have additional requirements that apply only to decisions about life-sustaining medical treatments.  For example, before your agent can refuse a life-sustaining treatment on your behalf, a second physician may have to confirm your doctor's assessment that you are incapable  of making treatment decisions. What Else Do I Need to Know?  Advance directives are legally valid throughout the [place]. While you do not need a lawyer to fill out an advance directive, your advance directive becomes legally valid as soon as you sign them in front of the required witnesses. The laws governing  advance directives vary from state to state, so it is important to complete and sign advance directives that comply with your state's law. Also, advance directives can have different titles in different states.  Emergency medical technicians cannot honor living wills or medical powers of attorney. Once emergency personnel have been called, they must do what is necessary to stabilize a person for transfer to a hospital, both from accident sites and from a home or other facility. After a physician fully evaluates the person's condition and determines the underlying conditions, advance directives can be implemented.  One state's advance directive does not always work in another state. Some states do honor advance directives from another state; others will honor out-of-state advance directives as long as they are similar to the state's own law; and some states do not have an answer to this question. The best solution is if you spend a significant amount of time in more than one state, you should complete the advance directives for all the states you spend a significant amount of time in.  Advance directives do not expire. An advance directive remains in effect until you change it. If you complete a new advance directive, it invalidates the previous one.  You should review your advance directives periodically to ensure that they still reflect your wishes. If you want to change anything in an advance directive once you have completed it, you should complete a whole new document. [facility], [email or web address]

## 2020-11-21 ENCOUNTER — Other Ambulatory Visit: Payer: Self-pay | Admitting: Family Medicine

## 2020-12-01 ENCOUNTER — Other Ambulatory Visit: Payer: Self-pay | Admitting: Family Medicine

## 2020-12-01 NOTE — Addendum Note (Signed)
Addended by: Antonietta Barcelona D on: 12/01/2020 04:02 PM   Modules accepted: Orders

## 2020-12-18 ENCOUNTER — Other Ambulatory Visit: Payer: Self-pay

## 2020-12-18 ENCOUNTER — Encounter: Payer: Self-pay | Admitting: Family Medicine

## 2020-12-18 ENCOUNTER — Other Ambulatory Visit: Payer: Self-pay | Admitting: Family Medicine

## 2020-12-18 ENCOUNTER — Ambulatory Visit (INDEPENDENT_AMBULATORY_CARE_PROVIDER_SITE_OTHER): Payer: Medicare HMO | Admitting: Family Medicine

## 2020-12-18 VITALS — BP 124/76 | HR 79 | Ht 61.0 in | Wt 117.0 lb

## 2020-12-18 DIAGNOSIS — Z79891 Long term (current) use of opiate analgesic: Secondary | ICD-10-CM | POA: Diagnosis not present

## 2020-12-18 DIAGNOSIS — R7303 Prediabetes: Secondary | ICD-10-CM

## 2020-12-18 DIAGNOSIS — N1832 Chronic kidney disease, stage 3b: Secondary | ICD-10-CM

## 2020-12-18 DIAGNOSIS — I10 Essential (primary) hypertension: Secondary | ICD-10-CM | POA: Diagnosis not present

## 2020-12-18 DIAGNOSIS — E782 Mixed hyperlipidemia: Secondary | ICD-10-CM

## 2020-12-18 DIAGNOSIS — E034 Atrophy of thyroid (acquired): Secondary | ICD-10-CM

## 2020-12-18 DIAGNOSIS — F411 Generalized anxiety disorder: Secondary | ICD-10-CM

## 2020-12-18 LAB — BAYER DCA HB A1C WAIVED: HB A1C (BAYER DCA - WAIVED): 5.7 % (ref <7.0)

## 2020-12-18 MED ORDER — OXAZEPAM 15 MG PO CAPS: 15.0000 mg | ORAL_CAPSULE | Freq: Three times a day (TID) | ORAL | 3 refills | Status: DC | PRN

## 2020-12-18 NOTE — Progress Notes (Addendum)
BP 124/76   Pulse 79   Ht 5' 1"  (1.549 m)   Wt 117 lb (53.1 kg)   SpO2 100%   BMI 22.11 kg/m    Subjective:   Patient ID: [name], female    DOB: 07/04/1941, 80 y.o.   MRN: [id number]  HPI: [name] is a 80 y.o. female presenting on 12/18/2020 for Medical Management of Chronic Issues, Hypertension, Hypothyroidism, and Gestational Diabetes   HPI Hypothyroidism recheck Patient is coming in for thyroid recheck today as well. They deny any issues with hair changes or heat or cold problems or diarrhea or constipation. They deny any chest pain or palpitations. They are currently on levothyroxine 100 micrograms   Hypertension Patient is currently on metoprolol and hydrochlorothiazide, and their blood pressure today is 124/76. Patient denies any lightheadedness or dizziness. Patient denies headaches, blurred vision, chest pains, shortness of breath, or weakness. Denies any side effects from medication and is content with current medication.   Hyperlipidemia Patient is coming in for recheck of his hyperlipidemia. The patient is currently taking no medication and has been diet controlled and resistant to statins because of issues.. They deny any issues with myalgias or history of liver damage from it. They deny any focal numbness or weakness or chest pain.   Prediabetes Patient comes in today for recheck of his diabetes. Patient has been currently taking no medication has been diet controlled, will check A1c today. Patient is not currently on an ACE inhibitor/ARB. Patient has not seen an ophthalmologist this year. Patient denies any issues with their feet. The symptom started onset as an adult hyperlipidemia hypothyroidism hypertension ARE RELATED TO DM   CKD recheck Patient has chronic kidney disease and has been stable. We will recheck levels today. Denies any urinary issues.  Anxiety recheck Current rx-oxazepam 15 mg 3 times daily as needed # meds rx-90 Effectiveness of  current meds-works well, no major side effects at this point. Adverse reactions form meds-none that she noted  Pill count performed-No Last drug screen -09/05/2019 ( high risk q33m moderate risk q673mlow risk yearly ) Urine drug screen today- Yes [place] [facility]  If yes were their any concerning findings? -None  No flowsheet data found.   Controlled substance contract signed on: Today  Relevant past medical, surgical, family and social history reviewed and updated as indicated. Interim medical history since our last visit reviewed. Allergies and medications reviewed and updated.  Review of Systems  Constitutional: Negative for chills and fever.  HENT: Negative for congestion, ear discharge and ear pain.   Eyes: Negative for redness and visual disturbance.  Respiratory: Negative for chest tightness and shortness of breath.   Cardiovascular: Negative for chest pain and leg swelling.  Genitourinary: Negative for difficulty urinating and dysuria.  Musculoskeletal: Negative for back pain and gait problem.  Skin: Negative for rash.  Neurological: Negative for light-headedness and headaches.  Psychiatric/Behavioral: Negative for agitation, behavioral problems, self-injury, sleep disturbance and suicidal ideas. The patient is nervous/anxious.   All other systems reviewed and are negative.   Per HPI unless specifically indicated above   Allergies as of 12/18/2020      Reactions   Bisphosphonates Other (See Comments)   Difficulty swallowing   Ciprofloxacin Nausea And Vomiting   Codeine Nausea And Vomiting   Sulfa Antibiotics Nausea And Vomiting      Medication List       Accurate as of December 18, 2020 10:47 AM. If you have  any questions, ask your nurse or doctor.        acetaminophen 500 MG tablet Commonly known as: TYLENOL Take 500 mg by mouth every 8 (eight) hours as needed for mild pain or moderate pain.   atorvastatin 80 MG tablet Commonly known as:  LIPITOR TAKE 1 TABLET EVERY DAY   azelastine 0.1 % nasal spray Commonly known as: ASTELIN Place 1 spray into both nostrils 2 (two) times daily. Use in each nostril as directed   Calcium-Phosphorus-Vitamin D 250-115-250 MG-MG-UNIT Chew Commonly known as: Citracal Calcium Gummies Chew 1 each by mouth 2 (two) times daily.   Commode Bedside Misc Bedside commode due to tibial fracture   Wheelchair Misc Pt needs wheelchair due to extremity fractures   Misc. Devices Misc Use rolling walker to ambulate around the house as needed.   furosemide 20 MG tablet Commonly known as: LASIX TAKE 1 TABLET EVERY DAY   hydrochlorothiazide 25 MG tablet Commonly known as: HYDRODIURIL TAKE 1 TABLET EVERY DAY   levothyroxine 100 MCG tablet Commonly known as: SYNTHROID Take 0.5 tablets (50 mcg total) by mouth every Monday, Tuesday, Wednesday, Thursday, and Friday.   metoprolol tartrate 25 MG tablet Commonly known as: LOPRESSOR Take 1 tablet (25 mg total) by mouth 2 (two) times daily as needed.   multivitamin with minerals Tabs tablet Take 2 tablets by mouth daily.   omeprazole 40 MG capsule Commonly known as: PRILOSEC TAKE 1 CAPSULE EVERY DAY   oxazepam 15 MG capsule Commonly known as: SERAX TAKE ONE (1) CAPSULE THREE (3) TIMES EACH DAY AS NEEDED   polyethylene glycol 17 g packet Commonly known as: MIRALAX / GLYCOLAX Take 17 g by mouth daily as needed for moderate constipation.   potassium chloride 20 MEQ/15ML (10%) Soln TAKE 7.5ML (1&1/2 TEASPOONFUL) BY MOUTH DAILY AS NEEDED FOR CRAMPING   sucralfate 1 g tablet Commonly known as: CARAFATE TAKE 1 TABLET TWICE DAILY   triamcinolone ointment 0.5 % Commonly known as: KENALOG APPLY TO THE AFFECTED AREA(S) TOPICALLY TWICE DAILY   Vitamin D3 50 MCG (2000 UT) Chew Chew 4,000 Units by mouth daily.        Objective:   BP 124/76   Pulse 79   Ht 5' 1"  (1.549 m)   Wt 117 lb (53.1 kg)   SpO2 100%   BMI 22.11 kg/m   Wt Readings  from Last 3 Encounters:  12/18/20 117 lb (53.1 kg)  11/19/20 116 lb 6.5 oz (52.8 kg)  09/17/20 116 lb 8 oz (52.8 kg)    Physical Exam Vitals and nursing note reviewed.  Constitutional:      General: She is not in acute distress.    Appearance: She is well-developed and well-nourished. She is not diaphoretic.  Eyes:     Extraocular Movements: EOM normal.     Conjunctiva/sclera: Conjunctivae normal.  Cardiovascular:     Rate and Rhythm: Normal rate and regular rhythm.     Pulses: Intact distal pulses.     Heart sounds: Normal heart sounds. No murmur heard.   Pulmonary:     Effort: Pulmonary effort is normal. No respiratory distress.     Breath sounds: Normal breath sounds. No wheezing.  Musculoskeletal:        General: No tenderness or edema. Normal range of motion.  Skin:    General: Skin is warm and dry.     Findings: No rash.  Neurological:     Mental Status: She is alert and oriented to person, place, and time.  Coordination: Coordination normal.  Psychiatric:        Mood and Affect: Mood and affect normal.        Behavior: Behavior normal.       Assessment & Plan:   Problem List Items Addressed This Visit      Cardiovascular and Mediastinum   Essential hypertension   Relevant Orders   CBC with Differential/Platelet   CMP14+EGFR     Endocrine   Hypothyroid   Relevant Orders   CBC with Differential/Platelet   TSH     Genitourinary   Chronic kidney disease (CKD) stage G3b/A1, moderately decreased glomerular filtration rate (GFR) between 30-44 mL/min/1.73 square meter and albuminuria creatinine ratio less than 30 mg/g (HCC)   Relevant Orders   CBC with Differential/Platelet     Other   Hyperlipemia   Relevant Orders   CMP14+EGFR   Lipid panel   Generalized anxiety disorder   Relevant Medications   oxazepam (SERAX) 15 MG capsule   Other Relevant Orders   ToxASSURE Select 13 (MW), Urine   Prediabetes - Primary   Relevant Orders   CBC with  Differential/Platelet   Bayer DCA Hb A1c Waived      Continue current medication, did refill for oxazepam. Will check blood work and a urine drug screen today.   Follow up plan: Return in about 3 months (around 03/20/2021), or if symptoms worsen or fail to improve, for Anxiety recheck.  Counseling provided for all of the vaccine components No orders of the defined types were placed in this encounter.   Caryl Pina, MD [facility] 12/18/2020, 10:47 AM

## 2020-12-19 LAB — CBC WITH DIFFERENTIAL/PLATELET
Basophils Absolute: 0.1 10*3/uL (ref 0.0–0.2)
Basos: 1 %
EOS (ABSOLUTE): 0.2 10*3/uL (ref 0.0–0.4)
Eos: 3 %
Hematocrit: 35.5 % (ref 34.0–46.6)
Hemoglobin: 11.8 g/dL (ref 11.1–15.9)
Immature Grans (Abs): 0 10*3/uL (ref 0.0–0.1)
Immature Granulocytes: 0 %
Lymphocytes Absolute: 1.8 10*3/uL (ref 0.7–3.1)
Lymphs: 31 %
MCH: 30.3 pg (ref 26.6–33.0)
MCHC: 33.2 g/dL (ref 31.5–35.7)
MCV: 91 fL (ref 79–97)
Monocytes Absolute: 0.6 10*3/uL (ref 0.1–0.9)
Monocytes: 10 %
Neutrophils Absolute: 3.2 10*3/uL (ref 1.4–7.0)
Neutrophils: 55 %
Platelets: 251 10*3/uL (ref 150–450)
RBC: 3.9 x10E6/uL (ref 3.77–5.28)
RDW: 12.8 % (ref 11.7–15.4)
WBC: 5.8 10*3/uL (ref 3.4–10.8)

## 2020-12-19 LAB — CMP14+EGFR
ALT: 13 IU/L (ref 0–32)
AST: 19 IU/L (ref 0–40)
Albumin/Globulin Ratio: 1.4 (ref 1.2–2.2)
Albumin: 4.2 g/dL (ref 3.6–4.6)
Alkaline Phosphatase: 94 IU/L (ref 44–121)
BUN/Creatinine Ratio: 15 (ref 12–28)
BUN: 17 mg/dL (ref 8–27)
Bilirubin Total: 0.4 mg/dL (ref 0.0–1.2)
CO2: 22 mmol/L (ref 20–29)
Calcium: 9.5 mg/dL (ref 8.7–10.3)
Chloride: 102 mmol/L (ref 96–106)
Creatinine, Ser: 1.16 mg/dL — ABNORMAL HIGH (ref 0.57–1.00)
Globulin, Total: 2.9 g/dL (ref 1.5–4.5)
Glucose: 87 mg/dL (ref 65–99)
Potassium: 4.3 mmol/L (ref 3.5–5.2)
Sodium: 142 mmol/L (ref 134–144)
Total Protein: 7.1 g/dL (ref 6.0–8.5)
eGFR: 47 mL/min/{1.73_m2} — ABNORMAL LOW (ref >59)

## 2020-12-19 LAB — LIPID PANEL
Chol/HDL Ratio: 3.2 ratio (ref 0.0–4.4)
Cholesterol, Total: 150 mg/dL (ref 100–199)
HDL: 47 mg/dL (ref >39)
LDL Chol Calc (NIH): 88 mg/dL (ref 0–99)
Triglycerides: 80 mg/dL (ref 0–149)
VLDL Cholesterol Cal: 15 mg/dL (ref 5–40)

## 2020-12-19 LAB — TSH: TSH: 0.762 u[IU]/mL (ref 0.450–4.500)

## 2020-12-24 ENCOUNTER — Other Ambulatory Visit: Payer: Self-pay | Admitting: Family Medicine

## 2020-12-27 LAB — TOXASSURE SELECT 13 (MW), URINE

## 2021-01-19 ENCOUNTER — Other Ambulatory Visit: Payer: Self-pay | Admitting: Family Medicine

## 2021-01-26 ENCOUNTER — Telehealth: Payer: Self-pay | Admitting: Physical Therapy

## 2021-01-26 NOTE — Telephone Encounter (Signed)
S/w husband he is trying to get the insurance company to pay a $90.00 bill -dened b/c MD names is not on the bill. He will call the ins company back with MD name and see if that helps

## 2021-02-24 ENCOUNTER — Other Ambulatory Visit: Payer: Self-pay | Admitting: Family Medicine

## 2021-03-27 ENCOUNTER — Ambulatory Visit (INDEPENDENT_AMBULATORY_CARE_PROVIDER_SITE_OTHER): Payer: Medicare HMO | Admitting: Family Medicine

## 2021-03-27 ENCOUNTER — Encounter: Payer: Self-pay | Admitting: Family Medicine

## 2021-03-27 ENCOUNTER — Other Ambulatory Visit: Payer: Self-pay

## 2021-03-27 VITALS — BP 127/73 | HR 71 | Ht 61.0 in | Wt 118.0 lb

## 2021-03-27 DIAGNOSIS — E782 Mixed hyperlipidemia: Secondary | ICD-10-CM

## 2021-03-27 DIAGNOSIS — R634 Abnormal weight loss: Secondary | ICD-10-CM | POA: Diagnosis not present

## 2021-03-27 DIAGNOSIS — K219 Gastro-esophageal reflux disease without esophagitis: Secondary | ICD-10-CM | POA: Diagnosis not present

## 2021-03-27 DIAGNOSIS — R630 Anorexia: Secondary | ICD-10-CM | POA: Diagnosis not present

## 2021-03-27 DIAGNOSIS — E034 Atrophy of thyroid (acquired): Secondary | ICD-10-CM

## 2021-03-27 DIAGNOSIS — I10 Essential (primary) hypertension: Secondary | ICD-10-CM | POA: Diagnosis not present

## 2021-03-27 DIAGNOSIS — R7303 Prediabetes: Secondary | ICD-10-CM

## 2021-03-27 DIAGNOSIS — F411 Generalized anxiety disorder: Secondary | ICD-10-CM | POA: Diagnosis not present

## 2021-03-27 DIAGNOSIS — N1832 Chronic kidney disease, stage 3b: Secondary | ICD-10-CM | POA: Diagnosis not present

## 2021-03-27 DIAGNOSIS — R0989 Other specified symptoms and signs involving the circulatory and respiratory systems: Secondary | ICD-10-CM

## 2021-03-27 LAB — BAYER DCA HB A1C WAIVED: HB A1C (BAYER DCA - WAIVED): 5.8 % (ref <7.0)

## 2021-03-27 MED ORDER — HYDROCHLOROTHIAZIDE 25 MG PO TABS: 1.0000 | ORAL_TABLET | Freq: Every day | ORAL | 3 refills | Status: DC

## 2021-03-27 MED ORDER — MIRTAZAPINE 15 MG PO TBDP
15.0000 mg | ORAL_TABLET | Freq: Every day | ORAL | 1 refills | Status: DC
Start: 2021-03-27 — End: 2021-11-27

## 2021-03-27 MED ORDER — OXAZEPAM 15 MG PO CAPS: 15.0000 mg | ORAL_CAPSULE | Freq: Three times a day (TID) | ORAL | 3 refills | Status: DC | PRN

## 2021-03-27 MED ORDER — SUCRALFATE 1 G PO TABS: 1.0000 g | ORAL_TABLET | Freq: Two times a day (BID) | ORAL | 3 refills | Status: DC

## 2021-03-27 MED ORDER — POTASSIUM CHLORIDE 20 MEQ/15ML (10%) PO SOLN: 10.0000 meq | Freq: Every day | ORAL | 2 refills | Status: DC | PRN

## 2021-03-27 MED ORDER — OMEPRAZOLE 40 MG PO CPDR
1.0000 | DELAYED_RELEASE_CAPSULE | Freq: Every day | ORAL | 0 refills | Status: DC
Start: 2021-03-27 — End: 2021-07-27

## 2021-03-27 NOTE — Progress Notes (Signed)
BP 127/73   Pulse 71   Ht 5\' 1"  (1.549 m)   Wt 118 lb (53.5 kg)   SpO2 97%   BMI 22.30 kg/m    Subjective:   Patient ID: [name], female    DOB: 1941-06-22, 80 y.o.   MRN: [id number]  HPI: [name] is a 80 y.o. female presenting on 03/27/2021 for No chief complaint on file.   HPI Abdominal pulsation, she is noticed recently that she is has a pulse or heartbeat in her abdomen and she can see it when she lays flat.  Prediabetes Patient comes in today for recheck of his diabetes. Patient has been currently taking diet controlled. Patient is not currently on an ACE inhibitor/ARB. Patient has not seen an ophthalmologist this year. Patient denies any issues with their feet. The symptom started onset as an adult hypothyroidism and hypertension and hyperlipidemia ARE RELATED TO DM   Hypothyroidism recheck Patient is coming in for thyroid recheck today as well. They deny any issues with hair changes or heat or cold problems or diarrhea or constipation. They deny any chest pain or palpitations. They are currently on levothyroxine 100 micrograms   Hypertension Patient is currently on hydrochlorothiazide and metoprolol, and their blood pressure today is 127/73. Patient denies any lightheadedness or dizziness. Patient denies headaches, blurred vision, chest pains, shortness of breath, or weakness. Denies any side effects from medication and is content with current medication.   Hyperlipidemia Patient is coming in for recheck of his hyperlipidemia. The patient is currently taking Lipitor. They deny any issues with myalgias or history of liver damage from it. They deny any focal numbness or weakness or chest pain.   Anxiety and poor appetite, wants to start something for appetite Current rx-oxazepam 15 mg 3 times daily as needed # meds rx- 90 Effectiveness of current meds-works well Adverse reactions form meds-none  Pill count performed-No Last drug screen -12/22/2020 ( high  risk q52m, moderate risk q13m, low risk yearly ) Urine drug screen today- No Was the [employer] reviewed-yes  If yes were their any concerning findings? -None  No flowsheet data found.   Controlled substance contract signed on: 12/22/2020  Relevant past medical, surgical, family and social history reviewed and updated as indicated. Interim medical history since our last visit reviewed. Allergies and medications reviewed and updated.  Review of Systems  Constitutional:  Positive for appetite change. Negative for chills and fever.  HENT:  Negative for congestion, ear discharge and ear pain.   Eyes:  Negative for redness and visual disturbance.  Respiratory:  Negative for chest tightness and shortness of breath.   Cardiovascular:  Negative for chest pain and leg swelling.  Genitourinary:  Negative for difficulty urinating.  Musculoskeletal:  Negative for back pain and gait problem.  Skin:  Negative for rash.  Neurological:  Negative for light-headedness and headaches.  Psychiatric/Behavioral:  Negative for agitation and behavioral problems. The patient is nervous/anxious.   All other systems reviewed and are negative.  Per HPI unless specifically indicated above   Allergies as of 03/27/2021       Reactions   Bisphosphonates Other (See Comments)   Difficulty swallowing   Ciprofloxacin Nausea And Vomiting   Codeine Nausea And Vomiting   Sulfa Antibiotics Nausea And Vomiting        Medication List        Accurate as of March 27, 2021 10:57 AM. If you have any questions, ask your nurse or doctor.  acetaminophen 500 MG tablet Commonly known as: TYLENOL Take 500 mg by mouth every 8 (eight) hours as needed for mild pain or moderate pain.   atorvastatin 80 MG tablet Commonly known as: LIPITOR TAKE 1 TABLET EVERY DAY   azelastine 0.1 % nasal spray Commonly known as: ASTELIN Place 1 spray into both nostrils 2 (two) times daily. Use in each nostril as directed    Calcium-Phosphorus-Vitamin D 250-115-250 MG-MG-UNIT Chew Commonly known as: Citracal Calcium Gummies Chew 1 each by mouth 2 (two) times daily.   Commode Bedside Misc Bedside commode due to tibial fracture   Wheelchair Misc Pt needs wheelchair due to extremity fractures   Misc. Devices Misc Use rolling walker to ambulate around the house as needed.   furosemide 20 MG tablet Commonly known as: LASIX TAKE 1 TABLET EVERY DAY   hydrochlorothiazide 25 MG tablet Commonly known as: HYDRODIURIL TAKE 1 TABLET EVERY DAY   levothyroxine 100 MCG tablet Commonly known as: SYNTHROID TAKE 1/2 TABLET EVERY MONDAY, TUESDAY, WEDNESDAY, THURSDAY, AND FRIDAY.   metoprolol tartrate 25 MG tablet Commonly known as: LOPRESSOR Take 1 tablet (25 mg total) by mouth 2 (two) times daily as needed.   multivitamin with minerals Tabs tablet Take 2 tablets by mouth daily.   omeprazole 40 MG capsule Commonly known as: PRILOSEC TAKE 1 CAPSULE EVERY DAY   oxazepam 15 MG capsule Commonly known as: SERAX Take 1 capsule (15 mg total) by mouth 3 (three) times daily as needed for sleep.   polyethylene glycol 17 g packet Commonly known as: MIRALAX / GLYCOLAX Take 17 g by mouth daily as needed for moderate constipation.   potassium chloride 20 MEQ/15ML (10%) Soln TAKE 7.5ML (1&1/2 TEASPOONFUL) BY MOUTH DAILY AS NEEDED FOR CRAMPING   sucralfate 1 g tablet Commonly known as: CARAFATE TAKE 1 TABLET TWICE DAILY   triamcinolone ointment 0.5 % Commonly known as: KENALOG APPLY TO THE AFFECTED AREA(S) TOPICALLY TWICE DAILY   Vitamin D3 50 MCG (2000 UT) Chew Chew 4,000 Units by mouth daily.         Objective:   BP 127/73   Pulse 71   Ht 5\' 1"  (1.549 m)   Wt 118 lb (53.5 kg)   SpO2 97%   BMI 22.30 kg/m   Wt Readings from Last 3 Encounters:  03/27/21 118 lb (53.5 kg)  12/18/20 117 lb (53.1 kg)  11/19/20 116 lb 6.5 oz (52.8 kg)    Physical Exam Vitals and nursing note reviewed.   Constitutional:      General: She is not in acute distress.    Appearance: She is well-developed. She is not diaphoretic.  Eyes:     Conjunctiva/sclera: Conjunctivae normal.  Cardiovascular:     Rate and Rhythm: Normal rate and regular rhythm.     Heart sounds: Normal heart sounds. No murmur heard. Pulmonary:     Effort: Pulmonary effort is normal. No respiratory distress.     Breath sounds: Normal breath sounds. No wheezing.  Musculoskeletal:        General: No tenderness. Normal range of motion.  Skin:    General: Skin is warm and dry.     Findings: No rash.  Neurological:     Mental Status: She is alert and oriented to person, place, and time.     Coordination: Coordination normal.  Psychiatric:        Mood and Affect: Mood is anxious and depressed.        Behavior: Behavior normal.  Thought Content: Thought content does not include suicidal ideation. Thought content does not include suicidal plan.      Assessment & Plan:   Problem List Items Addressed This Visit       Cardiovascular and Mediastinum   Essential hypertension   Relevant Medications   potassium chloride 20 MEQ/15ML (10%) SOLN   hydrochlorothiazide (HYDRODIURIL) 25 MG tablet     Digestive   GERD (gastroesophageal reflux disease)   Relevant Medications   omeprazole (PRILOSEC) 40 MG capsule   sucralfate (CARAFATE) 1 g tablet     Endocrine   Hypothyroid   Relevant Orders   TSH     Genitourinary   Chronic kidney disease (CKD) stage G3b/A1, moderately decreased glomerular filtration rate (GFR) between 30-44 mL/min/1.73 square meter and albuminuria creatinine ratio less than 30 mg/g (HCC)     Other   Hyperlipemia - Primary   Relevant Medications   hydrochlorothiazide (HYDRODIURIL) 25 MG tablet   Generalized anxiety disorder   Relevant Medications   oxazepam (SERAX) 15 MG capsule   mirtazapine (REMERON SOL-TAB) 15 MG disintegrating tablet   Prediabetes   Relevant Orders   Bayer DCA Hb A1c  Waived (Completed)   Other Visit Diagnoses     Weight loss, unintentional       Relevant Medications   mirtazapine (REMERON SOL-TAB) 15 MG disintegrating tablet   Decreased appetite       Relevant Medications   mirtazapine (REMERON SOL-TAB) 15 MG disintegrating tablet   Abdominal aortic pulsation       Relevant Orders   US AORTA DUPLEX LIMITED       Ordered aortic ultrasound because she is seeing her pulse when she lays flat on her abdomen.  Will start Remeron to see if that helps with her appetite. Follow up plan: Return in about 4 months (around 07/27/2021), or if symptoms worsen or fail to improve, for Anxiety and hypertension and thyroid.  Counseling provided for all of the vaccine components No orders of the defined types were placed in this encounter.   Caryl Pina, MD [facility] 03/27/2021, 10:57 AM

## 2021-03-28 LAB — TSH: TSH: 0.707 u[IU]/mL (ref 0.450–4.500)

## 2021-03-30 DIAGNOSIS — H524 Presbyopia: Secondary | ICD-10-CM | POA: Diagnosis not present

## 2021-03-30 DIAGNOSIS — Z0100 Encounter for examination of eyes and vision without abnormal findings: Secondary | ICD-10-CM | POA: Diagnosis not present

## 2021-04-16 ENCOUNTER — Other Ambulatory Visit: Payer: Self-pay

## 2021-04-16 ENCOUNTER — Ambulatory Visit (HOSPITAL_COMMUNITY)
Admission: RE | Admit: 2021-04-16 | Discharge: 2021-04-16 | Disposition: A | Discharge status: Home / Self Care | Payer: Medicare HMO | Source: Ambulatory Visit | Attending: Family Medicine | Admitting: Family Medicine

## 2021-04-16 DIAGNOSIS — N1832 Chronic kidney disease, stage 3b: Secondary | ICD-10-CM | POA: Insufficient documentation

## 2021-04-16 DIAGNOSIS — Z87891 Personal history of nicotine dependence: Secondary | ICD-10-CM | POA: Diagnosis not present

## 2021-04-16 DIAGNOSIS — R0989 Other specified symptoms and signs involving the circulatory and respiratory systems: Secondary | ICD-10-CM | POA: Diagnosis not present

## 2021-04-16 DIAGNOSIS — I700 Atherosclerosis of aorta: Secondary | ICD-10-CM | POA: Insufficient documentation

## 2021-04-16 DIAGNOSIS — I129 Hypertensive chronic kidney disease with stage 1 through stage 4 chronic kidney disease, or unspecified chronic kidney disease: Secondary | ICD-10-CM | POA: Insufficient documentation

## 2021-04-16 DIAGNOSIS — Z136 Encounter for screening for cardiovascular disorders: Secondary | ICD-10-CM | POA: Diagnosis not present

## 2021-04-17 ENCOUNTER — Telehealth: Payer: Self-pay

## 2021-04-17 MED ORDER — METOPROLOL TARTRATE 25 MG PO TABS: 25.0000 mg | ORAL_TABLET | Freq: Two times a day (BID) | ORAL | 0 refills | Status: DC | PRN

## 2021-04-17 NOTE — Telephone Encounter (Signed)
Pt tooker Remeron for one night-  felt " foolish when she woke the next morning. Informed pt that it takes some time for it to help and to get past the drowsy feeling.  Pt agreed to try again and will try it for over one week. She will call back with update.  Pt would like refill of Metoprolol. Refills sent.

## 2021-05-13 ENCOUNTER — Emergency Department (HOSPITAL_COMMUNITY): Payer: Medicare HMO

## 2021-05-13 ENCOUNTER — Emergency Department (HOSPITAL_COMMUNITY)
Admission: EM | Admit: 2021-05-13 | Discharge: 2021-05-13 | Disposition: A | Discharge status: Home / Self Care | Payer: Medicare HMO | Attending: Emergency Medicine | Admitting: Emergency Medicine

## 2021-05-13 ENCOUNTER — Other Ambulatory Visit: Payer: Self-pay

## 2021-05-13 ENCOUNTER — Encounter (HOSPITAL_COMMUNITY): Payer: Self-pay | Admitting: *Deleted

## 2021-05-13 DIAGNOSIS — R112 Nausea with vomiting, unspecified: Secondary | ICD-10-CM

## 2021-05-13 DIAGNOSIS — R000 Tachycardia, unspecified: Secondary | ICD-10-CM | POA: Diagnosis not present

## 2021-05-13 DIAGNOSIS — E876 Hypokalemia: Secondary | ICD-10-CM | POA: Diagnosis not present

## 2021-05-13 DIAGNOSIS — N1832 Chronic kidney disease, stage 3b: Secondary | ICD-10-CM | POA: Diagnosis not present

## 2021-05-13 DIAGNOSIS — Z87891 Personal history of nicotine dependence: Secondary | ICD-10-CM | POA: Insufficient documentation

## 2021-05-13 DIAGNOSIS — R197 Diarrhea, unspecified: Secondary | ICD-10-CM | POA: Diagnosis not present

## 2021-05-13 DIAGNOSIS — E039 Hypothyroidism, unspecified: Secondary | ICD-10-CM | POA: Diagnosis not present

## 2021-05-13 DIAGNOSIS — K529 Noninfective gastroenteritis and colitis, unspecified: Secondary | ICD-10-CM

## 2021-05-13 DIAGNOSIS — I129 Hypertensive chronic kidney disease with stage 1 through stage 4 chronic kidney disease, or unspecified chronic kidney disease: Secondary | ICD-10-CM | POA: Insufficient documentation

## 2021-05-13 DIAGNOSIS — Z79899 Other long term (current) drug therapy: Secondary | ICD-10-CM | POA: Insufficient documentation

## 2021-05-13 DIAGNOSIS — Z85828 Personal history of other malignant neoplasm of skin: Secondary | ICD-10-CM | POA: Diagnosis not present

## 2021-05-13 DIAGNOSIS — R109 Unspecified abdominal pain: Secondary | ICD-10-CM | POA: Diagnosis not present

## 2021-05-13 LAB — CBC WITH DIFFERENTIAL/PLATELET
Abs Immature Granulocytes: 0.02 10*3/uL (ref 0.00–0.07)
Basophils Absolute: 0 10*3/uL (ref 0.0–0.1)
Basophils Relative: 0 %
Eosinophils Absolute: 0 10*3/uL (ref 0.0–0.5)
Eosinophils Relative: 1 %
HCT: 40.1 % (ref 36.0–46.0)
Hemoglobin: 12.8 g/dL (ref 12.0–15.0)
Immature Granulocytes: 0 %
Lymphocytes Relative: 12 %
Lymphs Abs: 0.7 10*3/uL (ref 0.7–4.0)
MCH: 31.1 pg (ref 26.0–34.0)
MCHC: 31.9 g/dL (ref 30.0–36.0)
MCV: 97.3 fL (ref 80.0–100.0)
Monocytes Absolute: 0.5 10*3/uL (ref 0.1–1.0)
Monocytes Relative: 8 %
Neutro Abs: 4.2 10*3/uL (ref 1.7–7.7)
Neutrophils Relative %: 79 %
Platelets: 196 10*3/uL (ref 150–400)
RBC: 4.12 MIL/uL (ref 3.87–5.11)
RDW: 13.7 % (ref 11.5–15.5)
WBC: 5.4 10*3/uL (ref 4.0–10.5)
nRBC: 0 % (ref 0.0–0.2)

## 2021-05-13 LAB — COMPREHENSIVE METABOLIC PANEL
ALT: 15 U/L (ref 0–44)
AST: 27 U/L (ref 15–41)
Albumin: 3.8 g/dL (ref 3.5–5.0)
Alkaline Phosphatase: 73 U/L (ref 38–126)
Anion gap: 9 (ref 5–15)
BUN: 18 mg/dL (ref 8–23)
CO2: 23 mmol/L (ref 22–32)
Calcium: 8.5 mg/dL — ABNORMAL LOW (ref 8.9–10.3)
Chloride: 102 mmol/L (ref 98–111)
Creatinine, Ser: 1.24 mg/dL — ABNORMAL HIGH (ref 0.44–1.00)
GFR, Estimated: 43 mL/min — ABNORMAL LOW (ref >60)
Glucose, Bld: 115 mg/dL — ABNORMAL HIGH (ref 70–99)
Potassium: 2.9 mmol/L — ABNORMAL LOW (ref 3.5–5.1)
Sodium: 134 mmol/L — ABNORMAL LOW (ref 135–145)
Total Bilirubin: 0.8 mg/dL (ref 0.3–1.2)
Total Protein: 7.3 g/dL (ref 6.5–8.1)

## 2021-05-13 LAB — URINALYSIS, ROUTINE W REFLEX MICROSCOPIC
Bilirubin Urine: NEGATIVE
Glucose, UA: NEGATIVE mg/dL
Hgb urine dipstick: NEGATIVE
Ketones, ur: NEGATIVE mg/dL
Leukocytes,Ua: NEGATIVE
Nitrite: NEGATIVE
Protein, ur: NEGATIVE mg/dL
Specific Gravity, Urine: 1.033 — ABNORMAL HIGH (ref 1.005–1.030)
pH: 6 (ref 5.0–8.0)

## 2021-05-13 LAB — LIPASE, BLOOD: Lipase: 24 U/L (ref 11–51)

## 2021-05-13 LAB — MAGNESIUM: Magnesium: 1.4 mg/dL — ABNORMAL LOW (ref 1.7–2.4)

## 2021-05-13 MED ORDER — MORPHINE SULFATE (PF) 2 MG/ML IV SOLN
2.0000 mg | Freq: Once | INTRAVENOUS | Status: AC
  Administered 2021-05-13: 2 mg via INTRAVENOUS
  Filled 2021-05-13: qty 1

## 2021-05-13 MED ORDER — IOHEXOL 300 MG/ML  SOLN
75.0000 mL | Freq: Once | INTRAMUSCULAR | Status: AC | PRN
  Administered 2021-05-13: 75 mL via INTRAVENOUS

## 2021-05-13 MED ORDER — POTASSIUM CHLORIDE 10 MEQ/100ML IV SOLN
10.0000 meq | Freq: Once | INTRAVENOUS | Status: AC
  Administered 2021-05-13: 10 meq via INTRAVENOUS
  Filled 2021-05-13: qty 100

## 2021-05-13 MED ORDER — SODIUM CHLORIDE 0.9 % IV BOLUS
1000.0000 mL | Freq: Once | INTRAVENOUS | Status: AC
  Administered 2021-05-13: 1000 mL via INTRAVENOUS

## 2021-05-13 MED ORDER — ONDANSETRON 4 MG PO TBDP: 4.0000 mg | ORAL_TABLET | Freq: Three times a day (TID) | ORAL | 0 refills | Status: DC | PRN

## 2021-05-13 MED ORDER — ONDANSETRON HCL 4 MG/2ML IJ SOLN
4.0000 mg | Freq: Once | INTRAMUSCULAR | Status: AC
  Administered 2021-05-13: 4 mg via INTRAVENOUS
  Filled 2021-05-13: qty 2

## 2021-05-13 MED ORDER — POTASSIUM CHLORIDE CRYS ER 20 MEQ PO TBCR
40.0000 meq | EXTENDED_RELEASE_TABLET | Freq: Once | ORAL | Status: AC
  Administered 2021-05-13: 40 meq via ORAL
  Filled 2021-05-13: qty 2

## 2021-05-13 MED ORDER — MAGNESIUM SULFATE 2 GM/50ML IV SOLN
2.0000 g | Freq: Once | INTRAVENOUS | Status: AC
  Administered 2021-05-13: 2 g via INTRAVENOUS
  Filled 2021-05-13: qty 50

## 2021-05-13 NOTE — ED Notes (Signed)
Pt treatment complete, pt awaiting disposition. Pt has had no BMs/diarrhea while in dept

## 2021-05-13 NOTE — ED Notes (Signed)
ED Provider at bedside. 

## 2021-05-13 NOTE — ED Notes (Signed)
Put pt on bedside commode

## 2021-05-13 NOTE — ED Provider Notes (Signed)
  Face-to-face evaluation   History: She presents for evaluation of nausea, vomiting and diarrhea.  She is currently taking loperamide and has not had a diarrheal stool since noon today.  She denies abdominal pain, weakness dizziness at this time.  She has not had fever cough or shortness of breath.  Husband is with her and helps give history because she is somewhat inaccurate.  Physical exam: Alert elderly patient.  She appears comfortable.  Abdomen soft nontender.  No dysarthria or aphasia  Medical screening examination/treatment/procedure(s) were conducted as a shared visit with non-physician practitioner(s) and myself.  I personally evaluated the patient during the encounter    Daleen Bo, MD 05/14/21 [phone]

## 2021-05-13 NOTE — ED Provider Notes (Signed)
[facility] EMERGENCY DEPARTMENT Provider Note   CSN: [id number] Arrival date & time: 05/13/21  1346     History Chief Complaint  Patient presents with   Abdominal Pain    N/v/d    [name] is a 80 y.o. female with history of hypertension, GERD, palpitations and hypothyroidism presenting for evaluation of a 4-day history of nausea and vomiting which has progressed to simple dry heaves along with copious nonbloody diarrhea, abdominal distention and generalized abdominal  pain.  She reports too numerous to count episodes of diarrhea described as watery with small amounts of brown stool fragments, stating she was up all night with diarrhea last night, but has had no diarrhea today.  In addition she has had vomiting with any attempted p.o. intake, she denies hematemesis.  She also denies fevers or chills.  She does endorse abdominal distention and some cramping pain which is been waxing and waning.  She denies any travel, she and her husband ate rotisserie chicken from a local grocery store last weekend, husband stated it tasted "different", patient ate just a small quantity, husband is symptom-free. No recent  antibiotic use.  She did take an OTC diarrhea medication yesterday which was apparently  effective since she has not had diarrhea since early this morning.   She denies fevers or chills, denies chest pain, shortness of breath back pain, dysuria.  At baseline she reports constipation for which she uses as needed MiraLAX.  She was not excessively constipated nor had she increased her dose of MiraLAX prior to onset of diarrhea.  The history is provided by the patient and the spouse.      Past Medical History:  Diagnosis Date   Anxiety states    Breast cyst    Cancer ([place])    skin   Cataract    Constipation    Esophageal stricture    Essential hypertension 12/18/2015   Fracture, rib    GERD (gastroesophageal reflux disease)    Hiatal hernia    Lower extremity edema 12/18/2015    Osteoporosis    Other and unspecified hyperlipidemia    PAC (premature atrial contraction) 12/30/2015   Palpitations 12/18/2015   Unspecified hypothyroidism    Vertebral fracture, osteoporotic (HCC)     Patient Active Problem List   Diagnosis Date Noted   Tibial plateau fracture, right, closed, initial encounter 07/17/2020   Prediabetes 02/25/2020   Chronic kidney disease (CKD) stage G3b/A1, moderately decreased glomerular filtration rate (GFR) between 30-44 mL/min/1.73 square meter and albuminuria creatinine ratio less than 30 mg/g (HCC) 02/22/2018   Atherosclerosis of aorta ([place]) 02/21/2018   PAC (premature atrial contraction) 12/30/2015   Essential hypertension 12/18/2015   Lower extremity edema 12/18/2015   Palpitations 12/18/2015   Hyperlipemia 02/14/2013   Hypothyroid 02/14/2013   Vitamin D deficiency 02/14/2013   Generalized anxiety disorder 02/14/2013   Constipation    Esophageal stricture    Osteoporosis with pathological fracture    GERD (gastroesophageal reflux disease)     Past Surgical History:  Procedure Laterality Date   ABDOMINAL HYSTERECTOMY     APPENDECTOMY     BREAST BIOPSY     bil cysts   CATARACT EXTRACTION     ESOPHAGOGASTRODUODENOSCOPY N/A 01/19/2018   Procedure: ESOPHAGOGASTRODUODENOSCOPY (EGD);  Surgeon: Clarene Essex, MD;  Location: [facility];  Service: Endoscopy;  Laterality: N/A;  with flouro   EYE SURGERY     HAND SURGERY Left    IR GENERIC HISTORICAL  12/10/2016   IR RADIOLOGIST  EVAL & MGMT 12/10/2016 [facility]   IR GENERIC HISTORICAL  12/23/2016   IR KYPHO THORACIC WITH BONE BIOPSY 12/23/2016 Luanne Bras, MD [facility]   PARTIAL HYSTERECTOMY     THYROID LOBECTOMY     right   VERTEBROPLASTY       OB History   No obstetric history on file.     Family History  Problem Relation Age of Onset   Goiter Father    Alcohol abuse Father    Heart disease Sister    Cancer Sister        breast and skin   Goiter Brother    Dementia  Brother    Hyperlipidemia Brother    Hernia Brother    Cancer Brother        throat   Cancer Brother        neck   Heart disease Brother    Early death Brother        auto accident    Social History   Tobacco Use   Smoking status: Former    Types: Cigarettes    Quit date: 11/18/2005    Years since quitting: 15.4   Smokeless tobacco: Never   Tobacco comments:    Non-smoker  quit 12-13 years  Vaping Use   Vaping Use: Never used  Substance Use Topics   Alcohol use: No   Drug use: No    Home Medications Prior to Admission medications   Medication Sig Start Date End Date Taking? Authorizing Provider  acetaminophen (TYLENOL) 500 MG tablet Take 500 mg by mouth every 8 (eight) hours as needed for mild pain or moderate pain.   Yes [provider]  atorvastatin (LIPITOR) 80 MG tablet TAKE 1 TABLET EVERY DAY Patient taking differently: Take 80 mg by mouth daily. 08/21/20  Yes Dettinger, Fransisca Kaufmann, MD  Calcium-Phosphorus-Vitamin D ([place]) [phone] MG-MG-UNIT CHEW Chew 1 each by mouth 2 (two) times daily. 02/16/17  Yes Cherre Robins, PharmD  Cholecalciferol (VITAMIN D3) 2000 units CHEW Chew 4,000 Units by mouth daily.   Yes [provider]  furosemide (LASIX) 20 MG tablet TAKE 1 TABLET EVERY DAY Patient taking differently: Take 10 mg by mouth daily. 10/03/20  Yes Dettinger, Fransisca Kaufmann, MD  levothyroxine (SYNTHROID) 100 MCG tablet TAKE 1/2 TABLET EVERY MONDAY, TUESDAY, [place], [place], AND FRIDAY. Patient taking differently: Take 50 mcg by mouth See admin instructions. Take one-half tablet on Mon, Tues, Wed, Thurs and Fri. None on Sat and Sun. 12/25/20  Yes Dettinger, Fransisca Kaufmann, MD  Multiple Vitamin (MULTIVITAMIN WITH MINERALS) TABS tablet Take 2 tablets by mouth daily.    Yes [provider]  omeprazole (PRILOSEC) 40 MG capsule Take 1 capsule (40 mg total) by mouth daily. 03/27/21  Yes Dettinger, Fransisca Kaufmann, MD  ondansetron (ZOFRAN ODT) 4 MG  disintegrating tablet Take 1 tablet (4 mg total) by mouth every 8 (eight) hours as needed for nausea or vomiting. 05/13/21  Yes Sharnelle Cappelli, Almyra Free, PA-C  oxazepam (SERAX) 15 MG capsule Take 1 capsule (15 mg total) by mouth 3 (three) times daily as needed for sleep. Cancel any other prescriptions on file 03/27/21  Yes Dettinger, Fransisca Kaufmann, MD  polyethylene glycol (MIRALAX / GLYCOLAX) packet Take 17 g by mouth daily as needed for moderate constipation.    Yes [provider]  potassium chloride 20 MEQ/15ML (10%) SOLN Take 7.5 mLs (10 mEq total) by mouth daily as needed (For cramping). 03/27/21  Yes Dettinger, Fransisca Kaufmann, MD  sucralfate (  CARAFATE) 1 g tablet Take 1 tablet (1 g total) by mouth 2 (two) times daily. 03/27/21  Yes Dettinger, Fransisca Kaufmann, MD  triamcinolone ointment (KENALOG) 0.5 % APPLY TO THE AFFECTED AREA(S) TOPICALLY TWICE DAILY Patient taking differently: Apply 1 application topically 2 (two) times daily. 11/24/20  Yes Dettinger, Fransisca Kaufmann, MD  azelastine (ASTELIN) 0.1 % nasal spray Place 1 spray into both nostrils 2 (two) times daily. Use in each nostril as directed Patient not taking: Reported on 05/13/2021 09/23/20   Dettinger, Fransisca Kaufmann, MD  hydrochlorothiazide (HYDRODIURIL) 25 MG tablet Take 1 tablet (25 mg total) by mouth daily. Patient not taking: Reported on 05/13/2021 03/27/21   Dettinger, Fransisca Kaufmann, MD  metoprolol tartrate (LOPRESSOR) 25 MG tablet Take 1 tablet (25 mg total) by mouth 2 (two) times daily as needed. Patient not taking: Reported on 05/13/2021 04/17/21   Dettinger, Fransisca Kaufmann, MD  mirtazapine (REMERON SOL-TAB) 15 MG disintegrating tablet Take 1 tablet (15 mg total) by mouth at bedtime. Patient not taking: Reported on 05/13/2021 03/27/21   Dettinger, Fransisca Kaufmann, MD  Misc. Devices (COMMODE BEDSIDE) MISC Bedside commode due to tibial fracture 07/17/20   Triplett, Tammy, PA-C  Misc. Devices [facility]) MISC Pt needs wheelchair due to extremity fractures 07/17/20   Triplett, Tammy, PA-C  Misc.  Devices MISC Use rolling walker to ambulate around the house as needed. 07/29/20   Mordecai Rasmussen, MD    Allergies    Bisphosphonates, Ciprofloxacin, Codeine, and Sulfa antibiotics  Review of Systems   Review of Systems  Constitutional:  Negative for fever.  HENT:  Negative for congestion and sore throat.   Eyes: Negative.   Respiratory:  Negative for chest tightness and shortness of breath.   Cardiovascular:  Negative for chest pain.  Gastrointestinal:  Positive for abdominal distention, abdominal pain, diarrhea, nausea and vomiting.  Genitourinary: Negative.   Musculoskeletal:  Negative for arthralgias, joint swelling and neck pain.  Skin: Negative.  Negative for rash and wound.  Neurological:  Negative for dizziness, weakness, light-headedness, numbness and headaches.  Psychiatric/Behavioral: Negative.     Physical Exam Updated Vital Signs BP 102/66   Pulse 95   Temp 99.1 F (37.3 C) (Oral)   Resp 20   Ht '5\' 1"'$  (1.549 m)   Wt 53.5 kg   SpO2 92%   BMI 22.30 kg/m   Physical Exam Vitals and nursing note reviewed.  Constitutional:      Appearance: She is well-developed.  HENT:     Head: Normocephalic and atraumatic.  Eyes:     Conjunctiva/sclera: Conjunctivae normal.  Cardiovascular:     Rate and Rhythm: Normal rate and regular rhythm.     Heart sounds: Normal heart sounds.  Pulmonary:     Effort: Pulmonary effort is normal.     Breath sounds: Normal breath sounds. No wheezing.  Abdominal:     General: Bowel sounds are normal. There is distension.     Palpations: Abdomen is soft.     Tenderness: There is abdominal tenderness in the left upper quadrant and left lower quadrant. There is no right CVA tenderness, left CVA tenderness, guarding or rebound.  Musculoskeletal:        General: Normal range of motion.     Cervical back: Normal range of motion.  Skin:    General: Skin is warm and dry.  Neurological:     Mental Status: She is alert.    ED Results /  Procedures / Treatments   Labs (all labs ordered  are listed, but only abnormal results are displayed) Labs Reviewed  COMPREHENSIVE METABOLIC PANEL - Abnormal; Notable for the following components:      Result Value   Sodium 134 (*)    Potassium 2.9 (*)    Glucose, Bld 115 (*)    Creatinine, Ser 1.24 (*)    Calcium 8.5 (*)    GFR, Estimated 43 (*)    All other components within normal limits  URINALYSIS, ROUTINE W REFLEX MICROSCOPIC - Abnormal; Notable for the following components:   Specific Gravity, Urine 1.033 (*)    All other components within normal limits  MAGNESIUM - Abnormal; Notable for the following components:   Magnesium 1.4 (*)    All other components within normal limits  C DIFFICILE QUICK SCREEN W PCR REFLEX    CBC WITH DIFFERENTIAL/PLATELET  LIPASE, BLOOD    EKG EKG Interpretation  Date/Time:  Wednesday May 13 2021 16:29:25 EDT Ventricular Rate:  100 PR Interval:  162 QRS Duration: 120 QT Interval:  403 QTC Calculation: 520 R Axis:   92 Text Interpretation: Sinus tachycardia Supraventricular bigeminy Nonspecific intraventricular conduction delay Minimal ST depression Since last tracing atrial rhythm abnormality is new Otherwise no significant change Confirmed by Daleen Bo [phone]) on 05/13/2021 4:38:45 PM  Radiology CT ABDOMEN PELVIS W CONTRAST  Result Date: 05/13/2021 CLINICAL DATA:  80 year old female with abdominal pain and distension. EXAM: CT ABDOMEN AND PELVIS WITH CONTRAST TECHNIQUE: Multidetector CT imaging of the abdomen and pelvis was performed using the standard protocol following bolus administration of intravenous contrast. CONTRAST:  22m OMNIPAQUE IOHEXOL 300 MG/ML  SOLN COMPARISON:  CT abdomen pelvis dated 04/19/2020. FINDINGS: Lower chest: The visualized lung bases are clear. There is coronary vascular calcification. No intra-abdominal free air or free fluid. Hepatobiliary: No focal liver abnormality is seen. No gallstones, gallbladder wall  thickening, or biliary dilatation. Pancreas: Unremarkable. No pancreatic ductal dilatation or surrounding inflammatory changes. Spleen: Normal in size without focal abnormality. Adrenals/Urinary Tract: The adrenal glands are unremarkable. There is no hydronephrosis on either side. There is symmetric enhancement and excretion of contrast by both kidneys. The visualized ureters and urinary bladder appear unremarkable. Stomach/Bowel: There is loose stool throughout the colon compatible with diarrheal state. Correlation with clinical exam and stool cultures recommended. There is no bowel obstruction or active inflammation. The appendix is normal. Vascular/Lymphatic: Advanced aortoiliac atherosclerotic disease. The IVC is unremarkable. No portal venous gas. There is no adenopathy. Reproductive: Hysterectomy. No adnexal masses. Other: There is minimal focal protrusion of a distal small bowel into the right inguinal canal without evidence of obstruction. Musculoskeletal: Osteopenia with degenerative changes of the spine. Old lumbar compression fractures. No acute osseous pathology. IMPRESSION: Diarrheal state. Correlation with clinical exam and stool cultures recommended. No bowel obstruction. Normal appendix. Electronically Signed   By: AAnner CreteM.D.   On: 05/13/2021 18:22    Procedures Procedures   Medications Ordered in ED Medications  sodium chloride 0.9 % bolus 1,000 mL (0 mLs Intravenous Stopped 05/13/21 1650)  ondansetron (ZOFRAN) injection 4 mg (4 mg Intravenous Given 05/13/21 1611)  morphine 2 MG/ML injection 2 mg (2 mg Intravenous Given 05/13/21 1611)  potassium chloride 10 mEq in 100 mL IVPB (0 mEq Intravenous Stopped 05/13/21 1936)  potassium chloride SA (KLOR-CON) CR tablet 40 mEq (40 mEq Oral Given 05/13/21 1747)  iohexol (OMNIPAQUE) 300 MG/ML solution 75 mL (75 mLs Intravenous Contrast Given 05/13/21 1724)  magnesium sulfate IVPB 2 g 50 mL (0 g Intravenous Stopped 05/13/21 2039)  ED Course   I have reviewed the triage vital signs and the nursing notes.  Pertinent labs & imaging results that were available during my care of the patient were reviewed by me and considered in my medical decision making (see chart for details).    MDM Rules/Calculators/A&P                           Labs and imaging reviewed and discussed with patient and her husband at the bedside.  Her CT scan is reassuring with no signs of intra-abdominal processes such as obstruction or constipation with overflow diarrhea.  I suspect that she has a viral gastroenteritis.  She was given IV fluids.  Additionally it was found that she had a pretty significant hypokalemia at 2.9.  She was given both IV and p.o. supplementation.  Her magnesium is also low at 1.4, she was given an IV dose to replace this abnormality as well.  She was felt stable for discharge home.  She was advised to continue taking her Imodium if she continues to have diarrhea, of note she has had no diarrhea or vomiting while here for this visit.  She has p.o. potassium already prescribed to her which she takes on a as needed basis, she was advised to take this twice daily for the next week to continue replacement of her potassium.  She was prescribed Zofran in case her nausea returns.  She has no fevers, no recent antibiotic use, doubt this represents C. difficile or other bacterial diarrhea.  Patient was advised close follow-up with her PCP if return or worsen. Final Clinical Impression(s) / ED Diagnoses Final diagnoses:  Nausea vomiting and diarrhea  Hypokalemia  Gastroenteritis    Rx / DC Orders ED Discharge Orders          Ordered    ondansetron (ZOFRAN ODT) 4 MG disintegrating tablet  Every 8 hours PRN        05/13/21 2139             Evalee Jefferson, PA-C 05/14/21 0026    Daleen Bo, MD 05/14/21 [phone]

## 2021-05-13 NOTE — Discharge Instructions (Addendum)
Your lab tests, history, exam and CT imaging are reassuring today.  I suspect you have a viral gastroenteritis which should run its course.  You may continue taking your Imodium but only continue using this medication if you continue to have diarrhea.  Since you have had no diarrhea today it is reassuring that the symptoms may have finally resolved.  You may take the Zofran prescribed if needed for continued nausea.  You have a liquid potassium medication that was prescribed to you for as needed use for muscle cramping, this was prescribed by your primary doctor.  I recommend taking this medication twice daily for the next 7 days to continue to replace your potassium level which is reduced from your diarrhea and vomiting.  Plan to see Dr. Warrick Parisian for recheck if your symptoms are not completely resolving over the next several days.  It is reassuring that you have had no vomiting or diarrhea while here.

## 2021-05-13 NOTE — ED Triage Notes (Signed)
Pt c/o generalized abdominal pain x 6 days with n/v/d

## 2021-06-08 DIAGNOSIS — L570 Actinic keratosis: Secondary | ICD-10-CM | POA: Diagnosis not present

## 2021-07-09 ENCOUNTER — Encounter: Payer: Self-pay | Admitting: Family

## 2021-07-09 ENCOUNTER — Telehealth: Payer: Self-pay | Admitting: Family Medicine

## 2021-07-09 ENCOUNTER — Ambulatory Visit (INDEPENDENT_AMBULATORY_CARE_PROVIDER_SITE_OTHER): Payer: Medicare HMO | Admitting: Family

## 2021-07-09 ENCOUNTER — Telehealth: Payer: Self-pay | Admitting: Family

## 2021-07-09 DIAGNOSIS — R059 Cough, unspecified: Secondary | ICD-10-CM | POA: Diagnosis not present

## 2021-07-09 MED ORDER — MOLNUPIRAVIR EUA 200MG CAPSULE: 4.0000 | ORAL_CAPSULE | Freq: Two times a day (BID) | ORAL | 0 refills | Status: AC

## 2021-07-09 NOTE — Telephone Encounter (Signed)
Patient aware and verbalizes understanding. 

## 2021-07-09 NOTE — Telephone Encounter (Signed)
Pt called to let Alyse Low know that she tested positive for COVID with home test.

## 2021-07-09 NOTE — Telephone Encounter (Signed)
FYI Had a visit with Alyse Low today

## 2021-07-09 NOTE — Telephone Encounter (Signed)
Molnupiravir Prescription sent to [facility].  If symptoms worsen need to be seen in person.

## 2021-07-09 NOTE — Progress Notes (Signed)
   Virtual Visit  Note Due to COVID-19 pandemic this visit was conducted virtually. This visit type was conducted due to national recommendations for restrictions regarding the COVID-19 Pandemic (e.g. social distancing, sheltering in place) in an effort to limit this patient's exposure and mitigate transmission in our community. All issues noted in this document were discussed and addressed.  A physical exam was not performed with this format.  I connected with [name] on 07/09/21 at 10:58 AM by telephone and verified that I am speaking with the correct person using two identifiers. [name] is currently located at home and no one is currently with her during visit. The provider, Evelina Dun, FNP is located in their office at time of visit.  I discussed the limitations, risks, security and privacy concerns of performing an evaluation and management service by telephone and the availability of in person appointments. I also discussed with the patient that there may be a patient responsible charge related to this service. The patient expressed understanding and agreed to proceed.   History and Present Illness:  PT calls the office with a cough that started 4-5 days ago.  Cough This is a new problem. Episode onset: 5 days. The problem has been waxing and waning. The problem occurs every few minutes. The cough is Non-productive. Associated symptoms include headaches, myalgias and nasal congestion. Pertinent negatives include no chills, ear congestion, ear pain, fever, sore throat, shortness of breath or wheezing. She has tried rest for the symptoms. The treatment provided mild relief.     Review of Systems  Constitutional:  Negative for chills and fever.  HENT:  Negative for ear pain and sore throat.   Respiratory:  Positive for cough. Negative for shortness of breath and wheezing.   Musculoskeletal:  Positive for myalgias.  Neurological:  Positive for headaches.     Observations/Objective: No SOB or distress noted, hoarse voice   Assessment and Plan: 1. Cough Pt will take home COVID test and call office and let us know.  If positive will send in molnupiravir Force fluids Rest Tylenol as needed If symptoms worsen go to ED    I discussed the assessment and treatment plan with the patient. The patient was provided an opportunity to ask questions and all were answered. The patient agreed with the plan and demonstrated an understanding of the instructions.   The patient was advised to call back or seek an in-person evaluation if the symptoms worsen or if the condition fails to improve as anticipated.  The above assessment and management plan was discussed with the patient. The patient verbalized understanding of and has agreed to the management plan. Patient is aware to call the clinic if symptoms persist or worsen. Patient is aware when to return to the clinic for a follow-up visit. Patient educated on when it is appropriate to go to the emergency department.   Time call ended:  1:09 AM   I provided 11 minutes of  non face-to-face time during this encounter.    Evelina Dun, FNP

## 2021-07-09 NOTE — Telephone Encounter (Signed)
Rx was never sent. Covid test was positive per pt. Sent in [place]  to [place] pt pt aware.  She states that she may have a hard time swallowing the capsules. If so she just will not take them. Instructed pt to call back if she is not any better for an in office visit.

## 2021-07-23 ENCOUNTER — Other Ambulatory Visit: Payer: Self-pay | Admitting: Family Medicine

## 2021-07-27 ENCOUNTER — Encounter: Payer: Self-pay | Admitting: Family Medicine

## 2021-07-27 ENCOUNTER — Other Ambulatory Visit: Payer: Self-pay

## 2021-07-27 ENCOUNTER — Ambulatory Visit (INDEPENDENT_AMBULATORY_CARE_PROVIDER_SITE_OTHER): Payer: Medicare HMO | Admitting: Family Medicine

## 2021-07-27 VITALS — BP 135/78 | HR 81 | Ht 61.0 in | Wt 115.0 lb

## 2021-07-27 DIAGNOSIS — E782 Mixed hyperlipidemia: Secondary | ICD-10-CM

## 2021-07-27 DIAGNOSIS — Z23 Encounter for immunization: Secondary | ICD-10-CM

## 2021-07-27 DIAGNOSIS — K219 Gastro-esophageal reflux disease without esophagitis: Secondary | ICD-10-CM

## 2021-07-27 DIAGNOSIS — I10 Essential (primary) hypertension: Secondary | ICD-10-CM

## 2021-07-27 DIAGNOSIS — R7303 Prediabetes: Secondary | ICD-10-CM | POA: Diagnosis not present

## 2021-07-27 DIAGNOSIS — N1832 Chronic kidney disease, stage 3b: Secondary | ICD-10-CM | POA: Diagnosis not present

## 2021-07-27 DIAGNOSIS — F411 Generalized anxiety disorder: Secondary | ICD-10-CM

## 2021-07-27 DIAGNOSIS — E034 Atrophy of thyroid (acquired): Secondary | ICD-10-CM | POA: Diagnosis not present

## 2021-07-27 MED ORDER — OMEPRAZOLE 40 MG PO CPDR: 40.0000 mg | DELAYED_RELEASE_CAPSULE | Freq: Every day | ORAL | 3 refills | Status: DC

## 2021-07-27 MED ORDER — METOPROLOL TARTRATE 25 MG PO TABS: 25.0000 mg | ORAL_TABLET | Freq: Two times a day (BID) | ORAL | 3 refills | Status: DC | PRN

## 2021-07-27 MED ORDER — OXAZEPAM 15 MG PO CAPS: 15.0000 mg | ORAL_CAPSULE | Freq: Three times a day (TID) | ORAL | 3 refills | Status: DC | PRN

## 2021-07-27 NOTE — Progress Notes (Signed)
BP 135/78   Pulse 81   Ht 5' 1"  (1.549 m)   Wt 115 lb (52.2 kg)   SpO2 99%   BMI 21.73 kg/m    Subjective:   Patient ID: [name], female    DOB: Sep 27, 1941, 80 y.o.   MRN: [id number]  HPI: [name] is a 80 y.o. female presenting on 07/27/2021 for Medical Management of Chronic Issues, Hyperlipidemia, and Hypothyroidism   HPI Anxiety recheck Current rx-oxazepam 15 mg 3 times daily as needed # meds rx-90 Effectiveness of current meds-works well, helps a lot with her anxiety Adverse reactions form meds-none  Pill count performed-No Last drug screen -12/22/2020 ( high risk q39m moderate risk q636mlow risk yearly ) Urine drug screen today- No [place]  If yes were their any concerning findings? -None  No flowsheet data found.   Controlled substance contract signed on: 12/23/2018  Hypertension Patient is currently on furosemide and hydrochlorothiazide and metoprolol as needed, and their blood pressure today is 135/78. Patient denies any lightheadedness or dizziness. Patient denies headaches, blurred vision, chest pains, shortness of breath, or weakness. Denies any side effects from medication and is content with current medication.   Hyperlipidemia Patient is coming in for recheck of his hyperlipidemia. The patient is currently taking atorvastatin. They deny any issues with myalgias or history of liver damage from it. They deny any focal numbness or weakness or chest pain.   Hypothyroidism recheck Patient is coming in for thyroid recheck today as well. They deny any issues with hair changes or heat or cold problems or diarrhea or constipation. They deny any chest pain or palpitations. They are currently on levothyroxine 100 micrograms   CKD stage III Patient is coming for recheck of CKD stage III, its been stable, we will see where she is at today.  She denies any symptoms or urinary symptoms.  Relevant past medical, surgical, family and social  history reviewed and updated as indicated. Interim medical history since our last visit reviewed. Allergies and medications reviewed and updated.  Review of Systems  Constitutional:  Negative for chills and fever.  Eyes:  Negative for visual disturbance.  Respiratory:  Negative for chest tightness and shortness of breath.   Cardiovascular:  Negative for chest pain and leg swelling.  Genitourinary:  Negative for difficulty urinating and dysuria.  Musculoskeletal:  Negative for arthralgias, back pain and gait problem.  Skin:  Negative for rash.  Neurological:  Negative for dizziness, light-headedness and headaches.  Psychiatric/Behavioral:  Positive for sleep disturbance. Negative for agitation and behavioral problems. The patient is nervous/anxious.   All other systems reviewed and are negative.  Per HPI unless specifically indicated above   Allergies as of 07/27/2021       Reactions   Bisphosphonates Other (See Comments)   Difficulty swallowing   Ciprofloxacin Nausea And Vomiting   Codeine Nausea And Vomiting   Sulfa Antibiotics Nausea And Vomiting        Medication List        Accurate as of July 27, 2021 11:32 AM. If you have any questions, ask your nurse or doctor.          acetaminophen 500 MG tablet Commonly known as: TYLENOL Take 500 mg by mouth every 8 (eight) hours as needed for mild pain or moderate pain.   atorvastatin 80 MG tablet Commonly known as: LIPITOR TAKE 1 TABLET EVERY DAY   azelastine 0.1 % nasal spray Commonly known as: ASTELIN Place  1 spray into both nostrils 2 (two) times daily. Use in each nostril as directed   Calcium-Phosphorus-Vitamin D 250-115-250 MG-MG-UNIT Chew Commonly known as: Citracal Calcium Gummies Chew 1 each by mouth 2 (two) times daily.   Commode Bedside Misc Bedside commode due to tibial fracture   Wheelchair Misc Pt needs wheelchair due to extremity fractures   Misc. Devices Misc Use rolling walker to ambulate  around the house as needed.   furosemide 20 MG tablet Commonly known as: LASIX TAKE 1 TABLET EVERY DAY What changed: how much to take   hydrochlorothiazide 25 MG tablet Commonly known as: HYDRODIURIL Take 1 tablet (25 mg total) by mouth daily.   levothyroxine 100 MCG tablet Commonly known as: SYNTHROID TAKE 1/2 TABLET EVERY MONDAY, TUESDAY, WEDNESDAY, THURSDAY, AND FRIDAY. What changed: See the new instructions.   metoprolol tartrate 25 MG tablet Commonly known as: LOPRESSOR Take 1 tablet (25 mg total) by mouth 2 (two) times daily as needed.   mirtazapine 15 MG disintegrating tablet Commonly known as: REMERON SOL-TAB Take 1 tablet (15 mg total) by mouth at bedtime.   multivitamin with minerals Tabs tablet Take 2 tablets by mouth daily.   omeprazole 40 MG capsule Commonly known as: PRILOSEC Take 1 capsule (40 mg total) by mouth daily.   ondansetron 4 MG disintegrating tablet Commonly known as: Zofran ODT Take 1 tablet (4 mg total) by mouth every 8 (eight) hours as needed for nausea or vomiting.   oxazepam 15 MG capsule Commonly known as: SERAX Take 1 capsule (15 mg total) by mouth 3 (three) times daily as needed for sleep. Cancel any other prescriptions on file   polyethylene glycol 17 g packet Commonly known as: MIRALAX / GLYCOLAX Take 17 g by mouth daily as needed for moderate constipation.   potassium chloride 20 MEQ/15ML (10%) Soln Take 7.5 mLs (10 mEq total) by mouth daily as needed (For cramping).   sucralfate 1 g tablet Commonly known as: CARAFATE Take 1 tablet (1 g total) by mouth 2 (two) times daily.   triamcinolone ointment 0.5 % Commonly known as: KENALOG APPLY TO THE AFFECTED AREA(S) TOPICALLY TWICE DAILY What changed: See the new instructions.   Vitamin D3 50 MCG (2000 UT) Chew Chew 4,000 Units by mouth daily.         Objective:   BP 135/78   Pulse 81   Ht 5' 1"  (1.549 m)   Wt 115 lb (52.2 kg)   SpO2 99%   BMI 21.73 kg/m   Wt  Readings from Last 3 Encounters:  07/27/21 115 lb (52.2 kg)  05/13/21 118 lb (53.5 kg)  03/27/21 118 lb (53.5 kg)    Physical Exam Vitals and nursing note reviewed.  Constitutional:      General: She is not in acute distress.    Appearance: She is well-developed. She is not diaphoretic.  Eyes:     Conjunctiva/sclera: Conjunctivae normal.  Cardiovascular:     Rate and Rhythm: Normal rate and regular rhythm.     Heart sounds: Normal heart sounds. No murmur heard. Pulmonary:     Effort: Pulmonary effort is normal. No respiratory distress.     Breath sounds: Normal breath sounds. No wheezing.  Musculoskeletal:        General: No tenderness. Normal range of motion.  Skin:    General: Skin is warm and dry.     Findings: No rash.  Neurological:     Mental Status: She is alert and oriented to person, place, and  time.     Coordination: Coordination normal.  Psychiatric:        Behavior: Behavior normal.      Assessment & Plan:   Problem List Items Addressed This Visit       Cardiovascular and Mediastinum   Essential hypertension - Primary   Relevant Medications   metoprolol tartrate (LOPRESSOR) 25 MG tablet   Other Relevant Orders   CMP14+EGFR     Digestive   GERD (gastroesophageal reflux disease)   Relevant Medications   omeprazole (PRILOSEC) 40 MG capsule   Other Relevant Orders   CBC with Differential/Platelet     Endocrine   Hypothyroid   Relevant Medications   metoprolol tartrate (LOPRESSOR) 25 MG tablet   Other Relevant Orders   TSH     Genitourinary   Chronic kidney disease (CKD) stage G3b/A1, moderately decreased glomerular filtration rate (GFR) between 30-44 mL/min/1.73 square meter and albuminuria creatinine ratio less than 30 mg/g (HCC)     Other   Hyperlipemia   Relevant Medications   metoprolol tartrate (LOPRESSOR) 25 MG tablet   Other Relevant Orders   Lipid panel   Generalized anxiety disorder   Relevant Medications   oxazepam (SERAX) 15 MG  capsule   Prediabetes   Relevant Orders   Lipid panel   Other Visit Diagnoses     Need for immunization against influenza       Relevant Orders   Flu Vaccine QUAD High Dose(Fluad) (Completed)       Continue current medicine, will do blood work today. Follow up plan: Return in about 4 months (around 11/27/2021), or if symptoms worsen or fail to improve, for Anxiety and hypertension and prediabetes.  Counseling provided for all of the vaccine components Orders Placed This Encounter  Procedures   Flu Vaccine QUAD High Dose(Fluad)   CBC with Differential/Platelet   CMP14+EGFR   Lipid panel   TSH    Caryl Pina, MD [facility] 07/27/2021, 11:32 AM

## 2021-07-28 LAB — CBC WITH DIFFERENTIAL/PLATELET
Basophils Absolute: 0.1 10*3/uL (ref 0.0–0.2)
Basos: 1 %
EOS (ABSOLUTE): 0.1 10*3/uL (ref 0.0–0.4)
Eos: 2 %
Hematocrit: 37.8 % (ref 34.0–46.6)
Hemoglobin: 12.2 g/dL (ref 11.1–15.9)
Immature Grans (Abs): 0 10*3/uL (ref 0.0–0.1)
Immature Granulocytes: 0 %
Lymphocytes Absolute: 1.7 10*3/uL (ref 0.7–3.1)
Lymphs: 25 %
MCH: 30.4 pg (ref 26.6–33.0)
MCHC: 32.3 g/dL (ref 31.5–35.7)
MCV: 94 fL (ref 79–97)
Monocytes Absolute: 0.6 10*3/uL (ref 0.1–0.9)
Monocytes: 9 %
Neutrophils Absolute: 4.1 10*3/uL (ref 1.4–7.0)
Neutrophils: 63 %
Platelets: 317 10*3/uL (ref 150–450)
RBC: 4.01 x10E6/uL (ref 3.77–5.28)
RDW: 12.6 % (ref 11.7–15.4)
WBC: 6.6 10*3/uL (ref 3.4–10.8)

## 2021-07-28 LAB — CMP14+EGFR
ALT: 7 IU/L (ref 0–32)
AST: 17 IU/L (ref 0–40)
Albumin/Globulin Ratio: 1.5 (ref 1.2–2.2)
Albumin: 4.1 g/dL (ref 3.6–4.6)
Alkaline Phosphatase: 116 IU/L (ref 44–121)
BUN/Creatinine Ratio: 11 — ABNORMAL LOW (ref 12–28)
BUN: 12 mg/dL (ref 8–27)
Bilirubin Total: 0.5 mg/dL (ref 0.0–1.2)
CO2: 23 mmol/L (ref 20–29)
Calcium: 9.2 mg/dL (ref 8.7–10.3)
Chloride: 104 mmol/L (ref 96–106)
Creatinine, Ser: 1.11 mg/dL — ABNORMAL HIGH (ref 0.57–1.00)
Globulin, Total: 2.8 g/dL (ref 1.5–4.5)
Glucose: 89 mg/dL (ref 70–99)
Potassium: 4 mmol/L (ref 3.5–5.2)
Sodium: 143 mmol/L (ref 134–144)
Total Protein: 6.9 g/dL (ref 6.0–8.5)
eGFR: 50 mL/min/{1.73_m2} — ABNORMAL LOW (ref >59)

## 2021-07-28 LAB — LIPID PANEL
Chol/HDL Ratio: 3.9 ratio (ref 0.0–4.4)
Cholesterol, Total: 154 mg/dL (ref 100–199)
HDL: 39 mg/dL — ABNORMAL LOW (ref >39)
LDL Chol Calc (NIH): 87 mg/dL (ref 0–99)
Triglycerides: 160 mg/dL — ABNORMAL HIGH (ref 0–149)
VLDL Cholesterol Cal: 28 mg/dL (ref 5–40)

## 2021-07-28 LAB — TSH: TSH: 0.28 u[IU]/mL — ABNORMAL LOW (ref 0.450–4.500)

## 2021-07-30 ENCOUNTER — Other Ambulatory Visit: Payer: Self-pay

## 2021-07-30 DIAGNOSIS — E034 Atrophy of thyroid (acquired): Secondary | ICD-10-CM

## 2021-09-16 DIAGNOSIS — L570 Actinic keratosis: Secondary | ICD-10-CM | POA: Diagnosis not present

## 2021-09-29 ENCOUNTER — Other Ambulatory Visit: Payer: Medicare HMO

## 2021-09-29 DIAGNOSIS — E034 Atrophy of thyroid (acquired): Secondary | ICD-10-CM

## 2021-09-30 LAB — TSH: TSH: 0.197 u[IU]/mL — ABNORMAL LOW (ref 0.450–4.500)

## 2021-10-01 ENCOUNTER — Other Ambulatory Visit: Payer: Self-pay

## 2021-10-01 MED ORDER — LEVOTHYROXINE SODIUM 88 MCG PO TABS: 88.0000 ug | ORAL_TABLET | Freq: Every day | ORAL | 1 refills | Status: DC

## 2021-10-30 ENCOUNTER — Other Ambulatory Visit: Payer: Self-pay | Admitting: Family Medicine

## 2021-10-30 DIAGNOSIS — F411 Generalized anxiety disorder: Secondary | ICD-10-CM

## 2021-11-27 ENCOUNTER — Ambulatory Visit (INDEPENDENT_AMBULATORY_CARE_PROVIDER_SITE_OTHER): Payer: Medicare HMO | Admitting: Family Medicine

## 2021-11-27 ENCOUNTER — Encounter: Payer: Self-pay | Admitting: Family Medicine

## 2021-11-27 VITALS — BP 131/76 | HR 72 | Wt 117.0 lb

## 2021-11-27 DIAGNOSIS — E034 Atrophy of thyroid (acquired): Secondary | ICD-10-CM

## 2021-11-27 DIAGNOSIS — I10 Essential (primary) hypertension: Secondary | ICD-10-CM | POA: Diagnosis not present

## 2021-11-27 DIAGNOSIS — R7303 Prediabetes: Secondary | ICD-10-CM

## 2021-11-27 DIAGNOSIS — R634 Abnormal weight loss: Secondary | ICD-10-CM

## 2021-11-27 DIAGNOSIS — N1832 Chronic kidney disease, stage 3b: Secondary | ICD-10-CM

## 2021-11-27 DIAGNOSIS — R630 Anorexia: Secondary | ICD-10-CM | POA: Diagnosis not present

## 2021-11-27 DIAGNOSIS — F411 Generalized anxiety disorder: Secondary | ICD-10-CM | POA: Diagnosis not present

## 2021-11-27 DIAGNOSIS — E782 Mixed hyperlipidemia: Secondary | ICD-10-CM | POA: Diagnosis not present

## 2021-11-27 DIAGNOSIS — Z23 Encounter for immunization: Secondary | ICD-10-CM

## 2021-11-27 LAB — BAYER DCA HB A1C WAIVED: HB A1C (BAYER DCA - WAIVED): 5.6 % (ref 4.8–5.6)

## 2021-11-27 MED ORDER — MIRTAZAPINE 15 MG PO TBDP: 15.0000 mg | ORAL_TABLET | Freq: Every day | ORAL | 3 refills | Status: DC

## 2021-11-27 MED ORDER — FUROSEMIDE 20 MG PO TABS: 20.0000 mg | ORAL_TABLET | Freq: Every day | ORAL | 3 refills | Status: DC

## 2021-11-27 MED ORDER — OXAZEPAM 15 MG PO CAPS: 15.0000 mg | ORAL_CAPSULE | Freq: Three times a day (TID) | ORAL | 3 refills | Status: DC | PRN

## 2021-11-27 NOTE — Progress Notes (Signed)
BP 131/76    Pulse 72    Wt 117 lb (53.1 kg)    SpO2 96%    BMI 22.11 kg/m    Subjective:   Patient ID: [name], female    DOB: [birth year], 81 y.o.   MRN: [id number]  HPI: [name] is a 81 y.o. female presenting on 11/27/2021 for Medical Management of Chronic Issues, Hyperlipidemia, Hypertension, and decreased appetitie   HPI Hypothyroidism recheck Patient is coming in for thyroid recheck today as well. They deny any issues with hair changes or heat or cold problems or diarrhea or constipation. They deny any chest pain or palpitations. They are currently on levothyroxine 88 micrograms   Hypertension Patient is currently on hydrochlorothiazide and metoprolol, and their blood pressure today is 131/76. Patient denies any lightheadedness or dizziness. Patient denies headaches, blurred vision, chest pains, shortness of breath, or weakness. Denies any side effects from medication and is content with current medication.   Hyperlipidemia Patient is coming in for recheck of his hyperlipidemia. The patient is currently taking atorvastatin. They deny any issues with myalgias or history of liver damage from it. They deny any focal numbness or weakness or chest pain.   Anxiety recheck Current rx-oxazepam 15 mg 3 times daily as needed # meds rx-90 Effectiveness of current meds-works well Adverse reactions form meds-memory is fading recently but do not know if that is from the medicine or just fading  Pill count performed-No Last drug screen -12/22/2020 ( high risk q29m moderate risk q67mlow risk yearly ) Urine drug screen today- Yes [place]  If yes were their any concerning findings? -None  No flowsheet data found.   Controlled substance contract signed on: Today  Relevant past medical, surgical, family and social history reviewed and updated as indicated. Interim medical history since our last visit reviewed. Allergies and medications reviewed and  updated.  Review of Systems  Constitutional:  Negative for chills and fever.  Eyes:  Negative for visual disturbance.  Respiratory:  Negative for chest tightness and shortness of breath.   Cardiovascular:  Negative for chest pain and leg swelling.  Musculoskeletal:  Negative for back pain and gait problem.  Skin:  Negative for rash.  Neurological:  Negative for dizziness, light-headedness and headaches.  Psychiatric/Behavioral:  Negative for agitation and behavioral problems.   All other systems reviewed and are negative.  Per HPI unless specifically indicated above   Allergies as of 11/27/2021       Reactions   Bisphosphonates Other (See Comments)   Difficulty swallowing   Ciprofloxacin Nausea And Vomiting   Codeine Nausea And Vomiting   Sulfa Antibiotics Nausea And Vomiting        Medication List        Accurate as of November 27, 2021 11:50 AM. If you have any questions, ask your nurse or doctor.          acetaminophen 500 MG tablet Commonly known as: TYLENOL Take 500 mg by mouth every 8 (eight) hours as needed for mild pain or moderate pain.   atorvastatin 80 MG tablet Commonly known as: LIPITOR TAKE 1 TABLET EVERY DAY   azelastine 0.1 % nasal spray Commonly known as: ASTELIN Place 1 spray into both nostrils 2 (two) times daily. Use in each nostril as directed   Calcium-Phosphorus-Vitamin D 250-115-250 MG-MG-UNIT Chew Commonly known as: Citracal Calcium Gummies Chew 1 each by mouth 2 (two) times daily.   Commode Bedside Misc Bedside commode due  to tibial fracture   Wheelchair Misc Pt needs wheelchair due to extremity fractures   Misc. Devices Misc Use rolling walker to ambulate around the house as needed.   furosemide 20 MG tablet Commonly known as: LASIX Take 1 tablet (20 mg total) by mouth daily. What changed: how much to take   hydrochlorothiazide 25 MG tablet Commonly known as: HYDRODIURIL Take 1 tablet (25 mg total) by mouth daily.    levothyroxine 88 MCG tablet Commonly known as: SYNTHROID Take 1 tablet (88 mcg total) by mouth daily.   metoprolol tartrate 25 MG tablet Commonly known as: LOPRESSOR Take 1 tablet (25 mg total) by mouth 2 (two) times daily as needed.   mirtazapine 15 MG disintegrating tablet Commonly known as: REMERON SOL-TAB Take 1 tablet (15 mg total) by mouth at bedtime.   multivitamin with minerals Tabs tablet Take 2 tablets by mouth daily.   omeprazole 40 MG capsule Commonly known as: PRILOSEC Take 1 capsule (40 mg total) by mouth daily.   ondansetron 4 MG disintegrating tablet Commonly known as: Zofran ODT Take 1 tablet (4 mg total) by mouth every 8 (eight) hours as needed for nausea or vomiting.   oxazepam 15 MG capsule Commonly known as: SERAX Take 1 capsule (15 mg total) by mouth 3 (three) times daily as needed for sleep. Cancel any other prescriptions on file   polyethylene glycol 17 g packet Commonly known as: MIRALAX / GLYCOLAX Take 17 g by mouth daily as needed for moderate constipation.   potassium chloride 20 MEQ/15ML (10%) Soln Take 7.5 mLs (10 mEq total) by mouth daily as needed (For cramping).   sucralfate 1 g tablet Commonly known as: CARAFATE Take 1 tablet (1 g total) by mouth 2 (two) times daily.   triamcinolone ointment 0.5 % Commonly known as: KENALOG APPLY TO THE AFFECTED AREA(S) TOPICALLY TWICE DAILY What changed: See the new instructions.   Vitamin D3 50 MCG (2000 UT) Chew Chew 4,000 Units by mouth daily.         Objective:   BP 131/76    Pulse 72    Wt 117 lb (53.1 kg)    SpO2 96%    BMI 22.11 kg/m   Wt Readings from Last 3 Encounters:  11/27/21 117 lb (53.1 kg)  07/27/21 115 lb (52.2 kg)  05/13/21 118 lb (53.5 kg)    Physical Exam Vitals and nursing note reviewed.  Constitutional:      General: She is not in acute distress.    Appearance: She is well-developed. She is not diaphoretic.  Eyes:     Conjunctiva/sclera: Conjunctivae normal.   Cardiovascular:     Rate and Rhythm: Normal rate and regular rhythm.     Heart sounds: Normal heart sounds. No murmur heard. Pulmonary:     Effort: Pulmonary effort is normal. No respiratory distress.     Breath sounds: Normal breath sounds. No wheezing.  Musculoskeletal:        General: No swelling or tenderness. Normal range of motion.  Skin:    General: Skin is warm and dry.     Findings: No rash.  Neurological:     Mental Status: She is alert and oriented to person, place, and time.     Coordination: Coordination normal.  Psychiatric:        Behavior: Behavior normal.      Assessment & Plan:   Problem List Items Addressed This Visit       Cardiovascular and Mediastinum   Essential hypertension  Relevant Medications   furosemide (LASIX) 20 MG tablet   Other Relevant Orders   CBC with Differential/Platelet   CMP14+EGFR     Endocrine   Hypothyroid   Relevant Orders   TSH     Genitourinary   Chronic kidney disease (CKD) stage G3b/A1, moderately decreased glomerular filtration rate (GFR) between 30-44 mL/min/1.73 square meter and albuminuria creatinine ratio less than 30 mg/g (HCC)   Relevant Orders   CBC with Differential/Platelet   CMP14+EGFR     Other   Hyperlipemia - Primary   Relevant Medications   furosemide (LASIX) 20 MG tablet   Other Relevant Orders   CBC with Differential/Platelet   Lipid panel   Generalized anxiety disorder   Relevant Medications   mirtazapine (REMERON SOL-TAB) 15 MG disintegrating tablet   oxazepam (SERAX) 15 MG capsule   Other Relevant Orders   Drug Screen 10 W/Conf, Se   Prediabetes   Relevant Orders   Bayer DCA Hb A1c Waived   Other Visit Diagnoses     Weight loss, unintentional       Relevant Medications   mirtazapine (REMERON SOL-TAB) 15 MG disintegrating tablet   Decreased appetite       Relevant Medications   mirtazapine (REMERON SOL-TAB) 15 MG disintegrating tablet   Need for shingles vaccine       Relevant  Orders   Varicella-zoster vaccine IM (Shingrix) (Completed)   Need for Tdap vaccination           Continue current medicine, will check blood work. Follow up plan: Return in about 4 months (around 03/27/2022), or if symptoms worsen or fail to improve, for Hypothyroidism and hypertension and hyperlipidemia.  Counseling provided for all of the vaccine components Orders Placed This Encounter  Procedures   Varicella-zoster vaccine IM (Shingrix)   CBC with Differential/Platelet   CMP14+EGFR   Lipid panel   TSH   Bayer DCA Hb A1c Waived   Drug Screen 10 W/Conf, Se    Caryl Pina, MD [facility] 11/27/2021, 11:50 AM

## 2021-12-01 LAB — LIPID PANEL

## 2021-12-07 LAB — CBC WITH DIFFERENTIAL/PLATELET
Basophils Absolute: 0 10*3/uL (ref 0.0–0.2)
Basos: 1 %
EOS (ABSOLUTE): 0.1 10*3/uL (ref 0.0–0.4)
Eos: 2 %
Hematocrit: 38.1 % (ref 34.0–46.6)
Hemoglobin: 12.3 g/dL (ref 11.1–15.9)
Immature Grans (Abs): 0 10*3/uL (ref 0.0–0.1)
Immature Granulocytes: 0 %
Lymphocytes Absolute: 1.6 10*3/uL (ref 0.7–3.1)
Lymphs: 27 %
MCH: 29.9 pg (ref 26.6–33.0)
MCHC: 32.3 g/dL (ref 31.5–35.7)
MCV: 93 fL (ref 79–97)
Monocytes Absolute: 0.6 10*3/uL (ref 0.1–0.9)
Monocytes: 10 %
Neutrophils Absolute: 3.6 10*3/uL (ref 1.4–7.0)
Neutrophils: 60 %
Platelets: 252 10*3/uL (ref 150–450)
RBC: 4.11 x10E6/uL (ref 3.77–5.28)
RDW: 12.9 % (ref 11.7–15.4)
WBC: 6 10*3/uL (ref 3.4–10.8)

## 2021-12-07 LAB — LIPID PANEL
Chol/HDL Ratio: 3.5 ratio (ref 0.0–4.4)
Cholesterol, Total: 145 mg/dL (ref 100–199)
HDL: 42 mg/dL
LDL Chol Calc (NIH): 79 mg/dL (ref 0–99)
Triglycerides: 134 mg/dL (ref 0–149)
VLDL Cholesterol Cal: 24 mg/dL (ref 5–40)

## 2021-12-07 LAB — DRUG SCREEN 10 W/CONF, SERUM
Amphetamines, IA: NEGATIVE ng/mL
Barbiturates, IA: NEGATIVE ug/mL
Benzodiazepines, IA: POSITIVE ng/mL — AB
Cocaine & Metabolite, IA: NEGATIVE ng/mL
Methadone, IA: NEGATIVE ng/mL
Opiates, IA: NEGATIVE ng/mL
Oxycodones, IA: NEGATIVE ng/mL
Phencyclidine, IA: NEGATIVE ng/mL
Propoxyphene, IA: NEGATIVE ng/mL
THC(Marijuana) Metabolite, IA: NEGATIVE ng/mL

## 2021-12-07 LAB — BENZODIAZEPINES,MS,WB/SP RFX
7-Aminoclonazepam: NEGATIVE ng/mL
Alprazolam: NEGATIVE ng/mL
Benzodiazepines Confirm: POSITIVE
Chlordiazepoxide: NEGATIVE
Clonazepam: NEGATIVE ng/mL
Desalkylflurazepam: NEGATIVE ng/mL
Desmethylchlordiazepoxide: NEGATIVE
Desmethyldiazepam: NEGATIVE ng/mL
Diazepam: NEGATIVE ng/mL
Flurazepam: NEGATIVE ng/mL
Lorazepam: NEGATIVE ng/mL
Midazolam: NEGATIVE ng/mL
Oxazepam: 320 ng/mL
Temazepam: NEGATIVE ng/mL
Triazolam: NEGATIVE ng/mL

## 2021-12-07 LAB — CMP14+EGFR
ALT: 8 [IU]/L (ref 0–32)
AST: 19 [IU]/L (ref 0–40)
Albumin/Globulin Ratio: 1.4 (ref 1.2–2.2)
Albumin: 4 g/dL (ref 3.6–4.6)
Alkaline Phosphatase: 102 [IU]/L (ref 44–121)
BUN/Creatinine Ratio: 16 (ref 12–28)
BUN: 20 mg/dL (ref 8–27)
Bilirubin Total: 0.5 mg/dL (ref 0.0–1.2)
CO2: 24 mmol/L (ref 20–29)
Calcium: 9.8 mg/dL (ref 8.7–10.3)
Chloride: 98 mmol/L (ref 96–106)
Creatinine, Ser: 1.26 mg/dL — ABNORMAL HIGH (ref 0.57–1.00)
Globulin, Total: 2.9 g/dL (ref 1.5–4.5)
Glucose: 97 mg/dL (ref 70–99)
Potassium: 4.4 mmol/L (ref 3.5–5.2)
Sodium: 139 mmol/L (ref 134–144)
Total Protein: 6.9 g/dL (ref 6.0–8.5)
eGFR: 43 mL/min/{1.73_m2} — ABNORMAL LOW

## 2021-12-07 LAB — TSH: TSH: <0.005 u[IU]/mL — ABNORMAL LOW (ref 0.450–4.500)

## 2021-12-09 DIAGNOSIS — C44722 Squamous cell carcinoma of skin of right lower limb, including hip: Secondary | ICD-10-CM | POA: Diagnosis not present

## 2021-12-09 DIAGNOSIS — L570 Actinic keratosis: Secondary | ICD-10-CM | POA: Diagnosis not present

## 2021-12-10 ENCOUNTER — Telehealth: Payer: Self-pay | Admitting: Family Medicine

## 2021-12-10 DIAGNOSIS — R052 Subacute cough: Secondary | ICD-10-CM

## 2021-12-10 MED ORDER — ALBUTEROL SULFATE HFA 108 (90 BASE) MCG/ACT IN AERS
2.0000 | INHALATION_SPRAY | Freq: Four times a day (QID) | RESPIRATORY_TRACT | 0 refills | Status: DC | PRN
Start: 2021-12-10 — End: 2022-07-09

## 2021-12-10 NOTE — Telephone Encounter (Signed)
Pt informed on vmail.

## 2021-12-10 NOTE — Telephone Encounter (Signed)
Dr. Warrick Parisian,  Looks like she has used Albuterol in the past. It is not on her current med list. Please review for refill.

## 2021-12-10 NOTE — Telephone Encounter (Signed)
Sent albuterol to the pharmacy for the patient

## 2021-12-11 ENCOUNTER — Ambulatory Visit (INDEPENDENT_AMBULATORY_CARE_PROVIDER_SITE_OTHER): Payer: Medicare HMO

## 2021-12-11 VITALS — Wt 111.8 lb

## 2021-12-11 DIAGNOSIS — Z9181 History of falling: Secondary | ICD-10-CM | POA: Diagnosis not present

## 2021-12-11 DIAGNOSIS — Z602 Problems related to living alone: Secondary | ICD-10-CM

## 2021-12-11 DIAGNOSIS — Z0000 Encounter for general adult medical examination without abnormal findings: Secondary | ICD-10-CM

## 2021-12-11 DIAGNOSIS — Z599 Problem related to housing and economic circumstances, unspecified: Secondary | ICD-10-CM

## 2021-12-11 NOTE — Progress Notes (Signed)
Subjective:   [name] is a 81 y.o. female who presents for Medicare Annual (Subsequent) preventive examination.  Virtual Visit via Telephone Note  I connected with  [name] on 12/11/21 at  1:15 PM EST by telephone and verified that I am speaking with the correct person using two identifiers.  Location: Patient: Home Provider: [facility] Persons participating in the virtual visit: patient/Nurse Health Advisor   I discussed the limitations, risks, security and privacy concerns of performing an evaluation and management service by telephone and the availability of in person appointments. The patient expressed understanding and agreed to proceed.  Interactive audio and video telecommunications were attempted between this nurse and patient, however failed, due to patient having technical difficulties OR patient did not have access to video capability.  We continued and completed visit with audio only.  Some vital signs may be absent or patient reported.   Vanette Noguchi E Peggi Yono, LPN   Review of Systems     Cardiac Risk Factors include: advanced age (>58men, >48 women);sedentary lifestyle;dyslipidemia;hypertension;Other (see comment), Risk factor comments: atherosclerosis, pre-diabetes     Objective:    Today's Vitals   12/11/21 1341  Weight: 111 lb 12.8 oz (50.7 kg)   Body mass index is 21.12 kg/m.  Advanced Directives 12/11/2021 05/13/2021 11/19/2020 09/29/2020 07/17/2020 04/19/2020 11/19/2019  Does Patient Have a Medical Advance Directive? No No No No No No No  Would patient like information on creating a medical advance directive? No - Patient declined - Yes (MAU/Ambulatory/Procedural Areas - Information given) - No - Patient declined - No - Patient declined    Current Medications (verified) Outpatient Encounter Medications as of 12/11/2021  Medication Sig   acetaminophen (TYLENOL) 500 MG tablet Take 500 mg by mouth every 8 (eight) hours as needed for mild pain or moderate pain.    albuterol (VENTOLIN HFA) 108 (90 Base) MCG/ACT inhaler Inhale 2 puffs into the lungs every 6 (six) hours as needed for wheezing or shortness of breath.   atorvastatin (LIPITOR) 80 MG tablet TAKE 1 TABLET EVERY DAY   azelastine (ASTELIN) 0.1 % nasal spray Place 1 spray into both nostrils 2 (two) times daily. Use in each nostril as directed   Calcium-Phosphorus-Vitamin D (Maili) [phone] MG-MG-UNIT CHEW Chew 1 each by mouth 2 (two) times daily.   Cholecalciferol (VITAMIN D3) 2000 units CHEW Chew 4,000 Units by mouth daily.   furosemide (LASIX) 20 MG tablet Take 1 tablet (20 mg total) by mouth daily.   hydrochlorothiazide (HYDRODIURIL) 25 MG tablet Take 1 tablet (25 mg total) by mouth daily.   levothyroxine (SYNTHROID) 88 MCG tablet Take 1 tablet (88 mcg total) by mouth daily.   metoprolol tartrate (LOPRESSOR) 25 MG tablet Take 1 tablet (25 mg total) by mouth 2 (two) times daily as needed.   Misc. Devices (COMMODE BEDSIDE) MISC Bedside commode due to tibial fracture   Misc. Devices [facility]) MISC Pt needs wheelchair due to extremity fractures   Misc. Devices MISC Use rolling walker to ambulate around the house as needed.   Multiple Vitamin (MULTIVITAMIN WITH MINERALS) TABS tablet Take 2 tablets by mouth daily.    omeprazole (PRILOSEC) 40 MG capsule Take 1 capsule (40 mg total) by mouth daily.   ondansetron (ZOFRAN ODT) 4 MG disintegrating tablet Take 1 tablet (4 mg total) by mouth every 8 (eight) hours as needed for nausea or vomiting.   oxazepam (SERAX) 15 MG capsule Take 1 capsule (15 mg total) by mouth 3 (three) times daily  as needed for sleep. Cancel any other prescriptions on file   polyethylene glycol (MIRALAX / GLYCOLAX) packet Take 17 g by mouth daily as needed for moderate constipation.    potassium chloride 20 MEQ/15ML (10%) SOLN Take 7.5 mLs (10 mEq total) by mouth daily as needed (For cramping).   sucralfate (CARAFATE) 1 g tablet Take 1 tablet (1 g total) by  mouth 2 (two) times daily.   triamcinolone ointment (KENALOG) 0.5 % APPLY TO THE AFFECTED AREA(S) TOPICALLY TWICE DAILY (Patient taking differently: Apply 1 application topically 2 (two) times daily.)   mirtazapine (REMERON SOL-TAB) 15 MG disintegrating tablet Take 1 tablet (15 mg total) by mouth at bedtime. (Patient not taking: Reported on 12/11/2021)   No facility-administered encounter medications on file as of 12/11/2021.    Allergies (verified) Bisphosphonates, Ciprofloxacin, Codeine, and Sulfa antibiotics   History: Past Medical History:  Diagnosis Date   Anxiety states    Breast cyst    Cancer ([place])    skin   Cataract    Constipation    Esophageal stricture    Essential hypertension 12/18/2015   Fracture, rib    GERD (gastroesophageal reflux disease)    Hiatal hernia    Lower extremity edema 12/18/2015   Osteoporosis    Other and unspecified hyperlipidemia    PAC (premature atrial contraction) 12/30/2015   Palpitations 12/18/2015   Unspecified hypothyroidism    Vertebral fracture, osteoporotic ([facility])    Past Surgical History:  Procedure Laterality Date   ABDOMINAL HYSTERECTOMY     APPENDECTOMY     BREAST BIOPSY     bil cysts   CATARACT EXTRACTION     ESOPHAGOGASTRODUODENOSCOPY N/A 01/19/2018   Procedure: ESOPHAGOGASTRODUODENOSCOPY (EGD);  Surgeon: Clarene Essex, MD;  Location: [facility];  Service: Endoscopy;  Laterality: N/A;  with flouro   EYE SURGERY     HAND SURGERY Left    IR GENERIC HISTORICAL  12/10/2016   IR RADIOLOGIST EVAL & MGMT 12/10/2016 [facility]   IR GENERIC HISTORICAL  12/23/2016   IR KYPHO THORACIC WITH BONE BIOPSY 12/23/2016 Luanne Bras, MD [facility]   PARTIAL HYSTERECTOMY     THYROID LOBECTOMY     right   VERTEBROPLASTY     Family History  Problem Relation Age of Onset   Goiter Father    Alcohol abuse Father    Heart disease Sister    Cancer Sister        breast and skin   Goiter Brother    Dementia Brother    Hyperlipidemia Brother     Hernia Brother    Cancer Brother        throat   Cancer Brother        neck   Heart disease Brother    Early death Brother        auto accident   Social History   Socioeconomic History   Marital status: Married    Spouse name: Sports coach   Number of children: 0   Years of education: 8   Highest education level: 8th grade  Occupational History   Occupation: retired    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   Social Determinants of Health   Financial Resource Strain: Medium Risk   Difficulty of Paying Living Expenses: Somewhat hard  Food Insecurity: Food Insecurity Present   Worried About Running Out of Food in the Last Year: Sometimes true   Ran Out of Food in the Last Year: Never true  Transportation Needs: No Transportation Needs   Lack of Transportation (Medical): No   Lack of Transportation (Non-Medical): No  Physical Activity: Insufficiently Active   Days of Exercise per Week: 7 days   Minutes of Exercise per Session: 20 min  Stress: No Stress Concern Present   Feeling of Stress : Only a little  Social Connections: Moderately Integrated   Frequency of Communication with Friends and Family: More than three times a week   Frequency of Social Gatherings with Friends and Family: More than three times a week   Attends Religious Services: Never   Marine scientist or Organizations: Yes   Attends Music therapist: More than 4 times per year   Marital Status: Married    Tobacco  Counseling Counseling given: Not Answered Tobacco comments: Non-smoker  quit 12-13 years   Clinical Intake:  Pre-visit preparation completed: Yes  Pain : No/denies pain     BMI - recorded: 20.41 Nutritional Status: BMI of 19-24  Normal Nutritional Risks: Unintentional weight loss Diabetes: No  How often do you need to have someone help you when you read instructions, pamphlets, or other written materials from your doctor or pharmacy?: 1 - Never  Diabetic? Pre-diabetic  Interpreter Needed?: No  Information entered by :: Romelia Bromell, LPN   Activities of Daily Living In your present state of health, do you have any difficulty performing the following activities: 12/11/2021  Hearing? Y  Comment has hearing aids  Vision? Y  Comment planning to see new eye doctor soon  Difficulty concentrating or making decisions? Y  Walking or climbing stairs? Y  Dressing or bathing? N  Doing errands, shopping? Y  Comment no longer driving - her husband Restaurant manager, fast food and eating ? N  Using the Toilet? N  In the past six months, have you accidently leaked urine? N  Do you have problems with loss of bowel control? N  Managing your Medications? N  Managing your Finances? N  Housekeeping or managing your Housekeeping? N  Some recent data might be hidden    Patient Care Team: Dettinger, Fransisca Kaufmann, MD as PCP - General (Family Medicine) Minus Breeding, MD (Cardiology) Clarene Essex, MD (Gastroenterology) Skeet Latch, MD as Attending Physician (Cardiology) Sandford Craze, MD as Referring Physician (Dermatology)  Indicate any recent Medical Services you may have received from other than [facility] providers in the past year (date may be approximate).     Assessment:   This is a routine wellness examination for [name].  Hearing/Vision screen Hearing Screening - Comments:: Has hearing aids - doesn't usually wear them - doesn't feel that they help much Vision Screening -  Comments:: Wears rx glasses - up to date with routine eye exams with [facility] - will likely go to [facility] time  Dietary issues and exercise activities discussed: Current Exercise Habits: Home exercise routine, Type of exercise: walking;stretching, Time (Minutes): 20, Frequency (Times/Week): 7, Weekly Exercise (  Minutes/Week): 140, Intensity: Mild, Exercise limited by: orthopedic condition(s)   Goals Addressed             This Visit's Progress    Prevent falls   On track      Depression Screen PHQ 2/9 Scores 12/11/2021 11/27/2021 07/27/2021 03/27/2021 11/19/2020 11/19/2020 06/18/2020  PHQ - 2 Score 0 0 0 0 0 0 0    Fall Risk Fall Risk  12/11/2021 11/27/2021 07/27/2021 03/27/2021 11/19/2020  Falls in the past year? 0 0 0 0 1  Number falls in past yr: 0 - - - 0  Injury with Fall? 0 - - - 1  Risk Factor Category  - - - - -  Risk for fall due to : Impaired balance/gait;Orthopedic patient;Medication side effect - - - History of fall(s)  Follow up Education provided;Falls prevention discussed - - - Falls evaluation completed    FALL RISK PREVENTION PERTAINING TO THE HOME:  Any stairs in or around the home? No  If so, are there any without handrails? No  Home free of loose throw rugs in walkways, pet beds, electrical cords, etc? Yes  Adequate lighting in your home to reduce risk of falls? Yes   ASSISTIVE DEVICES UTILIZED TO PREVENT FALLS:  Life alert?  No, but is interested - sent referral to [facility] Use of a cane, walker or w/c? Yes  Grab bars in the bathroom? Yes  Shower chair or bench in shower? Yes  Elevated toilet seat or a handicapped toilet? Yes   TIMED UP AND GO:  Was the test performed? No . Telephonic visit  Cognitive Function: MMSE - Mini Mental State Exam 11/16/2018 08/16/2017 05/28/2015  Orientation to time 5 5 5   Orientation to Place 5 5 5   Registration 3 3 3   Attention/ Calculation 5 3 5   Recall 2 3 3   Language- name 2 objects 2 2 2   Language- repeat 1 1 1    Language- follow 3 step command 2 3 2   Language- read & follow direction 1 1 1   Write a sentence 1 1 1   Copy design 1 1 1   Total score 28 28 29      6CIT Screen 12/11/2021 11/19/2020 11/19/2019  What Year? 0 points 0 points 0 points  What month? 0 points 0 points 0 points  What time? 0 points 0 points 0 points  Count back from 20 0 points 0 points 4 points  Months in reverse 4 points 2 points 4 points  Repeat phrase 6 points 2 points 10 points  Total Score 10 4 18     Immunizations Immunization History  Administered Date(s) Administered   Fluad Quad(high Dose 65+) 07/31/2019, 08/06/2020, 07/27/2021   Influenza Whole 06/18/2010   Influenza, High Dose Seasonal PF 08/19/2016, 08/02/2017, 08/01/2018   Influenza,inj,Quad PF,6+ Mos 07/23/2013, 07/17/2014, 08/06/2015   Moderna Sars-Covid-2 Vaccination 01/02/2020, 01/30/2020   Pneumococcal Conjugate-13 11/05/2013   Pneumococcal Polysaccharide-23 10/18/2004   Td 08/18/2010   Tdap 08/12/2011   Zoster Recombinat (Shingrix) 11/27/2021   Zoster, Live 08/26/2010    TDAP status: Due, Education has been provided regarding the importance of this vaccine. Advised may receive this vaccine at local pharmacy or Health Dept. Aware to provide a copy of the vaccination record if obtained from local pharmacy or Health Dept. Verbalized acceptance and understanding.  Flu Vaccine status: Up to date  Pneumococcal vaccine status: Up to date  Covid-19 vaccine status: Completed vaccines  Qualifies for Shingles Vaccine? Yes   Zostavax completed Yes   Shingrix  Completed?: No.    Education has been provided regarding the importance of this vaccine. Patient has been advised to call insurance company to determine out of pocket expense if they have not yet received this vaccine. Advised may also receive vaccine at local pharmacy or Health Dept. Verbalized acceptance and understanding.  Screening Tests Health Maintenance  Topic Date Due   TETANUS/TDAP   08/11/2021   COVID-19 Vaccine (3 - Booster for Moderna series) 12/18/2021 (Originally 03/26/2020)   MAMMOGRAM  02/22/2022 (Originally 11/12/2021)   Zoster Vaccines- Shingrix (2 of 2) 01/22/2022   DEXA SCAN  09/22/2022   Pneumonia Vaccine 83+ Years old  Completed   INFLUENZA VACCINE  Completed   HPV VACCINES  Aged Out    Health Maintenance  Health Maintenance Due  Topic Date Due   TETANUS/TDAP  08/11/2021    Colorectal cancer screening: No longer required.   Mammogram status: Completed 11/12/2020. Repeat every year she may not have anymore   Bone Density status: Completed 09/22/2020. Results reflect: Bone density results: OSTEOPOROSIS. Repeat every 2 years.  Lung Cancer Screening: (Low Dose CT Chest recommended if Age 13-80 years, 30 pack-year currently smoking OR have quit w/in 15years.) does not qualify.   Additional Screening:  Hepatitis C Screening: does not qualify  Vision Screening: Recommended annual ophthalmology exams for early detection of glaucoma and other disorders of the eye. Is the patient up to date with their annual eye exam?  Yes  Who is the provider or what is the name of the office in which the patient attends annual eye exams? [facility] in [place] - will likely see [place] next time If pt is not established with a provider, would they like to be referred to a provider to establish care? No .   Dental Screening: Recommended annual dental exams for proper oral hygiene  Community Resource Referral / Chronic Care Management: CRR required this visit?  No   CCM required this visit?  No      Plan:     I have personally reviewed and noted the following in the patients chart:   Medical and social history Use of alcohol, tobacco or illicit drugs  Current medications and supplements including opioid prescriptions.  Functional ability and status Nutritional status Physical activity Advanced directives List of other  physicians Hospitalizations, surgeries, and ER visits in previous 12 months Vitals Screenings to include cognitive, depression, and falls Referrals and appointments  In addition, I have reviewed and discussed with patient certain preventive protocols, quality metrics, and best practice recommendations. A written personalized care plan for preventive services as well as general preventive health recommendations were provided to patient.   Due to this being a telephonic visit, the after visit summary with patients personalized plan was offered to patient via mail or my-chart. Patient declined at this time.   Sandrea Hammond, LPN   [id number]/[id number]   Nurse Notes: 1. She tried mirtazapine; says it caused her to ache all over, won't take anymore. 2. She tried Ensure and it made her stomach hurt and have diarrhea. She still has poor appetite and wants to know what to do about it. 3. She takes Metoprolol, but feels like sometimes her heart is fluttering, no pain or other sx.

## 2021-12-11 NOTE — Addendum Note (Signed)
Addended by: Adalberto Cole E on: 12/11/2021 02:31 PM   Modules accepted: Orders

## 2021-12-11 NOTE — Patient Instructions (Addendum)
[name] , Thank you for taking time to come for your Medicare Wellness Visit. I appreciate your ongoing commitment to your health goals. Please review the following plan we discussed and let me know if I can assist you in the future.   Screening recommendations/referrals: Colonoscopy: No longer required Mammogram: Done 11/12/2020 - Repeat annually *due - let us know if you want [place] to schedule this Bone Density: Done 09/22/2020 - Repeat every 2 years Recommended yearly ophthalmology/optometry visit for glaucoma screening and checkup Recommended yearly dental visit for hygiene and checkup  Vaccinations: Influenza vaccine: Done 07/27/2021 - Repeat annually Pneumococcal vaccine: Done 2006 & 11/05/2013 Tdap vaccine: Done 08/12/2011 - Repeat in 10 years *due at next visit Shingles vaccine: Done 11/27/2021 - get second dose at next visit   Covid-19: Done 01/02/2020 & 01/30/2020 - declines boosters  Advanced directives: Advance directive discussed with you today. Even though you declined this today, please call our office should you change your mind, and we can give you the proper paperwork for you to fill out.   Conditions/risks identified: Aim for 30 minutes of exercise or brisk walking, 6-8 glasses of water, and 5 servings of fruits and vegetables each day.   Next appointment: Follow up in one year for your annual wellness visit    Preventive Care 65 Years and Older, Female Preventive care refers to lifestyle choices and visits with your health care provider that can promote health and wellness. What does preventive care include? A yearly physical exam. This is also called an annual well check. Dental exams once or twice a year. Routine eye exams. Ask your health care provider how often you should have your eyes checked. Personal lifestyle choices, including: Daily care of your teeth and gums. Regular physical activity. Eating a healthy diet. Avoiding tobacco and drug use. Limiting  alcohol use. Practicing safe sex. Taking low-dose aspirin every day. Taking vitamin and mineral supplements as recommended by your health care provider. What happens during an annual well check? The services and screenings done by your health care provider during your annual well check will depend on your age, overall health, lifestyle risk factors, and family history of disease. Counseling  Your health care provider may ask you questions about your: Alcohol use. Tobacco use. Drug use. Emotional well-being. Home and relationship well-being. Sexual activity. Eating habits. History of falls. Memory and ability to understand (cognition). Work and work Statistician. Reproductive health. Screening  You may have the following tests or measurements: Height, weight, and BMI. Blood pressure. Lipid and cholesterol levels. These may be checked every 5 years, or more frequently if you are over 31 years old. Skin check. Lung cancer screening. You may have this screening every year starting at age 46 if you have a 30-pack-year history of smoking and currently smoke or have quit within the past 15 years. Fecal occult blood test (FOBT) of the stool. You may have this test every year starting at age 74. Flexible sigmoidoscopy or colonoscopy. You may have a sigmoidoscopy every 5 years or a colonoscopy every 10 years starting at age 34. Hepatitis C blood test. Hepatitis B blood test. Sexually transmitted disease (STD) testing. Diabetes screening. This is done by checking your blood sugar (glucose) after you have not eaten for a while (fasting). You may have this done every 1-3 years. Bone density scan. This is done to screen for osteoporosis. You may have this done starting at age 25. Mammogram. This may be done every 1-2 years. Talk to  your health care provider about how often you should have regular mammograms. Talk with your health care provider about your test results, treatment options, and if  necessary, the need for more tests. Vaccines  Your health care provider may recommend certain vaccines, such as: Influenza vaccine. This is recommended every year. Tetanus, diphtheria, and acellular pertussis (Tdap, Td) vaccine. You may need a Td booster every 10 years. Zoster vaccine. You may need this after age 15. Pneumococcal 13-valent conjugate (PCV13) vaccine. One dose is recommended after age 77. Pneumococcal polysaccharide (PPSV23) vaccine. One dose is recommended after age 75. Talk to your health care provider about which screenings and vaccines you need and how often you need them. This information is not intended to replace advice given to you by your health care provider. Make sure you discuss any questions you have with your health care provider. Document Released: 10/31/2015 Document Revised: 06/23/2016 Document Reviewed: 08/05/2015 Elsevier Interactive Patient Education  2017 [place] Prevention in the Home Falls can cause injuries. They can happen to people of all ages. There are many things you can do to make your home safe and to help prevent falls. What can I do on the outside of my home? Regularly fix the edges of walkways and driveways and fix any cracks. Remove anything that might make you trip as you walk through a door, such as a raised step or threshold. Trim any bushes or trees on the path to your home. Use bright outdoor lighting. Clear any walking paths of anything that might make someone trip, such as rocks or tools. Regularly check to see if handrails are loose or broken. Make sure that both sides of any steps have handrails. Any raised decks and porches should have guardrails on the edges. Have any leaves, snow, or ice cleared regularly. Use sand or salt on walking paths during winter. Clean up any spills in your garage right away. This includes oil or grease spills. What can I do in the bathroom? Use night lights. Install grab bars by the toilet  and in the tub and shower. Do not use towel bars as grab bars. Use non-skid mats or decals in the tub or shower. If you need to sit down in the shower, use a plastic, non-slip stool. Keep the floor dry. Clean up any water that spills on the floor as soon as it happens. Remove soap buildup in the tub or shower regularly. Attach bath mats securely with double-sided non-slip rug tape. Do not have throw rugs and other things on the floor that can make you trip. What can I do in the bedroom? Use night lights. Make sure that you have a light by your bed that is easy to reach. Do not use any sheets or blankets that are too big for your bed. They should not hang down onto the floor. Have a firm chair that has side arms. You can use this for support while you get dressed. Do not have throw rugs and other things on the floor that can make you trip. What can I do in the kitchen? Clean up any spills right away. Avoid walking on wet floors. Keep items that you use a lot in easy-to-reach places. If you need to reach something above you, use a strong step stool that has a grab bar. Keep electrical cords out of the way. Do not use floor polish or wax that makes floors slippery. If you must use wax, use non-skid floor wax. Do not  have throw rugs and other things on the floor that can make you trip. What can I do with my stairs? Do not leave any items on the stairs. Make sure that there are handrails on both sides of the stairs and use them. Fix handrails that are broken or loose. Make sure that handrails are as long as the stairways. Check any carpeting to make sure that it is firmly attached to the stairs. Fix any carpet that is loose or worn. Avoid having throw rugs at the top or bottom of the stairs. If you do have throw rugs, attach them to the floor with carpet tape. Make sure that you have a light switch at the top of the stairs and the bottom of the stairs. If you do not have them, ask someone to add  them for you. What else can I do to help prevent falls? Wear shoes that: Do not have high heels. Have rubber bottoms. Are comfortable and fit you well. Are closed at the toe. Do not wear sandals. If you use a stepladder: Make sure that it is fully opened. Do not climb a closed stepladder. Make sure that both sides of the stepladder are locked into place. Ask someone to hold it for you, if possible. Clearly mark and make sure that you can see: Any grab bars or handrails. First and last steps. Where the edge of each step is. Use tools that help you move around (mobility aids) if they are needed. These include: Canes. Walkers. Scooters. Crutches. Turn on the lights when you go into a dark area. Replace any light bulbs as soon as they burn out. Set up your furniture so you have a clear path. Avoid moving your furniture around. If any of your floors are uneven, fix them. If there are any pets around you, be aware of where they are. Review your medicines with your doctor. Some medicines can make you feel dizzy. This can increase your chance of falling. Ask your doctor what other things that you can do to help prevent falls. This information is not intended to replace advice given to you by your health care provider. Make sure you discuss any questions you have with your health care provider. Document Released: 07/31/2009 Document Revised: 03/11/2016 Document Reviewed: 11/08/2014 Elsevier Interactive Patient Education  2017 [employer].

## 2021-12-14 ENCOUNTER — Telehealth: Payer: Self-pay

## 2021-12-14 NOTE — Telephone Encounter (Signed)
° °  Telephone encounter was:  Unsuccessful.  12/14/2021 Name: MENNA ABELN MRN: [id number] DOB: Feb 17, 1941  Unsuccessful outbound call made today to assist with:   life alert  Outreach Attempt:  1st Attempt  A HIPAA compliant voice message was left requesting a return call.  Instructed patient to call back at earliest convenience. [place], Care Management  [phone] [address], [place], [place] [address] Phone: [phone] Email: [email or web address]

## 2021-12-15 ENCOUNTER — Telehealth: Payer: Self-pay

## 2021-12-15 NOTE — Telephone Encounter (Signed)
° °  Telephone encounter was:  Successful.  12/15/2021 Name: [name] MRN: [id number] DOB: 05-Sep-1941  [name] is a 81 y.o. year old female who is a primary care patient of Dettinger, Fransisca Kaufmann, MD . The community resource team was consulted for assistance with  life alert  Care guide performed the following interventions: Patient provided with information about care guide support team and interviewed to confirm resource needs.Called insurance and life line they should receive  life line in 7-10 days for her and husband  Follow Up Plan:  NO FOLLOW UP NEEDED   [place], Care Management  [phone] [address], [place], [place] [address] Phone: [phone] Email: [email or web address]

## 2022-02-17 ENCOUNTER — Other Ambulatory Visit: Payer: Self-pay | Admitting: Family Medicine

## 2022-03-09 ENCOUNTER — Encounter: Payer: Self-pay | Admitting: Nurse Practitioner

## 2022-03-09 ENCOUNTER — Telehealth: Payer: Self-pay | Admitting: Family Medicine

## 2022-03-09 ENCOUNTER — Ambulatory Visit (INDEPENDENT_AMBULATORY_CARE_PROVIDER_SITE_OTHER): Payer: Medicare HMO | Admitting: Nurse Practitioner

## 2022-03-09 ENCOUNTER — Other Ambulatory Visit: Payer: Self-pay

## 2022-03-09 ENCOUNTER — Ambulatory Visit (INDEPENDENT_AMBULATORY_CARE_PROVIDER_SITE_OTHER): Payer: Medicare HMO

## 2022-03-09 VITALS — BP 121/74 | HR 82 | Temp 98.1°F | Ht 61.0 in | Wt 107.6 lb

## 2022-03-09 DIAGNOSIS — J069 Acute upper respiratory infection, unspecified: Secondary | ICD-10-CM

## 2022-03-09 DIAGNOSIS — R062 Wheezing: Secondary | ICD-10-CM | POA: Diagnosis not present

## 2022-03-09 MED ORDER — AMOXICILLIN-POT CLAVULANATE 875-125 MG PO TABS: 1.0000 | ORAL_TABLET | Freq: Two times a day (BID) | ORAL | 0 refills | Status: DC

## 2022-03-09 MED ORDER — MOMETASONE FUROATE 50 MCG/ACT NA SUSP: 2.0000 | Freq: Every day | NASAL | 12 refills | Status: DC

## 2022-03-09 MED ORDER — MOMETASONE FUROATE 50 MCG/ACT NA SUSP: 2.0000 | Freq: Every day | NASAL | 12 refills | Status: AC

## 2022-03-09 NOTE — Patient Instructions (Signed)

## 2022-03-09 NOTE — Telephone Encounter (Signed)
Resent nasonex and ATB to [facility]. Husband informed.

## 2022-03-09 NOTE — Progress Notes (Signed)
Acute Office Visit  Subjective:     Patient ID: [name], female    DOB: 1941/03/30, 81 y.o.   MRN: [id number]  Chief Complaint  Patient presents with   Cough   Shortness of Breath    Cough This is a recurrent problem. The current episode started in the past 7 days. The problem has been gradually worsening. The problem occurs constantly. The cough is Productive of sputum. Associated symptoms include nasal congestion, shortness of breath and wheezing. Pertinent negatives include no chest pain, chills, ear pain, fever, rash or sore throat. Nothing aggravates the symptoms.  Shortness of Breath This is a new problem. The current episode started yesterday. The problem occurs constantly. The problem has been gradually worsening. Associated symptoms include wheezing. Pertinent negatives include no abdominal pain, chest pain, ear pain, fever, rash, sore throat or swollen glands.  URI  This is a recurrent problem. The current episode started 1 to 4 weeks ago. The problem has been unchanged. There has been no fever. Associated symptoms include congestion, coughing and wheezing. Pertinent negatives include no abdominal pain, chest pain, ear pain, nausea, rash, sore throat or swollen glands. She has tried nothing for the symptoms.    Review of Systems  Constitutional:  Positive for malaise/fatigue. Negative for chills and fever.  HENT:  Positive for congestion. Negative for ear pain and sore throat.   Eyes: Negative.   Respiratory:  Positive for cough, shortness of breath and wheezing.   Cardiovascular:  Negative for chest pain.  Gastrointestinal:  Negative for abdominal pain and nausea.  Skin: Negative.  Negative for rash.       Objective:    BP 121/74   Pulse 82   Temp 98.1 F (36.7 C)   Ht '5\' 1"'$  (1.549 m)   Wt 107 lb 9.6 oz (48.8 kg)   BMI 20.33 kg/m  BP Readings from Last 3 Encounters:  03/09/22 121/74  11/27/21 131/76  07/27/21 135/78   Wt Readings from Last 3  Encounters:  03/09/22 107 lb 9.6 oz (48.8 kg)  12/11/21 111 lb 12.8 oz (50.7 kg)  11/27/21 117 lb (53.1 kg)      Physical Exam Vitals and nursing note reviewed.  Constitutional:      Appearance: She is ill-appearing.  HENT:     Head: Normocephalic.     Right Ear: External ear normal.     Left Ear: External ear normal.     Nose: Congestion present.     Mouth/Throat:     Mouth: Mucous membranes are moist.  Eyes:     Conjunctiva/sclera: Conjunctivae normal.  Cardiovascular:     Rate and Rhythm: Normal rate.     Pulses: Normal pulses.     Heart sounds: Normal heart sounds.  Pulmonary:     Effort: Pulmonary effort is normal.     Breath sounds: Normal breath sounds.  Abdominal:     General: Bowel sounds are normal.     Palpations: Abdomen is soft.  Skin:    General: Skin is warm and dry.  Neurological:     General: No focal deficit present.     Mental Status: She is alert and oriented to person, place, and time.  Psychiatric:        Behavior: Behavior normal.    No results found for any visits on 03/09/22.      Assessment & Plan:  Take meds as prescribed - Use a cool mist humidifier  -Use saline nose sprays frequently -Force  fluids -For fever or aches or pains- take Tylenol or ibuprofen. -If symptoms do not improve, she may need to be COVID tested to rule this out Follow up with worsening unresolved symptoms  Problem List Items Addressed This Visit   None Visit Diagnoses     Upper respiratory infection with cough and congestion    -  Primary   Relevant Medications   SHINGRIX injection   mometasone (NASONEX) 50 MCG/ACT nasal spray   amoxicillin-clavulanate (AUGMENTIN) 875-125 MG tablet   Other Relevant Orders   DG Chest 2 View       Meds ordered this encounter  Medications   mometasone (NASONEX) 50 MCG/ACT nasal spray    Sig: Place 2 sprays into the nose daily.    Dispense:  1 each    Refill:  12    Order Specific Question:   Supervising Provider     Answer:   Claretta Fraise [phone]   amoxicillin-clavulanate (AUGMENTIN) 875-125 MG tablet    Sig: Take 1 tablet by mouth 2 (two) times daily.    Dispense:  14 tablet    Refill:  0    Order Specific Question:   Supervising Provider    Answer:   Claretta Fraise [phone]    Return if symptoms worsen or fail to improve.  Ivy Lynn, NP

## 2022-03-19 ENCOUNTER — Ambulatory Visit (INDEPENDENT_AMBULATORY_CARE_PROVIDER_SITE_OTHER): Payer: Medicare HMO | Admitting: Family Medicine

## 2022-03-19 ENCOUNTER — Encounter: Payer: Self-pay | Admitting: Family Medicine

## 2022-03-19 DIAGNOSIS — R197 Diarrhea, unspecified: Secondary | ICD-10-CM | POA: Diagnosis not present

## 2022-03-19 DIAGNOSIS — T3695XA Adverse effect of unspecified systemic antibiotic, initial encounter: Secondary | ICD-10-CM | POA: Diagnosis not present

## 2022-03-19 DIAGNOSIS — K521 Toxic gastroenteritis and colitis: Secondary | ICD-10-CM

## 2022-03-19 NOTE — Progress Notes (Signed)
Virtual Visit via Telephone Note  I connected with [name] on 03/19/22 at 3:09 PM by telephone and verified that I am speaking with the correct person using two identifiers. [name] is currently located at home and her husband is currently with her during this visit. The provider, Loman Brooklyn, FNP is located in their home at time of visit.  I discussed the limitations, risks, security and privacy concerns of performing an evaluation and management service by telephone and the availability of in person appointments. I also discussed with the patient that there may be a patient responsible charge related to this service. The patient expressed understanding and agreed to proceed.  Subjective: PCP: Dettinger, Fransisca Kaufmann, MD  Chief Complaint  Patient presents with   Diarrhea   Patient reports diarrhea since she was started on Augmentin over a week ago for an upper respiratory infection.  States she is having multiple stools per day.  They are not watery, but are very "squishy".  She describes it as dark, but not black and with no blood.   ROS: Per HPI  Current Outpatient Medications:    acetaminophen (TYLENOL) 500 MG tablet, Take 500 mg by mouth every 8 (eight) hours as needed for mild pain or moderate pain., Disp: , Rfl:    albuterol (VENTOLIN HFA) 108 (90 Base) MCG/ACT inhaler, Inhale 2 puffs into the lungs every 6 (six) hours as needed for wheezing or shortness of breath., Disp: 8 g, Rfl: 0   amoxicillin-clavulanate (AUGMENTIN) 875-125 MG tablet, Take 1 tablet by mouth 2 (two) times daily., Disp: 14 tablet, Rfl: 0   atorvastatin (LIPITOR) 80 MG tablet, TAKE 1 TABLET EVERY DAY, Disp: 90 tablet, Rfl: 0   azelastine (ASTELIN) 0.1 % nasal spray, Place 1 spray into both nostrils 2 (two) times daily. Use in each nostril as directed, Disp: 30 mL, Rfl: 12   Calcium-Phosphorus-Vitamin D ([place]) 678-938-101 MG-MG-UNIT CHEW, Chew 1 each by mouth 2 (two) times  daily., Disp: , Rfl:    Cholecalciferol (VITAMIN D3) 2000 units CHEW, Chew 4,000 Units by mouth daily., Disp: , Rfl:    furosemide (LASIX) 20 MG tablet, Take 1 tablet (20 mg total) by mouth daily., Disp: 90 tablet, Rfl: 3   hydrochlorothiazide (HYDRODIURIL) 25 MG tablet, Take 1 tablet (25 mg total) by mouth daily., Disp: 90 tablet, Rfl: 3   levothyroxine (SYNTHROID) 88 MCG tablet, TAKE 1 TABLET (88 MCG TOTAL) BY MOUTH DAILY., Disp: 90 tablet, Rfl: 0   metoprolol tartrate (LOPRESSOR) 25 MG tablet, Take 1 tablet (25 mg total) by mouth 2 (two) times daily as needed., Disp: 180 tablet, Rfl: 3   mirtazapine (REMERON SOL-TAB) 15 MG disintegrating tablet, Take 1 tablet (15 mg total) by mouth at bedtime., Disp: 90 tablet, Rfl: 3   Misc. Devices (COMMODE BEDSIDE) MISC, Bedside commode due to tibial fracture, Disp: 1 each, Rfl: 0   Misc. Devices [facility]) MISC, Pt needs wheelchair due to extremity fractures, Disp: 1 each, Rfl: 0   Misc. Devices MISC, Use rolling walker to ambulate around the house as needed., Disp: 1 each, Rfl: 0   mometasone (NASONEX) 50 MCG/ACT nasal spray, Place 2 sprays into the nose daily., Disp: 1 each, Rfl: 12   Multiple Vitamin (MULTIVITAMIN WITH MINERALS) TABS tablet, Take 2 tablets by mouth daily. , Disp: , Rfl:    omeprazole (PRILOSEC) 40 MG capsule, Take 1 capsule (40 mg total) by mouth daily., Disp: 90 capsule, Rfl: 3   ondansetron (  ZOFRAN ODT) 4 MG disintegrating tablet, Take 1 tablet (4 mg total) by mouth every 8 (eight) hours as needed for nausea or vomiting., Disp: 20 tablet, Rfl: 0   oxazepam (SERAX) 15 MG capsule, Take 1 capsule (15 mg total) by mouth 3 (three) times daily as needed for sleep. Cancel any other prescriptions on file, Disp: 90 capsule, Rfl: 3   polyethylene glycol (MIRALAX / GLYCOLAX) packet, Take 17 g by mouth daily as needed for moderate constipation. , Disp: , Rfl:    potassium chloride 20 MEQ/15ML (10%) SOLN, Take 7.5 mLs (10 mEq total) by mouth daily  as needed (For cramping)., Disp: 480 mL, Rfl: 2   SHINGRIX injection, , Disp: , Rfl:    sucralfate (CARAFATE) 1 g tablet, Take 1 tablet (1 g total) by mouth 2 (two) times daily., Disp: 180 tablet, Rfl: 3   triamcinolone ointment (KENALOG) 0.5 %, APPLY TO THE AFFECTED AREA(S) TOPICALLY TWICE DAILY (Patient taking differently: Apply 1 application. topically 2 (two) times daily.), Disp: 30 g, Rfl: 2  Allergies  Allergen Reactions   Bisphosphonates Other (See Comments)    Difficulty swallowing    Ciprofloxacin Nausea And Vomiting   Codeine Nausea And Vomiting   Sulfa Antibiotics Nausea And Vomiting   Past Medical History:  Diagnosis Date   Anxiety states    Breast cyst    Cancer ([facility])    skin   Cataract    Constipation    Esophageal stricture    Essential hypertension 12/18/2015   Fracture, rib    GERD (gastroesophageal reflux disease)    Hiatal hernia    Lower extremity edema 12/18/2015   Osteoporosis    Other and unspecified hyperlipidemia    PAC (premature atrial contraction) 12/30/2015   Palpitations 12/18/2015   Unspecified hypothyroidism    Vertebral fracture, osteoporotic (HCC)     Observations/Objective: A&O  No respiratory distress or wheezing audible over the phone Mood, judgement, and thought processes all WNL  Assessment and Plan: 1. Antibiotic-associated diarrhea Advised to take Imodium as directed on the bottle.   Follow Up Instructions:  I discussed the assessment and treatment plan with the patient. The patient was provided an opportunity to ask questions and all were answered. The patient agreed with the plan and demonstrated an understanding of the instructions.   The patient was advised to call back or seek an in-person evaluation if the symptoms worsen or if the condition fails to improve as anticipated.  The above assessment and management plan was discussed with the patient. The patient verbalized understanding of and has agreed to the management plan.  Patient is aware to call the clinic if symptoms persist or worsen. Patient is aware when to return to the clinic for a follow-up visit. Patient educated on when it is appropriate to go to the emergency department.   Time call ended: 3:22 PM  I provided 13 minutes of non-face-to-face time during this encounter.  Hendricks Limes, MSN, APRN, FNP-C [facility] 03/19/22

## 2022-03-25 ENCOUNTER — Other Ambulatory Visit: Payer: Self-pay | Admitting: Family Medicine

## 2022-03-25 DIAGNOSIS — I10 Essential (primary) hypertension: Secondary | ICD-10-CM

## 2022-03-26 ENCOUNTER — Encounter (HOSPITAL_COMMUNITY): Payer: Self-pay | Admitting: Emergency Medicine

## 2022-03-26 ENCOUNTER — Encounter: Payer: Self-pay | Admitting: Family Medicine

## 2022-03-26 ENCOUNTER — Emergency Department (HOSPITAL_COMMUNITY): Payer: Medicare HMO

## 2022-03-26 ENCOUNTER — Observation Stay (HOSPITAL_COMMUNITY)
Admission: EM | Admit: 2022-03-26 | Discharge: 2022-03-27 | Disposition: A | Discharge status: Home Health | Payer: Medicare HMO | Attending: Internal Medicine | Admitting: Internal Medicine

## 2022-03-26 ENCOUNTER — Other Ambulatory Visit: Payer: Self-pay

## 2022-03-26 ENCOUNTER — Ambulatory Visit (INDEPENDENT_AMBULATORY_CARE_PROVIDER_SITE_OTHER): Payer: Medicare HMO | Admitting: Family Medicine

## 2022-03-26 VITALS — BP 138/84 | HR 144 | Temp 97.6°F | Ht 61.0 in | Wt 105.0 lb

## 2022-03-26 DIAGNOSIS — Z85828 Personal history of other malignant neoplasm of skin: Secondary | ICD-10-CM | POA: Diagnosis not present

## 2022-03-26 DIAGNOSIS — F411 Generalized anxiety disorder: Secondary | ICD-10-CM

## 2022-03-26 DIAGNOSIS — R531 Weakness: Secondary | ICD-10-CM | POA: Diagnosis not present

## 2022-03-26 DIAGNOSIS — R7303 Prediabetes: Secondary | ICD-10-CM

## 2022-03-26 DIAGNOSIS — I10 Essential (primary) hypertension: Secondary | ICD-10-CM | POA: Diagnosis not present

## 2022-03-26 DIAGNOSIS — Z20822 Contact with and (suspected) exposure to covid-19: Secondary | ICD-10-CM | POA: Diagnosis not present

## 2022-03-26 DIAGNOSIS — K219 Gastro-esophageal reflux disease without esophagitis: Secondary | ICD-10-CM

## 2022-03-26 DIAGNOSIS — Z23 Encounter for immunization: Secondary | ICD-10-CM

## 2022-03-26 DIAGNOSIS — E039 Hypothyroidism, unspecified: Secondary | ICD-10-CM | POA: Diagnosis not present

## 2022-03-26 DIAGNOSIS — E034 Atrophy of thyroid (acquired): Secondary | ICD-10-CM

## 2022-03-26 DIAGNOSIS — Z87891 Personal history of nicotine dependence: Secondary | ICD-10-CM | POA: Insufficient documentation

## 2022-03-26 DIAGNOSIS — I4891 Unspecified atrial fibrillation: Secondary | ICD-10-CM | POA: Diagnosis not present

## 2022-03-26 DIAGNOSIS — N1832 Chronic kidney disease, stage 3b: Secondary | ICD-10-CM

## 2022-03-26 DIAGNOSIS — J441 Chronic obstructive pulmonary disease with (acute) exacerbation: Secondary | ICD-10-CM | POA: Insufficient documentation

## 2022-03-26 DIAGNOSIS — R000 Tachycardia, unspecified: Secondary | ICD-10-CM

## 2022-03-26 DIAGNOSIS — E876 Hypokalemia: Secondary | ICD-10-CM | POA: Diagnosis not present

## 2022-03-26 DIAGNOSIS — Z79899 Other long term (current) drug therapy: Secondary | ICD-10-CM | POA: Diagnosis not present

## 2022-03-26 DIAGNOSIS — E785 Hyperlipidemia, unspecified: Secondary | ICD-10-CM | POA: Diagnosis present

## 2022-03-26 DIAGNOSIS — E782 Mixed hyperlipidemia: Secondary | ICD-10-CM | POA: Diagnosis not present

## 2022-03-26 DIAGNOSIS — J449 Chronic obstructive pulmonary disease, unspecified: Secondary | ICD-10-CM | POA: Diagnosis not present

## 2022-03-26 DIAGNOSIS — E559 Vitamin D deficiency, unspecified: Secondary | ICD-10-CM | POA: Diagnosis present

## 2022-03-26 HISTORY — DX: Unspecified atrial fibrillation: I48.91

## 2022-03-26 LAB — COMPREHENSIVE METABOLIC PANEL
ALT: 10 U/L (ref 0–44)
AST: 19 U/L (ref 15–41)
Albumin: 3.1 g/dL — ABNORMAL LOW (ref 3.5–5.0)
Alkaline Phosphatase: 92 U/L (ref 38–126)
Anion gap: 5 (ref 5–15)
BUN: 13 mg/dL (ref 8–23)
CO2: 28 mmol/L (ref 22–32)
Calcium: 8.4 mg/dL — ABNORMAL LOW (ref 8.9–10.3)
Chloride: 105 mmol/L (ref 98–111)
Creatinine, Ser: 0.99 mg/dL (ref 0.44–1.00)
GFR, Estimated: 57 mL/min — ABNORMAL LOW (ref >60)
Glucose, Bld: 78 mg/dL (ref 70–99)
Potassium: 3.2 mmol/L — ABNORMAL LOW (ref 3.5–5.1)
Sodium: 138 mmol/L (ref 135–145)
Total Bilirubin: 0.7 mg/dL (ref 0.3–1.2)
Total Protein: 7.1 g/dL (ref 6.5–8.1)

## 2022-03-26 LAB — CBC WITH DIFFERENTIAL/PLATELET
Abs Immature Granulocytes: 0.02 10*3/uL (ref 0.00–0.07)
Basophils Absolute: 0.1 10*3/uL (ref 0.0–0.1)
Basophils Relative: 1 %
Eosinophils Absolute: 0.1 10*3/uL (ref 0.0–0.5)
Eosinophils Relative: 1 %
HCT: 38.6 % (ref 36.0–46.0)
Hemoglobin: 12.3 g/dL (ref 12.0–15.0)
Immature Granulocytes: 0 %
Lymphocytes Relative: 17 %
Lymphs Abs: 1.5 10*3/uL (ref 0.7–4.0)
MCH: 29.6 pg (ref 26.0–34.0)
MCHC: 31.9 g/dL (ref 30.0–36.0)
MCV: 93 fL (ref 80.0–100.0)
Monocytes Absolute: 0.7 10*3/uL (ref 0.1–1.0)
Monocytes Relative: 8 %
Neutro Abs: 6.8 10*3/uL (ref 1.7–7.7)
Neutrophils Relative %: 73 %
Platelets: 327 10*3/uL (ref 150–400)
RBC: 4.15 MIL/uL (ref 3.87–5.11)
RDW: 12.9 % (ref 11.5–15.5)
WBC: 9.2 10*3/uL (ref 4.0–10.5)
nRBC: 0 % (ref 0.0–0.2)

## 2022-03-26 LAB — TROPONIN I (HIGH SENSITIVITY)
Troponin I (High Sensitivity): 8 ng/L (ref <18)
Troponin I (High Sensitivity): 8 ng/L (ref <18)

## 2022-03-26 LAB — MAGNESIUM: Magnesium: 1.3 mg/dL — ABNORMAL LOW (ref 1.7–2.4)

## 2022-03-26 LAB — RESP PANEL BY RT-PCR (FLU A&B, COVID) ARPGX2
Influenza A by PCR: NEGATIVE
Influenza B by PCR: NEGATIVE
SARS Coronavirus 2 by RT PCR: NEGATIVE

## 2022-03-26 LAB — T4, FREE: Free T4: 2.23 ng/dL — ABNORMAL HIGH (ref 0.61–1.12)

## 2022-03-26 LAB — TSH: TSH: <0.01 u[IU]/mL — ABNORMAL LOW (ref 0.350–4.500)

## 2022-03-26 MED ORDER — VITAMIN D 25 MCG (1000 UNIT) PO TABS
4000.0000 [IU] | ORAL_TABLET | Freq: Every day | ORAL | Status: DC
  Administered 2022-03-27: 4000 [IU] via ORAL
  Filled 2022-03-26 (×4): qty 4

## 2022-03-26 MED ORDER — LEVOTHYROXINE SODIUM 88 MCG PO TABS
88.0000 ug | ORAL_TABLET | Freq: Every day | ORAL | 1 refills | Status: DC
Start: 2022-03-26 — End: 2022-03-27

## 2022-03-26 MED ORDER — PANTOPRAZOLE SODIUM 40 MG PO TBEC
40.0000 mg | DELAYED_RELEASE_TABLET | Freq: Every day | ORAL | Status: DC
  Administered 2022-03-27: 40 mg via ORAL
  Filled 2022-03-26: qty 1

## 2022-03-26 MED ORDER — OXAZEPAM 15 MG PO CAPS: 15.0000 mg | ORAL_CAPSULE | Freq: Three times a day (TID) | ORAL | 3 refills | Status: DC | PRN

## 2022-03-26 MED ORDER — SUCRALFATE 1 G PO TABS
1.0000 g | ORAL_TABLET | Freq: Two times a day (BID) | ORAL | Status: DC
  Administered 2022-03-26 – 2022-03-27 (×2): 1 g via ORAL
  Filled 2022-03-26 (×2): qty 1

## 2022-03-26 MED ORDER — GUAIFENESIN ER 600 MG PO TB12
600.0000 mg | ORAL_TABLET | Freq: Two times a day (BID) | ORAL | Status: DC
  Administered 2022-03-26 – 2022-03-27 (×2): 600 mg via ORAL
  Filled 2022-03-26 (×2): qty 1

## 2022-03-26 MED ORDER — POTASSIUM CHLORIDE CRYS ER 20 MEQ PO TBCR
30.0000 meq | EXTENDED_RELEASE_TABLET | Freq: Four times a day (QID) | ORAL | Status: AC
  Administered 2022-03-26 (×2): 30 meq via ORAL
  Filled 2022-03-26: qty 1
  Filled 2022-03-26: qty 2

## 2022-03-26 MED ORDER — POTASSIUM CHLORIDE CRYS ER 20 MEQ PO TBCR
40.0000 meq | EXTENDED_RELEASE_TABLET | Freq: Once | ORAL | Status: AC
  Administered 2022-03-26: 40 meq via ORAL
  Filled 2022-03-26: qty 2

## 2022-03-26 MED ORDER — MAGNESIUM SULFATE 2 GM/50ML IV SOLN
2.0000 g | Freq: Once | INTRAVENOUS | Status: AC
  Administered 2022-03-26: 2 g via INTRAVENOUS
  Filled 2022-03-26: qty 50

## 2022-03-26 MED ORDER — APIXABAN 2.5 MG PO TABS
2.5000 mg | ORAL_TABLET | Freq: Two times a day (BID) | ORAL | Status: DC
  Administered 2022-03-26 – 2022-03-27 (×2): 2.5 mg via ORAL
  Filled 2022-03-26 (×2): qty 1

## 2022-03-26 MED ORDER — DILTIAZEM HCL 25 MG/5ML IV SOLN
10.0000 mg | INTRAVENOUS | Status: DC | PRN
  Administered 2022-03-26: 10 mg via INTRAVENOUS
  Filled 2022-03-26 (×2): qty 5

## 2022-03-26 MED ORDER — ATORVASTATIN CALCIUM 40 MG PO TABS
80.0000 mg | ORAL_TABLET | Freq: Every day | ORAL | Status: DC
  Administered 2022-03-27: 80 mg via ORAL
  Filled 2022-03-26: qty 2

## 2022-03-26 MED ORDER — LORAZEPAM 0.5 MG PO TABS: 0.5000 mg | ORAL_TABLET | Freq: Three times a day (TID) | ORAL | Status: DC | PRN

## 2022-03-26 MED ORDER — SUCRALFATE 1 G PO TABS: 1.0000 g | ORAL_TABLET | Freq: Two times a day (BID) | ORAL | 3 refills | Status: DC

## 2022-03-26 MED ORDER — IPRATROPIUM BROMIDE 0.02 % IN SOLN
0.5000 mg | Freq: Four times a day (QID) | RESPIRATORY_TRACT | Status: DC
  Administered 2022-03-26 – 2022-03-27 (×3): 0.5 mg via RESPIRATORY_TRACT
  Filled 2022-03-26 (×4): qty 2.5

## 2022-03-26 MED ORDER — ONDANSETRON HCL 4 MG/2ML IJ SOLN: 4.0000 mg | Freq: Four times a day (QID) | INTRAMUSCULAR | Status: DC | PRN

## 2022-03-26 MED ORDER — PREDNISONE 20 MG PO TABS
40.0000 mg | ORAL_TABLET | Freq: Once | ORAL | Status: AC
Start: 2022-03-26 — End: 2022-03-26
  Administered 2022-03-26: 40 mg via ORAL
  Filled 2022-03-26: qty 2

## 2022-03-26 MED ORDER — HYDROCHLOROTHIAZIDE 25 MG PO TABS: 25.0000 mg | ORAL_TABLET | Freq: Every day | ORAL | 3 refills | Status: DC

## 2022-03-26 MED ORDER — ACETAMINOPHEN 325 MG PO TABS: 650.0000 mg | ORAL_TABLET | ORAL | Status: DC | PRN

## 2022-03-26 MED ORDER — POTASSIUM CHLORIDE 10 MEQ/100ML IV SOLN
10.0000 meq | Freq: Once | INTRAVENOUS | Status: AC
  Administered 2022-03-26: 10 meq via INTRAVENOUS
  Filled 2022-03-26: qty 100

## 2022-03-26 MED ORDER — DILTIAZEM HCL 25 MG/5ML IV SOLN
15.0000 mg | Freq: Once | INTRAVENOUS | Status: AC
  Administered 2022-03-26: 15 mg via INTRAVENOUS
  Filled 2022-03-26: qty 5

## 2022-03-26 MED ORDER — POLYETHYLENE GLYCOL 3350 17 G PO PACK: 17.0000 g | PACK | Freq: Every day | ORAL | Status: DC | PRN

## 2022-03-26 MED ORDER — ADULT MULTIVITAMIN W/MINERALS CH
2.0000 | ORAL_TABLET | Freq: Every day | ORAL | Status: DC
  Administered 2022-03-27: 2 via ORAL
  Filled 2022-03-26: qty 2

## 2022-03-26 MED ORDER — METOPROLOL TARTRATE 25 MG PO TABS
25.0000 mg | ORAL_TABLET | Freq: Two times a day (BID) | ORAL | Status: DC
  Administered 2022-03-26 – 2022-03-27 (×2): 25 mg via ORAL
  Filled 2022-03-26 (×2): qty 1

## 2022-03-26 NOTE — Addendum Note (Signed)
Addended by: Alphonzo Dublin on: 03/26/2022 12:47 PM   Modules accepted: Orders

## 2022-03-26 NOTE — Progress Notes (Signed)
BP 138/84   Pulse (!) 144   Temp 97.6 F (36.4 C)   Ht 5' 1"  (1.549 m)   Wt 105 lb (47.6 kg)   SpO2 100%   BMI 19.84 kg/m    Subjective:   Patient ID: [name], female    DOB: Nov 11, 1940, 81 y.o.   MRN: [id number]  HPI: [name] is a 81 y.o. female presenting on 03/26/2022 for Medical Management of Chronic Issues, Hypertension, Hyperlipidemia, Hypothyroidism, and Prediabetes   HPI Hyperlipidemia Patient is coming in for recheck of his hyperlipidemia. The patient is currently taking lipitor. They deny any issues with myalgias or history of liver damage from it. They deny any focal numbness or weakness or chest pain.   Hypothyroidism recheck Patient is coming in for thyroid recheck today as well. They deny any issues with hair changes or heat or cold problems or diarrhea or constipation. They deny any chest pain or palpitations. They are currently on levothyroxine 88 micrograms   Hypertension Patient is currently on furosemide and hydrochlorothiazide and metoprolol, and their blood pressure today is 138/84. Patient denies any lightheadedness or dizziness. Patient denies headaches, blurred vision, chest pains, or weakness. Denies any side effects from medication and is content with current medication.   Patient is coming in today and was found to be tachycardic in the office.  She does say she gets a little short of breath with exertion or walking more than 10 to 15 feet.  She has been fighting a cough and congestion recently.  She did do an antibiotic which did give her some diarrhea.  Anxiety recheck Current rx-oxazepam 15 -3 times daily as needed # meds rx- 90 Effectiveness of current meds-works well Adverse reactions form meds-none  Pill count performed-No Last drug screen -12/16/2021 ( high risk q20m moderate risk q630mlow risk yearly ) Urine drug screen today- No Was the [place]yes  If yes were their any concerning findings? -None  No flowsheet data  found.   Controlled substance contract signed on: 12/16/2021  Relevant past medical, surgical, family and social history reviewed and updated as indicated. Interim medical history since our last visit reviewed. Allergies and medications reviewed and updated.  Review of Systems  Constitutional:  Negative for chills and fever.  HENT:  Positive for congestion. Negative for ear discharge, ear pain, sinus pressure, sinus pain and sneezing.   Eyes:  Negative for visual disturbance.  Respiratory:  Positive for cough and shortness of breath. Negative for chest tightness and wheezing.   Cardiovascular:  Negative for chest pain and leg swelling.  Genitourinary:  Negative for difficulty urinating and dysuria.  Musculoskeletal:  Negative for back pain and gait problem.  Skin:  Negative for rash.  Neurological:  Negative for light-headedness and headaches.  Psychiatric/Behavioral:  Negative for agitation and behavioral problems.   All other systems reviewed and are negative.   Per HPI unless specifically indicated above   Allergies as of 03/26/2022       Reactions   Bisphosphonates Other (See Comments)   Difficulty swallowing   Ciprofloxacin Nausea And Vomiting   Codeine Nausea And Vomiting   Sulfa Antibiotics Nausea And Vomiting        Medication List        Accurate as of March 26, 2022 11:26 AM. If you have any questions, ask your nurse or doctor.          acetaminophen 500 MG tablet Commonly known as: TYLENOL Take 500 mg by  mouth every 8 (eight) hours as needed for mild pain or moderate pain.   albuterol 108 (90 Base) MCG/ACT inhaler Commonly known as: VENTOLIN HFA Inhale 2 puffs into the lungs every 6 (six) hours as needed for wheezing or shortness of breath.   amoxicillin-clavulanate 875-125 MG tablet Commonly known as: AUGMENTIN Take 1 tablet by mouth 2 (two) times daily.   atorvastatin 80 MG tablet Commonly known as: LIPITOR TAKE 1 TABLET EVERY DAY   azelastine 0.1  % nasal spray Commonly known as: ASTELIN Place 1 spray into both nostrils 2 (two) times daily. Use in each nostril as directed   Calcium-Phosphorus-Vitamin D 250-115-250 MG-MG-UNIT Chew Commonly known as: Citracal Calcium Gummies Chew 1 each by mouth 2 (two) times daily.   Commode Bedside Misc Bedside commode due to tibial fracture   Wheelchair Misc Pt needs wheelchair due to extremity fractures   Misc. Devices Misc Use rolling walker to ambulate around the house as needed.   furosemide 20 MG tablet Commonly known as: LASIX Take 1 tablet (20 mg total) by mouth daily.   hydrochlorothiazide 25 MG tablet Commonly known as: HYDRODIURIL Take 1 tablet (25 mg total) by mouth daily.   levothyroxine 88 MCG tablet Commonly known as: SYNTHROID Take 1 tablet (88 mcg total) by mouth daily.   metoprolol tartrate 25 MG tablet Commonly known as: LOPRESSOR Take 1 tablet (25 mg total) by mouth 2 (two) times daily as needed.   mirtazapine 15 MG disintegrating tablet Commonly known as: REMERON SOL-TAB Take 1 tablet (15 mg total) by mouth at bedtime.   mometasone 50 MCG/ACT nasal spray Commonly known as: Nasonex Place 2 sprays into the nose daily.   multivitamin with minerals Tabs tablet Take 2 tablets by mouth daily.   omeprazole 40 MG capsule Commonly known as: PRILOSEC Take 1 capsule (40 mg total) by mouth daily.   ondansetron 4 MG disintegrating tablet Commonly known as: Zofran ODT Take 1 tablet (4 mg total) by mouth every 8 (eight) hours as needed for nausea or vomiting.   oxazepam 15 MG capsule Commonly known as: SERAX Take 1 capsule (15 mg total) by mouth 3 (three) times daily as needed for sleep. Cancel any other prescriptions on file   polyethylene glycol 17 g packet Commonly known as: MIRALAX / GLYCOLAX Take 17 g by mouth daily as needed for moderate constipation.   potassium chloride 20 MEQ/15ML (10%) Soln TAKE 7.5ML (10MEQ) BY MOUTH DAILY AS NEEDED FOR CRAMPING    Shingrix injection Generic drug: Zoster Vaccine Adjuvanted   sucralfate 1 g tablet Commonly known as: CARAFATE Take 1 tablet (1 g total) by mouth 2 (two) times daily.   triamcinolone ointment 0.5 % Commonly known as: KENALOG APPLY TO THE AFFECTED AREA(S) TOPICALLY TWICE DAILY What changed: See the new instructions.   Vitamin D3 50 MCG (2000 UT) Chew Chew 4,000 Units by mouth daily.         Objective:   BP 138/84   Pulse (!) 144   Temp 97.6 F (36.4 C)   Ht 5' 1"  (1.549 m)   Wt 105 lb (47.6 kg)   SpO2 100%   BMI 19.84 kg/m   Wt Readings from Last 3 Encounters:  03/26/22 105 lb (47.6 kg)  03/09/22 107 lb 9.6 oz (48.8 kg)  12/11/21 111 lb 12.8 oz (50.7 kg)    Physical Exam Vitals and nursing note reviewed.  Constitutional:      General: She is not in acute distress.    Appearance:  She is well-developed. She is not diaphoretic.  Eyes:     Conjunctiva/sclera: Conjunctivae normal.  Cardiovascular:     Rate and Rhythm: Tachycardia present. Rhythm irregular.     Heart sounds: Normal heart sounds. No murmur heard. Pulmonary:     Effort: Pulmonary effort is normal. No respiratory distress.     Breath sounds: Normal breath sounds. No wheezing.  Musculoskeletal:        General: No swelling or tenderness. Normal range of motion.  Skin:    General: Skin is warm and dry.     Findings: No rash.  Neurological:     Mental Status: She is alert and oriented to person, place, and time.     Coordination: Coordination normal.  Psychiatric:        Behavior: Behavior normal.     EKG: A-fib with RVR with a heart rate in the 133-138 range.  Occasional PVC  Assessment & Plan:   Problem List Items Addressed This Visit       Cardiovascular and Mediastinum   Essential hypertension   Relevant Medications   hydrochlorothiazide (HYDRODIURIL) 25 MG tablet   Other Relevant Orders   Vitamin B12     Digestive   GERD (gastroesophageal reflux disease)   Relevant Medications    sucralfate (CARAFATE) 1 g tablet   Other Relevant Orders   CBC with Differential/Platelet   CMP14+EGFR   Vitamin B12     Endocrine   Hypothyroid   Relevant Medications   levothyroxine (SYNTHROID) 88 MCG tablet   Other Relevant Orders   TSH   Vitamin B12     Genitourinary   Chronic kidney disease (CKD) stage G3b/A1, moderately decreased glomerular filtration rate (GFR) between 30-44 mL/min/1.73 square meter and albuminuria creatinine ratio less than 30 mg/g (HCC)   Relevant Orders   Vitamin B12     Other   Hyperlipemia - Primary   Relevant Medications   hydrochlorothiazide (HYDRODIURIL) 25 MG tablet   Other Relevant Orders   Lipid panel   Vitamin B12   Generalized anxiety disorder   Relevant Medications   oxazepam (SERAX) 15 MG capsule   Other Relevant Orders   Vitamin B12   Prediabetes   Relevant Orders   Bayer DCA Hb A1c Waived   Other Visit Diagnoses     Tachycardia       Relevant Orders   EKG 12-Lead     Patient has new onset A-fib that has not been present before with RVR and recommended for her to go to the emergency department for evaluation, she does not have a cardiologist and has not seen one in many years, so she will need evaluation to get reestablished and figure out why she has this new A-fib.  She does have diarrhea recently so it may be from dehydration but we want to make sure she did not have anything such as a myocardial infarction.  Changes another medicine, will follow-up after she gets out of the emergency department.  Follow up plan: Return in about 3 months (around 06/26/2022), or if symptoms worsen or fail to improve, for Hypertension and thyroid and GERD.  Counseling provided for all of the vaccine components Orders Placed This Encounter  Procedures   CBC with Differential/Platelet   CMP14+EGFR   Lipid panel   TSH   Bayer DCA Hb A1c Waived   Vitamin B12   EKG 12-Lead    Caryl Pina, MD [facility] 03/26/2022, 11:26 AM

## 2022-03-26 NOTE — ED Provider Notes (Signed)
[facility] EMERGENCY DEPARTMENT Provider Note   CSN: [id number] Arrival date & time: 03/26/22  1315     History  Chief Complaint  Patient presents with   Weakness    [name] is a 81 y.o. female.  Patient presents to ER chief complaint of irregular heartbeat.  Patient was seen by her primary care doctor today and sent to the ER after discovery of a rapid heart rate presumed atrial fibrillation new onset.  Patient had been struggling with a cough for about 3 weeks.  She finished a course of Augmentin several weeks ago and developed diarrhea for several weeks.  She states that the diarrhea has also resolved about 4 days ago and since then she is been having normal stools.  Denies any fevers denies vomiting denies any pain at this time.  Has a persistent cough unchanged now for 3 weeks however.  She was seen by her primary care doctor's office again today found to have a rapid heart rate and sent to the ER.       Home Medications Prior to Admission medications   Medication Sig Start Date End Date Taking? Authorizing Provider  acetaminophen (TYLENOL) 500 MG tablet Take 500 mg by mouth every 8 (eight) hours as needed for mild pain or moderate pain.    [provider]  albuterol (VENTOLIN HFA) 108 (90 Base) MCG/ACT inhaler Inhale 2 puffs into the lungs every 6 (six) hours as needed for wheezing or shortness of breath. 12/10/21   Dettinger, Fransisca Kaufmann, MD  amoxicillin-clavulanate (AUGMENTIN) 875-125 MG tablet Take 1 tablet by mouth 2 (two) times daily. 03/09/22   Ivy Lynn, NP  atorvastatin (LIPITOR) 80 MG tablet TAKE 1 TABLET EVERY DAY 07/23/21   Dettinger, Fransisca Kaufmann, MD  azelastine (ASTELIN) 0.1 % nasal spray Place 1 spray into both nostrils 2 (two) times daily. Use in each nostril as directed 09/23/20   Dettinger, Fransisca Kaufmann, MD  Calcium-Phosphorus-Vitamin D ([place]) 7313696137 MG-MG-UNIT CHEW Chew 1 each by mouth 2 (two) times daily. 02/16/17   Eckard,  Tammy, RPH-CPP  Cholecalciferol (VITAMIN D3) 2000 units CHEW Chew 4,000 Units by mouth daily.    [provider]  furosemide (LASIX) 20 MG tablet Take 1 tablet (20 mg total) by mouth daily. 11/27/21   Dettinger, Fransisca Kaufmann, MD  hydrochlorothiazide (HYDRODIURIL) 25 MG tablet Take 1 tablet (25 mg total) by mouth daily. 03/26/22   Dettinger, Fransisca Kaufmann, MD  levothyroxine (SYNTHROID) 88 MCG tablet Take 1 tablet (88 mcg total) by mouth daily. 03/26/22   Dettinger, Fransisca Kaufmann, MD  metoprolol tartrate (LOPRESSOR) 25 MG tablet Take 1 tablet (25 mg total) by mouth 2 (two) times daily as needed. 07/27/21   Dettinger, Fransisca Kaufmann, MD  mirtazapine (REMERON SOL-TAB) 15 MG disintegrating tablet Take 1 tablet (15 mg total) by mouth at bedtime. 11/27/21   Dettinger, Fransisca Kaufmann, MD  Misc. Devices (COMMODE BEDSIDE) MISC Bedside commode due to tibial fracture 07/17/20   Triplett, Tammy, PA-C  Misc. Devices [facility]) MISC Pt needs wheelchair due to extremity fractures 07/17/20   Triplett, Tammy, PA-C  Misc. Devices MISC Use rolling walker to ambulate around the house as needed. 07/29/20   Mordecai Rasmussen, MD  mometasone (NASONEX) 50 MCG/ACT nasal spray Place 2 sprays into the nose daily. 03/09/22   Ivy Lynn, NP  Multiple Vitamin (MULTIVITAMIN WITH MINERALS) TABS tablet Take 2 tablets by mouth daily.     [provider]  omeprazole (PRILOSEC) 40 MG  capsule Take 1 capsule (40 mg total) by mouth daily. 07/27/21   Dettinger, Fransisca Kaufmann, MD  ondansetron (ZOFRAN ODT) 4 MG disintegrating tablet Take 1 tablet (4 mg total) by mouth every 8 (eight) hours as needed for nausea or vomiting. 05/13/21   Idol, Almyra Free, PA-C  oxazepam (SERAX) 15 MG capsule Take 1 capsule (15 mg total) by mouth 3 (three) times daily as needed for sleep. Cancel any other prescriptions on file 03/26/22   Dettinger, Fransisca Kaufmann, MD  polyethylene glycol (MIRALAX / GLYCOLAX) packet Take 17 g by mouth daily as needed for moderate constipation.     [provider]  potassium chloride 20 MEQ/15ML (10%) SOLN TAKE 7.5ML (10MEQ) BY MOUTH DAILY AS NEEDED FOR CRAMPING 03/26/22   Dettinger, Fransisca Kaufmann, MD  [facility] injection  11/27/21   [provider]  sucralfate (CARAFATE) 1 g tablet Take 1 tablet (1 g total) by mouth 2 (two) times daily. 03/26/22   Dettinger, Fransisca Kaufmann, MD  triamcinolone ointment (KENALOG) 0.5 % APPLY TO THE AFFECTED AREA(S) TOPICALLY TWICE DAILY Patient taking differently: Apply 1 application  topically 2 (two) times daily. 11/24/20   Dettinger, Fransisca Kaufmann, MD      Allergies    Bisphosphonates, Ciprofloxacin, Codeine, and Sulfa antibiotics    Review of Systems   Review of Systems  Constitutional:  Negative for fever.  HENT:  Negative for ear pain.   Eyes:  Negative for pain.  Respiratory:  Positive for cough.   Cardiovascular:  Positive for palpitations. Negative for chest pain.  Gastrointestinal:  Negative for abdominal pain.  Genitourinary:  Negative for flank pain.  Musculoskeletal:  Negative for back pain.  Skin:  Negative for rash.  Neurological:  Negative for headaches.    Physical Exam Updated Vital Signs BP 113/82   Pulse (!) 118   Temp 97.9 F (36.6 C) (Oral)   Resp (!) 24   SpO2 95%  Physical Exam Constitutional:      General: She is not in acute distress.    Appearance: Normal appearance.  HENT:     Head: Normocephalic.     Nose: Nose normal.  Eyes:     Extraocular Movements: Extraocular movements intact.  Cardiovascular:     Rate and Rhythm: Tachycardia present. Rhythm irregular.  Pulmonary:     Effort: Pulmonary effort is normal. No respiratory distress.     Breath sounds: No wheezing or rales.  Musculoskeletal:        General: Normal range of motion.     Cervical back: Normal range of motion.  Neurological:     General: No focal deficit present.     Mental Status: She is alert and oriented to person, place, and time. Mental status is at baseline.     Cranial Nerves: No cranial nerve  deficit.     Motor: No weakness.     ED Results / Procedures / Treatments   Labs (all labs ordered are listed, but only abnormal results are displayed) Labs Reviewed  COMPREHENSIVE METABOLIC PANEL - Abnormal; Notable for the following components:      Result Value   Potassium 3.2 (*)    Calcium 8.4 (*)    Albumin 3.1 (*)    GFR, Estimated 57 (*)    All other components within normal limits  TSH - Abnormal; Notable for the following components:   TSH <0.010 (*)    All other components within normal limits  MAGNESIUM - Abnormal; Notable for the following components:  Magnesium 1.3 (*)    All other components within normal limits  STOOL CULTURE  RESP PANEL BY RT-PCR (FLU A&B, COVID) ARPGX2  CBC WITH DIFFERENTIAL/PLATELET  TROPONIN I (HIGH SENSITIVITY)  TROPONIN I (HIGH SENSITIVITY)    EKG EKG Interpretation  Date/Time:  Friday March 26 2022 13:25:27 EDT Ventricular Rate:  141 PR Interval:    QRS Duration: 106 QT Interval:  346 QTC Calculation: 529 R Axis:   116 Text Interpretation: Atrial fibrillation with rapid ventricular response with premature ventricular or aberrantly conducted complexes Incomplete right bundle branch block Right ventricular hypertrophy Nonspecific ST and T wave abnormality Abnormal ECG When compared with ECG of 13-May-2021 16:29, PREVIOUS ECG IS PRESENT Confirmed by Thamas Jaegers ([id number]) on 03/26/2022 3:56:25 PM  Radiology DG Chest 2 View  Result Date: 03/26/2022 CLINICAL DATA:  Weakness. EXAM: CHEST - 2 VIEW COMPARISON:  Chest XR, 03/09/2022 FINDINGS: Cardiomediastinal silhouette is mildly enlarged. Tortuous thoracic with vascular calcifications. Hypoinflation with increased AP thoracic diameter perihilar interstitial thickening without discrete consolidation. No pleural effusion or pneumothorax. Surgical clips RIGHT. Degenerate changes spine with vertebral augmentation at midthoracic vertebra. No acute displaced fracture. IMPRESSION: 1. Obstructive  pulmonary disease without acute superimposed cardiopulmonary process. 2. Aortic Atherosclerosis (ICD10-I70.0) and Emphysema (ICD10-J43.9). Electronically Signed   By: Michaelle Birks M.D.   On: 03/26/2022 14:15    Procedures .Critical Care  Performed by: Luna Fuse, MD Authorized by: Luna Fuse, MD   Critical care provider statement:    Critical care time (minutes):  40   Critical care time was exclusive of:  Separately billable procedures and treating other patients and teaching time   Critical care was necessary to treat or prevent imminent or life-threatening deterioration of the following conditions:  Cardiac failure     Medications Ordered in ED Medications  magnesium sulfate IVPB 2 g 50 mL (2 g Intravenous New Bag/Given 03/26/22 1647)  potassium chloride 10 mEq in 100 mL IVPB (10 mEq Intravenous New Bag/Given 03/26/22 1648)  apixaban (ELIQUIS) tablet 2.5 mg (2.5 mg Oral Given 03/26/22 1652)  potassium chloride SA (KLOR-CON M) CR tablet 40 mEq (40 mEq Oral Given 03/26/22 1641)  diltiazem (CARDIZEM) injection 15 mg (15 mg Intravenous Given 03/26/22 1642)    ED Course/ Medical Decision Making/ A&P                           Medical Decision Making Amount and/or Complexity of Data Reviewed Radiology: ordered.  Risk Prescription drug management.   History obtained from family at bedside.  Cardiac monitoring shows atrial fibrillation.  Chart review shows prior primary care office visit earlier today.  Diagnosis is worsening EKG shows atrial fibrillation with rapid rate about 145 bpm.  Labs show hypokalemia hypomagnesemia, hemoglobin otherwise normal troponins are flat x2.  Patient given IV and oral potassium, IV magnesium, IV diltiazem.  Heart rate appears improved with heart rate about 110-220 bpm at this time remains in atrial fibrillation.  Patient given Eliquis 5 mg, to be mated to the hospitalist team.        Final Clinical Impression(s) / ED Diagnoses Final  diagnoses:  Atrial fibrillation with rapid ventricular response ([place])  Hypokalemia    Rx / DC Orders ED Discharge Orders     None         Luna Fuse, MD 03/26/22 1723

## 2022-03-26 NOTE — ED Triage Notes (Signed)
Pt sent from Dr. In [facility]. Brought ekg which showed a fib. Pt already has hx. Pt a/o. Ambulatory with cane and steady. Refused wc. Pt c/o weakness and unable to eat x 3 weeks. Diarrhea x 3 weeks but better now. Has been on antibiotics recently for the diarrhea. States didn't do any good. Color wnl. Mm moist.

## 2022-03-26 NOTE — ED Provider Triage Note (Signed)
Emergency Medicine Provider Triage Evaluation Note  [name] , a 81 y.o. female  was evaluated in triage.  Pt complains of new onset atrial fibrillation with RVR discovered in patient's PCP office.  She is having diarrhea, been on antibiotic for it.  Denies any specific chest pain, states she has history of "irregular heartbeat" which was not atrial fibrillation.  Not on blood thinners..  Review of Systems  Per HPI  Physical Exam  BP (!) 142/100 (BP Location: Right Arm)   Pulse (!) 138   Temp 97.9 F (36.6 C) (Oral)   Resp 17   SpO2 98%  Gen:   Awake, no distress   Resp:  Normal effort  MSK:   Moves extremities without difficulty  Other:  Tachycardic, regular rhythm  Medical Decision Making  Medically screening exam initiated at 1:52 PM.  Appropriate orders placed.  [name] was informed that the remainder of the evaluation will be completed by another provider, this initial triage assessment does not replace that evaluation, and the importance of remaining in the ED until their evaluation is complete.     Sherrill Raring, PA-C 03/26/22 1352

## 2022-03-26 NOTE — H&P (Addendum)
TRH H&P   Patient Demographics:    [name], is a 81 y.o. female  MRN: [id number]   DOB - 08-Mar-1941  Admit Date - 03/26/2022  Outpatient Primary MD for the patient is Dettinger, Fransisca Kaufmann, MD  Referring MD/NP/PA: Dr Almyra Free  Outpatient Specialists: cardiology Dr Oval Linsey    Patient coming from: home/PCP  Chief Complaint  Patient presents with   Weakness      HPI:    [name]  is a 81 y.o. female, past medical history of COPD, hyperlipidemia, vitamin D deficiency with osteoporosis, hypertension, anxiety, ED secondary to complaints of weakness, patient reports history of cough, congestion over last month, which she took a course of Augmentin without much help, she developed diarrhea after that which has significantly improved, she reports generalized weakness, fatigue, denies nausea, vomiting or any pain at this time, she was noted at her PCP office to be in A-fib with RVR so she was brought to ED. -In ED she was noted to be in A-fib with RVR heart rate in the 140s, it did improve with IV fluids and IV Cardizem push, potassium low at 3.2, magnesium low at 1.3, [facility] hospitalist consulted to admit.   Review of systems:      A full 10 point Review of Systems was done, except as stated above, all other Review of Systems were negative.   With Past History of the following :    Past Medical History:  Diagnosis Date   Anxiety states    Atrial fibrillation ([place])    Breast cyst    Cancer (HCC)    skin   Cataract    Constipation    Esophageal stricture    Essential hypertension 12/18/2015   Fracture, rib    GERD (gastroesophageal reflux disease)    Hiatal hernia    Lower extremity edema 12/18/2015   Osteoporosis    Other and unspecified hyperlipidemia    PAC (premature atrial contraction) 12/30/2015   Palpitations 12/18/2015   Unspecified hypothyroidism     Vertebral fracture, osteoporotic (HCC)       Past Surgical History:  Procedure Laterality Date   ABDOMINAL HYSTERECTOMY     APPENDECTOMY     BREAST BIOPSY     bil cysts   CATARACT EXTRACTION     ESOPHAGOGASTRODUODENOSCOPY N/A 01/19/2018   Procedure: ESOPHAGOGASTRODUODENOSCOPY (EGD);  Surgeon: Clarene Essex, MD;  Location: [facility];  Service: Endoscopy;  Laterality: N/A;  with flouro   EYE SURGERY     HAND SURGERY Left    IR GENERIC HISTORICAL  12/10/2016   IR RADIOLOGIST EVAL & MGMT 12/10/2016 [facility]   IR GENERIC HISTORICAL  12/23/2016   IR KYPHO THORACIC WITH BONE BIOPSY 12/23/2016 Luanne Bras, MD [facility]   PARTIAL HYSTERECTOMY     THYROID LOBECTOMY     right   VERTEBROPLASTY        Social  History:     Social History   Tobacco Use   Smoking status: Former    Types: Cigarettes    Quit date: 11/18/2005    Years since quitting: 16.3   Smokeless tobacco: Never   Tobacco comments:    Non-smoker  quit 12-13 years  Substance Use Topics   Alcohol use: No        Family History :     Family History  Problem Relation Age of Onset   Goiter Father    Alcohol abuse Father    Heart disease Sister    Cancer Sister        breast and skin   Goiter Brother    Dementia Brother    Hyperlipidemia Brother    Hernia Brother    Cancer Brother        throat   Cancer Brother        neck   Heart disease Brother    Early death Brother        auto accident     Home Medications:   Prior to Admission medications   Medication Sig Start Date End Date Taking? Authorizing Provider  acetaminophen (TYLENOL) 500 MG tablet Take 500 mg by mouth every 8 (eight) hours as needed for mild pain or moderate pain.   Yes [provider]  albuterol (VENTOLIN HFA) 108 (90 Base) MCG/ACT inhaler Inhale 2 puffs into the lungs every 6 (six) hours as needed for wheezing or shortness of breath. 12/10/21  Yes Dettinger, Fransisca Kaufmann, MD  atorvastatin (LIPITOR) 80 MG tablet TAKE 1  TABLET EVERY DAY 07/23/21  Yes Dettinger, Fransisca Kaufmann, MD  Cholecalciferol (VITAMIN D3) 2000 units CHEW Chew 4,000 Units by mouth daily.   Yes [provider]  furosemide (LASIX) 20 MG tablet Take 1 tablet (20 mg total) by mouth daily. 11/27/21  Yes Dettinger, Fransisca Kaufmann, MD  levothyroxine (SYNTHROID) 88 MCG tablet Take 1 tablet (88 mcg total) by mouth daily. 03/26/22  Yes Dettinger, Fransisca Kaufmann, MD  metoprolol tartrate (LOPRESSOR) 25 MG tablet Take 1 tablet (25 mg total) by mouth 2 (two) times daily as needed. Patient taking differently: Take 25 mg by mouth 2 (two) times daily as needed (irregular heart rate). 07/27/21  Yes Dettinger, Fransisca Kaufmann, MD  mometasone (NASONEX) 50 MCG/ACT nasal spray Place 2 sprays into the nose daily. 03/09/22  Yes Ivy Lynn, NP  Multiple Vitamin (MULTIVITAMIN WITH MINERALS) TABS tablet Take 2 tablets by mouth daily.    Yes [provider]  omeprazole (PRILOSEC) 40 MG capsule Take 1 capsule (40 mg total) by mouth daily. 07/27/21  Yes Dettinger, Fransisca Kaufmann, MD  ondansetron (ZOFRAN ODT) 4 MG disintegrating tablet Take 1 tablet (4 mg total) by mouth every 8 (eight) hours as needed for nausea or vomiting. 05/13/21  Yes Idol, Almyra Free, PA-C  oxazepam (SERAX) 15 MG capsule Take 1 capsule (15 mg total) by mouth 3 (three) times daily as needed for sleep. Cancel any other prescriptions on file 03/26/22  Yes Dettinger, Fransisca Kaufmann, MD  polyethylene glycol (MIRALAX / GLYCOLAX) packet Take 17 g by mouth daily as needed for moderate constipation.    Yes [provider]  potassium chloride 20 MEQ/15ML (10%) SOLN TAKE 7.5ML (10MEQ) BY MOUTH DAILY AS NEEDED FOR CRAMPING Patient taking differently: Take 10 mEq by mouth daily as needed (cramping). 03/26/22  Yes Dettinger, Fransisca Kaufmann, MD  [facility] injection  11/27/21  Yes [provider]  sucralfate (CARAFATE) 1 g tablet Take 1  tablet (1 g total) by mouth 2 (two) times daily. 03/26/22  Yes Dettinger, Fransisca Kaufmann, MD   amoxicillin-clavulanate (AUGMENTIN) 875-125 MG tablet Take 1 tablet by mouth 2 (two) times daily. Patient not taking: Reported on 03/26/2022 03/09/22   Ivy Lynn, NP  hydrochlorothiazide (HYDRODIURIL) 25 MG tablet Take 1 tablet (25 mg total) by mouth daily. Patient not taking: Reported on 03/26/2022 03/26/22   Dettinger, Fransisca Kaufmann, MD  mirtazapine (REMERON SOL-TAB) 15 MG disintegrating tablet Take 1 tablet (15 mg total) by mouth at bedtime. Patient not taking: Reported on 03/26/2022 11/27/21   Dettinger, Fransisca Kaufmann, MD  Misc. Devices (COMMODE BEDSIDE) MISC Bedside commode due to tibial fracture 07/17/20   Triplett, Tammy, PA-C  Misc. Devices [facility]) MISC Pt needs wheelchair due to extremity fractures 07/17/20   Triplett, Tammy, PA-C  Misc. Devices MISC Use rolling walker to ambulate around the house as needed. 07/29/20   Mordecai Rasmussen, MD  triamcinolone ointment (KENALOG) 0.5 % APPLY TO THE AFFECTED AREA(S) TOPICALLY TWICE DAILY Patient not taking: Reported on 03/26/2022 11/24/20   Dettinger, Fransisca Kaufmann, MD     Allergies:     Allergies  Allergen Reactions   Bisphosphonates Other (See Comments)    Difficulty swallowing    Ciprofloxacin Nausea And Vomiting   Codeine Nausea And Vomiting   Sulfa Antibiotics Nausea And Vomiting     Physical Exam:   Vitals  Blood pressure 113/82, pulse (!) 118, temperature 97.9 F (36.6 C), temperature source Oral, resp. rate (!) 24, SpO2 95 %.   1. General frail female  lying in bed in NAD,   2. Normal affect and insight, Not Suicidal or Homicidal, Awake Alert, Oriented X 3.  3. No F.N deficits, ALL C.Nerves Intact, Strength 5/5 all 4 extremities, Sensation intact all 4 extremities, Plantars down going.  4. Ears and Eyes appear Normal, Conjunctivae clear, PERRLA. Moist Oral Mucosa.  5. Supple Neck, No JVD, No cervical lymphadenopathy appriciated, No Carotid Bruits.  6. Symmetrical Chest wall movement, diminished air entry with scattered  wheezing  7.IRR IRR , No Gallops, Rubs or Murmurs, No Parasternal Heave.  8. Positive Bowel Sounds, Abdomen Soft, No tenderness, No organomegaly appriciated,No rebound -guarding or rigidity.  9.  No Cyanosis, Normal Skin Turgor, No Skin Rash or Bruise.  10. Good muscle tone,  joints appear normal , no effusions, Normal ROM.  11. No Palpable Lymph Nodes in Neck or Axillae    Data Review:    CBC Recent Labs  Lab 03/26/22 1413  WBC 9.2  HGB 12.3  HCT 38.6  PLT 327  MCV 93.0  MCH 29.6  MCHC 31.9  RDW 12.9  LYMPHSABS 1.5  MONOABS 0.7  EOSABS 0.1  BASOSABS 0.1   ------------------------------------------------------------------------------------------------------------------  Chemistries  Recent Labs  Lab 03/26/22 1413  NA 138  K 3.2*  CL 105  CO2 28  GLUCOSE 78  BUN 13  CREATININE 0.99  CALCIUM 8.4*  MG 1.3*  AST 19  ALT 10  ALKPHOS 92  BILITOT 0.7   ------------------------------------------------------------------------------------------------------------------ estimated creatinine clearance is 32.4 mL/min (by C-G formula based on SCr of 0.99 mg/dL). ------------------------------------------------------------------------------------------------------------------ Recent Labs    03/26/22 1413  TSH <0.010*    Coagulation profile No results for input(s): "INR", "PROTIME" in the last 168 hours. ------------------------------------------------------------------------------------------------------------------- No results for input(s): "DDIMER" in the last 72 hours. -------------------------------------------------------------------------------------------------------------------  Cardiac Enzymes No results for input(s): "CKMB", "TROPONINI", "MYOGLOBIN" in the last 168 hours.  Invalid input(s): "CK" ------------------------------------------------------------------------------------------------------------------    Component  Value Date/Time   BNP 132.7  (H) 04/08/2017 1144     ---------------------------------------------------------------------------------------------------------------  Urinalysis    Component Value Date/Time   COLORURINE YELLOW 05/13/2021 1819   APPEARANCEUR CLEAR 05/13/2021 1819   APPEARANCEUR Clear 04/05/2018 1643   LABSPEC 1.033 (H) 05/13/2021 1819   PHURINE 6.0 05/13/2021 1819   GLUCOSEU NEGATIVE 05/13/2021 1819   HGBUR NEGATIVE 05/13/2021 1819   BILIRUBINUR NEGATIVE 05/13/2021 1819   BILIRUBINUR Negative 04/05/2018 1643   KETONESUR NEGATIVE 05/13/2021 1819   PROTEINUR NEGATIVE 05/13/2021 1819   UROBILINOGEN negative 09/25/2014 1147   NITRITE NEGATIVE 05/13/2021 1819   LEUKOCYTESUR NEGATIVE 05/13/2021 1819    ----------------------------------------------------------------------------------------------------------------   Imaging Results:    DG Chest 2 View  Result Date: 03/26/2022 CLINICAL DATA:  Weakness. EXAM: CHEST - 2 VIEW COMPARISON:  Chest XR, 03/09/2022 FINDINGS: Cardiomediastinal silhouette is mildly enlarged. Tortuous thoracic with vascular calcifications. Hypoinflation with increased AP thoracic diameter perihilar interstitial thickening without discrete consolidation. No pleural effusion or pneumothorax. Surgical clips RIGHT. Degenerate changes spine with vertebral augmentation at midthoracic vertebra. No acute displaced fracture. IMPRESSION: 1. Obstructive pulmonary disease without acute superimposed cardiopulmonary process. 2. Aortic Atherosclerosis (ICD10-I70.0) and Emphysema (ICD10-J43.9). Electronically Signed   By: Michaelle Birks M.D.   On: 03/26/2022 14:15    My personal review of EKG: Rhythm A fib with RVR, Rate  141 /min, QTc 529 , no Acute ST changes   Assessment & Plan:    Principal Problem:   Atrial fibrillation with RVR (HCC) Active Problems:   GERD (gastroesophageal reflux disease)   Hyperlipemia   Hypothyroid   Vitamin D deficiency   Generalized anxiety disorder   Essential  hypertension   A-fib with RVR -This is new onset, she had history of PAC in the past, -Admitted under A-fib pathway, will check TSH, she received IV Cardizem in ED, will start on scheduled metoprolol, and as needed Cardizem if needed. -Her CHA2DS2-VASc score> 2, started on Eliquis 2.5 mg p.o. twice daily(given age and weight). -Obtain 2D echo. -Telemetry. -He is with significant hypokalemia and hypomagnesemia likely contributing to had A-fib.  Will correct for target potassium> 4 and magnesium> 2  Hypomagnesemia -Magnesium of 1.3, monitor on telemetry, repleting, recheck in a.m.  Hypokalemia -Potassium of 3.2, repleting  COPD with mild exacerbation -Is with mild cough, minimal wheezing, will give 1 dose of prednisone and scheduled ipratropium, will avoid albuterol given A-fib with RVR  Hypothyroidism -We will check TSH and continue with home dose Synthroid  Anxiety -Continue with home medications  GERD -Continue with PPI.  Generalized weakness -Multifactorial in the setting of recent upper respiratory infection, diarrhea, hypokalemia and hypomagnesemia as well A-fib with RVR, will consult PT.  Hypertension -We will start on scheduled metoprolol for heart rate control  Vitamin D deficiency -Continue with supplements  Addendum: TSH significantly  low at<0.010,. Likely contributing to her A-fib with RVR, will hold Synthroid and will resume in 3 days at the half dose  DVT Prophylaxis Eliquis  AM Labs Ordered, also please review Full Orders  Family Communication: Admission, patients condition and plan of care including tests being ordered have been discussed with the patient and Husband at bedside who indicate understanding and agree with the plan and Code Status.  Code Status full  Likely DC to  home  Condition GUARDED    Consults called: None    Admission status: observation    Time spent in minutes : 70 minutes   Phillips Climes M.D on 03/26/2022 at 6:10  PM  [facility] - Office  [phone]

## 2022-03-27 ENCOUNTER — Observation Stay (HOSPITAL_BASED_OUTPATIENT_CLINIC_OR_DEPARTMENT_OTHER): Payer: Medicare HMO

## 2022-03-27 ENCOUNTER — Other Ambulatory Visit (HOSPITAL_COMMUNITY): Payer: Self-pay | Admitting: *Deleted

## 2022-03-27 ENCOUNTER — Encounter (HOSPITAL_COMMUNITY): Payer: Self-pay | Admitting: Internal Medicine

## 2022-03-27 DIAGNOSIS — I4891 Unspecified atrial fibrillation: Secondary | ICD-10-CM

## 2022-03-27 DIAGNOSIS — F411 Generalized anxiety disorder: Secondary | ICD-10-CM

## 2022-03-27 DIAGNOSIS — I10 Essential (primary) hypertension: Secondary | ICD-10-CM | POA: Diagnosis not present

## 2022-03-27 DIAGNOSIS — E876 Hypokalemia: Secondary | ICD-10-CM | POA: Diagnosis not present

## 2022-03-27 LAB — BASIC METABOLIC PANEL
Anion gap: 7 (ref 5–15)
BUN: 17 mg/dL (ref 8–23)
CO2: 23 mmol/L (ref 22–32)
Calcium: 8.7 mg/dL — ABNORMAL LOW (ref 8.9–10.3)
Chloride: 104 mmol/L (ref 98–111)
Creatinine, Ser: 0.95 mg/dL (ref 0.44–1.00)
GFR, Estimated: 59 mL/min — ABNORMAL LOW (ref >60)
Glucose, Bld: 129 mg/dL — ABNORMAL HIGH (ref 70–99)
Potassium: 5.7 mmol/L — ABNORMAL HIGH (ref 3.5–5.1)
Sodium: 134 mmol/L — ABNORMAL LOW (ref 135–145)

## 2022-03-27 LAB — CBC
HCT: 35.6 % — ABNORMAL LOW (ref 36.0–46.0)
Hemoglobin: 11.2 g/dL — ABNORMAL LOW (ref 12.0–15.0)
MCH: 29.4 pg (ref 26.0–34.0)
MCHC: 31.5 g/dL (ref 30.0–36.0)
MCV: 93.4 fL (ref 80.0–100.0)
Platelets: 280 10*3/uL (ref 150–400)
RBC: 3.81 MIL/uL — ABNORMAL LOW (ref 3.87–5.11)
RDW: 13.1 % (ref 11.5–15.5)
WBC: 5 10*3/uL (ref 4.0–10.5)
nRBC: 0 % (ref 0.0–0.2)

## 2022-03-27 LAB — ECHOCARDIOGRAM COMPLETE
Area-P 1/2: 2.54 cm2
Height: 61 in
MV M vel: 3.99 m/s
MV Peak grad: 63.7 mmHg
MV VTI: 1.12 cm2
P 1/2 time: 382 msec
S' Lateral: 2.6 cm
Weight: 1712 oz

## 2022-03-27 LAB — MAGNESIUM: Magnesium: 2.2 mg/dL (ref 1.7–2.4)

## 2022-03-27 MED ORDER — SODIUM ZIRCONIUM CYCLOSILICATE 10 G PO PACK
10.0000 g | PACK | ORAL | Status: AC
  Administered 2022-03-27: 10 g via ORAL
  Filled 2022-03-27: qty 1

## 2022-03-27 MED ORDER — LOPERAMIDE HCL 2 MG PO CAPS
2.0000 mg | ORAL_CAPSULE | Freq: Once | ORAL | Status: AC
Start: 2022-03-27 — End: 2022-03-27
  Administered 2022-03-27: 2 mg via ORAL
  Filled 2022-03-27: qty 1

## 2022-03-27 MED ORDER — LEVOTHYROXINE SODIUM 50 MCG PO TABS: 50.0000 ug | ORAL_TABLET | Freq: Every day | ORAL | 0 refills | Status: DC

## 2022-03-27 MED ORDER — APIXABAN 2.5 MG PO TABS: 2.5000 mg | ORAL_TABLET | Freq: Two times a day (BID) | ORAL | 1 refills | Status: DC

## 2022-03-27 MED ORDER — LOPERAMIDE HCL 2 MG PO CAPS: 2.0000 mg | ORAL_CAPSULE | Freq: Four times a day (QID) | ORAL | 0 refills | Status: DC | PRN

## 2022-03-27 MED ORDER — DM-GUAIFENESIN ER 30-600 MG PO TB12: 1.0000 | ORAL_TABLET | Freq: Two times a day (BID) | ORAL | 0 refills | Status: DC

## 2022-03-27 NOTE — Progress Notes (Signed)
*  PRELIMINARY RESULTS* Echocardiogram 2D Echocardiogram has been performed.  [name] 03/27/2022, 12:14 PM

## 2022-03-27 NOTE — Progress Notes (Addendum)
CSW spoke with Braulio Conte to request he review patient for [facility] PT services. [facility] accepted patient for services and will follow up after discharge.  Madilyn Fireman, MSW, LCSW Transitions of Care  Clinical Social Worker II [phone]

## 2022-03-27 NOTE — Evaluation (Signed)
Physical Therapy Evaluation Patient Details Name: [name] MRN: [id number] DOB: December 02, 1940 Today's Date: 03/27/2022  History of Present Illness  [name]  is a 81 y.o. female, past medical history of COPD, hyperlipidemia, vitamin D deficiency with osteoporosis, hypertension, anxiety, ED secondary to complaints of weakness, patient reports history of cough, congestion over last month, which she took a course of Augmentin without much help, she developed diarrhea after that which has significantly improved, she reports generalized weakness, fatigue, denies nausea, vomiting or any pain at this time, she was noted at her PCP office to be in A-fib with RVR so she was brought to ED.  -In ED she was noted to be in A-fib with RVR heart rate in the 140s, it did improve with IV fluids and IV Cardizem push, potassium low at 3.2, magnesium low at 1.3, [facility] hospitalist consulted to admit.    Clinical Impression  Patient lying in bed on therapist arrival and agreeable to therapy.  Spouse at bedside.  Patient comes supine to sit modified independently secondary to taking extra time. Sit to stand with CGA from therapist and ambulation in hallway with SPC and CGA x 80 ft.  Patient occasionally takes a misstep so discussed using a walker for safety next time we walk. Patient is agreeable.  Patient returns to bed and therapist left patient in bed with spouse at bedside; nursing notified of mobilty status.  Patient will benefit from continued skilled therapy interventions during the rest of her hospital stay and at the next recommended venue of care to address deficits and promote optimal function.        Recommendations for follow up therapy are one component of a multi-disciplinary discharge planning process, led by the attending physician.  Recommendations may be updated based on patient status, additional functional criteria and insurance authorization.  Follow Up Recommendations Home health PT     Assistance Recommended at Discharge Intermittent Supervision/Assistance  Patient can return home with the following  A little help with walking and/or transfers;A little help with bathing/dressing/bathroom;Help with stairs or ramp for entrance    Equipment Recommendations None recommended by PT  Recommendations for Other Services       Functional Status Assessment Patient has had a recent decline in their functional status and demonstrates the ability to make significant improvements in function in a reasonable and predictable amount of time.     Precautions / Restrictions Precautions Precautions: Fall Precaution Comments: uses a cane at baseline Restrictions Weight Bearing Restrictions: No      Mobility  Bed Mobility Overal bed mobility: Modified Independent             General bed mobility comments: slightly impulsive    Transfers Overall transfer level: Needs assistance Equipment used: Straight cane Transfers: Sit to/from Stand Sit to Stand: Min guard                Ambulation/Gait Ambulation/Gait assistance: Min guard Gait Distance (Feet): 80 Feet Assistive device: Straight cane Gait Pattern/deviations: Staggering left, Staggering right       General Gait Details: patient occassionally takes a misstep; able to recover with SPC and min A from therapist; discussed with patient using a rollator instead for safety  Stairs            Wheelchair Mobility    Modified Rankin (Stroke Patients Only)       Balance Overall balance assessment: Needs assistance Sitting-balance support: Feet supported Sitting balance-Leahy Scale: Good Sitting balance - Comments: good  sitting balance on EOB   Standing balance support: Single extremity supported, During functional activity, Reliant on assistive device for balance Standing balance-Leahy Scale: Fair Standing balance comment: fair standing and walking balance with therapist CGA/occassional min  assistance                             Pertinent Vitals/Pain Pain Assessment Pain Assessment: No/denies pain    Home Living Family/patient expects to be discharged to:: Private residence Living Arrangements: Spouse/significant other Available Help at Discharge: Family Type of Home: House Home Access: Level entry       Home Layout: One level Home Equipment: Advice worker (2 wheels);Wheelchair - manual;BSC/3in1;Cane - single point      Prior Function Prior Level of Function : Independent/Modified Independent                     Hand Dominance        Extremity/Trunk Assessment   Upper Extremity Assessment Upper Extremity Assessment: Generalized weakness    Lower Extremity Assessment Lower Extremity Assessment: Generalized weakness    Cervical / Trunk Assessment Cervical / Trunk Assessment: Normal  Communication   Communication: No difficulties  Cognition Arousal/Alertness: Awake/alert Behavior During Therapy: WFL for tasks assessed/performed Overall Cognitive Status: Within Functional Limits for tasks assessed                                          General Comments      Exercises     Assessment/Plan    PT Assessment Patient needs continued PT services  PT Problem List Decreased strength;Decreased knowledge of use of DME;Decreased activity tolerance;Decreased safety awareness;Decreased balance;Decreased mobility       PT Treatment Interventions DME instruction;Balance training;Gait training;Neuromuscular re-education;Functional mobility training;Patient/family education;Therapeutic activities;Therapeutic exercise    PT Goals (Current goals can be found in the Care Plan section)  Acute Rehab PT Goals Patient Stated Goal: return home PT Goal Formulation: With patient/family Time For Goal Achievement: 04/10/22 Potential to Achieve Goals: Good    Frequency Min 2X/week     Co-evaluation                AM-PAC PT "6 Clicks" Mobility  Outcome Measure Help needed turning from your back to your side while in a flat bed without using bedrails?: A Little Help needed moving from lying on your back to sitting on the side of a flat bed without using bedrails?: A Little Help needed moving to and from a bed to a chair (including a wheelchair)?: A Little Help needed standing up from a chair using your arms (e.g., wheelchair or bedside chair)?: A Little Help needed to walk in hospital room?: A Little Help needed climbing 3-5 steps with a railing? : A Lot 6 Click Score: 17    End of Session   Activity Tolerance: Patient tolerated treatment well Patient left: in bed;with family/visitor present;with call bell/phone within reach Nurse Communication: Mobility status PT Visit Diagnosis: Unsteadiness on feet (R26.81);Other abnormalities of gait and mobility (R26.89);Muscle weakness (generalized) (M62.81)    Time: 1125-1140 PT Time Calculation (min) (ACUTE ONLY): 15 min   Charges:   PT Evaluation $PT Eval Low Complexity: 1 Low PT Treatments $Therapeutic Activity: 8-22 mins        11:56 AM, 03/27/22 [place] Delauder Small Jace Fermin MPT [place] [facility] [place] [phone]  [place]

## 2022-03-27 NOTE — Discharge Summary (Signed)
Physician Discharge Summary  [name] NTI:[id number] DOB: [birth year] DOA: 03/26/2022  PCP: Dettinger, Fransisca Kaufmann, MD  Admit date: 03/26/2022 Discharge date: 03/27/2022  Admitted From: Home Disposition: Home  Recommendations for Outpatient Follow-up:  Follow up with PCP in 1-2 weeks Please obtain BMP/CBC in one week Repeat thyroid function test in the next 4 weeks  Home Health: Home health PT Equipment/Devices:  Discharge Condition: Stable CODE STATUS: Full code Diet recommendation: Heart healthy  Brief/Interim Summary: 81 year old female with a history of COPD, hypertension, admitted to the hospital with weakness.  Found to be in rapid atrial fibrillation.  She also had hypokalemia and hypomagnesemia.  TSH was noted to be significantly suppressed since she is taking levothyroxine.  Her levothyroxine dose was adjusted.  She was started on metoprolol and has since converted to sinus rhythm.  Due to her CHA2DS2-VASc score, she was started on Eliquis for anticoagulation.  The patient is feeling significantly improved today.  She feels ready for discharge home.  Discharge Diagnoses:  Principal Problem:   Atrial fibrillation with RVR (HCC) Active Problems:   GERD (gastroesophageal reflux disease)   Hyperlipemia   Hypothyroid   Vitamin D deficiency   Generalized anxiety disorder   Essential hypertension  -A fib with RVR -This is new onset, she had history of PAC in the past, -Admitted under A-fib pathway, she received Cardizem in the emergency room and was continued on scheduled metoprolol dosing -She subsequently converted to sinus rhythm -Her CHA2DS2-VASc score> 2, started on Eliquis 2.5 mg p.o. twice daily(given age and weight). -Echocardiogram shows preserved ejection fraction grade 3 diastolic dysfunction    Hypomagnesemia -Magnesium of 1.3, replaced.   Hypokalemia -Potassium of 3.2, replaced   COPD with mild exacerbation -Is with mild cough, minimal wheezing, will  give 1 dose of prednisone and scheduled ipratropium -By discharge, her respiratory status had improved.  She still has a cough, but wheezing and shortness of breath are better -Recommended to continue albuterol as needed at home   Hypothyroidism -TSH significantly low at less than 0.01 -Synthroid dose has been decreased -Repeat thyroid studies in 4 weeks   Anxiety -Continue with home medications   GERD -Continue with PPI.   Generalized weakness -Multifactorial in the setting of recent upper respiratory infection, diarrhea, hypokalemia and hypomagnesemia as well A-fib with RVR -Seen by physical therapy with recommendations for home health PT   Hypertension -Blood pressure currently stable on metoprolol   Vitamin D deficiency -Continue with supplements  Discharge Instructions  Discharge Instructions     Diet - low sodium heart healthy   Complete by: As directed    Increase activity slowly   Complete by: As directed       Allergies as of 03/27/2022       Reactions   Bisphosphonates Other (See Comments)   Difficulty swallowing   Ciprofloxacin Nausea And Vomiting   Codeine Nausea And Vomiting   Sulfa Antibiotics Nausea And Vomiting        Medication List     STOP taking these medications    amoxicillin-clavulanate 875-125 MG tablet Commonly known as: AUGMENTIN   hydrochlorothiazide 25 MG tablet Commonly known as: HYDRODIURIL       TAKE these medications    acetaminophen 500 MG tablet Commonly known as: TYLENOL Take 500 mg by mouth every 8 (eight) hours as needed for mild pain or moderate pain.   albuterol 108 (90 Base) MCG/ACT inhaler Commonly known as: VENTOLIN HFA Inhale 2 puffs into the lungs every  6 (six) hours as needed for wheezing or shortness of breath.   apixaban 2.5 MG Tabs tablet Commonly known as: ELIQUIS Take 1 tablet (2.5 mg total) by mouth 2 (two) times daily.   atorvastatin 80 MG tablet Commonly known as: LIPITOR TAKE 1 TABLET  EVERY DAY   Commode Bedside Misc Bedside commode due to tibial fracture   Wheelchair Misc Pt needs wheelchair due to extremity fractures   Misc. Devices Misc Use rolling walker to ambulate around the house as needed.   dextromethorphan-guaiFENesin 30-600 MG 12hr tablet Commonly known as: MUCINEX DM Take 1 tablet by mouth 2 (two) times daily.   furosemide 20 MG tablet Commonly known as: LASIX Take 1 tablet (20 mg total) by mouth daily.   levothyroxine 50 MCG tablet Commonly known as: SYNTHROID Take 1 tablet (50 mcg total) by mouth daily. What changed:  medication strength how much to take   loperamide 2 MG capsule Commonly known as: IMODIUM Take 1 capsule (2 mg total) by mouth 4 (four) times daily as needed for diarrhea or loose stools.   metoprolol tartrate 25 MG tablet Commonly known as: LOPRESSOR Take 1 tablet (25 mg total) by mouth 2 (two) times daily as needed. What changed: reasons to take this   mirtazapine 15 MG disintegrating tablet Commonly known as: REMERON SOL-TAB Take 1 tablet (15 mg total) by mouth at bedtime.   mometasone 50 MCG/ACT nasal spray Commonly known as: Nasonex Place 2 sprays into the nose daily.   multivitamin with minerals Tabs tablet Take 2 tablets by mouth daily.   omeprazole 40 MG capsule Commonly known as: PRILOSEC Take 1 capsule (40 mg total) by mouth daily.   ondansetron 4 MG disintegrating tablet Commonly known as: Zofran ODT Take 1 tablet (4 mg total) by mouth every 8 (eight) hours as needed for nausea or vomiting.   oxazepam 15 MG capsule Commonly known as: SERAX Take 1 capsule (15 mg total) by mouth 3 (three) times daily as needed for sleep. Cancel any other prescriptions on file   polyethylene glycol 17 g packet Commonly known as: MIRALAX / GLYCOLAX Take 17 g by mouth daily as needed for moderate constipation.   potassium chloride 20 MEQ/15ML (10%) Soln TAKE 7.5ML (10MEQ) BY MOUTH DAILY AS NEEDED FOR CRAMPING What  changed: See the new instructions.   Shingrix injection Generic drug: Zoster Vaccine Adjuvanted   sucralfate 1 g tablet Commonly known as: CARAFATE Take 1 tablet (1 g total) by mouth 2 (two) times daily.   triamcinolone ointment 0.5 % Commonly known as: KENALOG APPLY TO THE AFFECTED AREA(S) TOPICALLY TWICE DAILY   Vitamin D3 50 MCG (2000 UT) Chew Chew 4,000 Units by mouth daily.        Allergies  Allergen Reactions   Bisphosphonates Other (See Comments)    Difficulty swallowing    Ciprofloxacin Nausea And Vomiting   Codeine Nausea And Vomiting   Sulfa Antibiotics Nausea And Vomiting    Consultations:    Procedures/Studies: ECHOCARDIOGRAM COMPLETE  Result Date: 03/27/2022    ECHOCARDIOGRAM REPORT   Patient Name:   [name] Date of Exam: 03/27/2022 Medical Rec #:  [id number]        Height:       61.0 in Accession #:    [id number]       Weight:       107.0 lb Date of Birth:  [birth year]         BSA:  1.448 m Patient Age:    81 years         BP:           129/95 mmHg Patient Gender: F                HR:           88 bpm. Exam Location:  [facility] Procedure: 2D Echo, Cardiac Doppler and Color Doppler Indications:    Atrial Fibrillation I48.91  History:        Patient has prior history of Echocardiogram examinations, most                 recent 04/13/2017. Arrythmias:PAC and Palpitations; Risk                 Factors:Hypertension and Dyslipidemia. GERD.  Sonographer:    Alvino Chapel RCS Referring Phys: [place]  1. Left ventricular ejection fraction, by estimation, is 60 to 65%. The left ventricle has normal function. The left ventricle has no regional wall motion abnormalities. Left ventricular diastolic parameters are consistent with Grade III diastolic dysfunction (restrictive).  2. Right ventricular systolic function is normal. The right ventricular size is normal. There is mildly elevated pulmonary artery systolic pressure.  3. Left atrial  size was severely dilated.  4. Right atrial size was mildly dilated.  5. 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  6. Tricuspid valve regurgitation is moderate.  7. The aortic valve is tricuspid. Aortic valve regurgitation is severe. No aortic stenosis is present.  8. The inferior vena cava is normal in size with greater than 50% respiratory variability, suggesting right atrial pressure of 3 mmHg. FINDINGS  Left Ventricle: Left ventricular ejection fraction, by estimation, is 60 to 65%. The left ventricle has normal function. The left ventricle has no regional wall motion abnormalities. The left ventricular internal cavity size was normal in size. There is  no left ventricular hypertrophy. Left ventricular diastolic parameters are consistent with Grade III diastolic dysfunction (restrictive). Right Ventricle: The right ventricular size is normal. Right ventricular systolic function is normal. There is mildly elevated pulmonary artery systolic pressure. The tricuspid regurgitant velocity is 3.12 m/s, and with an assumed right atrial pressure of 3 mmHg, the estimated right ventricular systolic pressure is 61.6 mmHg. Left Atrium: Left atrial size was severely dilated. Right Atrium: Right atrial size was mildly dilated. Pericardium: There is no evidence of pericardial effusion. 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 Mild mitral valve stenosis. MV peak gradient, 17.1 mmHg. The mean mitral valve gradient is 6.0 mmHg. Tricuspid Valve: The tricuspid valve is normal in structure. Tricuspid valve regurgitation is moderate . No evidence of tricuspid stenosis. Aortic Valve: The aortic valve is tricuspid. Aortic valve regurgitation is severe. Aortic regurgitation PHT measures 382 msec. No aortic stenosis is present. Pulmonic Valve: The pulmonic valve was not well visualized. Pulmonic valve regurgitation is mild. No evidence of pulmonic stenosis.  Aorta: The aortic root is normal in size and structure. Venous: The inferior vena cava is normal in size with greater than 50% respiratory variability, suggesting right atrial pressure of 3 mmHg. IAS/Shunts: No atrial level shunt detected by color flow Doppler.  LEFT VENTRICLE PLAX 2D LVIDd:         3.70 cm   Diastology LVIDs:         2.60 cm   LV e' medial:    5.44 cm/s LV PW:  0.90 cm   LV E/e' medial:  36.9 LV IVS:        0.90 cm   LV e' lateral:   6.74 cm/s LVOT diam:     1.90 cm   LV E/e' lateral: 29.8 LV SV:         57 LV SV Index:   39 LVOT Area:     2.84 cm  RIGHT VENTRICLE RV S prime:     9.68 cm/s TAPSE (M-mode): 1.9 cm LEFT ATRIUM             Index        RIGHT ATRIUM           Index LA diam:        3.90 cm 2.69 cm/m   RA Area:     17.60 cm LA Vol (A2C):   72.9 ml 50.35 ml/m  RA Volume:   51.50 ml  35.57 ml/m LA Vol (A4C):   85.4 ml 58.98 ml/m LA Biplane Vol: 80.1 ml 55.32 ml/m  AORTIC VALVE LVOT Vmax:   110.00 cm/s LVOT Vmean:  70.000 cm/s LVOT VTI:    0.201 m AI PHT:      382 msec  AORTA Ao Root diam: 3.00 cm MITRAL VALVE                TRICUSPID VALVE MV Area (PHT): 2.54 cm     TR Peak grad:   38.9 mmHg MV Area VTI:   1.12 cm     TR Vmax:        312.00 cm/s MV Peak grad:  17.1 mmHg MV Mean grad:  6.0 mmHg     SHUNTS MV Vmax:       2.07 m/s     Systemic VTI:  0.20 m MV Vmean:      113.0 cm/s   Systemic Diam: 1.90 cm MV Decel Time: 299 msec MR Peak grad: 63.7 mmHg MR Mean grad: 44.0 mmHg MR Vmax:      399.00 cm/s MR Vmean:     316.0 cm/s MV E velocity: 201.00 cm/s MV A velocity: 55.50 cm/s MV E/A ratio:  3.62 Kirk Ruths MD Electronically signed by Kirk Ruths MD Signature Date/Time: 03/27/2022/12:30:35 PM    Final    DG Chest 2 View  Result Date: 03/26/2022 CLINICAL DATA:  Weakness. EXAM: CHEST - 2 VIEW COMPARISON:  Chest XR, 03/09/2022 FINDINGS: Cardiomediastinal silhouette is mildly enlarged. Tortuous thoracic with vascular calcifications. Hypoinflation with increased AP  thoracic diameter perihilar interstitial thickening without discrete consolidation. No pleural effusion or pneumothorax. Surgical clips RIGHT. Degenerate changes spine with vertebral augmentation at midthoracic vertebra. No acute displaced fracture. IMPRESSION: 1. Obstructive pulmonary disease without acute superimposed cardiopulmonary process. 2. Aortic Atherosclerosis (ICD10-I70.0) and Emphysema (ICD10-J43.9). Electronically Signed   By: Michaelle Birks M.D.   On: 03/26/2022 14:15   DG Chest 2 View  Result Date: 03/09/2022 CLINICAL DATA:  Wheezing EXAM: CHEST - 2 VIEW COMPARISON:  August 07, 2018 FINDINGS: Stable cardiomegaly. The hila and mediastinum are unchanged. No pneumothorax. No nodules or masses. The patient is status post vertebroplasty of a midthoracic vertebral body. Anterior wedging of other thoracic vertebral bodies is stable. There is a kyphotic curvature to the thoracic spine. No other acute abnormalities. IMPRESSION: No active cardiopulmonary disease. Electronically Signed   By: Dorise Bullion III M.D.   On: 03/09/2022 18:47      Subjective: Had some loose stools earlier today, but overall reports feeling better  Discharge Exam: Vitals:  03/27/22 0615 03/27/22 0805 03/27/22 1257 03/27/22 1406  BP: (!) 129/95   112/73  Pulse: 88   91  Resp: 17   16  Temp: 98.7 F (37.1 C)   97.7 F (36.5 C)  TempSrc: Oral     SpO2: 96% 94% 93% 97%  Weight:      Height:        General: Pt is alert, awake, not in acute distress Cardiovascular: RRR, S1/S2 +, no rubs, no gallops Respiratory: CTA bilaterally, no wheezing, no rhonchi Abdominal: Soft, NT, ND, bowel sounds + Extremities: no edema, no cyanosis    The results of significant diagnostics from this hospitalization (including imaging, microbiology, ancillary and laboratory) are listed below for reference.     Microbiology: Recent Results (from the past 240 hour(s))  Resp Panel by RT-PCR (Flu A&B, Covid) Anterior Nasal Swab      Status: None   Collection Time: 03/26/22  4:31 PM   Specimen: Anterior Nasal Swab  Result Value Ref Range Status   SARS Coronavirus 2 by RT PCR NEGATIVE NEGATIVE Final    Comment: (NOTE) SARS-CoV-2 target nucleic acids are NOT DETECTED.  The SARS-CoV-2 RNA is generally detectable in upper respiratory specimens during the acute phase of infection. The lowest concentration of SARS-CoV-2 viral copies this assay can detect is 138 copies/mL. A negative result does not preclude SARS-Cov-2 infection and should not be used as the sole basis for treatment or other patient management decisions. A negative result may occur with  improper specimen collection/handling, submission of specimen other than nasopharyngeal swab, presence of viral mutation(s) within the areas targeted by this assay, and inadequate number of viral copies(<138 copies/mL). A negative result must be combined with clinical observations, patient history, and epidemiological information. The expected result is Negative.  Fact Sheet for Patients:  EntrepreneurPulse.com.au  Fact Sheet for Healthcare Providers:  IncredibleEmployment.be  This test is no t yet approved or cleared by the [place] FDA and  has been authorized for detection and/or diagnosis of SARS-CoV-2 by FDA under an Emergency Use Authorization (EUA). This EUA will remain  in effect (meaning this test can be used) for the duration of the COVID-19 declaration under Section 564(b)(1) of the Act, 21 U.S.C.section 360bbb-3(b)(1), unless the authorization is terminated  or revoked sooner.       Influenza A by PCR NEGATIVE NEGATIVE Final   Influenza B by PCR NEGATIVE NEGATIVE Final    Comment: (NOTE) The Xpert Xpress SARS-CoV-2/FLU/RSV plus assay is intended as an aid in the diagnosis of influenza from Nasopharyngeal swab specimens and should not be used as a sole basis for treatment. Nasal washings and aspirates are  unacceptable for Xpert Xpress SARS-CoV-2/FLU/RSV testing.  Fact Sheet for Patients: EntrepreneurPulse.com.au  Fact Sheet for Healthcare Providers: IncredibleEmployment.be  This test is not yet approved or cleared by the [place] FDA and has been authorized for detection and/or diagnosis of SARS-CoV-2 by FDA under an Emergency Use Authorization (EUA). This EUA will remain in effect (meaning this test can be used) for the duration of the COVID-19 declaration under Section 564(b)(1) of the Act, 21 U.S.C. section 360bbb-3(b)(1), unless the authorization is terminated or revoked.  Performed at [facility], [address], [place], [place] [address]      Labs: BNP (last 3 results) No results for input(s): "BNP" in the last 8760 hours. Basic Metabolic Panel: Recent Labs  Lab 03/26/22 1413 03/27/22 1101  NA 138 134*  K 3.2* 5.7*  CL 105 104  CO2 28 23  GLUCOSE 78 129*  BUN 13 17  CREATININE 0.99 0.95  CALCIUM 8.4* 8.7*  MG 1.3* 2.2   Liver Function Tests: Recent Labs  Lab 03/26/22 1413  AST 19  ALT 10  ALKPHOS 92  BILITOT 0.7  PROT 7.1  ALBUMIN 3.1*   No results for input(s): "LIPASE", "AMYLASE" in the last 168 hours. No results for input(s): "AMMONIA" in the last 168 hours. CBC: Recent Labs  Lab 03/26/22 1413 03/27/22 0525  WBC 9.2 5.0  NEUTROABS 6.8  --   HGB 12.3 11.2*  HCT 38.6 35.6*  MCV 93.0 93.4  PLT 327 280   Cardiac Enzymes: No results for input(s): "CKTOTAL", "CKMB", "CKMBINDEX", "TROPONINI" in the last 168 hours. BNP: Invalid input(s): "POCBNP" CBG: No results for input(s): "GLUCAP" in the last 168 hours. D-Dimer No results for input(s): "DDIMER" in the last 72 hours. Hgb A1c No results for input(s): "HGBA1C" in the last 72 hours. Lipid Profile No results for input(s): "CHOL", "HDL", "LDLCALC", "TRIG", "CHOLHDL", "LDLDIRECT" in the last 72 hours. Thyroid function studies Recent Labs     03/26/22 1413  TSH <0.010*   Anemia work up No results for input(s): "VITAMINB12", "FOLATE", "FERRITIN", "TIBC", "IRON", "RETICCTPCT" in the last 72 hours. Urinalysis    Component Value Date/Time   COLORURINE YELLOW 05/13/2021 1819   APPEARANCEUR CLEAR 05/13/2021 1819   APPEARANCEUR Clear 04/05/2018 1643   LABSPEC 1.033 (H) 05/13/2021 1819   PHURINE 6.0 05/13/2021 1819   GLUCOSEU NEGATIVE 05/13/2021 1819   HGBUR NEGATIVE 05/13/2021 1819   BILIRUBINUR NEGATIVE 05/13/2021 1819   BILIRUBINUR Negative 04/05/2018 1643   KETONESUR NEGATIVE 05/13/2021 1819   PROTEINUR NEGATIVE 05/13/2021 1819   UROBILINOGEN negative 09/25/2014 1147   NITRITE NEGATIVE 05/13/2021 1819   LEUKOCYTESUR NEGATIVE 05/13/2021 1819   Sepsis Labs Recent Labs  Lab 03/26/22 1413 03/27/22 0525  WBC 9.2 5.0   Microbiology Recent Results (from the past 240 hour(s))  Resp Panel by RT-PCR (Flu A&B, Covid) Anterior Nasal Swab     Status: None   Collection Time: 03/26/22  4:31 PM   Specimen: Anterior Nasal Swab  Result Value Ref Range Status   SARS Coronavirus 2 by RT PCR NEGATIVE NEGATIVE Final    Comment: (NOTE) SARS-CoV-2 target nucleic acids are NOT DETECTED.  The SARS-CoV-2 RNA is generally detectable in upper respiratory specimens during the acute phase of infection. The lowest concentration of SARS-CoV-2 viral copies this assay can detect is 138 copies/mL. A negative result does not preclude SARS-Cov-2 infection and should not be used as the sole basis for treatment or other patient management decisions. A negative result may occur with  improper specimen collection/handling, submission of specimen other than nasopharyngeal swab, presence of viral mutation(s) within the areas targeted by this assay, and inadequate number of viral copies(<138 copies/mL). A negative result must be combined with clinical observations, patient history, and epidemiological information. The expected result is  Negative.  Fact Sheet for Patients:  EntrepreneurPulse.com.au  Fact Sheet for Healthcare Providers:  IncredibleEmployment.be  This test is no t yet approved or cleared by the [place] FDA and  has been authorized for detection and/or diagnosis of SARS-CoV-2 by FDA under an Emergency Use Authorization (EUA). This EUA will remain  in effect (meaning this test can be used) for the duration of the COVID-19 declaration under Section 564(b)(1) of the Act, 21 U.S.C.section 360bbb-3(b)(1), unless the authorization is terminated  or revoked sooner.       Influenza A by  PCR NEGATIVE NEGATIVE Final   Influenza B by PCR NEGATIVE NEGATIVE Final    Comment: (NOTE) The Xpert Xpress SARS-CoV-2/FLU/RSV plus assay is intended as an aid in the diagnosis of influenza from Nasopharyngeal swab specimens and should not be used as a sole basis for treatment. Nasal washings and aspirates are unacceptable for Xpert Xpress SARS-CoV-2/FLU/RSV testing.  Fact Sheet for Patients: EntrepreneurPulse.com.au  Fact Sheet for Healthcare Providers: IncredibleEmployment.be  This test is not yet approved or cleared by the [place] FDA and has been authorized for detection and/or diagnosis of SARS-CoV-2 by FDA under an Emergency Use Authorization (EUA). This EUA will remain in effect (meaning this test can be used) for the duration of the COVID-19 declaration under Section 564(b)(1) of the Act, 21 U.S.C. section 360bbb-3(b)(1), unless the authorization is terminated or revoked.  Performed at [facility], [address], [place], [place] [address]      Time coordinating discharge: 25mns  SIGNED:   JKathie Dike MD  [facility] 03/27/2022, 7:19 PM   If 7PM-7AM, please contact night-coverage [email or web address]

## 2022-03-27 NOTE — Progress Notes (Signed)
All LDAs removed.  Reviewed discharge instructions with pt/spouse.  All questions answered including med changes and follow up appointments.  Copy provided.  All belongings returned.  Pt taken via wheelchair to POV to go home.

## 2022-03-27 NOTE — Plan of Care (Signed)
  Problem: Acute Rehab PT Goals(only PT should resolve) Goal: Pt Will Go Supine/Side To Sit Outcome: Progressing Flowsheets (Taken 03/27/2022 1157) Pt will go Supine/Side to Sit: Independently Goal: Patient Will Transfer Sit To/From Stand Outcome: Progressing Flowsheets (Taken 03/27/2022 1157) Patient will transfer sit to/from stand: Independently Goal: Pt Will Transfer Bed To Chair/Chair To Bed Outcome: Progressing Flowsheets (Taken 03/27/2022 1157) Pt will Transfer Bed to Chair/Chair to Bed: min guard assist Goal: Pt Will Ambulate Outcome: Progressing Flowsheets (Taken 03/27/2022 1157) Pt will Ambulate:  100 feet  with supervision  with least restrictive assistive device

## 2022-03-29 ENCOUNTER — Telehealth: Payer: Self-pay

## 2022-03-29 NOTE — Telephone Encounter (Signed)
Transition Care Management Follow-up Telephone Call Date of discharge and from where: 03/27/22 - [facility].Fib w/ RVR How have you been since you were released from the hospital? She feels so much better - has more energy Any questions or concerns? No  Items Reviewed: Did the pt receive and understand the discharge instructions provided? Yes  Medications obtained and verified? Yes  Other? No  Any new allergies since your discharge? No  Dietary orders reviewed? Yes Do you have support at home? Yes   Home Care and Equipment/Supplies: Were home health services ordered? She thinks so - but isn't sure - hasn't heard anything If so, what is the name of the agency? unknown  Has the agency set up a time to come to the patient's home? no Were any new equipment or medical supplies ordered?  No  Functional Questionnaire: (I = Independent and D = Dependent) ADLs: I  Bathing/Dressing- I  Meal Prep- I  Eating- I  Maintaining continence- I  Transferring/Ambulation- I  Managing Meds- I  Follow up appointments reviewed:  PCP Hospital f/u appt confirmed? Yes  Scheduled to see Dettinger on 6/22 @ 9:20. [facility] f/u appt confirmed? No  waiting for cardiologist to call her back Are transportation arrangements needed? No  If their condition worsens, is the pt aware to call PCP or go to the Emergency Dept.? Yes Was the patient provided with contact information for the PCP's office or ED? Yes Was to pt encouraged to call back with questions or concerns? Yes

## 2022-04-08 ENCOUNTER — Ambulatory Visit (INDEPENDENT_AMBULATORY_CARE_PROVIDER_SITE_OTHER): Payer: Medicare HMO | Admitting: Family Medicine

## 2022-04-08 ENCOUNTER — Encounter: Payer: Self-pay | Admitting: Family Medicine

## 2022-04-08 VITALS — BP 110/64 | HR 74 | Temp 97.4°F | Ht 61.0 in | Wt 103.0 lb

## 2022-04-08 DIAGNOSIS — I4891 Unspecified atrial fibrillation: Secondary | ICD-10-CM | POA: Diagnosis not present

## 2022-04-08 MED ORDER — APIXABAN 2.5 MG PO TABS: 2.5000 mg | ORAL_TABLET | Freq: Two times a day (BID) | ORAL | 3 refills | Status: DC

## 2022-04-08 NOTE — Addendum Note (Signed)
Addended by: Alphonzo Dublin on: 04/08/2022 10:07 AM   Modules accepted: Orders

## 2022-04-08 NOTE — Progress Notes (Signed)
BP 110/64   Pulse 74   Temp (!) 97.4 F (36.3 C)   Ht 5' 1"  (1.549 m)   Wt 103 lb (46.7 kg)   SpO2 97%   BMI 19.46 kg/m    Subjective:   Patient ID: [name], female    DOB: [birth year], 81 y.o.   MRN: [id number]  HPI: [name] is a 81 y.o. female presenting on 04/08/2022 for Hospitalization Follow-up and Atrial Fibrillation (New onset)   HPI Transition Care Management Follow-up Telephone Call Date of discharge and from where: 03/27/22 - [facility] - A.Fib w/ RVR How have you been since you were released from the hospital? She feels so much better - has more energy Any questions or concerns? No Patient contacted by Adalberto Cole LPN on 3/38/2505.   Transition of care office visit Patient was actually sent to the hospital from our office for new A-fib with RVR.  She was in the emergency department and hospital from 03/26/2022 until 03/27/2022.  Her thyroid dose was adjusted with thoughts that that could be a factor into the A-fib.  She was started on Eliquis and metoprolol.  Her energy is still low but improving and she is feeling a lot better.  She feels like she does not have her heart rate beating away like it was before.  She does feel like her breathing is doing well and denies any chest pain or palpitations.  She denies any new bleeding or anything with the blood thinner.  Relevant past medical, surgical, family and social history reviewed and updated as indicated. Interim medical history since our last visit reviewed. Allergies and medications reviewed and updated.  Review of Systems  Constitutional:  Positive for fatigue. Negative for chills and fever.  HENT:  Negative for congestion, ear discharge and ear pain.   Eyes:  Negative for visual disturbance.  Respiratory:  Negative for chest tightness and shortness of breath.   Cardiovascular:  Negative for chest pain and leg swelling.  Musculoskeletal:  Negative for back pain and gait problem.  Skin:  Negative for  rash.  Neurological:  Negative for weakness, light-headedness, numbness and headaches.  Psychiatric/Behavioral:  Negative for agitation and behavioral problems.   All other systems reviewed and are negative.   Per HPI unless specifically indicated above   Allergies as of 04/08/2022       Reactions   Bisphosphonates Other (See Comments)   Difficulty swallowing   Ciprofloxacin Nausea And Vomiting   Codeine Nausea And Vomiting   Sulfa Antibiotics Nausea And Vomiting        Medication List        Accurate as of April 08, 2022 10:00 AM. If you have any questions, ask your nurse or doctor.          acetaminophen 500 MG tablet Commonly known as: TYLENOL Take 500 mg by mouth every 8 (eight) hours as needed for mild pain or moderate pain.   albuterol 108 (90 Base) MCG/ACT inhaler Commonly known as: VENTOLIN HFA Inhale 2 puffs into the lungs every 6 (six) hours as needed for wheezing or shortness of breath.   apixaban 2.5 MG Tabs tablet Commonly known as: ELIQUIS Take 1 tablet (2.5 mg total) by mouth 2 (two) times daily.   atorvastatin 80 MG tablet Commonly known as: LIPITOR TAKE 1 TABLET EVERY DAY   Commode Bedside Misc Bedside commode due to tibial fracture   Wheelchair Misc Pt needs wheelchair due to extremity fractures   Misc.  Devices Misc Use rolling walker to ambulate around the house as needed.   dextromethorphan-guaiFENesin 30-600 MG 12hr tablet Commonly known as: MUCINEX DM Take 1 tablet by mouth 2 (two) times daily.   furosemide 20 MG tablet Commonly known as: LASIX Take 1 tablet (20 mg total) by mouth daily.   levothyroxine 50 MCG tablet Commonly known as: SYNTHROID Take 1 tablet (50 mcg total) by mouth daily.   loperamide 2 MG capsule Commonly known as: IMODIUM Take 1 capsule (2 mg total) by mouth 4 (four) times daily as needed for diarrhea or loose stools.   metoprolol tartrate 25 MG tablet Commonly known as: LOPRESSOR Take 1 tablet (25 mg  total) by mouth 2 (two) times daily as needed. What changed: reasons to take this   mirtazapine 15 MG disintegrating tablet Commonly known as: REMERON SOL-TAB Take 1 tablet (15 mg total) by mouth at bedtime.   mometasone 50 MCG/ACT nasal spray Commonly known as: Nasonex Place 2 sprays into the nose daily.   multivitamin with minerals Tabs tablet Take 2 tablets by mouth daily.   omeprazole 40 MG capsule Commonly known as: PRILOSEC Take 1 capsule (40 mg total) by mouth daily.   ondansetron 4 MG disintegrating tablet Commonly known as: Zofran ODT Take 1 tablet (4 mg total) by mouth every 8 (eight) hours as needed for nausea or vomiting.   oxazepam 15 MG capsule Commonly known as: SERAX Take 1 capsule (15 mg total) by mouth 3 (three) times daily as needed for sleep. Cancel any other prescriptions on file   polyethylene glycol 17 g packet Commonly known as: MIRALAX / GLYCOLAX Take 17 g by mouth daily as needed for moderate constipation.   potassium chloride 20 MEQ/15ML (10%) Soln TAKE 7.5ML (10MEQ) BY MOUTH DAILY AS NEEDED FOR CRAMPING What changed: See the new instructions.   Shingrix injection Generic drug: Zoster Vaccine Adjuvanted   sucralfate 1 g tablet Commonly known as: CARAFATE Take 1 tablet (1 g total) by mouth 2 (two) times daily.   triamcinolone ointment 0.5 % Commonly known as: KENALOG APPLY TO THE AFFECTED AREA(S) TOPICALLY TWICE DAILY   Vitamin D3 50 MCG (2000 UT) Chew Chew 4,000 Units by mouth daily.         Objective:   BP 110/64   Pulse 74   Temp (!) 97.4 F (36.3 C)   Ht 5' 1"  (1.549 m)   Wt 103 lb (46.7 kg)   SpO2 97%   BMI 19.46 kg/m   Wt Readings from Last 3 Encounters:  04/08/22 103 lb (46.7 kg)  03/26/22 107 lb (48.5 kg)  03/26/22 105 lb (47.6 kg)    Physical Exam Vitals and nursing note reviewed.  Constitutional:      General: She is not in acute distress.    Appearance: She is well-developed. She is not diaphoretic.  Eyes:      Conjunctiva/sclera: Conjunctivae normal.  Cardiovascular:     Rate and Rhythm: Normal rate and regular rhythm.     Heart sounds: Normal heart sounds. No murmur heard. Pulmonary:     Effort: Pulmonary effort is normal. No respiratory distress.     Breath sounds: Normal breath sounds. No wheezing or rhonchi.  Musculoskeletal:        General: Normal range of motion.  Skin:    General: Skin is warm and dry.     Findings: No rash.  Neurological:     Mental Status: She is alert and oriented to person, place, and time.  Coordination: Coordination normal.  Psychiatric:        Behavior: Behavior normal.       Assessment & Plan:   Problem List Items Addressed This Visit       Cardiovascular and Mediastinum   Atrial fibrillation with RVR ([place]) - Primary   Relevant Medications   apixaban (ELIQUIS) 2.5 MG TABS tablet   Other Relevant Orders   CBC with Differential/Platelet   CMP14+EGFR   Magnesium   Other Visit Diagnoses     New onset a-fib ([facility])       Relevant Medications   apixaban (ELIQUIS) 2.5 MG TABS tablet   Other Relevant Orders   CBC with Differential/Platelet   CMP14+EGFR   Magnesium       We will check blood work today, continue the Eliquis.  We will recheck her thyroid in 1 to 2 months, have her come back then. Follow up plan: Return if symptoms worsen or fail to improve, for 1 to 38-monththyroid recheck.  Counseling provided for all of the vaccine components Orders Placed This Encounter  Procedures   CBC with Differential/Platelet   CMP14+EGFR   Magnesium    JCaryl Pina MD [facility] 04/08/2022, 10:00 AM

## 2022-04-09 LAB — CMP14+EGFR
ALT: 14 IU/L (ref 0–32)
AST: 20 IU/L (ref 0–40)
Albumin/Globulin Ratio: 1.2 (ref 1.2–2.2)
Albumin: 3.7 g/dL (ref 3.6–4.6)
Alkaline Phosphatase: 104 IU/L (ref 44–121)
BUN/Creatinine Ratio: 10 — ABNORMAL LOW (ref 12–28)
BUN: 11 mg/dL (ref 8–27)
Bilirubin Total: 0.6 mg/dL (ref 0.0–1.2)
CO2: 27 mmol/L (ref 20–29)
Calcium: 9.4 mg/dL (ref 8.7–10.3)
Chloride: 102 mmol/L (ref 96–106)
Creatinine, Ser: 1.09 mg/dL — ABNORMAL HIGH (ref 0.57–1.00)
Globulin, Total: 3 g/dL (ref 1.5–4.5)
Glucose: 84 mg/dL (ref 70–99)
Potassium: 4.5 mmol/L (ref 3.5–5.2)
Sodium: 143 mmol/L (ref 134–144)
Total Protein: 6.7 g/dL (ref 6.0–8.5)
eGFR: 50 mL/min/{1.73_m2} — ABNORMAL LOW (ref >59)

## 2022-04-09 LAB — CBC WITH DIFFERENTIAL/PLATELET
Basophils Absolute: 0.1 10*3/uL (ref 0.0–0.2)
Basos: 1 %
EOS (ABSOLUTE): 0.2 10*3/uL (ref 0.0–0.4)
Eos: 4 %
Hematocrit: 38.4 % (ref 34.0–46.6)
Hemoglobin: 12.3 g/dL (ref 11.1–15.9)
Immature Grans (Abs): 0 10*3/uL (ref 0.0–0.1)
Immature Granulocytes: 0 %
Lymphocytes Absolute: 1.7 10*3/uL (ref 0.7–3.1)
Lymphs: 26 %
MCH: 29.1 pg (ref 26.6–33.0)
MCHC: 32 g/dL (ref 31.5–35.7)
MCV: 91 fL (ref 79–97)
Monocytes Absolute: 0.6 10*3/uL (ref 0.1–0.9)
Monocytes: 9 %
Neutrophils Absolute: 3.9 10*3/uL (ref 1.4–7.0)
Neutrophils: 60 %
Platelets: 284 10*3/uL (ref 150–450)
RBC: 4.23 x10E6/uL (ref 3.77–5.28)
RDW: 12.3 % (ref 11.7–15.4)
WBC: 6.5 10*3/uL (ref 3.4–10.8)

## 2022-04-09 LAB — MAGNESIUM: Magnesium: 1.7 mg/dL (ref 1.6–2.3)

## 2022-04-12 ENCOUNTER — Telehealth: Payer: Self-pay | Admitting: Family Medicine

## 2022-04-12 NOTE — Telephone Encounter (Signed)
Pt called requesting to speak with nurse regarding her blood thinner medication. Says she is being told it is going to cost her almost $200 to fill and she cant afford that.  Please advise.

## 2022-04-14 ENCOUNTER — Other Ambulatory Visit: Payer: Self-pay

## 2022-04-14 ENCOUNTER — Ambulatory Visit (INDEPENDENT_AMBULATORY_CARE_PROVIDER_SITE_OTHER): Payer: Medicare HMO

## 2022-04-14 ENCOUNTER — Telehealth: Payer: Self-pay

## 2022-04-14 DIAGNOSIS — I491 Atrial premature depolarization: Secondary | ICD-10-CM | POA: Diagnosis not present

## 2022-04-14 DIAGNOSIS — I4891 Unspecified atrial fibrillation: Secondary | ICD-10-CM | POA: Diagnosis not present

## 2022-04-14 DIAGNOSIS — I119 Hypertensive heart disease without heart failure: Secondary | ICD-10-CM

## 2022-04-14 DIAGNOSIS — E039 Hypothyroidism, unspecified: Secondary | ICD-10-CM

## 2022-04-14 DIAGNOSIS — I700 Atherosclerosis of aorta: Secondary | ICD-10-CM

## 2022-04-14 DIAGNOSIS — J439 Emphysema, unspecified: Secondary | ICD-10-CM | POA: Diagnosis not present

## 2022-04-14 DIAGNOSIS — E559 Vitamin D deficiency, unspecified: Secondary | ICD-10-CM | POA: Diagnosis not present

## 2022-04-14 DIAGNOSIS — K219 Gastro-esophageal reflux disease without esophagitis: Secondary | ICD-10-CM

## 2022-04-14 DIAGNOSIS — I088 Other rheumatic multiple valve diseases: Secondary | ICD-10-CM

## 2022-04-14 DIAGNOSIS — E7849 Other hyperlipidemia: Secondary | ICD-10-CM | POA: Diagnosis not present

## 2022-04-14 DIAGNOSIS — F411 Generalized anxiety disorder: Secondary | ICD-10-CM

## 2022-04-14 DIAGNOSIS — M810 Age-related osteoporosis without current pathological fracture: Secondary | ICD-10-CM

## 2022-04-14 NOTE — Telephone Encounter (Signed)
Pt made aware of dettinger's recommendations.  Referral placed to see pharmacist today.

## 2022-04-14 NOTE — Telephone Encounter (Signed)
If she cannot find prescription assistance then the other option is we will have to have her come in and transition her to Coumadin.  She would need a visit to do that though, it would be better if she could stay because it is less times having to come in for blood draws but I understand expense.  She can also be set up with [facility] just to see if there is an option for assistance

## 2022-04-14 NOTE — Chronic Care Management (AMB) (Signed)
  Chronic Care Management   Note  04/14/2022 Name: [name] MRN: [id number] DOB: 05/24/1941  [name] is a 81 y.o. year old female who is a primary care patient of [facility], Fransisca Kaufmann, MD. I reached out to [name] by phone today in response to a referral sent by [name]'s PCP.  [name] was given information about Chronic Care Management services today including:  CCM service includes personalized support from designated clinical staff supervised by her physician, including individualized plan of care and coordination with other care providers 24/7 contact phone numbers for assistance for urgent and routine care needs. Service will only be billed when office clinical staff spend 20 minutes or more in a month to coordinate care. Only one practitioner may furnish and bill the service in a calendar month. The patient may stop CCM services at any time (effective at the end of the month) by phone call to the office staff. The patient is responsible for co-pay (up to 20% after annual deductible is met) if co-pay is required by the individual health plan.   Patient agreed to services and verbal consent obtained.   Follow up plan: Face to Face appointment with care management team member scheduled for: 05/12/2022  Noreene Larsson, [place], [place], [place] [address] Direct Dial: [phone] [name]

## 2022-04-15 NOTE — Telephone Encounter (Signed)
Left message for pt to return call.

## 2022-04-15 NOTE — Telephone Encounter (Signed)
There is no assistance for eliquis (until patient has spent 3% of household income on prescription medications--most have not or are unable)  We can do Xarelto for $85/month flat through mail order, but patient must have debit card/credit card to set that up  If these options aren't feasible, I would recommend PCP appt to start warfarin

## 2022-04-15 NOTE — Telephone Encounter (Signed)
Okay let the patient know what [facility] said that we could do the Xarelto for $85 a month but that would be $85 a month every month or make an appointment to switch to Coumadin

## 2022-04-21 NOTE — Telephone Encounter (Signed)
Patient has appointment with pharmacist on 05/12/22.

## 2022-04-26 ENCOUNTER — Encounter: Payer: Self-pay | Admitting: Family Medicine

## 2022-04-26 ENCOUNTER — Telehealth: Payer: Self-pay | Admitting: Family Medicine

## 2022-04-26 ENCOUNTER — Ambulatory Visit (INDEPENDENT_AMBULATORY_CARE_PROVIDER_SITE_OTHER): Payer: Medicare HMO

## 2022-04-26 ENCOUNTER — Ambulatory Visit (INDEPENDENT_AMBULATORY_CARE_PROVIDER_SITE_OTHER): Payer: Medicare HMO | Admitting: Family Medicine

## 2022-04-26 VITALS — BP 120/83 | HR 67 | Temp 97.6°F | Ht 61.0 in | Wt 103.0 lb

## 2022-04-26 DIAGNOSIS — W19XXXA Unspecified fall, initial encounter: Secondary | ICD-10-CM

## 2022-04-26 DIAGNOSIS — M25512 Pain in left shoulder: Secondary | ICD-10-CM

## 2022-04-26 DIAGNOSIS — S42392A Other fracture of shaft of left humerus, initial encounter for closed fracture: Secondary | ICD-10-CM

## 2022-04-26 DIAGNOSIS — S42212A Unspecified displaced fracture of surgical neck of left humerus, initial encounter for closed fracture: Secondary | ICD-10-CM | POA: Diagnosis not present

## 2022-04-26 NOTE — Telephone Encounter (Signed)
Husband calling back. Please return call

## 2022-04-26 NOTE — Telephone Encounter (Signed)
Attempted to contact - NA 

## 2022-04-26 NOTE — Progress Notes (Unsigned)
BP 120/83   Pulse 67   Temp 97.6 F (36.4 C)   Ht '5\' 1"'$  (1.549 m)   Wt 103 lb (46.7 kg)   SpO2 97%   BMI 19.46 kg/m    Subjective:   Patient ID: [name], female    DOB: 09-Sep-1941, 81 y.o.   MRN: [id number]  HPI: [name] is a 81 y.o. female presenting on 04/26/2022 for Fall, Shoulder Pain (left), and Knee Pain (Bilateral/)   HPI Left shoulder pain Patient had a fall 2 days ago where [name] does not really know but [name] slipped and tripped and fell and landed on her left side including her shoulder and then fell forward onto her knees.  [name] did have some pain in her knees but that is improving and almost gone and so this has little pain in the left knee but most of the pain is in shoulder and [name] cannot raise it at all without hurting.  [name] has been taking Tylenol which helps a little and heating pad helps a little but Bengay is not working at all.  Relevant past medical, surgical, family and social history reviewed and updated as indicated. Interim medical history since our last visit reviewed. Allergies and medications reviewed and updated.  Review of Systems  Constitutional:  Negative for chills and fever.  Eyes:  Negative for visual disturbance.  Respiratory:  Negative for chest tightness and shortness of breath.   Cardiovascular:  Negative for chest pain and leg swelling.  Musculoskeletal:  Positive for arthralgias. Negative for back pain, gait problem and myalgias.  Skin:  Negative for color change and rash.  Neurological:  Negative for light-headedness and headaches.  Psychiatric/Behavioral:  Negative for agitation and behavioral problems.   All other systems reviewed and are negative.   Per HPI unless specifically indicated above   Allergies as of 04/26/2022       Reactions   Bisphosphonates Other (See Comments)   Difficulty swallowing   Ciprofloxacin Nausea And Vomiting   Codeine Nausea And Vomiting   Sulfa Antibiotics Nausea And Vomiting         Medication List        Accurate as of April 26, 2022  3:52 PM. If you have any questions, ask your nurse or doctor.          acetaminophen 500 MG tablet Commonly known as: TYLENOL Take 500 mg by mouth every 8 (eight) hours as needed for mild pain or moderate pain.   albuterol 108 (90 Base) MCG/ACT inhaler Commonly known as: VENTOLIN HFA Inhale 2 puffs into the lungs every 6 (six) hours as needed for wheezing or shortness of breath.   apixaban 2.5 MG Tabs tablet Commonly known as: ELIQUIS Take 1 tablet (2.5 mg total) by mouth 2 (two) times daily.   atorvastatin 80 MG tablet Commonly known as: LIPITOR TAKE 1 TABLET EVERY DAY   Commode Bedside Misc Bedside commode due to tibial fracture   Wheelchair Misc Pt needs wheelchair due to extremity fractures   Misc. Devices Misc Use rolling walker to ambulate around the house as needed.   dextromethorphan-guaiFENesin 30-600 MG 12hr tablet Commonly known as: MUCINEX DM Take 1 tablet by mouth 2 (two) times daily.   furosemide 20 MG tablet Commonly known as: LASIX Take 1 tablet (20 mg total) by mouth daily.   levothyroxine 50 MCG tablet Commonly known as: SYNTHROID Take 1 tablet (50 mcg total) by mouth daily.   loperamide 2 MG capsule Commonly  known as: IMODIUM Take 1 capsule (2 mg total) by mouth 4 (four) times daily as needed for diarrhea or loose stools.   metoprolol tartrate 25 MG tablet Commonly known as: LOPRESSOR Take 1 tablet (25 mg total) by mouth 2 (two) times daily as needed. What changed: reasons to take this   mirtazapine 15 MG disintegrating tablet Commonly known as: REMERON SOL-TAB Take 1 tablet (15 mg total) by mouth at bedtime.   mometasone 50 MCG/ACT nasal spray Commonly known as: Nasonex Place 2 sprays into the nose daily.   multivitamin with minerals Tabs tablet Take 2 tablets by mouth daily.   omeprazole 40 MG capsule Commonly known as: PRILOSEC Take 1 capsule (40 mg total) by mouth  daily.   ondansetron 4 MG disintegrating tablet Commonly known as: Zofran ODT Take 1 tablet (4 mg total) by mouth every 8 (eight) hours as needed for nausea or vomiting.   oxazepam 15 MG capsule Commonly known as: SERAX Take 1 capsule (15 mg total) by mouth 3 (three) times daily as needed for sleep. Cancel any other prescriptions on file   polyethylene glycol 17 g packet Commonly known as: MIRALAX / GLYCOLAX Take 17 g by mouth daily as needed for moderate constipation.   potassium chloride 20 MEQ/15ML (10%) Soln TAKE 7.5ML (10MEQ) BY MOUTH DAILY AS NEEDED FOR CRAMPING What changed: See the new instructions.   Shingrix injection Generic drug: Zoster Vaccine Adjuvanted   sucralfate 1 g tablet Commonly known as: CARAFATE Take 1 tablet (1 g total) by mouth 2 (two) times daily.   triamcinolone ointment 0.5 % Commonly known as: KENALOG APPLY TO THE AFFECTED AREA(S) TOPICALLY TWICE DAILY   Vitamin D3 50 MCG (2000 UT) Chew Chew 4,000 Units by mouth daily.         Objective:   BP 120/83   Pulse 67   Temp 97.6 F (36.4 C)   Ht '5\' 1"'$  (1.549 m)   Wt 103 lb (46.7 kg)   SpO2 97%   BMI 19.46 kg/m   Wt Readings from Last 3 Encounters:  04/26/22 103 lb (46.7 kg)  04/08/22 103 lb (46.7 kg)  03/26/22 107 lb (48.5 kg)    Physical Exam Vitals and nursing note reviewed.  Constitutional:      General: [name] is not in acute distress.    Appearance: [name] is well-developed. [name] is not diaphoretic.  Eyes:     Conjunctiva/sclera: Conjunctivae normal.  Musculoskeletal:     Left shoulder: Tenderness present. Decreased range of motion.       Arms:  Skin:    General: Skin is warm and dry.     Findings: No rash.  Neurological:     Mental Status: [name] is alert and oriented to person, place, and time.     Coordination: Coordination normal.  Psychiatric:        Behavior: Behavior normal.      X-ray read as potential left humeral fracture radiologist. Assessment & Plan:    Problem List Items Addressed This Visit   None Visit Diagnoses     Acute pain of left shoulder    -  Primary   Relevant Orders   DG Shoulder Left (Completed)   Ambulatory referral to Orthopedic Surgery   Fall, initial encounter       Relevant Orders   DG Shoulder Left (Completed)   Ambulatory referral to Orthopedic Surgery   Other closed fracture of shaft of left humerus, initial encounter       Relevant  Orders   Ambulatory referral to Orthopedic Surgery       We will place in sling for 2 weeks and then follow-up with orthopedic and [name] can start moving it more after that. Follow up plan: Return if symptoms worsen or fail to improve.  Counseling provided for all of the vaccine components Orders Placed This Encounter  Procedures   DG Shoulder Left   Ambulatory referral to [place] Trayshawn Durkin, MD [facility] 04/26/2022, 3:52 PM

## 2022-04-26 NOTE — Telephone Encounter (Signed)
Spoke with pt. [facility] does not have any openings today since being on vacation last week. Pt is willing to see [facility]. Appt scheduled at 2:35pm today.  Pt states that her shoulder is painful and both knees hurt.

## 2022-04-26 NOTE — Progress Notes (Unsigned)
BP 120/83   Pulse 67   Temp 97.6 F (36.4 C)   Ht '5\' 1"'$  (1.549 m)   Wt 103 lb (46.7 kg)   SpO2 97%   BMI 19.46 kg/m    Subjective:   Patient ID: [name], female    DOB: 08-29-1941, 81 y.o.   MRN: [id number]  HPI: [name] is a 81 y.o. female presenting on 04/26/2022 for Fall, Shoulder Pain (left), and Knee Pain (Bilateral/)   HPI Patient fell in her living room Saturday evening after getting out of her lift chair. She states that her right leg felt like it was asleep, causing her to fall and hit her left shoulder and both knees on the floor. She states her bilateral knee pain is tolerable and improving, but her left shoulder pain is unbearable with movement. She states a heating pad and tylenol helps with the pain but BenGay does not work. She denies bruising, joint swelling, palpitations, chest pain, and lightheadedness.  Relevant past medical, surgical, family and social history reviewed and updated as indicated. Interim medical history since our last visit reviewed. Allergies and medications reviewed and updated.  Review of Systems  Respiratory:  Negative for chest tightness and shortness of breath.   Cardiovascular:  Negative for chest pain and palpitations.  Musculoskeletal:  Positive for arthralgias. Negative for joint swelling and myalgias.  Neurological:  Negative for dizziness, syncope and light-headedness.    Per HPI unless specifically indicated above   Allergies as of 04/26/2022       Reactions   Bisphosphonates Other (See Comments)   Difficulty swallowing   Ciprofloxacin Nausea And Vomiting   Codeine Nausea And Vomiting   Sulfa Antibiotics Nausea And Vomiting        Medication List        Accurate as of April 26, 2022  4:14 PM. If you have any questions, ask your nurse or doctor.          acetaminophen 500 MG tablet Commonly known as: TYLENOL Take 500 mg by mouth every 8 (eight) hours as needed for mild pain or moderate pain.    albuterol 108 (90 Base) MCG/ACT inhaler Commonly known as: VENTOLIN HFA Inhale 2 puffs into the lungs every 6 (six) hours as needed for wheezing or shortness of breath.   apixaban 2.5 MG Tabs tablet Commonly known as: ELIQUIS Take 1 tablet (2.5 mg total) by mouth 2 (two) times daily.   atorvastatin 80 MG tablet Commonly known as: LIPITOR TAKE 1 TABLET EVERY DAY   Commode Bedside Misc Bedside commode due to tibial fracture   Wheelchair Misc Pt needs wheelchair due to extremity fractures   Misc. Devices Misc Use rolling walker to ambulate around the house as needed.   dextromethorphan-guaiFENesin 30-600 MG 12hr tablet Commonly known as: MUCINEX DM Take 1 tablet by mouth 2 (two) times daily.   furosemide 20 MG tablet Commonly known as: LASIX Take 1 tablet (20 mg total) by mouth daily.   levothyroxine 50 MCG tablet Commonly known as: SYNTHROID Take 1 tablet (50 mcg total) by mouth daily.   loperamide 2 MG capsule Commonly known as: IMODIUM Take 1 capsule (2 mg total) by mouth 4 (four) times daily as needed for diarrhea or loose stools.   metoprolol tartrate 25 MG tablet Commonly known as: LOPRESSOR Take 1 tablet (25 mg total) by mouth 2 (two) times daily as needed. What changed: reasons to take this   mirtazapine 15 MG disintegrating tablet Commonly known  as: REMERON SOL-TAB Take 1 tablet (15 mg total) by mouth at bedtime.   mometasone 50 MCG/ACT nasal spray Commonly known as: Nasonex Place 2 sprays into the nose daily.   multivitamin with minerals Tabs tablet Take 2 tablets by mouth daily.   omeprazole 40 MG capsule Commonly known as: PRILOSEC Take 1 capsule (40 mg total) by mouth daily.   ondansetron 4 MG disintegrating tablet Commonly known as: Zofran ODT Take 1 tablet (4 mg total) by mouth every 8 (eight) hours as needed for nausea or vomiting.   oxazepam 15 MG capsule Commonly known as: SERAX Take 1 capsule (15 mg total) by mouth 3 (three) times daily  as needed for sleep. Cancel any other prescriptions on file   polyethylene glycol 17 g packet Commonly known as: MIRALAX / GLYCOLAX Take 17 g by mouth daily as needed for moderate constipation.   potassium chloride 20 MEQ/15ML (10%) Soln TAKE 7.5ML (10MEQ) BY MOUTH DAILY AS NEEDED FOR CRAMPING What changed: See the new instructions.   Shingrix injection Generic drug: Zoster Vaccine Adjuvanted   sucralfate 1 g tablet Commonly known as: CARAFATE Take 1 tablet (1 g total) by mouth 2 (two) times daily.   triamcinolone ointment 0.5 % Commonly known as: KENALOG APPLY TO THE AFFECTED AREA(S) TOPICALLY TWICE DAILY   Vitamin D3 50 MCG (2000 UT) Chew Chew 4,000 Units by mouth daily.         Objective:   BP 120/83   Pulse 67   Temp 97.6 F (36.4 C)   Ht '5\' 1"'$  (1.549 m)   Wt 103 lb (46.7 kg)   SpO2 97%   BMI 19.46 kg/m   Wt Readings from Last 3 Encounters:  04/26/22 103 lb (46.7 kg)  04/08/22 103 lb (46.7 kg)  03/26/22 107 lb (48.5 kg)    Physical Exam Constitutional:      Appearance: Normal appearance. She is normal weight.  Cardiovascular:     Rate and Rhythm: Normal rate.  Pulmonary:     Effort: Pulmonary effort is normal.  Musculoskeletal:     Left shoulder: Tenderness present. No swelling, deformity or laceration. Decreased range of motion.       Arms:  Skin:    Findings: No bruising or lesion.  Neurological:     Mental Status: She is alert.    X-Ray: Potential humeral neck fracture with impaction. Degree of osteopenia limiting assessment.  Assessment & Plan:   Problem List Items Addressed This Visit   None Visit Diagnoses     Acute pain of left shoulder    -  Primary   Relevant Orders   DG Shoulder Left (Completed)   Ambulatory referral to Orthopedic Surgery   Fall, initial encounter       Relevant Orders   DG Shoulder Left (Completed)   Ambulatory referral to Orthopedic Surgery   Other closed fracture of shaft of left humerus, initial  encounter       Relevant Orders   Ambulatory referral to Orthopedic Surgery     Due to potential humeral neck fracture, patient's left arm was immobilized with a sling. Patient was advised to use sling for 2 weeks and follow up with orthopedics. Patient was also advised to use tylenol as needed for pain.  Follow up plan: Follow up with orthopedics in 2 weeks.  Orders Placed This Encounter  Procedures   DG Shoulder Left   Ambulatory referral to Orthopedic Surgery    Arlan Organ, Medical Student 04/26/2022, 4:14 PM

## 2022-04-26 NOTE — Telephone Encounter (Signed)
Pts husband called requesting to speak with nurse Caryl Pina; Dr Neldon Mc nurse.  Says pt fell over the weekend and might need xrays.  Offered to make an appt but he declined. Wants to speak to [place] about it.

## 2022-04-27 ENCOUNTER — Other Ambulatory Visit: Payer: Self-pay | Admitting: Family Medicine

## 2022-04-27 DIAGNOSIS — F411 Generalized anxiety disorder: Secondary | ICD-10-CM

## 2022-04-28 ENCOUNTER — Ambulatory Visit: Payer: Medicare HMO

## 2022-04-28 NOTE — Telephone Encounter (Signed)
Serax last refilled 03/26/22, #90, 3 refills  Last TSH was 5 months ago and it was abnormal.

## 2022-04-28 NOTE — Telephone Encounter (Signed)
The pharmacy should have prescriptions for the oxazepam because it was just sent with 3 refills last month so I do not know why they are requesting it for months.  Will send levothyroxine

## 2022-05-03 ENCOUNTER — Ambulatory Visit: Payer: Medicare HMO | Admitting: Family Medicine

## 2022-05-04 ENCOUNTER — Ambulatory Visit: Payer: Medicare HMO | Admitting: Orthopaedic Surgery

## 2022-05-04 ENCOUNTER — Encounter: Payer: Self-pay | Admitting: Orthopaedic Surgery

## 2022-05-04 VITALS — BP 115/71 | HR 80 | Ht 61.0 in | Wt 102.0 lb

## 2022-05-04 DIAGNOSIS — S42202A Unspecified fracture of upper end of left humerus, initial encounter for closed fracture: Secondary | ICD-10-CM

## 2022-05-04 DIAGNOSIS — Z7901 Long term (current) use of anticoagulants: Secondary | ICD-10-CM

## 2022-05-04 NOTE — Progress Notes (Signed)
I fell  She fell at home on 04-24-22.  She hurt her left shoulder.  She went to [place] on 04-26-22 because shoulder was still hurting.  X-rays were done and showed: IMPRESSION: Potential humeral neck fracture with impaction. Degree of osteopenia limiting assessment.   She was given sling.  She is feeling better. She takes Tylenol for pain. She has no other injury.  She is on Eliquis.  NV intact.  ROM left shoulder very painful and limited.  She has no ecchymosis.  I have independently reviewed and interpreted x-rays of this patient done at another site by another physician or qualified health professional.   Encounter Diagnoses  Name Primary?   Closed traumatic nondisplaced fracture of proximal end of left humerus, initial encounter Yes   Anticoagulated    I will have her continue the sling.  Return in two weeks.  X-rays then.    Call if any problem.  Precautions discussed.  Electronically Signed Sanjuana Kava, MD 7/18/202311:26 AM

## 2022-05-12 ENCOUNTER — Ambulatory Visit (INDEPENDENT_AMBULATORY_CARE_PROVIDER_SITE_OTHER): Payer: Medicare HMO | Admitting: Pharmacist

## 2022-05-12 DIAGNOSIS — R630 Anorexia: Secondary | ICD-10-CM

## 2022-05-12 DIAGNOSIS — R634 Abnormal weight loss: Secondary | ICD-10-CM

## 2022-05-12 DIAGNOSIS — I10 Essential (primary) hypertension: Secondary | ICD-10-CM

## 2022-05-12 DIAGNOSIS — E782 Mixed hyperlipidemia: Secondary | ICD-10-CM

## 2022-05-12 DIAGNOSIS — F411 Generalized anxiety disorder: Secondary | ICD-10-CM

## 2022-05-12 DIAGNOSIS — K219 Gastro-esophageal reflux disease without esophagitis: Secondary | ICD-10-CM

## 2022-05-12 MED ORDER — SUCRALFATE 1 G PO TABS: 1.0000 g | ORAL_TABLET | Freq: Two times a day (BID) | ORAL | 3 refills | Status: DC

## 2022-05-12 MED ORDER — ATORVASTATIN CALCIUM 80 MG PO TABS: 80.0000 mg | ORAL_TABLET | Freq: Every day | ORAL | 3 refills | Status: DC

## 2022-05-12 MED ORDER — MIRTAZAPINE 15 MG PO TBDP: 15.0000 mg | ORAL_TABLET | Freq: Every day | ORAL | 3 refills | Status: DC

## 2022-05-12 MED ORDER — TRIAMCINOLONE ACETONIDE 0.5 % EX OINT: TOPICAL_OINTMENT | CUTANEOUS | 2 refills | Status: AC

## 2022-05-12 MED ORDER — FUROSEMIDE 20 MG PO TABS: 20.0000 mg | ORAL_TABLET | Freq: Every day | ORAL | 3 refills | Status: DC

## 2022-05-12 NOTE — Progress Notes (Signed)
Chronic Care Management Pharmacy Note  05/12/2022 Name:  [name] MRN:  [id number] DOB:  Aug 05, 1941  Summary:  Atrial Fibrillation: Uncontrolled/controlled; current rate/rhythm control; converted to sinus on metoprolol; anticoagulant treatment: eliquis 2.mg BID A fib with RVR (NEW ONSET) -Follows with cardiology -Her CHA2DS2-VASc score> 2, started on Eliquis 2.5 mg p.o. twice daily(given age and weight). Home blood pressure, heart rate readings: n/a, encouraged Assessed patient finances. Application for LIS/extra help submitted via [place] website; will f/u up   Patient Goals/Self-Care Activities patient will:  - take medications as prescribed as evidenced by patient report and record review collaborate with provider on medication access solutions   Subjective: [name] is an 81 y.o. year old female who is a primary patient of Dettinger, Fransisca Kaufmann, MD.  The CCM team was consulted for assistance with disease management and care coordination needs.    Engaged with patient face to face for initial visit in response to provider referral for pharmacy case management and/or care coordination services.   Consent to Services:  The patient was given information about Chronic Care Management services, agreed to services, and gave verbal consent prior to initiation of services.  Please see initial visit note for detailed documentation.   Patient Care Team: Dettinger, Fransisca Kaufmann, MD as PCP - General (Family Medicine) Minus Breeding, MD (Cardiology) Clarene Essex, MD (Gastroenterology) Skeet Latch, MD as Attending Physician (Cardiology) Sandford Craze, MD as Referring Physician (Dermatology) Lavera Guise, [facility] as Pharmacist (Family Medicine)  Objective:  Lab Results  Component Value Date   CREATININE 1.09 (H) 04/08/2022   CREATININE 0.95 03/27/2022   CREATININE 0.99 03/26/2022    Lab Results  Component Value Date   HGBA1C 5.6 11/27/2021   Last diabetic Eye  exam: No results found for: "HMDIABEYEEXA"  Last diabetic Foot exam: No results found for: "HMDIABFOOTEX"      Component Value Date/Time   CHOL 145 11/27/2021 1135   CHOL 122 07/23/2013 0858   TRIG 134 11/27/2021 1135   TRIG 116 08/06/2015 1221   TRIG 89 07/23/2013 0858   HDL 42 11/27/2021 1135   HDL 56 08/06/2015 1221   HDL 47 07/23/2013 0858   CHOLHDL 3.5 11/27/2021 1135   LDLCALC 79 11/27/2021 1135   LDLCALC 63 07/17/2014 0929   LDLCALC 57 07/23/2013 0858       Latest Ref Rng & Units 04/08/2022   10:12 AM 03/26/2022    2:13 PM 11/27/2021   11:35 AM  Hepatic Function  Total Protein 6.0 - 8.5 g/dL 6.7  7.1  6.9   Albumin 3.6 - 4.6 g/dL 3.7  3.1  4.0   AST 0 - 40 IU/L _0 ALT 0 - 32 IU/L _1 Alk Phosphatase 44 - 121 IU/L 104  92  102   Total Bilirubin 0.0 - 1.2 mg/dL 0.6  0.7  0.5     Lab Results  Component Value Date/Time   TSH <0.010 (L) 03/26/2022 02:13 PM   TSH <0.005 (L) 11/27/2021 11:35 AM   TSH 0.197 (L) 09/29/2021 11:16 AM   FREET4 2.23 (H) 03/26/2022 02:13 PM       Latest Ref Rng & Units 04/08/2022   10:12 AM 03/27/2022    5:25 AM 03/26/2022    2:13 PM  CBC  WBC 3.4 - 10.8 x10E3/uL 6.5  5.0  9.2   Hemoglobin 11.1 - 15.9 g/dL 12.3  11.2  12.3  Hematocrit 34.0 - 46.6 % 38.4  35.6  38.6   Platelets 150 - 450 x10E3/uL 284  280  327     Lab Results  Component Value Date/Time   VD25OH 30.5 05/02/2019 11:51 AM   VD25OH 25.6 (L) 11/11/2018 10:17 AM    Clinical ASCVD: No  The ASCVD Risk score (Arnett DK, et al., 2019) failed to calculate for the following reasons:   The 2019 ASCVD risk score is only valid for ages 68 to 31    Other: (CHADS2VASc if Afib, PHQ9 if depression, MMRC or CAT for COPD, ACT, DEXA)  Social History   Tobacco Use  Smoking Status Former   Types: Cigarettes   Quit date: 11/18/2005   Years since quitting: 16.5  Smokeless Tobacco Never  Tobacco Comments   Non-smoker  quit 12-13 years   BP Readings from Last 3  Encounters:  05/04/22 115/71  04/26/22 120/83  04/08/22 110/64   Pulse Readings from Last 3 Encounters:  05/04/22 80  04/26/22 67  04/08/22 74   Wt Readings from Last 3 Encounters:  05/04/22 102 lb (46.3 kg)  04/26/22 103 lb (46.7 kg)  04/08/22 103 lb (46.7 kg)    Assessment: Review of patient past medical history, allergies, medications, health status, including review of consultants reports, laboratory and other test data, was performed as part of comprehensive evaluation and provision of chronic care management services.   SDOH:  (Social Determinants of Health) assessments and interventions performed:    CCM Care Plan  Allergies  Allergen Reactions   Bisphosphonates Other (See Comments)    Difficulty swallowing    Ciprofloxacin Nausea And Vomiting   Codeine Nausea And Vomiting   Sulfa Antibiotics Nausea And Vomiting    Medications Reviewed Today     Reviewed by Lavera Guise, [facility] (Pharmacist) on 05/19/22 at 4  Med List Status: <None>   Medication Order Taking? Sig Documenting Provider Last Dose Status Informant  acetaminophen (TYLENOL) 500 MG tablet [id number] No Take 500 mg by mouth every 8 (eight) hours as needed for mild pain or moderate pain. [provider] Taking Active Self, Pharmacy Records, Spouse/Significant Other  albuterol (VENTOLIN HFA) 108 (90 Base) MCG/ACT inhaler [id number] No Inhale 2 puffs into the lungs every 6 (six) hours as needed for wheezing or shortness of breath. Dettinger, Fransisca Kaufmann, MD Taking Active Self, Pharmacy Records, Spouse/Significant Other  apixaban (ELIQUIS) 2.5 MG TABS tablet [id number] No Take 1 tablet (2.5 mg total) by mouth 2 (two) times daily. Dettinger, Fransisca Kaufmann, MD Taking Active   atorvastatin (LIPITOR) 80 MG tablet [id number] No Take 1 tablet (80 mg total) by mouth daily. PROFILE-PATIENT HAS CURRENT SUPPLY Dettinger, Fransisca Kaufmann, MD Taking Active   Cholecalciferol (VITAMIN D3) 2000 units CHEW [id number] No Chew 4,000 Units  by mouth daily. [provider] Taking Active Self, Pharmacy Records, Spouse/Significant Other  dextromethorphan-guaiFENesin [facility] DM) 30-600 MG 12hr tablet [id number] No Take 1 tablet by mouth 2 (two) times daily. Kathie Dike, MD Taking Active   furosemide (LASIX) 20 MG tablet [id number] No Take 1 tablet (20 mg total) by mouth daily. Dettinger, Fransisca Kaufmann, MD Taking Active   hydrochlorothiazide (HYDRODIURIL) 25 MG tablet [id number] No Take 25 mg by mouth daily. [provider] Taking Active   levothyroxine (SYNTHROID) 50 MCG tablet [id number] No Take 50 mcg by mouth daily before breakfast. [provider] Taking Active   loperamide (IMODIUM) 2 MG capsule [id number] No Take 1 capsule (2 mg total) by mouth 4 (four) times  daily as needed for diarrhea or loose stools. Kathie Dike, MD Taking Active   metoprolol tartrate (LOPRESSOR) 25 MG tablet [id number] No Take 1 tablet (25 mg total) by mouth 2 (two) times daily as needed.  Patient taking differently: Take 25 mg by mouth 2 (two) times daily as needed (irregular heart rate).   Dettinger, Fransisca Kaufmann, MD Taking Active Self, Pharmacy Records, Spouse/Significant Other  mirtazapine (REMERON SOL-TAB) 15 MG disintegrating tablet [id number] No Take 1 tablet (15 mg total) by mouth at bedtime. Dettinger, Fransisca Kaufmann, MD Taking Active   Misc. Devices ([place]) MISC [id number] No Bedside commode due to tibial fracture Triplett, Tammy, PA-C Taking Active Self, Pharmacy Records, Spouse/Significant Other  Misc. Devices [facility]) [place] [id number] No Pt needs wheelchair due to extremity fractures Triplett, Tammy, PA-C Taking Active Self, Pharmacy Records, Spouse/Significant Other  Misc. Devices MISC [id number] No Use rolling walker to ambulate around the house as needed. Mordecai Rasmussen, MD Taking Active Self, Pharmacy Records, Spouse/Significant Other  mometasone (NASONEX) 50 MCG/ACT nasal spray [id number] No Place 2 sprays into the nose  daily. Ivy Lynn, NP Taking Active Self, Pharmacy Records, Spouse/Significant Other  Multiple Vitamin (MULTIVITAMIN WITH MINERALS) TABS tablet [id number] No Take 2 tablets by mouth daily.  [provider] Taking Active Self, Pharmacy Records, Spouse/Significant Other  omeprazole (PRILOSEC) 40 MG capsule [id number] No Take 1 capsule (40 mg total) by mouth daily. Dettinger, Fransisca Kaufmann, MD Taking Active Self, Pharmacy Records, Spouse/Significant Other  ondansetron (ZOFRAN ODT) 4 MG disintegrating tablet [id number] No Take 1 tablet (4 mg total) by mouth every 8 (eight) hours as needed for nausea or vomiting. Evalee Jefferson, PA-C Taking Active Self, Pharmacy Records, Spouse/Significant Other  oxazepam (SERAX) 15 MG capsule [id number] No Take 1 capsule (15 mg total) by mouth 3 (three) times daily as needed for sleep. Cancel any other prescriptions on file Dettinger, Fransisca Kaufmann, MD Taking Active Self, Pharmacy Records, Spouse/Significant Other  polyethylene glycol [facility] / GLYCOLAX) packet [id number] No Take 17 g by mouth daily as needed for moderate constipation.  [provider] Taking Active Self, Pharmacy Records, Spouse/Significant Other  potassium chloride 20 MEQ/15ML (10%) SOLN [id number] No TAKE 7.5ML (10MEQ) BY MOUTH DAILY AS NEEDED FOR CRAMPING  Patient taking differently: Take 10 mEq by mouth daily as needed (cramping).   Dettinger, Fransisca Kaufmann, MD Taking Active Self, Pharmacy Records, Spouse/Significant Other  [facility] injection [id number] No  [provider] Taking Active Self, Pharmacy Records, Spouse/Significant Other           Med Note Jimmye Norman, [place] S   Fri Mar 26, 2022  5:45 PM) Second Dose  sucralfate (CARAFATE) 1 g tablet [id number] No Take 1 tablet (1 g total) by mouth 2 (two) times daily. Dettinger, Fransisca Kaufmann, MD Taking Active   triamcinolone ointment (KENALOG) 0.5 % [id number] No APPLY TO THE AFFECTED AREA(S) TOPICALLY TWICE DAILY Dettinger, Fransisca Kaufmann, MD Taking Active              Patient Active Problem List   Diagnosis Date Noted   Atrial fibrillation with RVR ([place]) 03/26/2022   Tibial plateau fracture, right, closed, initial encounter 07/17/2020   Prediabetes 02/25/2020   Chronic kidney disease (CKD) stage G3b/A1, moderately decreased glomerular filtration rate (GFR) between 30-44 mL/min/1.73 square meter and albuminuria creatinine ratio less than 30 mg/g (HCC) 02/22/2018   Atherosclerosis of aorta ([place]) 02/21/2018   PAC (premature atrial contraction) 12/30/2015   Essential hypertension 12/18/2015   Lower extremity edema 12/18/2015   Palpitations  12/18/2015   Hyperlipemia 02/14/2013   Hypothyroid 02/14/2013   Vitamin D deficiency 02/14/2013   Generalized anxiety disorder 02/14/2013   Constipation    Esophageal stricture    Osteoporosis with pathological fracture    GERD (gastroesophageal reflux disease)     Immunization History  Administered Date(s) Administered   Fluad Quad(high Dose 65+) 07/31/2019, 08/06/2020, 07/27/2021   Influenza Whole 06/18/2010   Influenza, High Dose Seasonal PF 08/19/2016, 08/02/2017, 08/01/2018   Influenza,inj,Quad PF,6+ Mos 07/23/2013, 07/17/2014, 08/06/2015   Moderna Sars-Covid-2 Vaccination 01/02/2020, 01/30/2020   Pneumococcal Conjugate-13 11/05/2013   Pneumococcal Polysaccharide-23 10/18/2004   Td 08/18/2010   Tdap 08/12/2011, 03/26/2022   Zoster Recombinat (Shingrix) 11/27/2021, 03/26/2022   Zoster, Live 08/26/2010    Conditions to be addressed/monitored: Atrial Fibrillation and HTN  Care Plan : PHARMD MEDICATION MANAGEMENT  Updates made by Lavera Guise, [place] since 05/19/2022 12:00 AM     Problem: DISEASE PROGRESSION PREVENTION      Long-Range Goal: ATRIAL FIBRILLATION   Note:   Current Barriers:  Unable to independently afford treatment regimen  Pharmacist Clinical Goal(s):  patient will verbalize ability to afford treatment regimen through collaboration with PharmD and provider.     Interventions: 1:1 collaboration with Dettinger, Fransisca Kaufmann, MD regarding development and update of comprehensive plan of care as evidenced by provider attestation and co-signature Inter-disciplinary care team collaboration (see longitudinal plan of care) Comprehensive medication review performed; medication list updated in electronic medical record  Atrial Fibrillation: Uncontrolled/controlled; current rate/rhythm control; converted to sinus on metoprolol; anticoagulant treatment: eliquis 2.mg BID A fib with RVR (NEW ONSET) -Follows with cardiology -Her CHA2DS2-VASc score> 2, started on Eliquis 2.5 mg p.o. twice daily(given age and weight). Home blood pressure, heart rate readings: n/a, encouraged Assessed patient finances. Application for LIS/extra help submitted via [place] website; will f/u up   Patient Goals/Self-Care Activities patient will:  - take medications as prescribed as evidenced by patient report and record review collaborate with provider on medication access solutions      Medication Assistance:  application submitted for LIS/extra help through   Follow Up:  Patient agrees to Care Plan and Follow-up.  Plan: Telephone follow up appointment with care management team member scheduled for:  next week   Regina Eck, PharmD, BCPS Clinical Pharmacist, [place]  II Phone [phone]

## 2022-05-17 DIAGNOSIS — E782 Mixed hyperlipidemia: Secondary | ICD-10-CM

## 2022-05-17 DIAGNOSIS — I10 Essential (primary) hypertension: Secondary | ICD-10-CM | POA: Diagnosis not present

## 2022-05-18 ENCOUNTER — Encounter: Payer: Self-pay | Admitting: Orthopaedic Surgery

## 2022-05-18 ENCOUNTER — Ambulatory Visit (INDEPENDENT_AMBULATORY_CARE_PROVIDER_SITE_OTHER): Payer: Medicare HMO

## 2022-05-18 ENCOUNTER — Ambulatory Visit (INDEPENDENT_AMBULATORY_CARE_PROVIDER_SITE_OTHER): Payer: Medicare HMO | Admitting: Orthopaedic Surgery

## 2022-05-18 DIAGNOSIS — S42202A Unspecified fracture of upper end of left humerus, initial encounter for closed fracture: Secondary | ICD-10-CM | POA: Diagnosis not present

## 2022-05-18 DIAGNOSIS — S42202D Unspecified fracture of upper end of left humerus, subsequent encounter for fracture with routine healing: Secondary | ICD-10-CM

## 2022-05-18 NOTE — Progress Notes (Signed)
I feel better.  She is using her sling on the left.  She has no problem.  NV intact.  She has some tenderness of the shoulder on the left.    X-rays were done of the left shoulder, reported separately.  Encounter Diagnosis  Name Primary?   Closed traumatic nondisplaced fracture of proximal end of left humerus with routine healing, subsequent encounter Yes   Return in two weeks.  Consider home PT if doing well then.  Call if any problem.  Precautions discussed.  X-rays on return of the left shoulder.  Electronically Signed Sanjuana Kava, MD 8/1/202310:05 AM

## 2022-05-19 NOTE — Patient Instructions (Addendum)
Visit Information  Following are the goals we discussed today:  Current Barriers:  Unable to independently afford treatment regimen  Pharmacist Clinical Goal(s):  patient will verbalize ability to afford treatment regimen through collaboration with PharmD and provider.    Interventions: 1:1 collaboration with Dettinger, Fransisca Kaufmann, MD regarding development and update of comprehensive plan of care as evidenced by provider attestation and co-signature Inter-disciplinary care team collaboration (see longitudinal plan of care) Comprehensive medication review performed; medication list updated in electronic medical record  Atrial Fibrillation: Uncontrolled/controlled; current rate/rhythm control; converted to sinus on metoprolol; anticoagulant treatment: eliquis 2.mg BID A fib with RVR (NEW ONSET) -Follows with cardiology -Her CHA2DS2-VASc score> 2, started on Eliquis 2.5 mg p.o. twice daily(given age and weight). Home blood pressure, heart rate readings: n/a, encouraged Assessed patient finances. Application for LIS/extra help submitted via [place] website; will f/u up   Patient Goals/Self-Care Activities patient will:  - take medications as prescribed as evidenced by patient report and record review collaborate with provider on medication access solutions   Plan: Telephone follow up appointment with care management team member scheduled for:  4 weeks  Signature Regina Eck, PharmD, BCPS Clinical Pharmacist, [place]  II Phone [phone]   Please call the care guide team at [phone] if you need to cancel or reschedule your appointment.   The patient verbalized understanding of instructions, educational materials, and care plan provided today and DECLINED offer to receive copy of patient instructions, educational materials, and care plan.

## 2022-05-20 ENCOUNTER — Telehealth: Payer: Medicare HMO

## 2022-05-24 ENCOUNTER — Ambulatory Visit (HOSPITAL_BASED_OUTPATIENT_CLINIC_OR_DEPARTMENT_OTHER): Payer: Medicare HMO | Admitting: Cardiovascular Disease

## 2022-05-24 ENCOUNTER — Encounter (HOSPITAL_BASED_OUTPATIENT_CLINIC_OR_DEPARTMENT_OTHER): Payer: Self-pay | Admitting: Cardiovascular Disease

## 2022-05-24 VITALS — BP 126/72 | HR 97 | Ht 61.0 in | Wt 105.6 lb

## 2022-05-24 DIAGNOSIS — I5032 Chronic diastolic (congestive) heart failure: Secondary | ICD-10-CM | POA: Diagnosis not present

## 2022-05-24 DIAGNOSIS — I351 Nonrheumatic aortic (valve) insufficiency: Secondary | ICD-10-CM

## 2022-05-24 DIAGNOSIS — E782 Mixed hyperlipidemia: Secondary | ICD-10-CM | POA: Diagnosis not present

## 2022-05-24 DIAGNOSIS — E039 Hypothyroidism, unspecified: Secondary | ICD-10-CM | POA: Diagnosis not present

## 2022-05-24 DIAGNOSIS — I4891 Unspecified atrial fibrillation: Secondary | ICD-10-CM

## 2022-05-24 DIAGNOSIS — I10 Essential (primary) hypertension: Secondary | ICD-10-CM | POA: Diagnosis not present

## 2022-05-24 DIAGNOSIS — I050 Rheumatic mitral stenosis: Secondary | ICD-10-CM | POA: Insufficient documentation

## 2022-05-24 HISTORY — DX: Nonrheumatic aortic (valve) insufficiency: I35.1

## 2022-05-24 HISTORY — DX: Chronic diastolic (congestive) heart failure: I50.32

## 2022-05-24 HISTORY — DX: Rheumatic mitral stenosis: I05.0

## 2022-05-24 NOTE — Assessment & Plan Note (Signed)
Echo report revealed grade 3 diastolic dysfunction.  I am not sure how accurate this is in the setting of mitral stenosis.  Continue with diuresis as above.  She did not have any significant LVH or pericardial disease which would raise the concern for constriction or restriction.  Continue to monitor for now and repeat echo in 4 months as above.  No evidence of infiltrative cardiomyopathy.

## 2022-05-24 NOTE — Assessment & Plan Note (Signed)
Mean gradient 6 mmHg.  She has had a history of mitral stenosis for several years.  She had does have very mild functional lower extremity edema.  Continue furosemide.  Repeat echo in 4 months.

## 2022-05-24 NOTE — Assessment & Plan Note (Signed)
She had reported severe aortic regurgitation on her echo.  This was in the setting of A-fib with RVR.  And seem more moderate on my review.  Repeat her echo in 4 months as above.  Continue with blood pressure control and diuresis.

## 2022-05-24 NOTE — Assessment & Plan Note (Signed)
Noted on EKG today.  She is asymptomatic.  Continue metoprolol and check thyroid function as above.

## 2022-05-24 NOTE — Progress Notes (Signed)
Cardiology Office Note   Date:  05/24/2022   ID:  [name], DOB 1941-05-10, MRN [id number]  PCP:  Dettinger, Fransisca Kaufmann, MD  Cardiologist:   Skeet Latch, MD   No chief complaint on file.    History of Present Illness: [name] is a 81 y.o. female with rheumatic mitral stenosis, aortic regurgitation, COPD, hyperlipidemia, hypertension, PAF, aortic atherosclerosis, anxiety and prediabetes, who is being seen today for the evaluation of atrial fibrillation at the request of Dettinger, Fransisca Kaufmann, MD. she was hospitalized 03/2022 with new onset atrial fibrillation.  She was in the hospital 6/9 through 03/27/2022.  She was started on Eliquis and metoprolol.  She reported having low energy and her thyroid was noted to be unregulated.  Echo at that time revealed LVEF 60 to 65% with grade 3 just diastolic dysfunction.  She was noted again to have a rheumatic mitral valve with mild mitral stenosis and mild much regurgitation.  Mean mitral gradient was 6 mmHg.  She last followed up with Dr. Percival Spanish in [place] in 2019.  She reported some mild lower extremity edema but was otherwise well.  I previously saw her in clinic in 2017 with palpitations.  She was noted to have PACs on EKG.  She was started on metoprolol.  Echo in 2018 revealed LVEF 55 to 60% with grade 1 diastolic dysfunction.  She had mild to moderate mitral stenosis on echo consistent with rheumatic heart disease.  Echo 03/2022 revealed LVEF 60-65% with grade III diastolic dysfunction.  She had severe AR, mild mitral stenosis and moderate TR.  She was unaware that she was in atrial fibrillation.  She was struggling with diarrhea for 3-4 weeks and then was noted to be in atrial fibrillation.  She felt very tired and weak.   She got short of breath just walking around her home.  She had LE edema.  She fell since leaving the hospital.  She is unsure whether her leg gave out or if she passed out.  She fractured her arm and is in a sling.   She has no chest pain and her breathing has been stable. She has been working with physical therapy.  Overall she has felt well since discharge.  She does still have some lower extremity edema at times.  She tries to elevate her legs.  She denies chest pain and her breathing has been stable.  She has not had any palpitations.  She does not take her Lasix on the days when she needs to go on errands.  She will take when she gets home.  She notes that her hydrochlorothiazide was discontinued by her PCP.  Past Medical History:  Diagnosis Date   Anxiety states    Aortic regurgitation 05/24/2022   Atrial fibrillation (HCC)    Breast cyst    Cancer (HCC)    skin   Cataract    Chronic diastolic heart failure ([place]) 05/24/2022   Constipation    Esophageal stricture    Essential hypertension 12/18/2015   Fracture, rib    GERD (gastroesophageal reflux disease)    Hiatal hernia    Lower extremity edema 12/18/2015   Mitral stenosis 05/24/2022   Osteoporosis    Other and unspecified hyperlipidemia    PAC (premature atrial contraction) 12/30/2015   Palpitations 12/18/2015   Unspecified hypothyroidism    Vertebral fracture, osteoporotic ([place])     Past Surgical History:  Procedure Laterality Date   ABDOMINAL HYSTERECTOMY     APPENDECTOMY  BREAST BIOPSY     bil cysts   CATARACT EXTRACTION     ESOPHAGOGASTRODUODENOSCOPY N/A 01/19/2018   Procedure: ESOPHAGOGASTRODUODENOSCOPY (EGD);  Surgeon: Clarene Essex, MD;  Location: [facility];  Service: Endoscopy;  Laterality: N/A;  with flouro   EYE SURGERY     HAND SURGERY Left    IR GENERIC HISTORICAL  12/10/2016   IR RADIOLOGIST EVAL & MGMT 12/10/2016 [facility]   IR GENERIC HISTORICAL  12/23/2016   IR KYPHO THORACIC WITH BONE BIOPSY 12/23/2016 Luanne Bras, MD [facility]   PARTIAL HYSTERECTOMY     THYROID LOBECTOMY     right   VERTEBROPLASTY       Current Outpatient Medications  Medication Sig Dispense Refill   acetaminophen (TYLENOL) 500 MG  tablet Take 500 mg by mouth every 8 (eight) hours as needed for mild pain or moderate pain.     albuterol (VENTOLIN HFA) 108 (90 Base) MCG/ACT inhaler Inhale 2 puffs into the lungs every 6 (six) hours as needed for wheezing or shortness of breath. 8 g 0   apixaban (ELIQUIS) 2.5 MG TABS tablet Take 1 tablet (2.5 mg total) by mouth 2 (two) times daily. 180 tablet 3   atorvastatin (LIPITOR) 80 MG tablet Take 1 tablet (80 mg total) by mouth daily. PROFILE-PATIENT HAS CURRENT SUPPLY 90 tablet 3   Cholecalciferol (VITAMIN D3) 2000 units CHEW Chew 4,000 Units by mouth daily.     furosemide (LASIX) 20 MG tablet Take 1 tablet (20 mg total) by mouth daily. 90 tablet 3   levothyroxine (SYNTHROID) 50 MCG tablet Take 50 mcg by mouth daily before breakfast.     loperamide (IMODIUM) 2 MG capsule Take 1 capsule (2 mg total) by mouth 4 (four) times daily as needed for diarrhea or loose stools. 30 capsule 0   metoprolol tartrate (LOPRESSOR) 25 MG tablet Take 1 tablet (25 mg total) by mouth 2 (two) times daily as needed. (Patient taking differently: Take 25 mg by mouth 2 (two) times daily as needed (irregular heart rate).) 180 tablet 3   mirtazapine (REMERON SOL-TAB) 15 MG disintegrating tablet Take 1 tablet (15 mg total) by mouth at bedtime. 90 tablet 3   Misc. Devices (COMMODE BEDSIDE) MISC Bedside commode due to tibial fracture 1 each 0   Misc. Devices [facility]) MISC Pt needs wheelchair due to extremity fractures 1 each 0   Misc. Devices MISC Use rolling walker to ambulate around the house as needed. 1 each 0   mometasone (NASONEX) 50 MCG/ACT nasal spray Place 2 sprays into the nose daily. 1 each 12   Multiple Vitamin (MULTIVITAMIN WITH MINERALS) TABS tablet Take 2 tablets by mouth daily.      omeprazole (PRILOSEC) 40 MG capsule Take 1 capsule (40 mg total) by mouth daily. 90 capsule 3   ondansetron (ZOFRAN ODT) 4 MG disintegrating tablet Take 1 tablet (4 mg total) by mouth every 8 (eight) hours as needed for  nausea or vomiting. 20 tablet 0   oxazepam (SERAX) 15 MG capsule Take 1 capsule (15 mg total) by mouth 3 (three) times daily as needed for sleep. Cancel any other prescriptions on file 90 capsule 3   polyethylene glycol (MIRALAX / GLYCOLAX) packet Take 17 g by mouth daily as needed for moderate constipation.      potassium chloride 20 MEQ/15ML (10%) SOLN TAKE 7.5ML (10MEQ) BY MOUTH DAILY AS NEEDED FOR CRAMPING (Patient taking differently: Take 10 mEq by mouth daily as needed (cramping).) 480 mL 0   SHINGRIX injection  sucralfate (CARAFATE) 1 g tablet Take 1 tablet (1 g total) by mouth 2 (two) times daily. 180 tablet 3   triamcinolone ointment (KENALOG) 0.5 % APPLY TO THE AFFECTED AREA(S) TOPICALLY TWICE DAILY 30 g 2   No current facility-administered medications for this visit.    Allergies:   Bisphosphonates, Ciprofloxacin, Codeine, and Sulfa antibiotics    Social History:  The patient  reports that she quit smoking about 16 years ago. Her smoking use included cigarettes. She has never used smokeless tobacco. She reports that she does not drink alcohol and does not use drugs.   Family History:  The patient's family history includes Alcohol abuse in her father; Cancer in her brother, brother, and sister; Dementia in her brother; Early death in her brother; Goiter in her brother and father; Heart disease in her brother and sister; Hernia in her brother; Hyperlipidemia in her brother.    ROS:  Please see the history of present illness.   Otherwise, review of systems are positive for none.   All other systems are reviewed and negative.    PHYSICAL EXAM: VS:  BP 126/72   Pulse 97   Ht '5\' 1"'$  (1.549 m)   Wt 105 lb 9.6 oz (47.9 kg)   SpO2 97%   BMI 19.95 kg/m  , BMI Body mass index is 19.95 kg/m. GENERAL:  Well appearing HEENT:  Pupils equal round and reactive, fundi not visualized, oral mucosa unremarkable NECK:  No jugular venous distention, waveform within normal limits, carotid  upstroke brisk and symmetric, no bruits, no thyromegaly LUNGS:  Clear to auscultation bilaterally HEART:  RRR.  PMI not displaced or sustained,S1 and S2 within normal limits, no S3, no S4, no clicks, no rubs, II/VI diastolic murmur ABD:  Flat, positive bowel sounds normal in frequency in pitch, no bruits, no rebound, no guarding, no midline pulsatile mass, no hepatomegaly, no splenomegaly EXT:  2 plus pulses throughout, no edema, no cyanosis no clubbing SKIN:  No rashes no nodules NEURO:  Cranial nerves II through XII grossly intact, motor grossly intact throughout PSYCH:  Cognitively intact, oriented to person place and time   EKG:  EKG is ordered today. The ekg ordered today demonstrates sinus rhythm.  Rate 92 bpm.  RBBB.  PVC.  Low voltage.    Echo 04/13/17: Study Conclusions   - Left ventricle: The cavity size was normal. Wall thickness was    normal. Systolic function was normal. The estimated ejection    fraction was in the range of 55% to 60%. Doppler parameters are    consistent with abnormal left ventricular relaxation (grade 1    diastolic dysfunction). LA pressure is borderline elevated.  - Aortic valve: There was moderate regurgitation. Valve area (VTI):    1.7 cm^2. Valve area (Vmax): 1.75 cm^2. Valve area (Vmean): 1.52    cm^2.  - Mitral valve: The findings are consistent with mild to moderate    stenosis. MV with doming appearance suggesting of rheumatic valve    disease. There was mild regurgitation. Mean gradient (D): 5 mm    Hg. Valve area by continuity equation (using LVOT flow): 1.08    cm^2.  - Left atrium: The atrium was moderately dilated.  - Technically adequat estudy.   Echo 03/27/2022: IMPRESSIONS     1. Left ventricular ejection fraction, by estimation, is 60 to 65%. The  left ventricle has normal function. The left ventricle has no regional  wall motion abnormalities. Left ventricular diastolic parameters are  consistent with  Grade III diastolic   dysfunction (restrictive).   2. Right ventricular systolic function is normal. The right ventricular  size is normal. There is mildly elevated pulmonary artery systolic  pressure.   3. Left atrial size was severely dilated.   4. Right atrial size was mildly dilated.   5. 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 Mild  mitral stenosis.   6. Tricuspid valve regurgitation is moderate.   7. The aortic valve is tricuspid. Aortic valve regurgitation is severe.  No aortic stenosis is present.   8. The inferior vena cava is normal in size with greater than 50%  respiratory variability, suggesting right atrial pressure of 3 mmHg.  Recent Labs: 03/26/2022: TSH <0.010 04/08/2022: ALT 14; BUN 11; Creatinine, Ser 1.09; Hemoglobin 12.3; Magnesium 1.7; Platelets 284; Potassium 4.5; Sodium 143    Lipid Panel    Component Value Date/Time   CHOL 145 11/27/2021 1135   CHOL 122 07/23/2013 0858   TRIG 134 11/27/2021 1135   TRIG 116 08/06/2015 1221   TRIG 89 07/23/2013 0858   HDL 42 11/27/2021 1135   HDL 56 08/06/2015 1221   HDL 47 07/23/2013 0858   CHOLHDL 3.5 11/27/2021 1135   LDLCALC 79 11/27/2021 1135   LDLCALC 63 07/17/2014 0929   LDLCALC 57 07/23/2013 0858      Wt Readings from Last 3 Encounters:  05/24/22 105 lb 9.6 oz (47.9 kg)  05/04/22 102 lb (46.3 kg)  04/26/22 103 lb (46.7 kg)      ASSESSMENT AND PLAN:  Atrial fibrillation with RVR (HCC) She had afib with RVR in the hospital.  Currently in sinus rhythm.  Given her mitral stenosis, it is likley that she will have recurrent atrial fibrillation in the future.  TSH wa not stable.  Her levothyroxine dose was adjusted in the hospital and she wonders where it is now.  We will check a TSH, T3, and free T4 today.  Continue Eliquis and metoprolol.  If she has recurrent episode we will need to think about rhythm control strategies as she was symptomatic.  Essential hypertension Blood pressure controlled on metoprolol.    PAC (premature atrial contraction) Noted on EKG today.  She is asymptomatic.  Continue metoprolol and check thyroid function as above.  Hyperlipemia Continue atorvastatin.  Mitral stenosis Mean gradient 6 mmHg.  She has had a history of mitral stenosis for several years.  She had does have very mild functional lower extremity edema.  Continue furosemide.  Repeat echo in 4 months.  Aortic regurgitation She had reported severe aortic regurgitation on her echo.  This was in the setting of A-fib with RVR.  And seem more moderate on my review.  Repeat her echo in 4 months as above.  Continue with blood pressure control and diuresis.  Chronic diastolic heart failure ([place]) Echo report revealed grade 3 diastolic dysfunction.  I am not sure how accurate this is in the setting of mitral stenosis.  Continue with diuresis as above.  She did not have any significant LVH or pericardial disease which would raise the concern for constriction or restriction.  Continue to monitor for now and repeat echo in 4 months as above.  No evidence of infiltrative cardiomyopathy.   Current medicines are reviewed at length with the patient today.  The patient does not have concerns regarding medicines.  The following changes have been made:  no change  Labs/ tests ordered today include:   Orders Placed This Encounter  Procedures  TSH+T4F+T3Free   EKG 12-Lead   ECHOCARDIOGRAM COMPLETE     Disposition:   FU with Monette Omara C. Oval Linsey, MD, [facility]  in 4 months.     Signed, Jasenia Weilbacher C. Oval Linsey, MD, [facility]  05/24/2022 3:09 PM    [facility]

## 2022-05-24 NOTE — Assessment & Plan Note (Signed)
Blood pressure controlled on metoprolol.

## 2022-05-24 NOTE — Assessment & Plan Note (Signed)
She had afib with RVR in the hospital.  Currently in sinus rhythm.  Given her mitral stenosis, it is likley that she will have recurrent atrial fibrillation in the future.  TSH wa not stable.  Her levothyroxine dose was adjusted in the hospital and she wonders where it is now.  We will check a TSH, T3, and free T4 today.  Continue Eliquis and metoprolol.  If she has recurrent episode we will need to think about rhythm control strategies as she was symptomatic.

## 2022-05-24 NOTE — Patient Instructions (Signed)
Medication Instructions:  Your Physician recommend you continue on your current medication as directed.    *If you need a refill on your cardiac medications before your next appointment, please call your pharmacy*   Lab Work: Your physician recommends that you return for lab work today- TSH w/ T3 and T4   If you have labs (blood work) drawn today and your tests are completely normal, you will receive your results only by: [place] (if you have MyChart) OR A paper copy in the mail If you have any lab test that is abnormal or we need to change your treatment, we will call you to review the results.   Testing/Procedures: Your physician has requested that you have an echocardiogram in 4 months. Echocardiography is a painless test that uses sound waves to create images of your heart. It provides your doctor with information about the size and shape of your heart and how well your heart's chambers and valves are working. This procedure takes approximately one hour. There are no restrictions for this procedure. [place], you and your health needs are our priority.  As part of our continuing mission to provide you with exceptional heart care, we have created designated Provider Care Teams.  These Care Teams include your primary Cardiologist (physician) and Advanced Practice Providers (APPs -  Physician Assistants and Nurse Practitioners) who all work together to provide you with the care you need, when you need it.  We recommend signing up for the patient portal called "MyChart".  Sign up information is provided on this After Visit Summary.  MyChart is used to connect with patients for Virtual Visits (Telemedicine).  Patients are able to view lab/test results, encounter notes, upcoming appointments, etc.  Non-urgent messages can be sent to your provider as well.   To learn more about what you can do with MyChart, go to  [email or web address].    Your next appointment:   Please schedule an appointment for 4-5 months after your echocardiogram.   Other Instructions Heart Healthy Diet Recommendations: A low-salt diet is recommended. Meats should be grilled, baked, or boiled. Avoid fried foods. Focus on lean protein sources like fish or chicken with vegetables and fruits. The American Heart Association is a Microbiologist!  American Heart Association Diet and Lifeystyle Recommendations   Exercise recommendations: The American Heart Association recommends 150 minutes of moderate intensity exercise weekly. Try 30 minutes of moderate intensity exercise 4-5 times per week. This could include walking, jogging, or swimming.   Important Information About Sugar

## 2022-05-24 NOTE — Assessment & Plan Note (Signed)
Continue atorvastatin

## 2022-05-25 ENCOUNTER — Other Ambulatory Visit: Payer: Self-pay | Admitting: Family Medicine

## 2022-05-25 DIAGNOSIS — F411 Generalized anxiety disorder: Secondary | ICD-10-CM

## 2022-05-25 LAB — TSH+T4F+T3FREE
Free T4: 1.67 ng/dL (ref 0.82–1.77)
T3, Free: 3.3 pg/mL (ref 2.0–4.4)
TSH: 0.028 u[IU]/mL — ABNORMAL LOW (ref 0.450–4.500)

## 2022-05-26 ENCOUNTER — Telehealth: Payer: Self-pay | Admitting: Family Medicine

## 2022-05-26 DIAGNOSIS — F411 Generalized anxiety disorder: Secondary | ICD-10-CM

## 2022-05-27 ENCOUNTER — Telehealth: Payer: Medicare HMO

## 2022-05-27 MED ORDER — OXAZEPAM 15 MG PO CAPS: 15.0000 mg | ORAL_CAPSULE | Freq: Three times a day (TID) | ORAL | 1 refills | Status: DC | PRN

## 2022-05-27 NOTE — Telephone Encounter (Signed)
Sent refill to [place]

## 2022-06-01 ENCOUNTER — Other Ambulatory Visit: Payer: Self-pay | Admitting: Orthopaedic Surgery

## 2022-06-01 ENCOUNTER — Ambulatory Visit (INDEPENDENT_AMBULATORY_CARE_PROVIDER_SITE_OTHER): Payer: Medicare HMO | Admitting: Orthopaedic Surgery

## 2022-06-01 ENCOUNTER — Encounter: Payer: Self-pay | Admitting: Orthopaedic Surgery

## 2022-06-01 ENCOUNTER — Ambulatory Visit (INDEPENDENT_AMBULATORY_CARE_PROVIDER_SITE_OTHER): Payer: Medicare HMO

## 2022-06-01 DIAGNOSIS — S42202D Unspecified fracture of upper end of left humerus, subsequent encounter for fracture with routine healing: Secondary | ICD-10-CM | POA: Diagnosis not present

## 2022-06-01 NOTE — Progress Notes (Signed)
I am better.  She is wearing her sling on the left.  She is left hand dominant.  NV intact.  X-rays were done of the left shoulder, reported separately.  Encounter Diagnosis  Name Primary?   Closed traumatic nondisplaced fracture of proximal end of left humerus with routine healing, subsequent encounter Yes   Gradually come out of sling in the house.  Limit weight lifting to no more then two pounds.  Return in two weeks.  X-rays on return of the left shoulder.  Call if any problem.  Precautions discussed.  Electronically Signed Sanjuana Kava, MD 8/15/202310:08 AM

## 2022-06-03 ENCOUNTER — Other Ambulatory Visit: Payer: Self-pay

## 2022-06-03 ENCOUNTER — Encounter (HOSPITAL_COMMUNITY): Payer: Self-pay

## 2022-06-03 ENCOUNTER — Emergency Department (HOSPITAL_COMMUNITY): Payer: Medicare HMO

## 2022-06-03 ENCOUNTER — Inpatient Hospital Stay (HOSPITAL_COMMUNITY)
Admission: EM | Admit: 2022-06-03 | Discharge: 2022-06-07 | DRG: 308 | Disposition: A | Discharge status: Home Health | Payer: Medicare HMO | Attending: Internal Medicine | Admitting: Internal Medicine

## 2022-06-03 DIAGNOSIS — Z881 Allergy status to other antibiotic agents status: Secondary | ICD-10-CM

## 2022-06-03 DIAGNOSIS — I959 Hypotension, unspecified: Secondary | ICD-10-CM | POA: Diagnosis present

## 2022-06-03 DIAGNOSIS — I083 Combined rheumatic disorders of mitral, aortic and tricuspid valves: Secondary | ICD-10-CM | POA: Diagnosis present

## 2022-06-03 DIAGNOSIS — M810 Age-related osteoporosis without current pathological fracture: Secondary | ICD-10-CM | POA: Diagnosis present

## 2022-06-03 DIAGNOSIS — F419 Anxiety disorder, unspecified: Secondary | ICD-10-CM | POA: Diagnosis not present

## 2022-06-03 DIAGNOSIS — Z83438 Family history of other disorder of lipoprotein metabolism and other lipidemia: Secondary | ICD-10-CM

## 2022-06-03 DIAGNOSIS — I5033 Acute on chronic diastolic (congestive) heart failure: Secondary | ICD-10-CM | POA: Diagnosis not present

## 2022-06-03 DIAGNOSIS — Z79899 Other long term (current) drug therapy: Secondary | ICD-10-CM

## 2022-06-03 DIAGNOSIS — I480 Paroxysmal atrial fibrillation: Principal | ICD-10-CM | POA: Diagnosis present

## 2022-06-03 DIAGNOSIS — Z87891 Personal history of nicotine dependence: Secondary | ICD-10-CM | POA: Diagnosis not present

## 2022-06-03 DIAGNOSIS — Z809 Family history of malignant neoplasm, unspecified: Secondary | ICD-10-CM

## 2022-06-03 DIAGNOSIS — I4891 Unspecified atrial fibrillation: Secondary | ICD-10-CM | POA: Diagnosis not present

## 2022-06-03 DIAGNOSIS — K219 Gastro-esophageal reflux disease without esophagitis: Secondary | ICD-10-CM | POA: Diagnosis present

## 2022-06-03 DIAGNOSIS — Z888 Allergy status to other drugs, medicaments and biological substances status: Secondary | ICD-10-CM | POA: Diagnosis not present

## 2022-06-03 DIAGNOSIS — E039 Hypothyroidism, unspecified: Secondary | ICD-10-CM | POA: Diagnosis not present

## 2022-06-03 DIAGNOSIS — Z7901 Long term (current) use of anticoagulants: Secondary | ICD-10-CM | POA: Diagnosis not present

## 2022-06-03 DIAGNOSIS — Z882 Allergy status to sulfonamides status: Secondary | ICD-10-CM

## 2022-06-03 DIAGNOSIS — Z8249 Family history of ischemic heart disease and other diseases of the circulatory system: Secondary | ICD-10-CM

## 2022-06-03 DIAGNOSIS — I10 Essential (primary) hypertension: Secondary | ICD-10-CM | POA: Diagnosis present

## 2022-06-03 DIAGNOSIS — I471 Supraventricular tachycardia: Secondary | ICD-10-CM | POA: Diagnosis not present

## 2022-06-03 DIAGNOSIS — Z85828 Personal history of other malignant neoplasm of skin: Secondary | ICD-10-CM

## 2022-06-03 DIAGNOSIS — E785 Hyperlipidemia, unspecified: Secondary | ICD-10-CM | POA: Diagnosis present

## 2022-06-03 DIAGNOSIS — I4510 Unspecified right bundle-branch block: Secondary | ICD-10-CM | POA: Diagnosis present

## 2022-06-03 DIAGNOSIS — E034 Atrophy of thyroid (acquired): Secondary | ICD-10-CM | POA: Diagnosis not present

## 2022-06-03 DIAGNOSIS — J439 Emphysema, unspecified: Secondary | ICD-10-CM | POA: Diagnosis not present

## 2022-06-03 DIAGNOSIS — I110 Hypertensive heart disease with heart failure: Secondary | ICD-10-CM | POA: Diagnosis not present

## 2022-06-03 DIAGNOSIS — I351 Nonrheumatic aortic (valve) insufficiency: Secondary | ICD-10-CM | POA: Diagnosis not present

## 2022-06-03 DIAGNOSIS — Z885 Allergy status to narcotic agent status: Secondary | ICD-10-CM | POA: Diagnosis not present

## 2022-06-03 DIAGNOSIS — Z79890 Hormone replacement therapy (postmenopausal): Secondary | ICD-10-CM

## 2022-06-03 DIAGNOSIS — R0600 Dyspnea, unspecified: Secondary | ICD-10-CM | POA: Diagnosis not present

## 2022-06-03 DIAGNOSIS — R002 Palpitations: Secondary | ICD-10-CM | POA: Diagnosis not present

## 2022-06-03 DIAGNOSIS — I050 Rheumatic mitral stenosis: Secondary | ICD-10-CM | POA: Diagnosis not present

## 2022-06-03 HISTORY — DX: Unspecified atrial fibrillation: I48.91

## 2022-06-03 LAB — CBC
HCT: 37.7 % (ref 36.0–46.0)
Hemoglobin: 11.8 g/dL — ABNORMAL LOW (ref 12.0–15.0)
MCH: 29.8 pg (ref 26.0–34.0)
MCHC: 31.3 g/dL (ref 30.0–36.0)
MCV: 95.2 fL (ref 80.0–100.0)
Platelets: 266 10*3/uL (ref 150–400)
RBC: 3.96 MIL/uL (ref 3.87–5.11)
RDW: 14.9 % (ref 11.5–15.5)
WBC: 8 10*3/uL (ref 4.0–10.5)
nRBC: 0 % (ref 0.0–0.2)

## 2022-06-03 LAB — COMPREHENSIVE METABOLIC PANEL
ALT: 11 U/L (ref 0–44)
AST: 19 U/L (ref 15–41)
Albumin: 3.5 g/dL (ref 3.5–5.0)
Alkaline Phosphatase: 99 U/L (ref 38–126)
Anion gap: 6 (ref 5–15)
BUN: 14 mg/dL (ref 8–23)
CO2: 28 mmol/L (ref 22–32)
Calcium: 8.8 mg/dL — ABNORMAL LOW (ref 8.9–10.3)
Chloride: 106 mmol/L (ref 98–111)
Creatinine, Ser: 1.13 mg/dL — ABNORMAL HIGH (ref 0.44–1.00)
GFR, Estimated: 48 mL/min — ABNORMAL LOW (ref >60)
Glucose, Bld: 112 mg/dL — ABNORMAL HIGH (ref 70–99)
Potassium: 4.2 mmol/L (ref 3.5–5.1)
Sodium: 140 mmol/L (ref 135–145)
Total Bilirubin: 0.6 mg/dL (ref 0.3–1.2)
Total Protein: 7.2 g/dL (ref 6.5–8.1)

## 2022-06-03 LAB — TSH: TSH: 0.031 u[IU]/mL — ABNORMAL LOW (ref 0.350–4.500)

## 2022-06-03 LAB — TROPONIN I (HIGH SENSITIVITY)
Troponin I (High Sensitivity): 6 ng/L (ref <18)
Troponin I (High Sensitivity): 7 ng/L (ref <18)

## 2022-06-03 MED ORDER — LACTATED RINGERS IV BOLUS
1000.0000 mL | Freq: Once | INTRAVENOUS | Status: AC
  Administered 2022-06-03: 1000 mL via INTRAVENOUS

## 2022-06-03 MED ORDER — DILTIAZEM HCL-DEXTROSE 125-5 MG/125ML-% IV SOLN (PREMIX)
5.0000 mg/h | INTRAVENOUS | Status: DC
  Administered 2022-06-03 – 2022-06-04 (×2): 5 mg/h via INTRAVENOUS
  Filled 2022-06-03 (×2): qty 125

## 2022-06-03 MED ORDER — DILTIAZEM HCL 25 MG/5ML IV SOLN
10.0000 mg | Freq: Once | INTRAVENOUS | Status: AC
  Administered 2022-06-03: 10 mg via INTRAVENOUS
  Filled 2022-06-03: qty 5

## 2022-06-03 NOTE — ED Triage Notes (Signed)
Pt presents to ED, sent from urgent care with spouse with c/o heart palpatations, some SOB with exertion, and chest tightness earlier, pt has hx of A-fib, is on eliquis and metoprolol, pt has taken meds today, took last dose of metoprolol between 5-6 pm today. Chest tightness is resolved per pt.

## 2022-06-04 ENCOUNTER — Encounter (HOSPITAL_COMMUNITY): Payer: Self-pay | Admitting: Family Medicine

## 2022-06-04 DIAGNOSIS — I4891 Unspecified atrial fibrillation: Secondary | ICD-10-CM

## 2022-06-04 DIAGNOSIS — I351 Nonrheumatic aortic (valve) insufficiency: Secondary | ICD-10-CM | POA: Diagnosis not present

## 2022-06-04 DIAGNOSIS — E034 Atrophy of thyroid (acquired): Secondary | ICD-10-CM | POA: Diagnosis not present

## 2022-06-04 DIAGNOSIS — I050 Rheumatic mitral stenosis: Secondary | ICD-10-CM | POA: Diagnosis not present

## 2022-06-04 DIAGNOSIS — K219 Gastro-esophageal reflux disease without esophagitis: Secondary | ICD-10-CM

## 2022-06-04 DIAGNOSIS — I10 Essential (primary) hypertension: Secondary | ICD-10-CM

## 2022-06-04 LAB — CBC WITH DIFFERENTIAL/PLATELET
Abs Immature Granulocytes: 0.04 10*3/uL (ref 0.00–0.07)
Basophils Absolute: 0.1 10*3/uL (ref 0.0–0.1)
Basophils Relative: 1 %
Eosinophils Absolute: 0.2 10*3/uL (ref 0.0–0.5)
Eosinophils Relative: 3 %
HCT: 33.9 % — ABNORMAL LOW (ref 36.0–46.0)
Hemoglobin: 10.7 g/dL — ABNORMAL LOW (ref 12.0–15.0)
Immature Granulocytes: 1 %
Lymphocytes Relative: 19 %
Lymphs Abs: 1.7 10*3/uL (ref 0.7–4.0)
MCH: 30.1 pg (ref 26.0–34.0)
MCHC: 31.6 g/dL (ref 30.0–36.0)
MCV: 95.2 fL (ref 80.0–100.0)
Monocytes Absolute: 0.7 10*3/uL (ref 0.1–1.0)
Monocytes Relative: 8 %
Neutro Abs: 6.2 10*3/uL (ref 1.7–7.7)
Neutrophils Relative %: 68 %
Platelets: 222 10*3/uL (ref 150–400)
RBC: 3.56 MIL/uL — ABNORMAL LOW (ref 3.87–5.11)
RDW: 14.9 % (ref 11.5–15.5)
WBC: 8.9 10*3/uL (ref 4.0–10.5)
nRBC: 0 % (ref 0.0–0.2)

## 2022-06-04 LAB — COMPREHENSIVE METABOLIC PANEL
ALT: 12 U/L (ref 0–44)
AST: 23 U/L (ref 15–41)
Albumin: 3.1 g/dL — ABNORMAL LOW (ref 3.5–5.0)
Alkaline Phosphatase: 88 U/L (ref 38–126)
Anion gap: 5 (ref 5–15)
BUN: 13 mg/dL (ref 8–23)
CO2: 28 mmol/L (ref 22–32)
Calcium: 8.9 mg/dL (ref 8.9–10.3)
Chloride: 108 mmol/L (ref 98–111)
Creatinine, Ser: 0.94 mg/dL (ref 0.44–1.00)
GFR, Estimated: >60 mL/min (ref >60)
Glucose, Bld: 113 mg/dL — ABNORMAL HIGH (ref 70–99)
Potassium: 4.4 mmol/L (ref 3.5–5.1)
Sodium: 141 mmol/L (ref 135–145)
Total Bilirubin: 0.7 mg/dL (ref 0.3–1.2)
Total Protein: 6.4 g/dL — ABNORMAL LOW (ref 6.5–8.1)

## 2022-06-04 LAB — URINALYSIS, COMPLETE (UACMP) WITH MICROSCOPIC
Bilirubin Urine: NEGATIVE
Glucose, UA: NEGATIVE mg/dL
Hgb urine dipstick: NEGATIVE
Ketones, ur: NEGATIVE mg/dL
Leukocytes,Ua: NEGATIVE
Nitrite: NEGATIVE
Protein, ur: 30 mg/dL — AB
Specific Gravity, Urine: 1.016 (ref 1.005–1.030)
pH: 6 (ref 5.0–8.0)

## 2022-06-04 LAB — MAGNESIUM: Magnesium: 1.6 mg/dL — ABNORMAL LOW (ref 1.7–2.4)

## 2022-06-04 LAB — MRSA NEXT GEN BY PCR, NASAL: MRSA by PCR Next Gen: NOT DETECTED

## 2022-06-04 LAB — T4, FREE: Free T4: 1.43 ng/dL — ABNORMAL HIGH (ref 0.61–1.12)

## 2022-06-04 MED ORDER — LEVALBUTEROL HCL 0.63 MG/3ML IN NEBU
0.6300 mg | INHALATION_SOLUTION | Freq: Four times a day (QID) | RESPIRATORY_TRACT | Status: DC | PRN
  Administered 2022-06-05: 0.63 mg via RESPIRATORY_TRACT
  Filled 2022-06-04: qty 3

## 2022-06-04 MED ORDER — ACETAMINOPHEN 325 MG PO TABS
650.0000 mg | ORAL_TABLET | Freq: Four times a day (QID) | ORAL | Status: DC | PRN
  Administered 2022-06-04 – 2022-06-06 (×5): 650 mg via ORAL
  Filled 2022-06-04 (×5): qty 2

## 2022-06-04 MED ORDER — MIRTAZAPINE 15 MG PO TBDP: 15.0000 mg | ORAL_TABLET | Freq: Every day | ORAL | Status: DC

## 2022-06-04 MED ORDER — LORAZEPAM 1 MG PO TABS
1.0000 mg | ORAL_TABLET | Freq: Every evening | ORAL | Status: DC | PRN
  Administered 2022-06-04: 1 mg via ORAL
  Filled 2022-06-04: qty 1

## 2022-06-04 MED ORDER — OXYCODONE HCL 5 MG PO TABS: 5.0000 mg | ORAL_TABLET | ORAL | Status: DC | PRN

## 2022-06-04 MED ORDER — MAGNESIUM SULFATE 2 GM/50ML IV SOLN
2.0000 g | Freq: Once | INTRAVENOUS | Status: AC
  Administered 2022-06-04: 2 g via INTRAVENOUS
  Filled 2022-06-04: qty 50

## 2022-06-04 MED ORDER — APIXABAN 2.5 MG PO TABS
2.5000 mg | ORAL_TABLET | Freq: Two times a day (BID) | ORAL | Status: DC
  Administered 2022-06-04 – 2022-06-07 (×8): 2.5 mg via ORAL
  Filled 2022-06-04 (×8): qty 1

## 2022-06-04 MED ORDER — METOPROLOL TARTRATE 25 MG PO TABS
25.0000 mg | ORAL_TABLET | Freq: Two times a day (BID) | ORAL | Status: DC | PRN
Start: 2022-06-04 — End: 2022-06-05
  Administered 2022-06-04 – 2022-06-05 (×2): 25 mg via ORAL
  Filled 2022-06-04 (×2): qty 1

## 2022-06-04 MED ORDER — LEVOTHYROXINE SODIUM 50 MCG PO TABS
25.0000 ug | ORAL_TABLET | Freq: Every day | ORAL | Status: DC
  Administered 2022-06-04: 25 ug via ORAL
  Filled 2022-06-04: qty 1

## 2022-06-04 MED ORDER — ONDANSETRON HCL 4 MG PO TABS: 4.0000 mg | ORAL_TABLET | Freq: Four times a day (QID) | ORAL | Status: DC | PRN

## 2022-06-04 MED ORDER — POLYETHYLENE GLYCOL 3350 17 G PO PACK: 17.0000 g | PACK | Freq: Every day | ORAL | Status: DC | PRN

## 2022-06-04 MED ORDER — DILTIAZEM HCL 30 MG PO TABS
30.0000 mg | ORAL_TABLET | Freq: Four times a day (QID) | ORAL | Status: DC
  Administered 2022-06-04 – 2022-06-05 (×4): 30 mg via ORAL
  Filled 2022-06-04 (×5): qty 1

## 2022-06-04 MED ORDER — CHLORHEXIDINE GLUCONATE CLOTH 2 % EX PADS
6.0000 | MEDICATED_PAD | Freq: Every day | CUTANEOUS | Status: DC
  Administered 2022-06-04 – 2022-06-07 (×4): 6 via TOPICAL

## 2022-06-04 MED ORDER — ENSURE ENLIVE PO LIQD
237.0000 mL | Freq: Two times a day (BID) | ORAL | Status: DC
Start: 2022-06-04 — End: 2022-06-07
  Administered 2022-06-04 – 2022-06-06 (×4): 237 mL via ORAL

## 2022-06-04 MED ORDER — ACETAMINOPHEN 650 MG RE SUPP: 650.0000 mg | Freq: Four times a day (QID) | RECTAL | Status: DC | PRN

## 2022-06-04 MED ORDER — PANTOPRAZOLE SODIUM 40 MG PO TBEC
40.0000 mg | DELAYED_RELEASE_TABLET | Freq: Every day | ORAL | Status: DC
  Administered 2022-06-04 – 2022-06-07 (×4): 40 mg via ORAL
  Filled 2022-06-04 (×4): qty 1

## 2022-06-04 MED ORDER — ONDANSETRON HCL 4 MG/2ML IJ SOLN
4.0000 mg | Freq: Four times a day (QID) | INTRAMUSCULAR | Status: DC | PRN
  Administered 2022-06-04: 4 mg via INTRAVENOUS
  Filled 2022-06-04: qty 2

## 2022-06-04 NOTE — Assessment & Plan Note (Signed)
Systolic blood pressure 90 to 116 mmHg. Continue diuresis with furosemide and rate control with diltiazem and metoprolol.

## 2022-06-04 NOTE — TOC Progression Note (Signed)
  Transition of Care [facility]) Screening Note   Patient Details  Name: [name] Date of Birth: 1941-03-20   Transition of Care [facility]) CM/SW Contact:    Boneta Lucks, RN Phone Number: 06/04/2022, 1:29 PM  Cardiology following.  Transition of Care Department [facility]) has reviewed patient and no TOC needs have been identified at this time. We will continue to monitor patient advancement through interdisciplinary progression rounds. If new patient transition needs arise, please place a TOC consult.      Barriers to Discharge: Continued Medical Work up  Expected Discharge Plan and Services

## 2022-06-04 NOTE — Consult Note (Signed)
Cardiology Consultation:   Patient ID: [name] MRN: [id number]; DOB: May 07, 1941  Admit date: 06/03/2022 Date of Consult: 06/04/2022  PCP:  Dettinger, Fransisca Kaufmann, MD   [facility] Providers Cardiologist:  Skeet Latch, MD        Patient Profile:   [name] is a 81 y.o. female with a hx of paroxysmal atrial fibrillation (documented during admission in 03/2022 and converted to NSR with IV Cardizem), valvular heart disease (echo in 03/2022 showing mild MR, mild MS and severe AI)  COPD, Hypothyroidism and GERD who is being seen 06/04/2022 for the evaluation of atrial fibrillation with RVR at the request of Dr. Clearence Ped.  History of Present Illness:   [name] was last examined by Dr. Oval Linsey on 05/24/2022 and reported having frequent diarrhea prior to her recent hospitalization for atrial fibrillation and had mostly experienced dyspnea when in the arrhythmia. She was maintaining normal sinus rhythm at the time of her follow-up visit and was continued on Eliquis 2.5 mg twice daily along with Lopressor 25 mg twice daily. It was mentioned if she had recurrent episodes, would need to consider rhythm control strategies given her valvular heart disease. It was recommended to repeat an echocardiogram in 4 months for reassessment of her severe AI.  Follow-up labs showed her TSH had slightly improved to 0.028 with Free T4 at 1.67.  She presented to [facility] ED on 06/03/2022 for evaluation of palpitations, chest tightness and dyspnea which had started earlier in the day. Initial labs showed WBC 8.0, Hgb 11.8, platelets 266, Na+ 140, K+ 4.2 and creatinine 1.13 (close to baseline).  AST 19 and ALT 11.  Mg 1.6. TSH low at 0.031 with Free T3 and T4 pending. Initial and repeat Hs Troponin values were negative at 6 and 7. CXR with no acute cardiopulmonary abnormalities. EKG shows baseline artifact but appears most consistent with atrial fibrillation, HR 71 with PVC's and known RBBB.   She  was started on IV Cardizem and also received a 1L fluid bolus given hypotension.   In talking with the patient and her husband today, she reports feeling short of breath yesterday and used her pulse oximeter and noted her HR was in the 140's to 150's. Says she could feel a pounding sensation along her epigastric region but no specific chest pain. She denies any recent orthopnea or PND but does take Lasix '20mg'$  for lower extremity edema. She has remained on Eliquis for anticoagulation with no reports of active bleeding. No recurrent diarrhea as she was experiencing during her prior admission.    Past Medical History:  Diagnosis Date   A-fib [facility])    Anxiety states    Aortic regurgitation 05/24/2022   Atrial fibrillation (HCC)    Breast cyst    Cancer (HCC)    skin   Cataract    Chronic diastolic heart failure ([place]) 05/24/2022   Constipation    Esophageal stricture    Essential hypertension 12/18/2015   Fracture, rib    GERD (gastroesophageal reflux disease)    Hiatal hernia    Lower extremity edema 12/18/2015   Mitral stenosis 05/24/2022   Osteoporosis    Other and unspecified hyperlipidemia    PAC (premature atrial contraction) 12/30/2015   Palpitations 12/18/2015   Unspecified hypothyroidism    Vertebral fracture, osteoporotic ([place])     Past Surgical History:  Procedure Laterality Date   ABDOMINAL HYSTERECTOMY     APPENDECTOMY     BREAST BIOPSY     bil  cysts   CATARACT EXTRACTION     ESOPHAGOGASTRODUODENOSCOPY N/A 01/19/2018   Procedure: ESOPHAGOGASTRODUODENOSCOPY (EGD);  Surgeon: Clarene Essex, MD;  Location: [facility];  Service: Endoscopy;  Laterality: N/A;  with flouro   EYE SURGERY     HAND SURGERY Left    IR GENERIC HISTORICAL  12/10/2016   IR RADIOLOGIST EVAL & MGMT 12/10/2016 [facility]   IR GENERIC HISTORICAL  12/23/2016   IR KYPHO THORACIC WITH BONE BIOPSY 12/23/2016 Luanne Bras, MD [facility]   PARTIAL HYSTERECTOMY     THYROID LOBECTOMY     right    VERTEBROPLASTY       Home Medications:  Prior to Admission medications   Medication Sig Start Date End Date Taking? Authorizing Provider  acetaminophen (TYLENOL) 500 MG tablet Take 500 mg by mouth every 8 (eight) hours as needed for mild pain or moderate pain.   Yes [provider]  albuterol (VENTOLIN HFA) 108 (90 Base) MCG/ACT inhaler Inhale 2 puffs into the lungs every 6 (six) hours as needed for wheezing or shortness of breath. 12/10/21  Yes Dettinger, Fransisca Kaufmann, MD  apixaban (ELIQUIS) 2.5 MG TABS tablet Take 1 tablet (2.5 mg total) by mouth 2 (two) times daily. 04/08/22  Yes Dettinger, Fransisca Kaufmann, MD  Cholecalciferol (VITAMIN D3) 2000 units CHEW Chew 4,000 Units by mouth daily.   Yes [provider]  levothyroxine (SYNTHROID) 50 MCG tablet TAKE ONE (1) TABLET BY MOUTH EVERY DAY 05/26/22  Yes Dettinger, Fransisca Kaufmann, MD  loperamide (IMODIUM) 2 MG capsule Take 1 capsule (2 mg total) by mouth 4 (four) times daily as needed for diarrhea or loose stools. 03/27/22  Yes Kathie Dike, MD  metoprolol tartrate (LOPRESSOR) 25 MG tablet Take 1 tablet (25 mg total) by mouth 2 (two) times daily as needed. Patient taking differently: Take 25 mg by mouth 2 (two) times daily as needed (irregular heart rate). 07/27/21  Yes Dettinger, Fransisca Kaufmann, MD  omeprazole (PRILOSEC) 40 MG capsule Take 1 capsule (40 mg total) by mouth daily. 07/27/21  Yes Dettinger, Fransisca Kaufmann, MD  oxazepam (SERAX) 15 MG capsule Take 1 capsule (15 mg total) by mouth 3 (three) times daily as needed for sleep. Cancel any other prescriptions on file 05/27/22  Yes Dettinger, Fransisca Kaufmann, MD  polyethylene glycol (MIRALAX / GLYCOLAX) packet Take 17 g by mouth daily as needed for moderate constipation.    Yes [provider]  potassium chloride 20 MEQ/15ML (10%) SOLN TAKE 7.5ML (10MEQ) BY MOUTH DAILY AS NEEDED FOR CRAMPING Patient taking differently: Take 10 mEq by mouth daily as needed (cramping). 03/26/22  Yes Dettinger, Fransisca Kaufmann, MD   sucralfate (CARAFATE) 1 g tablet Take 1 tablet (1 g total) by mouth 2 (two) times daily. 05/12/22  Yes Dettinger, Fransisca Kaufmann, MD  triamcinolone ointment (KENALOG) 0.5 % APPLY TO THE AFFECTED AREA(S) TOPICALLY TWICE DAILY 05/12/22  Yes Dettinger, Fransisca Kaufmann, MD  furosemide (LASIX) 20 MG tablet Take 1 tablet (20 mg total) by mouth daily. 05/12/22   Dettinger, Fransisca Kaufmann, MD  mirtazapine (REMERON SOL-TAB) 15 MG disintegrating tablet Take 1 tablet (15 mg total) by mouth at bedtime. Patient not taking: Reported on 06/03/2022 05/12/22   Dettinger, Fransisca Kaufmann, MD  Misc. Devices (COMMODE BEDSIDE) MISC Bedside commode due to tibial fracture 07/17/20   Triplett, Tammy, PA-C  Misc. Devices [facility]) MISC Pt needs wheelchair due to extremity fractures 07/17/20   Triplett, Tammy, PA-C  Misc. Devices MISC Use rolling walker to ambulate around the house as  needed. 07/29/20   Mordecai Rasmussen, MD  mometasone (NASONEX) 50 MCG/ACT nasal spray Place 2 sprays into the nose daily. Patient not taking: Reported on 06/03/2022 03/09/22   Ivy Lynn, NP  [facility] injection  11/27/21   [provider]    Inpatient Medications: Scheduled Meds:  apixaban  2.5 mg Oral BID   pantoprazole  40 mg Oral Daily   Continuous Infusions:  diltiazem (CARDIZEM) infusion 15 mg/hr (06/04/22 0323)   magnesium sulfate bolus IVPB     PRN Meds: acetaminophen **OR** acetaminophen, LORazepam, metoprolol tartrate, ondansetron **OR** ondansetron (ZOFRAN) IV, oxyCODONE, polyethylene glycol  Allergies:    Allergies  Allergen Reactions   Bisphosphonates Other (See Comments)    Difficulty swallowing    Ciprofloxacin Nausea And Vomiting   Codeine Nausea And Vomiting   Sulfa Antibiotics Nausea And Vomiting    Social History:   Social History   Socioeconomic History   Marital status: Married    Spouse name: Sports coach   Number of children: 0   Years of education: 8   Highest education level: 8th grade  Occupational History    Occupation: retired    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   Social Determinants of Health   Financial Resource Strain: Medium Risk (12/11/2021)   Overall Financial Resource Strain (CARDIA)    Difficulty of Paying Living Expenses: Somewhat hard  Food Insecurity: Food Insecurity Present (12/11/2021)   Hunger Vital Sign    Worried About Running Out of Food in the Last Year: Sometimes true    Ran Out of Food in the Last Year: Never true  Transportation Needs: No Transportation Needs (12/11/2021)   PRAPARE - Hydrologist (Medical): No    Lack of Transportation (Non-Medical): No  Physical Activity: Insufficiently Active (12/11/2021)   Exercise Vital Sign    Days of Exercise per Week: 7 days    Minutes of Exercise per Session: 20 min  Stress: No Stress Concern Present (12/11/2021)   [place]    Feeling of Stress : Only a little  Social Connections: Moderately Integrated (12/11/2021)   Social Connection and Isolation Panel [NHANES]    Frequency of Communication with Friends and Family: More than three times a week    Frequency of Social Gatherings with Friends and Family: More than three times a week    Attends Religious Services: Never    Marine scientist or Organizations: Yes    Attends Music therapist: More than 4 times per year    Marital Status: Married  Human resources officer Violence: Not At Risk  (12/11/2021)   Humiliation, Afraid, Rape, and Kick questionnaire    Fear of Current or Ex-Partner: No    Emotionally Abused: No    Physically Abused: No    Sexually Abused: No    Family History:    Family  History  Problem Relation Age of Onset   Goiter Father    Alcohol abuse Father    Heart disease Sister    Cancer Sister        breast and skin   Goiter Brother    Dementia Brother    Hyperlipidemia Brother    Hernia Brother    Cancer Brother        throat   Cancer Brother        neck   Heart disease Brother    Early death Brother        auto accident     ROS:  Please see the history of present illness.   All other ROS reviewed and negative.     Physical Exam/Data:   Vitals:   06/04/22 0600 06/04/22 0630 06/04/22 0700 06/04/22 0740  BP: 96/68 (!) 87/47 (!) 94/48 (!) 100/55  Pulse: 66 75 68 65  Resp: 20 (!) 23 (!) 21 (!) 21  Temp:    97.8 F (36.6 C)  TempSrc:    Oral  SpO2:   92% 93%  Weight:      Height:        Intake/Output Summary (Last 24 hours) at 06/04/2022 0802 Last data filed at 06/03/2022 2118 Gross per 24 hour  Intake 1000 ml  Output --  Net 1000 ml      06/03/2022    7:37 PM 05/24/2022    2:33 PM 05/04/2022    9:30 AM  Last 3 Weights  Weight (lbs) 105 lb 105 lb 9.6 oz 102 lb  Weight (kg) 47.628 kg 47.9 kg 46.267 kg     Body mass index is 19.84 kg/m.  General: Pleasant elderly female appearing in no acute distress HEENT: normal Neck: no JVD Vascular: No carotid bruits; Distal pulses 2+ bilaterally Cardiac:  normal S1, S2; Irregularly irregular. 2/6 diastolic murmur along RUSB.  Lungs:  clear to auscultation bilaterally, no wheezing, rhonchi or rales  Abd: soft, nontender, no hepatomegaly  Ext: 1+ pitting edema bilaterally.  Musculoskeletal:  No deformities, BUE and BLE strength normal and equal Skin: warm and dry  Neuro:  CNs 2-12 intact, no focal abnormalities noted Psych:  Normal affect   EKG:  The EKG was personally reviewed and  demonstrates: Baseline artifact but appears most consistent with atrial fibrillation, HR 71 with PVC's and known RBBB.  Telemetry:  Telemetry was personally reviewed and demonstrates: Atrial fibrillation, HR initially in the 140's, now in the 70's to 80's.   Relevant CV Studies:  Echocardiogram: 03/27/2022 IMPRESSIONS     1. Left ventricular ejection fraction, by estimation, is 60 to 65%. The  left ventricle has normal function. The left ventricle has no regional  wall motion abnormalities. Left ventricular diastolic parameters are  consistent with Grade III diastolic  dysfunction (restrictive).   2. Right ventricular systolic function is normal. The right ventricular  size is normal. There is mildly elevated pulmonary artery systolic  pressure.   3. Left atrial size was severely dilated.   4. Right atrial size was mildly dilated.   5. 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 Mild  mitral stenosis.   6. Tricuspid valve regurgitation is moderate.   7. The aortic valve is tricuspid. Aortic valve regurgitation is severe.  No aortic stenosis is present.   8. The inferior vena cava is normal in size with greater than 50%  respiratory variability, suggesting right atrial pressure of 3 mmHg.   Laboratory Data:  High Sensitivity  Troponin:   Recent Labs  Lab 06/03/22 1950 06/03/22 2134  TROPONINIHS 6 7     Chemistry Recent Labs  Lab 06/03/22 2008 06/04/22 0531  NA 140 141  K 4.2 4.4  CL 106 108  CO2 28 28  GLUCOSE 112* 113*  BUN 14 13  CREATININE 1.13* 0.94  CALCIUM 8.8* 8.9  MG  --  1.6*  GFRNONAA 48* >60  ANIONGAP 6 5    Recent Labs  Lab 06/03/22 2008 06/04/22 0531  PROT 7.2 6.4*  ALBUMIN 3.5 3.1*  AST 19 23  ALT 11 12  ALKPHOS 99 88  BILITOT 0.6 0.7   Lipids No results for input(s): "CHOL", "TRIG", "HDL", "LABVLDL", "LDLCALC", "CHOLHDL" in the last 168 hours.  Hematology Recent Labs  Lab 06/03/22 1950 06/04/22 0531  WBC 8.0 8.9   RBC 3.96 3.56*  HGB 11.8* 10.7*  HCT 37.7 33.9*  MCV 95.2 95.2  MCH 29.8 30.1  MCHC 31.3 31.6  RDW 14.9 14.9  PLT 266 222   Thyroid  Recent Labs  Lab 06/03/22 2004  TSH 0.031*    BNPNo results for input(s): "BNP", "PROBNP" in the last 168 hours.  DDimer No results for input(s): "DDIMER" in the last 168 hours.   Radiology/Studies:  DG Chest Portable 1 View  Result Date: 06/03/2022 CLINICAL DATA:  Heart palpitations and dyspnea with exertion EXAM: PORTABLE CHEST 1 VIEW COMPARISON:  Radiographs 03/26/2022 FINDINGS: Stable enlargement cardiomediastinal silhouette given patient rotation. No focal consolidation, pleural effusion, or pneumothorax. Bibasilar and biapical scarring. Interstitial thickening in the right upper lung without discrete consolidation. No acute osseous abnormality. Demineralization. Aortic calcification. Surgical clips right neck. Midthoracic spine vertebroplasty. IMPRESSION: No acute cardiopulmonary process. Aortic Atherosclerosis (ICD10-I70.0) and Emphysema (ICD10-J43.9). Electronically Signed   By: Placido Sou M.D.   On: 06/03/2022 20:01   DG Shoulder Left  Result Date: 06/01/2022 Clinical:  history of left shoulder fracture. X-rays were done of the left shoulder, two views. There is a nondisplaced fracture of the left proximal humerus healing well.  Bone quality is osteoporotic.  Humeral head is well located within the glenoid area.  Apex of lung clear. Impression: healing nondisplaced fracture of the left proximal humerus. Electronically Signed Sanjuana Kava, MD 8/15/202310:04 AM    Assessment and Plan:   1. Atrial Fibrillation with RVR - She has known paroxysmal atrial fibrillation with her first documented episode being in 03/2022 and recurrent again this admission. K+ WNL but TSH remains low and Mg is low at 1.6 (replacement has been ordered). As previously mentioned, she was felt to be at high-risk for recurrence given her valvular heart disease and  severe LA dilation by echocardiogram. Can review antiarrhythmic options with EP. Amiodarone would not be ideal given her thyroid dysfunction but options are likely limited given her valve disease. She is currently asymptomatic with rates in the 70's to 80's and receiving IV Cardizem at 5 mg/hr. Reviewed with the patient's nurse and will try switching to short-acting Cardizem '30mg'$  Q6H and can adjust to CD dosing prior to discharge. So far, she appears to have responded better to Cardizem as opposed to Lopressor as she was taking this PTA but rates remained significantly elevated.  - She has been continued on Eliquis 2.'5mg'$  BID for anticoagulation which is the appropriate dose given her age and renal function. Given her MS is in a mild range, anticipate this can be continued for now.    2. Valvular Heart Disease - Echo in 03/2022 showed mild MR,  mild MS and severe AI. She is scheduled for a follow-up echocardiogram in 09/2022.  3. HFpEF/Lower Extremity Edema - She has edema on examination today but received 1L of fluids while in the ED. Would hold additional fluids for now. She is on Lasix '20mg'$  daily at home and would plan to resume this prior to discharge.   4. Hypothyroidism - TSH has improved over the past few months since being started on Synthroid but remains low at 0.031. Free T3 and T4 pending. Further management per the admitting team.   5. Hypomagnesemia - Mg at 1.6 and supplementation has been ordered.     Risk Assessment/Risk Scores:    CHA2DS2-VASc Score = 4   This indicates a 4.8% annual risk of stroke. The patient's score is based upon: CHF History: 0 HTN History: 1 Diabetes History: 0 Stroke History: 0 Vascular Disease History: 0 Age Score: 2 Gender Score: 1    For questions or updates, please contact [place] Please consult [email or web address] for contact info under    Signed, Erma Heritage, PA-C  06/04/2022 8:02 AM

## 2022-06-04 NOTE — ED Notes (Signed)
Pt converted to NSR

## 2022-06-04 NOTE — Discharge Instructions (Signed)
[facility] Stay Proper nutrition can help your body recover from illness and injury.   Foods and beverages high in protein, vitamins, and minerals help rebuild muscle loss, promote healing, & reduce fall risk.   In addition to eating healthy foods, a nutrition shake is an easy, delicious way to get the nutrition you need during and after your hospital stay  It is recommended that you continue to drink 2 bottles per day of:    High Protein /High calorie nutrition shake for at least 1 month (30 days) after your hospital stay   Tips for adding a nutrition shake into your routine: As allowed, drink one with vitamins or medications instead of water or juice Enjoy one as a tasty mid-morning or afternoon snack Drink cold or make a milkshake out of it Drink one instead of milk with cereal or snacks Use as a coffee creamer   Available at the following grocery stores and pharmacies:           * [place] [phone]            For COUPONS visit: www.ensure.com/join or [email or web address]-[email or web address]   Suggested Substitutions Ensure Plus = Boost Plus = Carnation Breakfast Essentials = Boost Compact Ensure Active Clear = Boost Breeze Glucerna Shake = Boost Glucose Control = Carnation Breakfast Essentials SUGAR FREE

## 2022-06-04 NOTE — Progress Notes (Signed)
[name] is a 81 y.o. female with medical history significant of atrial fibrillation, aortic regurgitation, chronic diastolic heart failure, GERD, mitral stenosis, and more presents to the ER with a chief complaint of chest tightness.  She was admitted with symptomatic atrial fibrillation with RVR and was started on Cardizem drip.  She is noted to have hypothyroidism, but perhaps may be overtreated with her Synthroid as T4 is elevated and TSH is low.  Discontinue levothyroxine for now and replete magnesium.  Appreciate cardiology recommendations to decrease Eliquis dosing based on age and renal function and start oral Cardizem.  Patient seen and evaluated at bedside with admission noted after midnight.  Husband at bedside and questions answered.  Plan to wean Cardizem drip and if tolerating oral doses of Cardizem with adequate heart rate control anticipate discharge in the next 24-48 hours.  Anticipate keeping off of levothyroxine for now as she has also had a 20 pound unintentional weight loss recently.  A.m. labs ordered and magnesium being supplemented.  Total care time: 30 minutes.

## 2022-06-04 NOTE — ED Notes (Signed)
Admitting paged due to hypotension

## 2022-06-04 NOTE — Assessment & Plan Note (Addendum)
Elevated free t4 and low TSH consistent with medical induced hyperthyroid Continue with lower dose of levothyroxine.

## 2022-06-04 NOTE — Progress Notes (Signed)
Initial Nutrition Assessment  DOCUMENTATION CODES:      INTERVENTION:  Ensure Enlive po BID   Nutrition education -HF attached to AVS  Multivitamin daily  NUTRITION DIAGNOSIS:   Inadequate oral intake related to chronic illness (weight loss over time per chart reivew) as evidenced by per patient/family report.   GOAL:  Patient will meet greater than or equal to 90% of their needs   MONITOR:  PO intake, Labs, Supplement acceptance, Weight trends  REASON FOR ASSESSMENT:   Malnutrition Screening Tool    ASSESSMENT: Patient is a 81 yo female with hx of CKD-3, Vitamin D deficiency, Esophageal stricture, hiatal hernia, GERD, CHF, A.fib. Presents short of breath and increased heart rate.   Patient husband is beside. He has been cooking, cleaning, and taking care of laundry since patient shoulder injury. Patient lunch his here 75% consumed. She has fair appetite and eats at regular intervals throughout the day. Able to feed herself after tray set up. Emphasized to patient the importance of limiting salt intake. Heart Failure Nutrition Education attached to AVS.   Patient weight encounters reviewed. 47-49 kg May to August. Weight in February 53 kg.  Mild muscle loss expect to be at least in part- age related. Patient weighs daily.   Medications: Protonix     Latest Ref Rng & Units 06/04/2022    5:31 AM 06/03/2022    8:08 PM 04/08/2022   10:12 AM  BMP  Glucose 70 - 99 mg/dL 113  112  84   BUN 8 - 23 mg/dL '13  14  11   '$ Creatinine 0.44 - 1.00 mg/dL 0.94  1.13  1.09   BUN/Creat Ratio 12 - 28   10   Sodium 135 - 145 mmol/L 141  140  143   Potassium 3.5 - 5.1 mmol/L 4.4  4.2  4.5   Chloride 98 - 111 mmol/L 108  106  102   CO2 22 - 32 mmol/L '28  28  27   '$ Calcium 8.9 - 10.3 mg/dL 8.9  8.8  9.4        NUTRITION - FOCUSED PHYSICAL EXAM:  Flowsheet Row Most Recent Value  Orbital Region Mild depletion  Upper Arm Region No depletion  Thoracic and Lumbar Region No depletion   Buccal Region Mild depletion  Temple Region Mild depletion  Clavicle Bone Region Moderate depletion  Clavicle and Acromion Bone Region Mild depletion  Scapular Bone Region No depletion  Dorsal Hand No depletion  Patellar Region No depletion  Anterior Thigh Region No depletion  Posterior Calf Region No depletion  Edema (RD Assessment) Mild  Hair Reviewed  Eyes Reviewed  Mouth Reviewed  Skin Reviewed  Nails Reviewed      Diet Order:   Diet Order             Diet Heart Room service appropriate? Yes; Fluid consistency: Thin  Diet effective now                   EDUCATION NEEDS:  Education needs have been addressed  Skin:  Skin Assessment: Reviewed RN Assessment  Last BM:  unknown  Height:   Ht Readings from Last 1 Encounters:  06/04/22 '5\' 1"'$  (1.549 m)    Weight:   Wt Readings from Last 1 Encounters:  06/04/22 48.8 kg    Ideal Body Weight:   48 kg  BMI:  Body mass index is 20.33 kg/m.  Estimated Nutritional Needs:   Kcal:  1300-1500  Protein:  60-65  kg  Fluid:  1200 ml daily   Colman Cater MS,RD,CSG,LDN Contact: Shea Evans

## 2022-06-04 NOTE — Assessment & Plan Note (Addendum)
Patient continue with atrial fibrillation with RVR, with rate up to the 130's.  Plan to increase diltiazem to 60 mg q6 hrs  Continue close telemetry monitoring Anticoagulation with apixaban Out of bed to chair tid with meals Pt and Ot.

## 2022-06-04 NOTE — ED Provider Notes (Signed)
[facility] Provider Note  CSN: [id number] Arrival date & time: 06/03/22 1918  Chief Complaint(s) Shortness of Breath (Fast heart rate/Hx -A-fib)  HPI [name] is a 81 y.o. female with PMH A-fib on metoprolol and Eliquis with no missed doses, severe aortic regurgitation, rheumatic heart disease and mild mitral stenosis, esophageal stricture who presents emergency department for evaluation of shortness of breath and rapid heart rate.  Patient presented to urgent care with complaints of palpitations and mild shortness of breath and she was found to be in A-fib with RVR with rates in the 140s and transferred to the emergency department.  She currently denies chest pain, abdominal pain, nausea, vomiting or other systemic symptoms.   Past Medical History Past Medical History:  Diagnosis Date   A-fib [facility])    Anxiety states    Aortic regurgitation 05/24/2022   Atrial fibrillation (HCC)    Breast cyst    Cancer (HCC)    skin   Cataract    Chronic diastolic heart failure ([place]) 05/24/2022   Constipation    Esophageal stricture    Essential hypertension 12/18/2015   Fracture, rib    GERD (gastroesophageal reflux disease)    Hiatal hernia    Lower extremity edema 12/18/2015   Mitral stenosis 05/24/2022   Osteoporosis    Other and unspecified hyperlipidemia    PAC (premature atrial contraction) 12/30/2015   Palpitations 12/18/2015   Unspecified hypothyroidism    Vertebral fracture, osteoporotic ([place])    Patient Active Problem List   Diagnosis Date Noted   Mitral stenosis 05/24/2022   Aortic regurgitation 05/24/2022   Chronic diastolic heart failure ([place]) 05/24/2022   Atrial fibrillation with RVR ([place]) 03/26/2022   Tibial plateau fracture, right, closed, initial encounter 07/17/2020   Prediabetes 02/25/2020   Chronic kidney disease (CKD) stage G3b/A1, moderately decreased glomerular filtration rate (GFR) between 30-44 mL/min/1.73 square meter and  albuminuria creatinine ratio less than 30 mg/g (HCC) 02/22/2018   Atherosclerosis of aorta ([place]) 02/21/2018   PAC (premature atrial contraction) 12/30/2015   Essential hypertension 12/18/2015   Lower extremity edema 12/18/2015   Palpitations 12/18/2015   Hyperlipemia 02/14/2013   Hypothyroid 02/14/2013   Vitamin D deficiency 02/14/2013   Generalized anxiety disorder 02/14/2013   Constipation    Esophageal stricture    Osteoporosis with pathological fracture    GERD (gastroesophageal reflux disease)    Home Medication(s) Prior to Admission medications   Medication Sig Start Date End Date Taking? Authorizing Provider  acetaminophen (TYLENOL) 500 MG tablet Take 500 mg by mouth every 8 (eight) hours as needed for mild pain or moderate pain.   Yes [provider]  albuterol (VENTOLIN HFA) 108 (90 Base) MCG/ACT inhaler Inhale 2 puffs into the lungs every 6 (six) hours as needed for wheezing or shortness of breath. 12/10/21  Yes Dettinger, Fransisca Kaufmann, MD  apixaban (ELIQUIS) 2.5 MG TABS tablet Take 1 tablet (2.5 mg total) by mouth 2 (two) times daily. 04/08/22  Yes Dettinger, Fransisca Kaufmann, MD  Cholecalciferol (VITAMIN D3) 2000 units CHEW Chew 4,000 Units by mouth daily.   Yes [provider]  levothyroxine (SYNTHROID) 50 MCG tablet TAKE ONE (1) TABLET BY MOUTH EVERY DAY 05/26/22  Yes Dettinger, Fransisca Kaufmann, MD  loperamide (IMODIUM) 2 MG capsule Take 1 capsule (2 mg total) by mouth 4 (four) times daily as needed for diarrhea or loose stools. 03/27/22  Yes Kathie Dike, MD  metoprolol tartrate (LOPRESSOR) 25 MG tablet Take 1 tablet (25 mg  total) by mouth 2 (two) times daily as needed. Patient taking differently: Take 25 mg by mouth 2 (two) times daily as needed (irregular heart rate). 07/27/21  Yes Dettinger, Fransisca Kaufmann, MD  omeprazole (PRILOSEC) 40 MG capsule Take 1 capsule (40 mg total) by mouth daily. 07/27/21  Yes Dettinger, Fransisca Kaufmann, MD  oxazepam (SERAX) 15 MG capsule Take 1 capsule (15 mg  total) by mouth 3 (three) times daily as needed for sleep. Cancel any other prescriptions on file 05/27/22  Yes Dettinger, Fransisca Kaufmann, MD  polyethylene glycol (MIRALAX / GLYCOLAX) packet Take 17 g by mouth daily as needed for moderate constipation.    Yes [provider]  potassium chloride 20 MEQ/15ML (10%) SOLN TAKE 7.5ML (10MEQ) BY MOUTH DAILY AS NEEDED FOR CRAMPING Patient taking differently: Take 10 mEq by mouth daily as needed (cramping). 03/26/22  Yes Dettinger, Fransisca Kaufmann, MD  sucralfate (CARAFATE) 1 g tablet Take 1 tablet (1 g total) by mouth 2 (two) times daily. 05/12/22  Yes Dettinger, Fransisca Kaufmann, MD  triamcinolone ointment (KENALOG) 0.5 % APPLY TO THE AFFECTED AREA(S) TOPICALLY TWICE DAILY 05/12/22  Yes Dettinger, Fransisca Kaufmann, MD  furosemide (LASIX) 20 MG tablet Take 1 tablet (20 mg total) by mouth daily. 05/12/22   Dettinger, Fransisca Kaufmann, MD  mirtazapine (REMERON SOL-TAB) 15 MG disintegrating tablet Take 1 tablet (15 mg total) by mouth at bedtime. Patient not taking: Reported on 06/03/2022 05/12/22   Dettinger, Fransisca Kaufmann, MD  Misc. Devices (COMMODE BEDSIDE) MISC Bedside commode due to tibial fracture 07/17/20   Triplett, Tammy, PA-C  Misc. Devices [facility]) MISC Pt needs wheelchair due to extremity fractures 07/17/20   Triplett, Tammy, PA-C  Misc. Devices MISC Use rolling walker to ambulate around the house as needed. 07/29/20   Mordecai Rasmussen, MD  mometasone (NASONEX) 50 MCG/ACT nasal spray Place 2 sprays into the nose daily. Patient not taking: Reported on 06/03/2022 03/09/22   Ivy Lynn, NP  [facility] injection  11/27/21   [provider]                                                                                                                                    Past Surgical History Past Surgical History:  Procedure Laterality Date   ABDOMINAL HYSTERECTOMY     APPENDECTOMY     BREAST BIOPSY     bil cysts   CATARACT EXTRACTION     ESOPHAGOGASTRODUODENOSCOPY N/A 01/19/2018    Procedure: ESOPHAGOGASTRODUODENOSCOPY (EGD);  Surgeon: Clarene Essex, MD;  Location: [facility];  Service: Endoscopy;  Laterality: N/A;  with flouro   EYE SURGERY     HAND SURGERY Left    IR GENERIC HISTORICAL  12/10/2016   IR RADIOLOGIST EVAL & MGMT 12/10/2016 [facility]   IR GENERIC HISTORICAL  12/23/2016   IR KYPHO THORACIC WITH BONE BIOPSY 12/23/2016 Luanne Bras, MD [facility]   PARTIAL HYSTERECTOMY  THYROID LOBECTOMY     right   VERTEBROPLASTY     Family History Family History  Problem Relation Age of Onset   Goiter Father    Alcohol abuse Father    Heart disease Sister    Cancer Sister        breast and skin   Goiter Brother    Dementia Brother    Hyperlipidemia Brother    Hernia Brother    Cancer Brother        throat   Cancer Brother        neck   Heart disease Brother    Early death Brother        auto accident    Social History Social History   Tobacco Use   Smoking status: Former    Types: Cigarettes    Quit date: 11/18/2005    Years since quitting: 16.5   Smokeless tobacco: Never   Tobacco comments:    Non-smoker  quit 12-13 years  Vaping Use   Vaping Use: Never used  Substance Use Topics   Alcohol use: No   Drug use: No   Allergies Bisphosphonates, Ciprofloxacin, Codeine, and Sulfa antibiotics  Review of Systems Review of Systems  Respiratory:  Positive for shortness of breath.   Cardiovascular:  Positive for palpitations.    Physical Exam Vital Signs  I have reviewed the triage vital signs BP (!) 124/42   Pulse 92   Temp 97.6 F (36.4 C)   Resp (!) 27   Ht '5\' 1"'$  (1.549 m)   Wt 48.8 kg   SpO2 96%   BMI 20.33 kg/m   Physical Exam Vitals and nursing note reviewed.  Constitutional:      General: She is not in acute distress.    Appearance: She is well-developed.  HENT:     Head: Normocephalic and atraumatic.  Eyes:     Conjunctiva/sclera: Conjunctivae normal.  Cardiovascular:     Rate and Rhythm: Tachycardia  present. Rhythm irregular.     Heart sounds: No murmur heard. Pulmonary:     Effort: Pulmonary effort is normal. No respiratory distress.     Breath sounds: Normal breath sounds.  Abdominal:     Palpations: Abdomen is soft.     Tenderness: There is no abdominal tenderness.  Musculoskeletal:        General: No swelling.     Cervical back: Neck supple.  Skin:    General: Skin is warm and dry.     Capillary Refill: Capillary refill takes less than 2 seconds.  Neurological:     Mental Status: She is alert.  Psychiatric:        Mood and Affect: Mood normal.     ED Results and Treatments Labs (all labs ordered are listed, but only abnormal results are displayed) Labs Reviewed  CBC - Abnormal; Notable for the following components:      Result Value   Hemoglobin 11.8 (*)    All other components within normal limits  TSH - Abnormal; Notable for the following components:   TSH 0.031 (*)    All other components within normal limits  COMPREHENSIVE METABOLIC PANEL - Abnormal; Notable for the following components:   Glucose, Bld 112 (*)    Creatinine, Ser 1.13 (*)    Calcium 8.8 (*)    GFR, Estimated 48 (*)    All other components within normal limits  T4, FREE - Abnormal; Notable for the following components:   Free T4 1.43 (*)  All other components within normal limits  COMPREHENSIVE METABOLIC PANEL - Abnormal; Notable for the following components:   Glucose, Bld 113 (*)    Total Protein 6.4 (*)    Albumin 3.1 (*)    All other components within normal limits  MAGNESIUM - Abnormal; Notable for the following components:   Magnesium 1.6 (*)    All other components within normal limits  CBC WITH DIFFERENTIAL/PLATELET - Abnormal; Notable for the following components:   RBC 3.56 (*)    Hemoglobin 10.7 (*)    HCT 33.9 (*)    All other components within normal limits  URINALYSIS, COMPLETE (UACMP) WITH MICROSCOPIC - Abnormal; Notable for the following components:   Protein, ur 30  (*)    Bacteria, UA RARE (*)    All other components within normal limits  MRSA NEXT GEN BY PCR, NASAL  T3, FREE  T4  TROPONIN I (HIGH SENSITIVITY)  TROPONIN I (HIGH SENSITIVITY)                                                                                                                          Radiology DG Chest Portable 1 View  Result Date: 06/03/2022 CLINICAL DATA:  Heart palpitations and dyspnea with exertion EXAM: PORTABLE CHEST 1 VIEW COMPARISON:  Radiographs 03/26/2022 FINDINGS: Stable enlargement cardiomediastinal silhouette given patient rotation. No focal consolidation, pleural effusion, or pneumothorax. Bibasilar and biapical scarring. Interstitial thickening in the right upper lung without discrete consolidation. No acute osseous abnormality. Demineralization. Aortic calcification. Surgical clips right neck. Midthoracic spine vertebroplasty. IMPRESSION: No acute cardiopulmonary process. Aortic Atherosclerosis (ICD10-I70.0) and Emphysema (ICD10-J43.9). Electronically Signed   By: Placido Sou M.D.   On: 06/03/2022 20:01    Pertinent labs & imaging results that were available during my care of the patient were reviewed by me and considered in my medical decision making (see MDM for details).  Medications Ordered in ED Medications  metoprolol tartrate (LOPRESSOR) tablet 25 mg (has no administration in time range)  LORazepam (ATIVAN) tablet 1 mg (has no administration in time range)  pantoprazole (PROTONIX) EC tablet 40 mg (40 mg Oral Given 06/04/22 0946)  polyethylene glycol (MIRALAX / GLYCOLAX) packet 17 g (has no administration in time range)  apixaban (ELIQUIS) tablet 2.5 mg (2.5 mg Oral Given 06/04/22 0946)  acetaminophen (TYLENOL) tablet 650 mg (has no administration in time range)    Or  acetaminophen (TYLENOL) suppository 650 mg (has no administration in time range)  oxyCODONE (Oxy IR/ROXICODONE) immediate release tablet 5 mg (has no administration in time range)   ondansetron (ZOFRAN) tablet 4 mg (has no administration in time range)    Or  ondansetron (ZOFRAN) injection 4 mg (has no administration in time range)  Chlorhexidine Gluconate Cloth 2 % PADS 6 each (6 each Topical Given 06/04/22 0919)  diltiazem (CARDIZEM) tablet 30 mg (30 mg Oral Given 06/04/22 0946)  lactated ringers bolus 1,000 mL (0 mLs Intravenous Stopped 06/03/22 2118)  diltiazem (CARDIZEM) injection 10 mg (  10 mg Intravenous Given 06/03/22 2226)  magnesium sulfate IVPB 2 g 50 mL (2 g Intravenous New Bag/Given 06/04/22 0958)                                                                                                                                     Procedures .Critical Care  Performed by: Teressa Lower, MD Authorized by: Teressa Lower, MD   Critical care provider statement:    Critical care time (minutes):  30   Critical care was necessary to treat or prevent imminent or life-threatening deterioration of the following conditions:  Cardiac failure   Critical care was time spent personally by me on the following activities:  Development of treatment plan with patient or surrogate, discussions with consultants, evaluation of patient's response to treatment, examination of patient, ordering and review of laboratory studies, ordering and review of radiographic studies, ordering and performing treatments and interventions, pulse oximetry, re-evaluation of patient's condition and review of old charts   (including critical care time)  Medical Decision Making / ED Course   This patient presents to the ED for concern of rapid heart rate, palpitations, this involves an extensive number of treatment options, and is a complaint that carries with it a high risk of complications and morbidity.  The differential diagnosis includes A-fib with RVR, electrolyte abnormality, dehydration  MDM: Patient seen emergency department for evaluation of rapid heart rate and shortness of breath.  Physical  exam reveals a rapid irregular tachycardia but is otherwise unremarkable.  Laboratory evaluation with a hemoglobin of 11.8, TSH low at 0.031 with an elevated T4-1 0.43, urinalysis unremarkable, chemistry unremarkable.  Chest x-ray with no acute disease.  I called the cardiologist on-call who states that given patient's severe aortic insufficiency and mitral stenosis, her Eliquis may not be adequate to protect from blood clots in the heart and that she would be a poor candidate for bedside ER cardioversion.  He recommended initiation of diltiazem load and infusion.  Patient started on a Cardene drip and admitted.   Additional history obtained: -Additional history obtained from husband -External records from outside source obtained and reviewed including: Chart review including previous notes, labs, imaging, consultation notes   Lab Tests: -I ordered, reviewed, and interpreted labs.   The pertinent results include:   Labs Reviewed  CBC - Abnormal; Notable for the following components:      Result Value   Hemoglobin 11.8 (*)    All other components within normal limits  TSH - Abnormal; Notable for the following components:   TSH 0.031 (*)    All other components within normal limits  COMPREHENSIVE METABOLIC PANEL - Abnormal; Notable for the following components:   Glucose, Bld 112 (*)    Creatinine, Ser 1.13 (*)    Calcium 8.8 (*)    GFR, Estimated 48 (*)    All other components within normal limits  T4, FREE - Abnormal; Notable  for the following components:   Free T4 1.43 (*)    All other components within normal limits  COMPREHENSIVE METABOLIC PANEL - Abnormal; Notable for the following components:   Glucose, Bld 113 (*)    Total Protein 6.4 (*)    Albumin 3.1 (*)    All other components within normal limits  MAGNESIUM - Abnormal; Notable for the following components:   Magnesium 1.6 (*)    All other components within normal limits  CBC WITH DIFFERENTIAL/PLATELET - Abnormal;  Notable for the following components:   RBC 3.56 (*)    Hemoglobin 10.7 (*)    HCT 33.9 (*)    All other components within normal limits  URINALYSIS, COMPLETE (UACMP) WITH MICROSCOPIC - Abnormal; Notable for the following components:   Protein, ur 30 (*)    Bacteria, UA RARE (*)    All other components within normal limits  MRSA NEXT GEN BY PCR, NASAL  T3, FREE  T4  TROPONIN I (HIGH SENSITIVITY)  TROPONIN I (HIGH SENSITIVITY)      EKG   EKG Interpretation  Date/Time:    Ventricular Rate:    PR Interval:    QRS Duration:   QT Interval:    QTC Calculation:   R Axis:     Text Interpretation:           Imaging Studies ordered: I ordered imaging studies including chest x-ray I independently visualized and interpreted imaging. I agree with the radiologist interpretation   Medicines ordered and prescription drug management: Meds ordered this encounter  Medications   lactated ringers bolus 1,000 mL   diltiazem (CARDIZEM) injection 10 mg   DISCONTD: diltiazem (CARDIZEM) 125 mg in dextrose 5% 125 mL (1 mg/mL) infusion   metoprolol tartrate (LOPRESSOR) tablet 25 mg   DISCONTD: mirtazapine (REMERON SOL-TAB) disintegrating tablet 15 mg   LORazepam (ATIVAN) tablet 1 mg   DISCONTD: levothyroxine (SYNTHROID) tablet 25 mcg   pantoprazole (PROTONIX) EC tablet 40 mg   polyethylene glycol (MIRALAX / GLYCOLAX) packet 17 g   apixaban (ELIQUIS) tablet 2.5 mg   OR Linked Order Group    acetaminophen (TYLENOL) tablet 650 mg    acetaminophen (TYLENOL) suppository 650 mg   oxyCODONE (Oxy IR/ROXICODONE) immediate release tablet 5 mg   OR Linked Order Group    ondansetron (ZOFRAN) tablet 4 mg    ondansetron (ZOFRAN) injection 4 mg   magnesium sulfate IVPB 2 g 50 mL   Chlorhexidine Gluconate Cloth 2 % PADS 6 each   diltiazem (CARDIZEM) tablet 30 mg    -I have reviewed the patients home medicines and have made adjustments as needed  Critical interventions Diltiazem infusion and  bolus  Consultations Obtained: I requested consultation with the cardiologist,  and discussed lab and imaging findings as well as pertinent plan - they recommend: Admission on Dilt drip   Cardiac Monitoring: The patient was maintained on a cardiac monitor.  I personally viewed and interpreted the cardiac monitored which showed an underlying rhythm of: A-fib with RVR  Social Determinants of Health:  Factors impacting patients care include: none   Reevaluation: After the interventions noted above, I reevaluated the patient and found that they have :improved  Co morbidities that complicate the patient evaluation  Past Medical History:  Diagnosis Date   A-fib [facility])    Anxiety states    Aortic regurgitation 05/24/2022   Atrial fibrillation ([place])    Breast cyst    Cancer ([place])    skin   Cataract  Chronic diastolic heart failure (HCC) 05/24/2022   Constipation    Esophageal stricture    Essential hypertension 12/18/2015   Fracture, rib    GERD (gastroesophageal reflux disease)    Hiatal hernia    Lower extremity edema 12/18/2015   Mitral stenosis 05/24/2022   Osteoporosis    Other and unspecified hyperlipidemia    PAC (premature atrial contraction) 12/30/2015   Palpitations 12/18/2015   Unspecified hypothyroidism    Vertebral fracture, osteoporotic ([place])       Dispostion: I considered admission for this patient, and due to A-fib with RVR on the diltiazem drip, patient require hospital admission     Final Clinical Impression(s) / ED Diagnoses Final diagnoses:  None     '@PCDICTATION'$ @    Teressa Lower, MD 06/04/22 1228

## 2022-06-04 NOTE — Assessment & Plan Note (Signed)
Continue Protonix °

## 2022-06-04 NOTE — ED Notes (Signed)
ED TO INPATIENT HANDOFF REPORT  ED Nurse Name and Phone #:  Fabio Neighbors RN  S Name/Age/Gender [name] 81 y.o. female Room/Bed: APA12/APA12  Code Status   Code Status: Full Code  Home/SNF/Other Home Patient oriented to: self, place, time, and situation Is this baseline? Yes   Triage Complete: Triage complete  Chief Complaint Atrial fibrillation with RVR ([place]) [I48.91]  Triage Note Pt presents to ED, sent from urgent care with spouse with c/o heart palpatations, some SOB with exertion, and chest tightness earlier, pt has hx of A-fib, is on eliquis and metoprolol, pt has taken meds today, took last dose of metoprolol between 5-6 pm today. Chest tightness is resolved per pt.    Allergies Allergies  Allergen Reactions   Bisphosphonates Other (See Comments)    Difficulty swallowing    Ciprofloxacin Nausea And Vomiting   Codeine Nausea And Vomiting   Sulfa Antibiotics Nausea And Vomiting    Level of Care/Admitting Diagnosis ED Disposition     ED Disposition  Admit   Condition  --   [place]: [facility] [[id number]]  Level of Care: Stepdown [14]  Covid Evaluation: Asymptomatic - no recent exposure (last 10 days) testing not required  Diagnosis: Atrial fibrillation with RVR [facility]) [[id number]]  Admitting Physician: Rolla Plate [[id number]]  Attending Physician: Rolla Plate [[id number]]          B Medical/Surgery History Past Medical History:  Diagnosis Date   A-fib ([place])    Anxiety states    Aortic regurgitation 05/24/2022   Atrial fibrillation (HCC)    Breast cyst    Cancer (HCC)    skin   Cataract    Chronic diastolic heart failure ([place]) 05/24/2022   Constipation    Esophageal stricture    Essential hypertension 12/18/2015   Fracture, rib    GERD (gastroesophageal reflux disease)    Hiatal hernia    Lower extremity edema 12/18/2015   Mitral stenosis 05/24/2022   Osteoporosis    Other and unspecified  hyperlipidemia    PAC (premature atrial contraction) 12/30/2015   Palpitations 12/18/2015   Unspecified hypothyroidism    Vertebral fracture, osteoporotic ([place])    Past Surgical History:  Procedure Laterality Date   ABDOMINAL HYSTERECTOMY     APPENDECTOMY     BREAST BIOPSY     bil cysts   CATARACT EXTRACTION     ESOPHAGOGASTRODUODENOSCOPY N/A 01/19/2018   Procedure: ESOPHAGOGASTRODUODENOSCOPY (EGD);  Surgeon: Clarene Essex, MD;  Location: [facility];  Service: Endoscopy;  Laterality: N/A;  with flouro   EYE SURGERY     HAND SURGERY Left    IR GENERIC HISTORICAL  12/10/2016   IR RADIOLOGIST EVAL & MGMT 12/10/2016 [facility]   IR GENERIC HISTORICAL  12/23/2016   IR KYPHO THORACIC WITH BONE BIOPSY 12/23/2016 Luanne Bras, MD [facility]   PARTIAL HYSTERECTOMY     THYROID LOBECTOMY     right   VERTEBROPLASTY       A IV Location/Drains/Wounds Patient Lines/Drains/Airways Status     Active Line/Drains/Airways     Name Placement date Placement time Site Days   Peripheral IV 06/03/22 20 G 1" Right Antecubital 06/03/22  1949  Antecubital  1            Intake/Output Last 24 hours  Intake/Output Summary (Last 24 hours) at 06/04/2022 0751 Last data filed at 06/03/2022 2118 Gross per 24 hour  Intake 1000 ml  Output --  Net 1000 ml  Labs/Imaging Results for orders placed or performed during the hospital encounter of 06/03/22 (from the past 48 hour(s))  CBC     Status: Abnormal   Collection Time: 06/03/22  7:50 PM  Result Value Ref Range   WBC 8.0 4.0 - 10.5 K/uL   RBC 3.96 3.87 - 5.11 MIL/uL   Hemoglobin 11.8 (L) 12.0 - 15.0 g/dL   HCT 37.7 36.0 - 46.0 %   MCV 95.2 80.0 - 100.0 fL   MCH 29.8 26.0 - 34.0 pg   MCHC 31.3 30.0 - 36.0 g/dL   RDW 14.9 11.5 - 15.5 %   Platelets 266 150 - 400 K/uL   nRBC 0.0 0.0 - 0.2 %    Comment: Performed at [facility], [address] [place], [place] [address]  Troponin I (High Sensitivity)     Status: None   Collection Time:  06/03/22  7:50 PM  Result Value Ref Range   Troponin I (High Sensitivity) 6 <18 ng/L    Comment: (NOTE) Elevated high sensitivity troponin I (hsTnI) values and significant  changes across serial measurements may suggest ACS but many other  chronic and acute conditions are known to elevate hsTnI results.  Refer to the "Links" section for chest pain algorithms and additional  guidance. Performed at [facility] [address], [place], [place] [address]   TSH     Status: Abnormal   Collection Time: 06/03/22  8:04 PM  Result Value Ref Range   TSH 0.031 (L) 0.350 - 4.500 uIU/mL    Comment: Performed by a 3rd Generation assay with a functional sensitivity of <=0.01 uIU/mL. Performed at [facility], [address] [place], [place] [address]   Comprehensive metabolic panel     Status: Abnormal   Collection Time: 06/03/22  8:08 PM  Result Value Ref Range   Sodium 140 135 - 145 mmol/L   Potassium 4.2 3.5 - 5.1 mmol/L   Chloride 106 98 - 111 mmol/L   CO2 28 22 - 32 mmol/L   Glucose, Bld 112 (H) 70 - 99 mg/dL    Comment: Glucose reference range applies only to samples taken after fasting for at least 8 hours.   BUN 14 8 - 23 mg/dL   Creatinine, Ser 1.13 (H) 0.44 - 1.00 mg/dL   Calcium 8.8 (L) 8.9 - 10.3 mg/dL   Total Protein 7.2 6.5 - 8.1 g/dL   Albumin 3.5 3.5 - 5.0 g/dL   AST 19 15 - 41 U/L   ALT 11 0 - 44 U/L   Alkaline Phosphatase 99 38 - 126 U/L   Total Bilirubin 0.6 0.3 - 1.2 mg/dL   GFR, Estimated 48 (L) >60 mL/min    Comment: (NOTE) Calculated using the CKD-EPI Creatinine Equation (2021)    Anion gap 6 5 - 15    Comment: Performed at [facility], [address] [place], [place] [address]  Troponin I (High Sensitivity)     Status: None   Collection Time: 06/03/22  9:34 PM  Result Value Ref Range   Troponin I (High Sensitivity) 7 <18 ng/L    Comment: (NOTE) Elevated high sensitivity troponin I (hsTnI) values and significant  changes across serial measurements may suggest  ACS but many other  chronic and acute conditions are known to elevate hsTnI results.  Refer to the "Links" section for chest pain algorithms and additional  guidance. Performed at [facility], [address] [place], [place] [address]   Urinalysis, Complete w Microscopic  Status: Abnormal   Collection Time: 06/04/22  3:19 AM  Result Value Ref Range   Color, Urine YELLOW YELLOW   APPearance CLEAR CLEAR   Specific Gravity, Urine 1.016 1.005 - 1.030   pH 6.0 5.0 - 8.0   Glucose, UA NEGATIVE NEGATIVE mg/dL   Hgb urine dipstick NEGATIVE NEGATIVE   Bilirubin Urine NEGATIVE NEGATIVE   Ketones, ur NEGATIVE NEGATIVE mg/dL   Protein, ur 30 (A) NEGATIVE mg/dL   Nitrite NEGATIVE NEGATIVE   Leukocytes,Ua NEGATIVE NEGATIVE   RBC / HPF 0-5 0 - 5 RBC/hpf   WBC, UA 0-5 0 - 5 WBC/hpf   Bacteria, UA RARE (A) NONE SEEN   Squamous Epithelial / LPF 0-5 0 - 5   Mucus PRESENT     Comment: Performed at [facility], [address], [place], [place] [address]  Comprehensive metabolic panel     Status: Abnormal   Collection Time: 06/04/22  5:31 AM  Result Value Ref Range   Sodium 141 135 - 145 mmol/L   Potassium 4.4 3.5 - 5.1 mmol/L   Chloride 108 98 - 111 mmol/L   CO2 28 22 - 32 mmol/L   Glucose, Bld 113 (H) 70 - 99 mg/dL    Comment: Glucose reference range applies only to samples taken after fasting for at least 8 hours.   BUN 13 8 - 23 mg/dL   Creatinine, Ser 0.94 0.44 - 1.00 mg/dL   Calcium 8.9 8.9 - 10.3 mg/dL   Total Protein 6.4 (L) 6.5 - 8.1 g/dL   Albumin 3.1 (L) 3.5 - 5.0 g/dL   AST 23 15 - 41 U/L   ALT 12 0 - 44 U/L   Alkaline Phosphatase 88 38 - 126 U/L   Total Bilirubin 0.7 0.3 - 1.2 mg/dL   GFR, Estimated >60 >60 mL/min    Comment: (NOTE) Calculated using the CKD-EPI Creatinine Equation (2021)    Anion gap 5 5 - 15    Comment: Performed at [facility], [address], [place], [place] [address]  Magnesium     Status: Abnormal   Collection Time: 06/04/22  5:31 AM  Result Value  Ref Range   Magnesium 1.6 (L) 1.7 - 2.4 mg/dL    Comment: Performed at [facility], [address], [place], [place] [address]  CBC with Differential/Platelet     Status: Abnormal   Collection Time: 06/04/22  5:31 AM  Result Value Ref Range   WBC 8.9 4.0 - 10.5 K/uL   RBC 3.56 (L) 3.87 - 5.11 MIL/uL   Hemoglobin 10.7 (L) 12.0 - 15.0 g/dL   HCT 33.9 (L) 36.0 - 46.0 %   MCV 95.2 80.0 - 100.0 fL   MCH 30.1 26.0 - 34.0 pg   MCHC 31.6 30.0 - 36.0 g/dL   RDW 14.9 11.5 - 15.5 %   Platelets 222 150 - 400 K/uL   nRBC 0.0 0.0 - 0.2 %   Neutrophils Relative % 68 %   Neutro Abs 6.2 1.7 - 7.7 K/uL   Lymphocytes Relative 19 %   Lymphs Abs 1.7 0.7 - 4.0 K/uL   Monocytes Relative 8 %   Monocytes Absolute 0.7 0.1 - 1.0 K/uL   Eosinophils Relative 3 %   Eosinophils Absolute 0.2 0.0 - 0.5 K/uL   Basophils Relative 1 %   Basophils Absolute 0.1 0.0 - 0.1 K/uL   Immature Granulocytes 1 %   Abs Immature Granulocytes 0.04 0.00 - 0.07 K/uL    Comment: Performed at [employer] [facility], [address] [place], [place] [address]   DG Chest Portable 1 View  Result Date: 06/03/2022 CLINICAL DATA:  Heart palpitations and dyspnea with exertion EXAM: PORTABLE CHEST 1 VIEW COMPARISON:  Radiographs 03/26/2022 FINDINGS: Stable enlargement cardiomediastinal silhouette given patient rotation. No focal consolidation, pleural effusion, or pneumothorax. Bibasilar and biapical scarring. Interstitial thickening in the right upper lung without discrete consolidation. No acute osseous abnormality. Demineralization. Aortic calcification. Surgical clips right neck. Midthoracic spine vertebroplasty. IMPRESSION: No acute cardiopulmonary process. Aortic Atherosclerosis (ICD10-I70.0) and Emphysema (ICD10-J43.9). Electronically Signed   By: Placido Sou M.D.   On: 06/03/2022 20:01    Pending Labs Unresulted Labs (From admission, onward)     Start     Ordered   06/04/22 0500  T4  Tomorrow morning,   R        06/04/22 0142   06/03/22  2056  T3, free  Once,   URGENT        06/03/22 2055   06/03/22 2056  T4, free  Once,   URGENT        06/03/22 2055            Vitals/Pain Today's Vitals   06/04/22 0630 06/04/22 0700 06/04/22 0704 06/04/22 0740  BP: (!) 87/47 (!) 94/48  (!) 100/55  Pulse: 75 68  65  Resp: (!) 23 (!) 21  (!) 21  Temp:    97.8 F (36.6 C)  TempSrc:    Oral  SpO2:  92%  93%  Weight:      Height:      PainSc:   Asleep     Isolation Precautions No active isolations  Medications Medications  diltiazem (CARDIZEM) 125 mg in dextrose 5% 125 mL (1 mg/mL) infusion (15 mg/hr Intravenous Rate/Dose Change 06/04/22 0323)  metoprolol tartrate (LOPRESSOR) tablet 25 mg (has no administration in time range)  LORazepam (ATIVAN) tablet 1 mg (has no administration in time range)  pantoprazole (PROTONIX) EC tablet 40 mg (has no administration in time range)  polyethylene glycol (MIRALAX / GLYCOLAX) packet 17 g (has no administration in time range)  apixaban (ELIQUIS) tablet 2.5 mg (2.5 mg Oral Given 06/04/22 0214)  acetaminophen (TYLENOL) tablet 650 mg (has no administration in time range)    Or  acetaminophen (TYLENOL) suppository 650 mg (has no administration in time range)  oxyCODONE (Oxy IR/ROXICODONE) immediate release tablet 5 mg (has no administration in time range)  ondansetron (ZOFRAN) tablet 4 mg (has no administration in time range)    Or  ondansetron (ZOFRAN) injection 4 mg (has no administration in time range)  magnesium sulfate IVPB 2 g 50 mL (has no administration in time range)  lactated ringers bolus 1,000 mL (0 mLs Intravenous Stopped 06/03/22 2118)  diltiazem (CARDIZEM) injection 10 mg (10 mg Intravenous Given 06/03/22 2226)    Mobility walks Low fall risk   Focused Assessments    R Recommendations: See Admitting Provider Note  Report given to:  Numera RN  Additional Notes:

## 2022-06-04 NOTE — H&P (Signed)
History and Physical    Patient: [name] IRC:[id number] DOB: 1941-02-17 DOA: 06/03/2022 DOS: the patient was seen and examined on 06/04/2022 PCP: Dettinger, Fransisca Kaufmann, MD  Patient coming from: Home  Chief Complaint:  Chief Complaint  Patient presents with   Shortness of Breath    Fast heart rate Hx -A-fib   HPI: [name] is a 81 y.o. female with medical history significant of atrial fibrillation, aortic regurgitation, chronic diastolic heart failure, GERD, mitral stenosis, and more presents to the ER with a chief complaint of chest tightness.  Patient is a poor historian.  After she says she has palpitations, then she reports she never felt her heart racing she just knew it was high because she checked her pulse ox.  Since she was checking her pulse ox she was asked if she was short of breath and at first she said yes and then when you ask her how long she was short of breath she said she never felt short of breath she only felt chest tightness.  Is almost impossible to get a timeline as she wants to explain every symptom from the first time she ever felt in her life and set up for this episode.  From what could be pieced together she started having chest tightness yesterday.  She had felt fatigued all throughout the day.  She reports she has felt fatigue, and not like herself since she was last hospitalized in the early summer.  She also reports peripheral edema, but again timeline could not be specified.  She had a decreased appetite and lost 101 pounds.  The weight loss started with a diarrheal illness, and an unknown time.  Her appetite is coming back, but it is unclear if her weight is stabilized.  She has no other symptoms.  No infectious symptoms.  She has not missed any doses of her metoprolol.  She is compliant with her Eliquis.  She has no other complaints at this time.  Patient does not smoke, does not drink, does not use illicit drugs.  She is vaccinated for COVID. Review  of Systems: As mentioned in the history of present illness. All other systems reviewed and are negative. Past Medical History:  Diagnosis Date   A-fib [facility])    Anxiety states    Aortic regurgitation 05/24/2022   Atrial fibrillation (HCC)    Breast cyst    Cancer (HCC)    skin   Cataract    Chronic diastolic heart failure ([place]) 05/24/2022   Constipation    Esophageal stricture    Essential hypertension 12/18/2015   Fracture, rib    GERD (gastroesophageal reflux disease)    Hiatal hernia    Lower extremity edema 12/18/2015   Mitral stenosis 05/24/2022   Osteoporosis    Other and unspecified hyperlipidemia    PAC (premature atrial contraction) 12/30/2015   Palpitations 12/18/2015   Unspecified hypothyroidism    Vertebral fracture, osteoporotic ([place])    Past Surgical History:  Procedure Laterality Date   ABDOMINAL HYSTERECTOMY     APPENDECTOMY     BREAST BIOPSY     bil cysts   CATARACT EXTRACTION     ESOPHAGOGASTRODUODENOSCOPY N/A 01/19/2018   Procedure: ESOPHAGOGASTRODUODENOSCOPY (EGD);  Surgeon: Clarene Essex, MD;  Location: [facility];  Service: Endoscopy;  Laterality: N/A;  with flouro   EYE SURGERY     HAND SURGERY Left    IR GENERIC HISTORICAL  12/10/2016   IR RADIOLOGIST EVAL & MGMT 12/10/2016 [facility]  IR GENERIC HISTORICAL  12/23/2016   IR KYPHO THORACIC WITH BONE BIOPSY 12/23/2016 Luanne Bras, MD [facility]   PARTIAL HYSTERECTOMY     THYROID LOBECTOMY     right   VERTEBROPLASTY     Social History:  reports that she quit smoking about 16 years ago. Her smoking use included cigarettes. She has never used smokeless tobacco. She reports that she does not drink alcohol and does not use drugs.  Allergies  Allergen Reactions   Bisphosphonates Other (See Comments)    Difficulty swallowing    Ciprofloxacin Nausea And Vomiting   Codeine Nausea And Vomiting   Sulfa Antibiotics Nausea And Vomiting    Family History  Problem Relation Age of Onset   Goiter  Father    Alcohol abuse Father    Heart disease Sister    Cancer Sister        breast and skin   Goiter Brother    Dementia Brother    Hyperlipidemia Brother    Hernia Brother    Cancer Brother        throat   Cancer Brother        neck   Heart disease Brother    Early death Brother        auto accident    Prior to Admission medications   Medication Sig Start Date End Date Taking? Authorizing Provider  acetaminophen (TYLENOL) 500 MG tablet Take 500 mg by mouth every 8 (eight) hours as needed for mild pain or moderate pain.   Yes [provider]  albuterol (VENTOLIN HFA) 108 (90 Base) MCG/ACT inhaler Inhale 2 puffs into the lungs every 6 (six) hours as needed for wheezing or shortness of breath. 12/10/21  Yes Dettinger, Fransisca Kaufmann, MD  apixaban (ELIQUIS) 2.5 MG TABS tablet Take 1 tablet (2.5 mg total) by mouth 2 (two) times daily. 04/08/22  Yes Dettinger, Fransisca Kaufmann, MD  Cholecalciferol (VITAMIN D3) 2000 units CHEW Chew 4,000 Units by mouth daily.   Yes [provider]  levothyroxine (SYNTHROID) 50 MCG tablet TAKE ONE (1) TABLET BY MOUTH EVERY DAY 05/26/22  Yes Dettinger, Fransisca Kaufmann, MD  loperamide (IMODIUM) 2 MG capsule Take 1 capsule (2 mg total) by mouth 4 (four) times daily as needed for diarrhea or loose stools. 03/27/22  Yes Kathie Dike, MD  metoprolol tartrate (LOPRESSOR) 25 MG tablet Take 1 tablet (25 mg total) by mouth 2 (two) times daily as needed. Patient taking differently: Take 25 mg by mouth 2 (two) times daily as needed (irregular heart rate). 07/27/21  Yes Dettinger, Fransisca Kaufmann, MD  omeprazole (PRILOSEC) 40 MG capsule Take 1 capsule (40 mg total) by mouth daily. 07/27/21  Yes Dettinger, Fransisca Kaufmann, MD  oxazepam (SERAX) 15 MG capsule Take 1 capsule (15 mg total) by mouth 3 (three) times daily as needed for sleep. Cancel any other prescriptions on file 05/27/22  Yes Dettinger, Fransisca Kaufmann, MD  polyethylene glycol (MIRALAX / GLYCOLAX) packet Take 17 g by mouth daily as  needed for moderate constipation.    Yes [provider]  potassium chloride 20 MEQ/15ML (10%) SOLN TAKE 7.5ML (10MEQ) BY MOUTH DAILY AS NEEDED FOR CRAMPING Patient taking differently: Take 10 mEq by mouth daily as needed (cramping). 03/26/22  Yes Dettinger, Fransisca Kaufmann, MD  sucralfate (CARAFATE) 1 g tablet Take 1 tablet (1 g total) by mouth 2 (two) times daily. 05/12/22  Yes Dettinger, Fransisca Kaufmann, MD  triamcinolone ointment (KENALOG) 0.5 % APPLY TO THE AFFECTED AREA(S) TOPICALLY TWICE  DAILY 05/12/22  Yes Dettinger, Fransisca Kaufmann, MD  furosemide (LASIX) 20 MG tablet Take 1 tablet (20 mg total) by mouth daily. 05/12/22   Dettinger, Fransisca Kaufmann, MD  mirtazapine (REMERON SOL-TAB) 15 MG disintegrating tablet Take 1 tablet (15 mg total) by mouth at bedtime. Patient not taking: Reported on 06/03/2022 05/12/22   Dettinger, Fransisca Kaufmann, MD  Misc. Devices (COMMODE BEDSIDE) MISC Bedside commode due to tibial fracture 07/17/20   Triplett, Tammy, PA-C  Misc. Devices [facility]) MISC Pt needs wheelchair due to extremity fractures 07/17/20   Triplett, Tammy, PA-C  Misc. Devices MISC Use rolling walker to ambulate around the house as needed. 07/29/20   Mordecai Rasmussen, MD  mometasone (NASONEX) 50 MCG/ACT nasal spray Place 2 sprays into the nose daily. Patient not taking: Reported on 06/03/2022 03/09/22   Ivy Lynn, NP  [facility] injection  11/27/21   [provider]    Physical Exam: Vitals:   06/04/22 0330 06/04/22 0332 06/04/22 0500 06/04/22 0530  BP: 111/67  96/65   Pulse: (!) 33  70 71  Resp: (!) 27  (!) 27 (!) 23  Temp:  98.6 F (37 C)    TempSrc:  Oral    SpO2: 92%  94% 98%  Weight:      Height:       1.  General: Patient sitting up in bed,  no acute distress   2. Psychiatric: Alert and oriented x 3, mood and behavior normal for situation, pleasant and cooperative with exam   3. Neurologic: Speech and language are normal, face is symmetric, moves all 4 extremities voluntarily, at baseline  without acute deficits on limited exam   4. HEENMT:  Head is atraumatic, normocephalic, pupils reactive to light, neck is supple, trachea is midline, mucous membranes are moist   5. Respiratory : Lungs are clear to auscultation bilaterally without wheezing, rhonchi, rales, no cyanosis, no increase in work of breathing or accessory muscle use   6. Cardiovascular : Heart rate normal, rhythm is irregular at the time of my exam, murmur present, rubs or gallops, peripheral edema present, peripheral pulses palpated   7. Gastrointestinal:  Abdomen is soft, nondistended, nontender to palpation bowel sounds active, no masses or organomegaly palpated   8. Skin:  Skin is warm, dry and intact without rashes, acute lesions, or ulcers on limited exam   9.Musculoskeletal:  No acute deformities or trauma, no asymmetry in tone, peripheral edema present, peripheral pulses palpated, no tenderness to palpation in the extremities  Data Reviewed: In the ED Temp 98.1, heart rate 93-141, respiratory rate 23-32, blood pressure 91/71-122/91, satting 96% No leukocytosis 8.0, hemoglobin stable 11.8 Chemistry is unremarkable Calcium is a little low at 8.8 with a normal albumin of 3.5 Trope 6, 7 TSH 0.031 last T4 was 1.67 earlier this month Chest x-ray shows no acute abnormality Cardizem 10 mg IV injection given without improvement in her RVR 1 L LR given Started on dill drip and admission requested for A-fib with RVR Cardiology was consulted and recommended against cardioversion in the ER  Assessment and Plan: * Atrial fibrillation with RVR ([place]) -a fib with RVR at presentation -now rate controlled on dilt drip -Cardiology consulted from the ED and recommended against cardioversion due to history of aortic regurgitation -Consult cardiology -Continue home Eliquis and metoprolol -Continue to monitor  Essential hypertension - Continue metoprolol -BP well controlled at admission at  122/91  Hypothyroid - Continue Synthroid -Given A-fib with RVR check TSH  GERD (gastroesophageal  reflux disease) - Continue Protonix      Advance Care Planning:   Code Status: Full Code   Consults: Cardiology  Family Communication: Husband at bedside  Severity of Illness: The appropriate patient status for this patient is OBSERVATION. Observation status is judged to be reasonable and necessary in order to provide the required intensity of service to ensure the patient's safety. The patient's presenting symptoms, physical exam findings, and initial radiographic and laboratory data in the context of their medical condition is felt to place them at decreased risk for further clinical deterioration. Furthermore, it is anticipated that the patient will be medically stable for discharge from the hospital within 2 midnights of admission.   Author: Rolla Plate, DO 06/04/2022 6:30 AM  For on call review [email or web address].

## 2022-06-04 NOTE — Care Management Obs Status (Signed)
[place] NOTIFICATION   Patient Details  Name: [name] MRN: [id number] Date of Birth: 29-Mar-1941   Medicare Observation Status Notification Given:  Yes    Boneta Lucks, RN 06/04/2022, 1:29 PM

## 2022-06-05 DIAGNOSIS — I5033 Acute on chronic diastolic (congestive) heart failure: Secondary | ICD-10-CM | POA: Diagnosis present

## 2022-06-05 DIAGNOSIS — E785 Hyperlipidemia, unspecified: Secondary | ICD-10-CM | POA: Diagnosis present

## 2022-06-05 DIAGNOSIS — Z85828 Personal history of other malignant neoplasm of skin: Secondary | ICD-10-CM | POA: Diagnosis not present

## 2022-06-05 DIAGNOSIS — Z87891 Personal history of nicotine dependence: Secondary | ICD-10-CM | POA: Diagnosis not present

## 2022-06-05 DIAGNOSIS — E039 Hypothyroidism, unspecified: Secondary | ICD-10-CM | POA: Diagnosis present

## 2022-06-05 DIAGNOSIS — Z881 Allergy status to other antibiotic agents status: Secondary | ICD-10-CM | POA: Diagnosis not present

## 2022-06-05 DIAGNOSIS — I959 Hypotension, unspecified: Secondary | ICD-10-CM | POA: Diagnosis present

## 2022-06-05 DIAGNOSIS — I10 Essential (primary) hypertension: Secondary | ICD-10-CM | POA: Diagnosis not present

## 2022-06-05 DIAGNOSIS — Z888 Allergy status to other drugs, medicaments and biological substances status: Secondary | ICD-10-CM | POA: Diagnosis not present

## 2022-06-05 DIAGNOSIS — Z882 Allergy status to sulfonamides status: Secondary | ICD-10-CM | POA: Diagnosis not present

## 2022-06-05 DIAGNOSIS — F419 Anxiety disorder, unspecified: Secondary | ICD-10-CM

## 2022-06-05 DIAGNOSIS — Z79899 Other long term (current) drug therapy: Secondary | ICD-10-CM | POA: Diagnosis not present

## 2022-06-05 DIAGNOSIS — Z79890 Hormone replacement therapy (postmenopausal): Secondary | ICD-10-CM | POA: Diagnosis not present

## 2022-06-05 DIAGNOSIS — I4891 Unspecified atrial fibrillation: Secondary | ICD-10-CM | POA: Diagnosis not present

## 2022-06-05 DIAGNOSIS — Z8249 Family history of ischemic heart disease and other diseases of the circulatory system: Secondary | ICD-10-CM | POA: Diagnosis not present

## 2022-06-05 DIAGNOSIS — I110 Hypertensive heart disease with heart failure: Secondary | ICD-10-CM | POA: Diagnosis present

## 2022-06-05 DIAGNOSIS — Z885 Allergy status to narcotic agent status: Secondary | ICD-10-CM | POA: Diagnosis not present

## 2022-06-05 DIAGNOSIS — I4510 Unspecified right bundle-branch block: Secondary | ICD-10-CM | POA: Diagnosis present

## 2022-06-05 DIAGNOSIS — Z83438 Family history of other disorder of lipoprotein metabolism and other lipidemia: Secondary | ICD-10-CM | POA: Diagnosis not present

## 2022-06-05 DIAGNOSIS — I480 Paroxysmal atrial fibrillation: Secondary | ICD-10-CM | POA: Diagnosis present

## 2022-06-05 DIAGNOSIS — I083 Combined rheumatic disorders of mitral, aortic and tricuspid valves: Secondary | ICD-10-CM | POA: Diagnosis present

## 2022-06-05 DIAGNOSIS — K219 Gastro-esophageal reflux disease without esophagitis: Secondary | ICD-10-CM | POA: Diagnosis present

## 2022-06-05 DIAGNOSIS — Z809 Family history of malignant neoplasm, unspecified: Secondary | ICD-10-CM | POA: Diagnosis not present

## 2022-06-05 DIAGNOSIS — M810 Age-related osteoporosis without current pathological fracture: Secondary | ICD-10-CM | POA: Diagnosis present

## 2022-06-05 DIAGNOSIS — I471 Supraventricular tachycardia: Secondary | ICD-10-CM | POA: Diagnosis present

## 2022-06-05 DIAGNOSIS — Z7901 Long term (current) use of anticoagulants: Secondary | ICD-10-CM | POA: Diagnosis not present

## 2022-06-05 LAB — BASIC METABOLIC PANEL
Anion gap: 7 (ref 5–15)
BUN: 14 mg/dL (ref 8–23)
CO2: 27 mmol/L (ref 22–32)
Calcium: 8.7 mg/dL — ABNORMAL LOW (ref 8.9–10.3)
Chloride: 105 mmol/L (ref 98–111)
Creatinine, Ser: 1.27 mg/dL — ABNORMAL HIGH (ref 0.44–1.00)
GFR, Estimated: 42 mL/min — ABNORMAL LOW (ref >60)
Glucose, Bld: 126 mg/dL — ABNORMAL HIGH (ref 70–99)
Potassium: 4 mmol/L (ref 3.5–5.1)
Sodium: 139 mmol/L (ref 135–145)

## 2022-06-05 LAB — CBC
HCT: 32.2 % — ABNORMAL LOW (ref 36.0–46.0)
Hemoglobin: 10.1 g/dL — ABNORMAL LOW (ref 12.0–15.0)
MCH: 30.3 pg (ref 26.0–34.0)
MCHC: 31.4 g/dL (ref 30.0–36.0)
MCV: 96.7 fL (ref 80.0–100.0)
Platelets: 203 10*3/uL (ref 150–400)
RBC: 3.33 MIL/uL — ABNORMAL LOW (ref 3.87–5.11)
RDW: 14.9 % (ref 11.5–15.5)
WBC: 8.2 10*3/uL (ref 4.0–10.5)
nRBC: 0 % (ref 0.0–0.2)

## 2022-06-05 LAB — MAGNESIUM: Magnesium: 2 mg/dL (ref 1.7–2.4)

## 2022-06-05 LAB — T3, FREE: T3, Free: 3.2 pg/mL (ref 2.0–4.4)

## 2022-06-05 LAB — T4: T4, Total: 10.4 ug/dL (ref 4.5–12.0)

## 2022-06-05 MED ORDER — METOPROLOL TARTRATE 5 MG/5ML IV SOLN
5.0000 mg | Freq: Once | INTRAVENOUS | Status: AC
  Administered 2022-06-05: 5 mg via INTRAVENOUS
  Filled 2022-06-05: qty 5

## 2022-06-05 MED ORDER — FUROSEMIDE 10 MG/ML IJ SOLN
40.0000 mg | Freq: Once | INTRAMUSCULAR | Status: AC
  Administered 2022-06-05: 40 mg via INTRAVENOUS
  Filled 2022-06-05: qty 4

## 2022-06-05 MED ORDER — HALOPERIDOL 0.5 MG PO TABS
1.0000 mg | ORAL_TABLET | Freq: Four times a day (QID) | ORAL | Status: DC | PRN
  Administered 2022-06-05 – 2022-06-06 (×2): 1 mg via ORAL
  Filled 2022-06-05 (×2): qty 2

## 2022-06-05 MED ORDER — LORAZEPAM 1 MG PO TABS
1.0000 mg | ORAL_TABLET | Freq: Three times a day (TID) | ORAL | Status: DC | PRN
  Administered 2022-06-05: 1 mg via ORAL
  Filled 2022-06-05: qty 1

## 2022-06-05 MED ORDER — FUROSEMIDE 10 MG/ML IJ SOLN
40.0000 mg | Freq: Two times a day (BID) | INTRAMUSCULAR | Status: DC
  Administered 2022-06-05 – 2022-06-06 (×2): 40 mg via INTRAVENOUS
  Filled 2022-06-05 (×2): qty 4

## 2022-06-05 MED ORDER — DILTIAZEM HCL 60 MG PO TABS
60.0000 mg | ORAL_TABLET | Freq: Four times a day (QID) | ORAL | Status: DC
Start: 2022-06-05 — End: 2022-06-07
  Administered 2022-06-05 – 2022-06-07 (×8): 60 mg via ORAL
  Filled 2022-06-05 (×8): qty 1

## 2022-06-05 MED ORDER — HALOPERIDOL LACTATE 5 MG/ML IJ SOLN: 1.0000 mg | Freq: Four times a day (QID) | INTRAMUSCULAR | Status: DC | PRN

## 2022-06-05 NOTE — Hospital Course (Signed)
[name] was admitted to the hospital with the working diagnosis of atrial fibrillation with RVR.   81 yo female with the past medical history of atrial fibrillation, aortic regurgitation, diastolic heart failure, and mitral stenosis who presented with dyspnea and chest tightness. Reported 24 hours of symptoms, associated with palpitations and peripheral edema. On her initial physical examination her blood pressure was 117/67, HR 92 bpm, RR 27 and 02 saturation 92%, lungs with no wheezing or rales, heart with S1 and S2 present irregularly irregular, abdomen not distended, positive lower extremity edema.   Na 140, K 4.2 Cl 106, bicarbonate 28 glucose 112 bun 14 cr 1,13 Wbc 8,0 hgb 11,8 plt 266  TSH 0,031 Free T 4 is 1,43  [place] Sg 1,016, 30 protein,   Chest radiograph with hyperinflation with right sided rotation, no infiltrates.   EKG 71 bpm, right axis deviation, right bundle branch block, sinus rhythm with PVC, no significant ST segment or T wave changes.   Patient was placed on diltiazem infusion and converted to sinus rhythm.

## 2022-06-05 NOTE — Assessment & Plan Note (Signed)
Patient with signs of volume overload.  Echocardiogram with preserved LV systolic function EF 60 to 35%, RV systolic function preserved.RVSP 41.9.  Left atrial size with severe dilatation. 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 Moderate TR. Severe aortic valve regurgitation.   Patient clinically congested.   Plan to add furosemide IV 40 mg q12 Continue close blood pressure monitoring Considering her advance age and functional capacity likely she is not candidate for invasive valve interventions.

## 2022-06-05 NOTE — Progress Notes (Signed)
Progress Note   [name]: [name]:[id number] DOB: Sep 06, 1941 DOA: 06/03/2022     0 DOS: the [name] was seen and examined on 06/05/2022   Brief hospital course: [name] was admitted to the hospital with the working diagnosis of atrial fibrillation with RVR.   81 yo female with the past medical history of atrial fibrillation, aortic regurgitation, diastolic heart failure, and mitral stenosis who presented with dyspnea and chest tightness. Reported 24 hours of symptoms, associated with palpitations and peripheral edema. On [name] initial physical examination [name] blood pressure was 117/67, HR 92 bpm, RR 27 and 02 saturation 92%, lungs with no wheezing or rales, heart with S1 and S2 present irregularly irregular, abdomen not distended, positive lower extremity edema.   Na 140, K 4.2 Cl 106, bicarbonate 28 glucose 112 bun 14 cr 1,13 Wbc 8,0 hgb 11,8 plt 266  TSH 0,031 Free T 4 is 1,43  [place] Sg 1,016, 30 protein,   Chest radiograph with hyperinflation with right sided rotation, no infiltrates.   EKG 71 bpm, right axis deviation, right bundle branch block, sinus rhythm with PVC, no significant ST segment or T wave changes.   [name] was placed on diltiazem infusion and converted to sinus rhythm.   Assessment and Plan: * Atrial fibrillation with RVR ([name]) [name] continue with atrial fibrillation with RVR, with rate up to the 130's.  Plan to increase diltiazem to 60 mg q6 hrs  Continue close telemetry monitoring Anticoagulation with apixaban Out of bed to chair tid with meals Pt and Ot.   Acute on chronic diastolic CHF (congestive heart failure) ([name]) [name] with signs of volume overload.  Echocardiogram with preserved LV systolic function EF 60 to 73%, RV systolic function preserved.RVSP 41.9.  Left atrial size with severe dilatation. 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 Moderate TR. Severe aortic valve regurgitation.   [name] clinically  congested.   Plan to add furosemide IV 40 mg q12 Continue close blood pressure monitoring Considering [name] advance age and functional capacity likely she is not candidate for invasive valve interventions.   Essential hypertension Blood pressure control with diltiazem. Continue close monitoring   GERD (gastroesophageal reflux disease) - Continue Protonix  Hypothyroid Elevated free t4 and low TSH consistent with medical induced hyperthyroid Continue with lower dose of levothyroxine.    Anxiety On lorazepam as needed, will change to tid per [name] home regimen of as needed oral lorazepam.         Subjective: [name] continue with dyspnea, no chest pain.   Physical Exam: Vitals:   06/05/22 0818 06/05/22 1000 06/05/22 1100 06/05/22 1223  BP:  (!) 106/52 (!) 119/57 106/61  Pulse: 69 69 76   Resp: 14 (!) 27 (!) 25   Temp: 98.3 F (36.8 C)  98.5 F (36.9 C)   TempSrc: Oral  Oral   SpO2: 96% 94% 93%   Weight:      Height:       Neurology awake and alert ENT with mild pallor Cardiovascular with S1 and S2 present, irregularly irregular with no gallops or rubs, positive systolic murmur at the lower right sternal border. Positive JVD Respiratory with rales bilaterally with no wheezing  Abdomen not distended.  Data Reviewed:    Family Communication: I spoke with [name]'s husband  at the bedside, we talked in detail about [name]'s condition, plan of care and prognosis and all questions were addressed.   Disposition: Status is: Observation The [name] will require care spanning > 2  midnights and should be moved to inpatient because: continue telemetry monitoring and IV diuresis  Planned Discharge Destination: Home    Author: Tawni Millers, MD 06/05/2022 1:32 PM  For on call review [email or web address].

## 2022-06-05 NOTE — Assessment & Plan Note (Signed)
On lorazepam as needed, will change to tid per her home regimen of as needed oral lorazepam.

## 2022-06-05 NOTE — Progress Notes (Signed)
Patient's heart rate increased to 130's-140's sustained in Afib. Notified Dr. Cathlean Sauer and was ordered to give Metoprolol '5mg'$  IV. Gave dose to pt. And heart rate is now sustaining around 100 bpm.  Also pt. Noted to be confused about place and situation. Ativan '1mg'$  PO was given earlier. I got pt. Laid back in bed and cut tv volume down and lights down low and covered pt. Up with blankets. She is resting much better now.

## 2022-06-06 ENCOUNTER — Encounter (HOSPITAL_COMMUNITY): Payer: Self-pay | Admitting: Internal Medicine

## 2022-06-06 DIAGNOSIS — I10 Essential (primary) hypertension: Secondary | ICD-10-CM | POA: Diagnosis not present

## 2022-06-06 DIAGNOSIS — K219 Gastro-esophageal reflux disease without esophagitis: Secondary | ICD-10-CM | POA: Diagnosis not present

## 2022-06-06 DIAGNOSIS — I4891 Unspecified atrial fibrillation: Secondary | ICD-10-CM | POA: Diagnosis not present

## 2022-06-06 DIAGNOSIS — I5033 Acute on chronic diastolic (congestive) heart failure: Secondary | ICD-10-CM | POA: Diagnosis not present

## 2022-06-06 LAB — BASIC METABOLIC PANEL
Anion gap: 9 (ref 5–15)
BUN: 16 mg/dL (ref 8–23)
CO2: 29 mmol/L (ref 22–32)
Calcium: 8.7 mg/dL — ABNORMAL LOW (ref 8.9–10.3)
Chloride: 101 mmol/L (ref 98–111)
Creatinine, Ser: 1.21 mg/dL — ABNORMAL HIGH (ref 0.44–1.00)
GFR, Estimated: 44 mL/min — ABNORMAL LOW (ref >60)
Glucose, Bld: 99 mg/dL (ref 70–99)
Potassium: 3.7 mmol/L (ref 3.5–5.1)
Sodium: 139 mmol/L (ref 135–145)

## 2022-06-06 MED ORDER — FUROSEMIDE 10 MG/ML IJ SOLN
40.0000 mg | Freq: Every day | INTRAMUSCULAR | Status: DC
  Administered 2022-06-07: 40 mg via INTRAVENOUS
  Filled 2022-06-06 (×2): qty 4

## 2022-06-06 MED ORDER — ADENOSINE 6 MG/2ML IV SOLN
INTRAVENOUS | Status: AC
  Filled 2022-06-06: qty 4

## 2022-06-06 MED ORDER — METOPROLOL TARTRATE 25 MG PO TABS
25.0000 mg | ORAL_TABLET | Freq: Two times a day (BID) | ORAL | Status: DC
  Administered 2022-06-06 – 2022-06-07 (×3): 25 mg via ORAL
  Filled 2022-06-06 (×3): qty 1

## 2022-06-06 NOTE — Evaluation (Signed)
Physical Therapy Evaluation Patient Details Name: [name] MRN: [id number] DOB: 09-25-1941 Today's Date: 06/06/2022  History of Present Illness  [name] is a 81 y.o. female with medical history significant of atrial fibrillation, aortic regurgitation, chronic diastolic heart failure, GERD, mitral stenosis, and more presents to the ER with a chief complaint of chest tightness.  Patient is a poor historian.  After she says she has palpitations, then she reports she never felt her heart racing she just knew it was high because she checked her pulse ox.  Since she was checking her pulse ox she was asked if she was short of breath and at first she said yes and then when you ask her how long she was short of breath she said she never felt short of breath she only felt chest tightness.  Is almost impossible to get a timeline as she wants to explain every symptom from the first time she ever felt in her life and set up for this episode.  From what could be pieced together she started having chest tightness yesterday.  She had felt fatigued all throughout the day.  She reports she has felt fatigue, and not like herself since she was last hospitalized in the early summer.  She also reports peripheral edema, but again timeline could not be specified.  She had a decreased appetite and lost 101 pounds.  The weight loss started with a diarrheal illness, and an unknown time.  Her appetite is coming back, but it is unclear if her weight is stabilized.  She has no other symptoms.  No infectious symptoms.  She has not missed any doses of her metoprolol.  She is compliant with her Eliquis.  She has no other complaints at this time.   Clinical Impression  Patient seated in chair at beginning of session on 2L O2 with sat at 100%.  Patient sat remained above 96% at rest room air. Monitored O2 with ambulation on room air between 90-100% with slight SOB at end of ambulation upon return to room. Patient does not  require assist to transfer to standing with use of SPC. She ambulates with minimal use of SPC despite cueing for proper SPC use. She demonstrates minimal unsteadiness intermittently while ambulating without loss of balance. Patient returned to room at end of session. Patient will benefit from continued skilled physical therapy in hospital and recommended venue below to increase strength, balance, endurance for safe ADLs and gait.      Recommendations for follow up therapy are one component of a multi-disciplinary discharge planning process, led by the attending physician.  Recommendations may be updated based on patient status, additional functional criteria and insurance authorization.  Follow Up Recommendations Home health PT      Assistance Recommended at Discharge Intermittent Supervision/Assistance  Patient can return home with the following  A little help with bathing/dressing/bathroom;Assistance with cooking/housework;Assist for transportation    Equipment Recommendations None recommended by PT  Recommendations for Other Services       Functional Status Assessment Patient has had a recent decline in their functional status and demonstrates the ability to make significant improvements in function in a reasonable and predictable amount of time.     Precautions / Restrictions Precautions Precautions: Fall Restrictions Weight Bearing Restrictions: No      Mobility  Bed Mobility               General bed mobility comments: seated in chair at beginning of session  Transfers Overall transfer level: Needs assistance Equipment used: Straight cane Transfers: Sit to/from Stand Sit to Stand: Min guard, Supervision                Ambulation/Gait Ambulation/Gait assistance: Min guard Gait Distance (Feet): 100 Feet Assistive device: Straight cane Gait Pattern/deviations: Decreased stride length Gait velocity: decreased     General Gait Details: intermittently  unsteady without loss of balance, minimal SPC use depsite cueing for proper use  Stairs            Wheelchair Mobility    Modified Rankin (Stroke Patients Only)       Balance Overall balance assessment: Needs assistance Sitting-balance support: No upper extremity supported, Feet supported Sitting balance-Leahy Scale: Good     Standing balance support: Single extremity supported Standing balance-Leahy Scale: Fair Standing balance comment: without AD/with SPC                             Pertinent Vitals/Pain Pain Assessment Pain Assessment: No/denies pain    Home Living Family/patient expects to be discharged to:: Private residence Living Arrangements: Spouse/significant other Available Help at Discharge: Family Type of Home: Apartment Home Access: Level entry       Home Layout: One level Home Equipment: Advice worker (2 wheels);Wheelchair - manual;BSC/3in1;Cane - single point      Prior Function Prior Level of Function : Independent/Modified Independent             Mobility Comments: household ambulation with SPC PRN ADLs Comments: states independent and spouse assists PRN     Hand Dominance        Extremity/Trunk Assessment   Upper Extremity Assessment Upper Extremity Assessment: Defer to OT evaluation    Lower Extremity Assessment Lower Extremity Assessment: Generalized weakness;Overall [facility] for tasks assessed    Cervical / Trunk Assessment Cervical / Trunk Assessment: Normal  Communication   Communication: No difficulties  Cognition Arousal/Alertness: Awake/alert   Overall Cognitive Status: Within Functional Limits for tasks assessed                                          General Comments      Exercises     Assessment/Plan    PT Assessment Patient needs continued PT services  PT Problem List Decreased strength;Decreased mobility;Decreased activity tolerance;Decreased balance;Decreased  knowledge of use of DME       PT Treatment Interventions DME instruction;Therapeutic exercise;Gait training;Balance training;Stair training;Neuromuscular re-education;Functional mobility training;Patient/family education;Therapeutic activities    PT Goals (Current goals can be found in the Care Plan section)  Acute Rehab PT Goals Patient Stated Goal: return home PT Goal Formulation: With patient Time For Goal Achievement: 06/20/22 Potential to Achieve Goals: Good    Frequency Min 3X/week     Co-evaluation               AM-PAC PT "6 Clicks" Mobility  Outcome Measure Help needed turning from your back to your side while in a flat bed without using bedrails?: None Help needed moving from lying on your back to sitting on the side of a flat bed without using bedrails?: A Little Help needed moving to and from a bed to a chair (including a wheelchair)?: A Little Help needed standing up from a chair using your arms (e.g., wheelchair or bedside chair)?: A Little Help needed to  walk in hospital room?: A Little Help needed climbing 3-5 steps with a railing? : A Little 6 Click Score: 19    End of Session Equipment Utilized During Treatment: Gait belt Activity Tolerance: Patient tolerated treatment well Patient left: in chair;with call bell/phone within reach Nurse Communication: Mobility status PT Visit Diagnosis: Unsteadiness on feet (R26.81);Other abnormalities of gait and mobility (R26.89);History of falling (Z91.81);Muscle weakness (generalized) (M62.81)    Time: 8316-7425 PT Time Calculation (min) (ACUTE ONLY): 20 min   Charges:   PT Evaluation $PT Eval Moderate Complexity: 1 Mod PT Treatments $Therapeutic Activity: 8-22 mins        12:00 PM, 06/06/22 Mearl Latin PT, DPT Physical Therapist at [facility]

## 2022-06-06 NOTE — Plan of Care (Signed)
  Problem: Acute Rehab PT Goals(only PT should resolve) Goal: Patient Will Transfer Sit To/From Stand 06/06/2022 1202 by Mearl Latin, PT Flowsheets (Taken 06/06/2022 1202) Patient will transfer sit to/from stand: with modified independence 06/06/2022 1202 by Berma Harts, Vianne Bulls, PT Outcome: Progressing Goal: Pt Will Transfer Bed To Chair/Chair To Bed 06/06/2022 1202 by Mearl Latin, PT Flowsheets (Taken 06/06/2022 1202) Pt will Transfer Bed to Chair/Chair to Bed: with modified independence 06/06/2022 1202 by Odell Choung, Vianne Bulls, PT Outcome: Progressing Goal: Pt Will Ambulate 06/06/2022 1202 by Mearl Latin, PT Flowsheets (Taken 06/06/2022 1202) Pt will Ambulate:  > 125 feet  with supervision  with least restrictive assistive device 06/06/2022 1202 by Brigitt Mcclish, Vianne Bulls, PT Outcome: Progressing Goal: Pt/caregiver will Perform Home Exercise Program 06/06/2022 1202 by Mearl Latin, PT Flowsheets (Taken 06/06/2022 1202) Pt/caregiver will Perform Home Exercise Program:  For increased strengthening  For improved balance  Independently 06/06/2022 1202 by Mearl Latin, PT Outcome: Progressing  12:03 PM, 06/06/22 Mearl Latin PT, DPT Physical Therapist at [facility]

## 2022-06-06 NOTE — Progress Notes (Addendum)
Progress Note   Patient: [name] XKG:[id number] DOB: August 04, 1941 DOA: 06/03/2022     1 DOS: the patient was seen and examined on 06/06/2022   Brief hospital course: Mrs. Mckellips was admitted to the hospital with the working diagnosis of atrial fibrillation with RVR.   81 yo female with the past medical history of atrial fibrillation, aortic regurgitation, diastolic heart failure, and mitral stenosis who presented with dyspnea and chest tightness. Reported 24 hours of symptoms, associated with palpitations and peripheral edema. On her initial physical examination her blood pressure was 117/67, HR 92 bpm, RR 27 and 02 saturation 92%, lungs with no wheezing or rales, heart with S1 and S2 present irregularly irregular, abdomen not distended, positive lower extremity edema.   Na 140, K 4.2 Cl 106, bicarbonate 28 glucose 112 bun 14 cr 1,13 Wbc 8,0 hgb 11,8 plt 266  TSH 0,031 Free T 4 is 1,43  [place] Sg 1,016, 30 protein,   Chest radiograph with hyperinflation with right sided rotation, no infiltrates.   #1 EKG 71 bpm, right axis deviation, right bundle branch block, sinus rhythm with PVC, no significant ST segment or T wave changes.   #2 EKG 142 bpm, right axis deviation, right bundle branch block, SVT with no significant ST segment or T wave changes.  Telemetry monitor with atrial fibrillation with rapid ventricular response.   Patient was placed on diltiazem infusion and converted transitory to sinus rhythm.  She is back in atrial fibrillation with RVR. On diltiazem and added metoprolol for rate control Acute diastolic heart failure, requiring IV furosemide.   Assessment and Plan: * Atrial fibrillation with RVR (H[facility]) Continue atrial fibrillation rhythm, 90 to 140 bpm, telemetry personally reviewed.  Plan to continue with diltiazem to 60 mg q6 hrs and add metoprolol 25 mg po bid for better rate control.   Anticoagulation with apixaban Out of bed to chair tid with meals Pt and Ot.    Acute on chronic diastolic CHF (congestive heart failure) (HCC) Echocardiogram with preserved LV systolic function EF 60 to 70%, RV systolic function preserved.RVSP 41.9.  Left atrial size with severe dilatation. 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 Moderate TR. Severe aortic valve regurgitation.   No documentation on urine output. Systolic blood pressure 90 to 116 mmHg.   Continue rate control with diltiazem and metoprolol. Continue diuresis with furosemide 40 mg IV daily Strict in and out's Out of bed to chair tid with meals.   Essential hypertension Systolic blood pressure 90 to 116 mmHg. Continue diuresis with furosemide and rate control with diltiazem and metoprolol.   GERD (gastroesophageal reflux disease) - Continue Protonix  Hypothyroid Elevated free t4 and low TSH consistent with medical induced hyperthyroid Continue with lower dose of levothyroxine.    Anxiety Continue with tid PRN lorazepam.  Added as needed haldol for agitation.         Subjective: Patient with improvement in her dyspnea, continue to have palpitations, no chest pain, tolerating po well.   Physical Exam: Vitals:   06/06/22 0413 06/06/22 0414 06/06/22 0415 06/06/22 0700  BP:      Pulse: (!) 50 (!) 101 89   Resp: (!) 23 (!) 22 (!) 22   Temp:   98.2 F (36.8 C) 98.2 F (36.8 C)  TempSrc:   Axillary Oral  SpO2: 98% 98% 98%   Weight:      Height:       Neurology awake and alert, no confusion or agitation ENT with  mild pallor but not icterus Cardiovascular with S1 and S2 present irregularly irregular with no gallops, positive systolic murmur at the right lower sternal border. Respiratory with no wheezing, or rhonchi scattered rales Abdomen not distended No lower extremity edema  Data Reviewed:    Family Communication: I spoke with patient's husband at the bedside, we talked in detail about patient's condition, plan of care and prognosis and all questions  were addressed.   Disposition: Status is: Inpatient Remains inpatient appropriate because: uncontrolled atrial fibrillation   Planned Discharge Destination: Home    Author: Tawni Millers, MD 06/06/2022 8:53 AM  For on call review [email or web address].

## 2022-06-07 ENCOUNTER — Telehealth: Payer: Self-pay | Admitting: Family Medicine

## 2022-06-07 DIAGNOSIS — I4891 Unspecified atrial fibrillation: Secondary | ICD-10-CM | POA: Diagnosis not present

## 2022-06-07 LAB — BASIC METABOLIC PANEL
Anion gap: 9 (ref 5–15)
BUN: 17 mg/dL (ref 8–23)
CO2: 29 mmol/L (ref 22–32)
Calcium: 8.4 mg/dL — ABNORMAL LOW (ref 8.9–10.3)
Chloride: 98 mmol/L (ref 98–111)
Creatinine, Ser: 1.16 mg/dL — ABNORMAL HIGH (ref 0.44–1.00)
GFR, Estimated: 47 mL/min — ABNORMAL LOW (ref >60)
Glucose, Bld: 102 mg/dL — ABNORMAL HIGH (ref 70–99)
Potassium: 3.2 mmol/L — ABNORMAL LOW (ref 3.5–5.1)
Sodium: 136 mmol/L (ref 135–145)

## 2022-06-07 LAB — MAGNESIUM: Magnesium: 1.6 mg/dL — ABNORMAL LOW (ref 1.7–2.4)

## 2022-06-07 MED ORDER — DILTIAZEM HCL ER COATED BEADS 180 MG PO CP24
180.0000 mg | ORAL_CAPSULE | Freq: Once | ORAL | Status: AC
Start: 2022-06-07 — End: 2022-06-07
  Administered 2022-06-07: 180 mg via ORAL
  Filled 2022-06-07: qty 1

## 2022-06-07 MED ORDER — DILTIAZEM HCL ER COATED BEADS 240 MG PO CP24: 240.0000 mg | ORAL_CAPSULE | Freq: Every day | ORAL | 1 refills | Status: DC

## 2022-06-07 MED ORDER — DILTIAZEM HCL ER COATED BEADS 240 MG PO CP24: 240.0000 mg | ORAL_CAPSULE | Freq: Every day | ORAL | Status: DC

## 2022-06-07 MED ORDER — POTASSIUM CHLORIDE CRYS ER 20 MEQ PO TBCR
40.0000 meq | EXTENDED_RELEASE_TABLET | Freq: Two times a day (BID) | ORAL | Status: DC
  Administered 2022-06-07: 40 meq via ORAL
  Filled 2022-06-07: qty 2

## 2022-06-07 MED ORDER — MAGNESIUM SULFATE 2 GM/50ML IV SOLN
2.0000 g | Freq: Once | INTRAVENOUS | Status: AC
  Administered 2022-06-07: 2 g via INTRAVENOUS
  Filled 2022-06-07: qty 50

## 2022-06-07 MED ORDER — LEVOTHYROXINE SODIUM 25 MCG PO TABS: 25.0000 ug | ORAL_TABLET | Freq: Every day | ORAL | 0 refills | Status: DC

## 2022-06-07 NOTE — Progress Notes (Signed)
Discharge instructions reviewed with patient and husband. Both verbalized understanding of instructions, discharged home in stable condition with husband.

## 2022-06-07 NOTE — TOC Transition Note (Signed)
Transition of Care [facility]) - CM/SW Discharge Note   Patient Details  Name: [name] MRN: [id number] Date of Birth: 04/22/1941  Transition of Care [facility]) CM/SW Contact:  Boneta Lucks, RN Phone Number: 06/07/2022, 3:49 PM   Clinical Narrative:   Patient admitted with atrial fibrillation. Discharging home today. PT is recommending HHPT , Patient is requesting [facility]. Orders are in and referral sent to [facility].   Final next level of care: [place] Barriers to Discharge: Barriers Resolved  Patient Goals and CMS Choice Patient states their goals for this hospitalization and ongoing recovery are:: to go home. CMS Medicare.gov Compare Post Acute Care list provided to:: Patient Choice offered to / list presented to : Patient  Discharge Placement            Patient and family notified of of transfer: 06/07/22  Discharge Plan and Services      HH Arranged: PT [facility] Agency: [place] Date [place]: 06/07/22 Time [place]: [id number] Representative spoke with at [place]: Georgina Snell Readmission Risk Interventions    06/07/2022   11:15 AM  Readmission Risk Prevention Plan  Transportation Screening Complete  PCP or Specialist Appt within 5-7 Days Complete  Home Care Screening Complete  Medication Review (RN CM) Complete

## 2022-06-07 NOTE — Progress Notes (Signed)
Progress Note  Patient Name: CHRISTINEA BRIZUELA Date of Encounter: 06/07/2022  [facility] Cardiologist: Skeet Latch, MD   Subjective   No complaints  Inpatient Medications    Scheduled Meds:  apixaban  2.5 mg Oral BID   Chlorhexidine Gluconate Cloth  6 each Topical Daily   diltiazem  60 mg Oral Q6H   feeding supplement  237 mL Oral BID BM   furosemide  40 mg Intravenous Daily   metoprolol tartrate  25 mg Oral BID   pantoprazole  40 mg Oral Daily   potassium chloride  40 mEq Oral BID   Continuous Infusions:  magnesium sulfate bolus IVPB     PRN Meds: acetaminophen **OR** acetaminophen, haloperidol **OR** haloperidol lactate, levalbuterol, LORazepam, ondansetron **OR** ondansetron (ZOFRAN) IV, oxyCODONE, polyethylene glycol   Vital Signs    Vitals:   06/07/22 0420 06/07/22 0500 06/07/22 0600 06/07/22 0724  BP:  102/66 123/67   Pulse:  85 92 (!) 106  Resp:  19 20 (!) 28  Temp: 98 F (36.7 C)   98.2 F (36.8 C)  TempSrc: Oral   Oral  SpO2:  98% 100% 96%  Weight:      Height:        Intake/Output Summary (Last 24 hours) at 06/07/2022 0848 Last data filed at 06/07/2022 0400 Gross per 24 hour  Intake 330 ml  Output 702 ml  Net -372 ml      06/07/2022    3:35 AM 06/06/2022    3:54 AM 06/04/2022    9:00 AM  Last 3 Weights  Weight (lbs) 108 lb 0.4 oz 110 lb 0.2 oz 107 lb 9.4 oz  Weight (kg) 49 kg 49.9 kg 48.8 kg      Telemetry    Afib 80s to 90s - Personally Reviewed  ECG    N/a - Personally Reviewed  Physical Exam   GEN: No acute distress.   Neck: No JVD Cardiac: irreg Respiratory: Clear to auscultation bilaterally. GI: Soft, nontender, non-distended  MS: No edema; No deformity. Neuro:  Nonfocal  Psych: Normal affect   Labs    High Sensitivity Troponin:   Recent Labs  Lab 06/03/22 1950 06/03/22 2134  TROPONINIHS 6 7     Chemistry Recent Labs  Lab 06/03/22 2008 06/04/22 0531 06/05/22 0359 06/06/22 0548 06/07/22 0428  NA 140  141 139 139 136  K 4.2 4.4 4.0 3.7 3.2*  CL 106 108 105 101 98  CO2 '28 28 27 29 29  '$ GLUCOSE 112* 113* 126* 99 102*  BUN '14 13 14 16 17  '$ CREATININE 1.13* 0.94 1.27* 1.21* 1.16*  CALCIUM 8.8* 8.9 8.7* 8.7* 8.4*  MG  --  1.6* 2.0  --  1.6*  PROT 7.2 6.4*  --   --   --   ALBUMIN 3.5 3.1*  --   --   --   AST 19 23  --   --   --   ALT 11 12  --   --   --   ALKPHOS 99 88  --   --   --   BILITOT 0.6 0.7  --   --   --   GFRNONAA 48* >60 42* 44* 47*  ANIONGAP '6 5 7 9 9    '$ Lipids No results for input(s): "CHOL", "TRIG", "HDL", "LABVLDL", "LDLCALC", "CHOLHDL" in the last 168 hours.  Hematology Recent Labs  Lab 06/03/22 1950 06/04/22 0531 06/05/22 0359  WBC 8.0 8.9 8.2  RBC 3.96 3.56* 3.33*  HGB 11.8* 10.7* 10.1*  HCT 37.7 33.9* 32.2*  MCV 95.2 95.2 96.7  MCH 29.8 30.1 30.3  MCHC 31.3 31.6 31.4  RDW 14.9 14.9 14.9  PLT 266 222 203   Thyroid  Recent Labs  Lab 06/03/22 2004 06/03/22 2008  TSH 0.031*  --   FREET4  --  1.43*    BNPNo results for input(s): "BNP", "PROBNP" in the last 168 hours.  DDimer No results for input(s): "DDIMER" in the last 168 hours.   Radiology    No results found.  Cardiac Studies    Patient Profile     WILLINE SCHWALBE is a 81 y.o. female with a hx of paroxysmal atrial fibrillation (documented during admission in 03/2022 and converted to NSR with IV Cardizem), valvular heart disease (echo in 03/2022 showing mild MR, mild MS and severe AI)  COPD, Hypothyroidism and GERD who is being seen 06/04/2022 for the evaluation of atrial fibrillation with RVR at the request of Dr. Clearence Ped.  Assessment & Plan    1.Afib with RVR - history of PAF, LAVI 55 mainting SR would be difficult, rate control best option at this time - medical therapy with dilt '60mg'$  every 6 hrs, lopressor '25mg'$  bid, eliquis 2.'5mg'$  bid.  - rates are 80s to 90s on this regimen, bps are normal - consolidate diltiazem to '240mg'$  daily, since took AM dose can start tomorrw AM. Since d/c  today and got '60mg'$  dilt this AM give one time dose '180mg'$  long acting.    2. Valvular heart diseaes - - Echo in 03/2022 showed mild MR, mild MS and severe AI. LVE 60-65%, LVIDs 2.6. She is scheduled for a follow-up echocardiogram in 09/2022.   3. HFpEF - 03/2022 echo LVE 60-5%, no WMAs, grade III dd. NOrmal RV function, severe LAE LAVI 55 - euvolemic today, can continue home diuretic dose  We will sign off inpatient care, ok for discharge from cardiac standpoint   For questions or updates, please contact [facility] Please consult [email or web address] for contact info under        Signed, Carlyle Dolly, MD  06/07/2022, 8:48 AM

## 2022-06-07 NOTE — Discharge Summary (Signed)
Physician Discharge Summary  [name] YNW:[id number] DOB: 11-16-1940 DOA: 06/03/2022  PCP: Dettinger, Fransisca Kaufmann, MD  Admit date: 06/03/2022  Discharge date: 06/07/2022  Admitted From:Home  Disposition:  Home  Recommendations for Outpatient Follow-up:  Follow up with PCP in 1-2 weeks Follow-up with cardiology outpatient which will be scheduled Continue Cardizem 240 mg daily as prescribed Continue apixaban for anticoagulation Continue metoprolol 25 mg twice daily as well as other home medications as prescribed Levothyroxine to 25 mcg daily as opposed to 50 mcg daily.  Home Health: Yes with PT  Equipment/Devices: None  Discharge Condition:Stable  CODE STATUS: Full  Diet recommendation: Heart Healthy  Brief/Interim Summary: 81 yo female with the past medical history of atrial fibrillation, aortic regurgitation, diastolic heart failure, and mitral stenosis who presented with dyspnea and chest tightness. Reported 24 hours of symptoms, associated with palpitations and peripheral edema.  Patient was admitted with atrial fibrillation with RVR in the setting of acute on chronic diastolic CHF exacerbation.  She has diuresed well and is currently euvolemic.  Her diltiazem dose was increased throughout the course of her stay and metoprolol was added to help with better rate control as well.  She was noted to have elevated free T4 and low TSH consistent with medical induced hyperthyroidism and will now be on lower doses of levothyroxine as noted above.  2D echocardiogram as noted below with preserved LV systolic function.  She remains on Eliquis for anticoagulation and appears to be in stable condition for discharge today on doses as noted above.  Cardiology will plan for outpatient follow-up.  No other acute events or concerns noted.  Discharge Diagnoses:  Principal Problem:   Atrial fibrillation with RVR (HCC) Active Problems:   Acute on chronic diastolic CHF (congestive heart failure)  (HCC)   Essential hypertension   GERD (gastroesophageal reflux disease)   Hypothyroid   Anxiety   Atrial fibrillation with rapid ventricular response ([place])  Principal discharge diagnosis: Atrial fibrillation with RVR along with acute on chronic diastolic CHF exacerbation.  Discharge Instructions  Discharge Instructions     Diet - low sodium heart healthy   Complete by: As directed    Increase activity slowly   Complete by: As directed       Allergies as of 06/07/2022       Reactions   Bisphosphonates Other (See Comments)   Difficulty swallowing   Ciprofloxacin Nausea And Vomiting   Codeine Nausea And Vomiting   Sulfa Antibiotics Nausea And Vomiting        Medication List     TAKE these medications    acetaminophen 500 MG tablet Commonly known as: TYLENOL Take 500 mg by mouth every 8 (eight) hours as needed for mild pain or moderate pain.   albuterol 108 (90 Base) MCG/ACT inhaler Commonly known as: VENTOLIN HFA Inhale 2 puffs into the lungs every 6 (six) hours as needed for wheezing or shortness of breath.   apixaban 2.5 MG Tabs tablet Commonly known as: ELIQUIS Take 1 tablet (2.5 mg total) by mouth 2 (two) times daily.   Commode Bedside Misc Bedside commode due to tibial fracture   Wheelchair Misc Pt needs wheelchair due to extremity fractures   Misc. Devices Misc Use rolling walker to ambulate around the house as needed.   diltiazem 240 MG 24 hr capsule Commonly known as: CARDIZEM CD Take 1 capsule (240 mg total) by mouth daily. Start taking on: June 08, 2022   furosemide 20 MG tablet Commonly known as:  LASIX Take 1 tablet (20 mg total) by mouth daily.   levothyroxine 25 MCG tablet Commonly known as: SYNTHROID Take 1 tablet (25 mcg total) by mouth daily. What changed:  medication strength how much to take   loperamide 2 MG capsule Commonly known as: IMODIUM Take 1 capsule (2 mg total) by mouth 4 (four) times daily as needed for diarrhea or  loose stools.   metoprolol tartrate 25 MG tablet Commonly known as: LOPRESSOR Take 1 tablet (25 mg total) by mouth 2 (two) times daily as needed. What changed: reasons to take this   mirtazapine 15 MG disintegrating tablet Commonly known as: REMERON SOL-TAB Take 1 tablet (15 mg total) by mouth at bedtime.   mometasone 50 MCG/ACT nasal spray Commonly known as: Nasonex Place 2 sprays into the nose daily.   omeprazole 40 MG capsule Commonly known as: PRILOSEC Take 1 capsule (40 mg total) by mouth daily.   oxazepam 15 MG capsule Commonly known as: SERAX Take 1 capsule (15 mg total) by mouth 3 (three) times daily as needed for sleep. Cancel any other prescriptions on file   polyethylene glycol 17 g packet Commonly known as: MIRALAX / GLYCOLAX Take 17 g by mouth daily as needed for moderate constipation.   potassium chloride 20 MEQ/15ML (10%) Soln TAKE 7.5ML (10MEQ) BY MOUTH DAILY AS NEEDED FOR CRAMPING What changed: See the new instructions.   Shingrix injection Generic drug: Zoster Vaccine Adjuvanted   sucralfate 1 g tablet Commonly known as: CARAFATE Take 1 tablet (1 g total) by mouth 2 (two) times daily.   triamcinolone ointment 0.5 % Commonly known as: KENALOG APPLY TO THE AFFECTED AREA(S) TOPICALLY TWICE DAILY   Vitamin D3 50 MCG (2000 UT) Chew Chew 4,000 Units by mouth daily.        Follow-up Information     Dettinger, Fransisca Kaufmann, MD. Schedule an appointment as soon as possible for a visit in 1 week(s).   Specialties: Family Medicine, Cardiology Contact information: [place] [address] [phone]                Allergies  Allergen Reactions   Bisphosphonates Other (See Comments)    Difficulty swallowing    Ciprofloxacin Nausea And Vomiting   Codeine Nausea And Vomiting   Sulfa Antibiotics Nausea And Vomiting    Consultations: Cardiology   Procedures/Studies: DG Chest Portable 1 View  Result Date: 06/03/2022 CLINICAL  DATA:  Heart palpitations and dyspnea with exertion EXAM: PORTABLE CHEST 1 VIEW COMPARISON:  Radiographs 03/26/2022 FINDINGS: Stable enlargement cardiomediastinal silhouette given patient rotation. No focal consolidation, pleural effusion, or pneumothorax. Bibasilar and biapical scarring. Interstitial thickening in the right upper lung without discrete consolidation. No acute osseous abnormality. Demineralization. Aortic calcification. Surgical clips right neck. Midthoracic spine vertebroplasty. IMPRESSION: No acute cardiopulmonary process. Aortic Atherosclerosis (ICD10-I70.0) and Emphysema (ICD10-J43.9). Electronically Signed   By: Placido Sou M.D.   On: 06/03/2022 20:01   DG Shoulder Left  Result Date: 06/01/2022 Clinical:  history of left shoulder fracture. X-rays were done of the left shoulder, two views. There is a nondisplaced fracture of the left proximal humerus healing well.  Bone quality is osteoporotic.  Humeral head is well located within the glenoid area.  Apex of lung clear. Impression: healing nondisplaced fracture of the left proximal humerus. Electronically Signed Sanjuana Kava, MD 8/15/202310:04 AM  DG Humerus Left  Result Date: 05/18/2022 Clinical:  history of left humerus fracture. X-rays were done of the left humerus, two views. There  is a healing nondisplaced fracture of the proximal humerus.  Humeral head is well located within glenoid area.  No loose body seen.  Apex of lung clear.  Bone quality is good. Impression:  healing nondisplaced fracture of left proximal humerus. Electronically Signed Sanjuana Kava, MD 8/1/202310:00 AM    Discharge Exam: Vitals:   06/07/22 0600 06/07/22 0724  BP: 123/67   Pulse: 92 (!) 106  Resp: 20 (!) 28  Temp:  98.2 F (36.8 C)  SpO2: 100% 96%   Vitals:   06/07/22 0420 06/07/22 0500 06/07/22 0600 06/07/22 0724  BP:  102/66 123/67   Pulse:  85 92 (!) 106  Resp:  19 20 (!) 28  Temp: 98 F (36.7 C)   98.2 F (36.8 C)  TempSrc: Oral    Oral  SpO2:  98% 100% 96%  Weight:      Height:        General: Pt is alert, awake, not in acute distress Cardiovascular: Irregular, S1/S2 +, no rubs, no gallops Respiratory: CTA bilaterally, no wheezing, no rhonchi Abdominal: Soft, NT, ND, bowel sounds + Extremities: no edema, no cyanosis    The results of significant diagnostics from this hospitalization (including imaging, microbiology, ancillary and laboratory) are listed below for reference.     Microbiology: Recent Results (from the past 240 hour(s))  MRSA Next Gen by PCR, Nasal     Status: None   Collection Time: 06/04/22 10:51 AM   Specimen: Nasal Mucosa; Nasal Swab  Result Value Ref Range Status   MRSA by PCR Next Gen NOT DETECTED NOT DETECTED Final    Comment: (NOTE) The GeneXpert MRSA Assay (FDA approved for NASAL specimens only), is one component of a comprehensive MRSA colonization surveillance program. It is not intended to diagnose MRSA infection nor to guide or monitor treatment for MRSA infections. Test performance is not FDA approved in patients less than 55 years old. Performed at [facility], [address] [place], [place] [address]      Labs: BNP (last 3 results) No results for input(s): "BNP" in the last 8760 hours. Basic Metabolic Panel: Recent Labs  Lab 06/03/22 2008 06/04/22 0531 06/05/22 0359 06/06/22 0548 06/07/22 0428  NA 140 141 139 139 136  K 4.2 4.4 4.0 3.7 3.2*  CL 106 108 105 101 98  CO2 '28 28 27 29 29  '$ GLUCOSE 112* 113* 126* 99 102*  BUN '14 13 14 16 17  '$ CREATININE 1.13* 0.94 1.27* 1.21* 1.16*  CALCIUM 8.8* 8.9 8.7* 8.7* 8.4*  MG  --  1.6* 2.0  --  1.6*   Liver Function Tests: Recent Labs  Lab 06/03/22 2008 06/04/22 0531  AST 19 23  ALT 11 12  ALKPHOS 99 88  BILITOT 0.6 0.7  PROT 7.2 6.4*  ALBUMIN 3.5 3.1*   No results for input(s): "LIPASE", "AMYLASE" in the last 168 hours. No results for input(s): "AMMONIA" in the last 168 hours. CBC: Recent Labs  Lab  06/03/22 1950 06/04/22 0531 06/05/22 0359  WBC 8.0 8.9 8.2  NEUTROABS  --  6.2  --   HGB 11.8* 10.7* 10.1*  HCT 37.7 33.9* 32.2*  MCV 95.2 95.2 96.7  PLT 266 222 203   Cardiac Enzymes: No results for input(s): "CKTOTAL", "CKMB", "CKMBINDEX", "TROPONINI" in the last 168 hours. BNP: Invalid input(s): "POCBNP" CBG: No results for input(s): "GLUCAP" in the last 168 hours. D-Dimer No results for input(s): "DDIMER" in the last 72 hours. Hgb A1c No results for input(s): "HGBA1C"  in the last 72 hours. Lipid Profile No results for input(s): "CHOL", "HDL", "LDLCALC", "TRIG", "CHOLHDL", "LDLDIRECT" in the last 72 hours. Thyroid function studies No results for input(s): "TSH", "T4TOTAL", "T3FREE", "THYROIDAB" in the last 72 hours.  Invalid input(s): "FREET3" Anemia work up No results for input(s): "VITAMINB12", "FOLATE", "FERRITIN", "TIBC", "IRON", "RETICCTPCT" in the last 72 hours. Urinalysis    Component Value Date/Time   COLORURINE YELLOW 06/04/2022 0319   APPEARANCEUR CLEAR 06/04/2022 0319   APPEARANCEUR Clear 04/05/2018 1643   LABSPEC 1.016 06/04/2022 0319   PHURINE 6.0 06/04/2022 0319   GLUCOSEU NEGATIVE 06/04/2022 0319   HGBUR NEGATIVE 06/04/2022 0319   BILIRUBINUR NEGATIVE 06/04/2022 0319   BILIRUBINUR Negative 04/05/2018 1643   KETONESUR NEGATIVE 06/04/2022 0319   PROTEINUR 30 (A) 06/04/2022 0319   UROBILINOGEN negative 09/25/2014 1147   NITRITE NEGATIVE 06/04/2022 0319   LEUKOCYTESUR NEGATIVE 06/04/2022 0319   Sepsis Labs Recent Labs  Lab 06/03/22 1950 06/04/22 0531 06/05/22 0359  WBC 8.0 8.9 8.2   Microbiology Recent Results (from the past 240 hour(s))  MRSA Next Gen by PCR, Nasal     Status: None   Collection Time: 06/04/22 10:51 AM   Specimen: Nasal Mucosa; Nasal Swab  Result Value Ref Range Status   MRSA by PCR Next Gen NOT DETECTED NOT DETECTED Final    Comment: (NOTE) The GeneXpert MRSA Assay (FDA approved for NASAL specimens only), is one component  of a comprehensive MRSA colonization surveillance program. It is not intended to diagnose MRSA infection nor to guide or monitor treatment for MRSA infections. Test performance is not FDA approved in patients less than 59 years old. Performed at [facility], [address], [place], [place] [address]      Time coordinating discharge: 35 minutes  SIGNED:   Rodena Goldmann, DO [facility] 06/07/2022, 9:21 AM  If 7PM-7AM, please contact night-coverage [email or web address]

## 2022-06-07 NOTE — Telephone Encounter (Signed)
Pts husband called to schedule pt a hospital follow up for one day next week.   Please call husband back to schedule the appt.

## 2022-06-07 NOTE — Telephone Encounter (Signed)
Appointment scheduled 06/10/22 at 11:05 am with Monia Pouch.  Dr. Warrick Parisian does not have any appointments available.

## 2022-06-08 ENCOUNTER — Telehealth: Payer: Self-pay

## 2022-06-08 NOTE — Telephone Encounter (Signed)
Transition Care Management Unsuccessful Follow-up Telephone Call  Date of discharge and from where: [facility] 06-07-22 Dx: A-fib with RVR   Attempts:  1st Attempt  Reason for unsuccessful TCM follow-up call:  Unable to leave message  Transition Care Management Follow-up Telephone Call Date of discharge and from where: [place] 06-07-22 Dx: Edrick Kins with RVR How have you been since you were released from the hospital? Doing good  Any questions or concerns? No  Items Reviewed: Did the pt receive and understand the discharge instructions provided? Yes  Medications obtained and verified? Yes  Other? No  Any new allergies since your discharge? No  Dietary orders reviewed? Yes Do you have support at home? Yes   Home Care and Equipment/Supplies: Were home health services ordered? Yes PT If so, what is the name of the agency? [facility]  Has the agency set up a time to come to the patient's home? yes Were any new equipment or medical supplies ordered?  No What is the name of the medical supply agency? [facility] Were you able to get the supplies/equipment? not applicable Do you have any questions related to the use of the equipment or supplies? No  Functional Questionnaire: (I = Independent and D = Dependent) ADLs: I  Bathing/Dressing- I  Meal Prep- I  Eating- I  Maintaining continence- I  Transferring/Ambulation- I  Managing Meds- I  Follow up appointments reviewed:  PCP Hospital f/u appt confirmed? Yes  [facility]uled [facility]ches [facility]  . Are transportation arrangements needed? No  If their condition worsens, is the pt aware to call PCP or go to the Emergency Dept.? Yes Was the patient provided with contact information for the PCP's office or ED? Yes Was to pt encouraged to call back with questions or concerns? Yes

## 2022-06-09 ENCOUNTER — Ambulatory Visit: Payer: Medicare HMO | Admitting: Pharmacist

## 2022-06-09 DIAGNOSIS — E119 Type 2 diabetes mellitus without complications: Secondary | ICD-10-CM

## 2022-06-10 ENCOUNTER — Ambulatory Visit (INDEPENDENT_AMBULATORY_CARE_PROVIDER_SITE_OTHER): Payer: Medicare HMO | Admitting: Family Medicine

## 2022-06-10 ENCOUNTER — Ambulatory Visit: Payer: Medicare HMO | Admitting: Family Medicine

## 2022-06-10 ENCOUNTER — Encounter: Payer: Self-pay | Admitting: Family Medicine

## 2022-06-10 VITALS — BP 96/61 | HR 73 | Temp 98.3°F | Ht 61.0 in | Wt 105.0 lb

## 2022-06-10 DIAGNOSIS — Z09 Encounter for follow-up examination after completed treatment for conditions other than malignant neoplasm: Secondary | ICD-10-CM

## 2022-06-10 DIAGNOSIS — I5033 Acute on chronic diastolic (congestive) heart failure: Secondary | ICD-10-CM

## 2022-06-10 DIAGNOSIS — I4891 Unspecified atrial fibrillation: Secondary | ICD-10-CM

## 2022-06-10 DIAGNOSIS — E876 Hypokalemia: Secondary | ICD-10-CM

## 2022-06-10 DIAGNOSIS — I10 Essential (primary) hypertension: Secondary | ICD-10-CM

## 2022-06-10 DIAGNOSIS — E034 Atrophy of thyroid (acquired): Secondary | ICD-10-CM

## 2022-06-10 NOTE — Progress Notes (Signed)
Subjective:  Patient ID: [name], female    DOB: 1941/08/25, 81 y.o.   MRN: [id number]  Patient Care Team: Dettinger, Fransisca Kaufmann, MD as PCP - General (Family Medicine) Skeet Latch, MD as PCP - Cardiology (Cardiology) Minus Breeding, MD (Cardiology) Clarene Essex, MD (Gastroenterology) Skeet Latch, MD as Attending Physician (Cardiology) Sandford Craze, MD as Referring Physician (Dermatology) Lavera Guise, [facility] as Pharmacist (Family Medicine)   Chief Complaint:  Hospitalization Follow-up   HPI: KARISMA MEISER is a 81 y.o. female presenting on 06/10/2022 for Hospitalization Follow-up   Pt is an 81 year old female with the past medical history of atrial fibrillation, aortic regurgitation, diastolic heart failure, and mitral stenosis who presents today for hospital discharge follow up. She was experiencing chest tightness so she went to Urgent Care on 06/03/2022. She was noted to be in A-fib with RVR and was sent to [facility] for evaluation. She was admitted for atrial fibrillation with RVR in the setting of acute on chronic diastolic CHF exacerbation.  She was diuresed, her diltiazem dose was increased for better rate control.  She was noted to have elevated free T4 and low TSH so synthroid dosing was decreased. 2D echocardiogram revealed preserved LV systolic function.  She is on Eliquis 2.5 mg twice daily and tolerating well. No abnormal bleeding or bruising. She was discharged home on 06/07/2022 and reports she has been doing well since this time. She is scheduled to see cardiology in the next few weeks.      Relevant past medical, surgical, family, and social history reviewed and updated as indicated.  Allergies and medications reviewed and updated. Data reviewed: Chart in Epic.   Past Medical History:  Diagnosis Date   A-fib [facility])    Anxiety states    Aortic regurgitation 05/24/2022   Atrial fibrillation (HCC)    Breast cyst    Cancer (HCC)    skin    Cataract    Chronic diastolic heart failure ([place]) 05/24/2022   Constipation    Esophageal stricture    Essential hypertension 12/18/2015   Fracture, rib    GERD (gastroesophageal reflux disease)    Hiatal hernia    Lower extremity edema 12/18/2015   Mitral stenosis 05/24/2022   Osteoporosis    Other and unspecified hyperlipidemia    PAC (premature atrial contraction) 12/30/2015   Palpitations 12/18/2015   Unspecified hypothyroidism    Vertebral fracture, osteoporotic ([place])     Past Surgical History:  Procedure Laterality Date   ABDOMINAL HYSTERECTOMY     APPENDECTOMY     BREAST BIOPSY     bil cysts   CATARACT EXTRACTION     ESOPHAGOGASTRODUODENOSCOPY N/A 01/19/2018   Procedure: ESOPHAGOGASTRODUODENOSCOPY (EGD);  Surgeon: Clarene Essex, MD;  Location: [facility];  Service: Endoscopy;  Laterality: N/A;  with flouro   EYE SURGERY     HAND SURGERY Left    IR GENERIC HISTORICAL  12/10/2016   IR RADIOLOGIST EVAL & MGMT 12/10/2016 [facility]   IR GENERIC HISTORICAL  12/23/2016   IR KYPHO THORACIC WITH BONE BIOPSY 12/23/2016 Luanne Bras, MD [facility]   PARTIAL HYSTERECTOMY     THYROID LOBECTOMY     right   VERTEBROPLASTY      Social History   Socioeconomic History   Marital status: Married    Spouse name: Elmer   Number of children: 0   Years of education: 8   Highest education level: 8th grade  Occupational History  Occupation: retired    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   Social  Determinants of Health   Financial Resource Strain: Medium Risk (12/11/2021)   Overall Financial Resource Strain (CARDIA)    Difficulty of Paying Living Expenses: Somewhat hard  Food Insecurity: Food Insecurity Present (12/11/2021)   Hunger Vital Sign    Worried About Running Out of Food in the Last Year: Sometimes true    Ran Out of Food in the Last Year: Never true  Transportation Needs: No Transportation Needs (12/11/2021)   PRAPARE - Hydrologist (Medical): No    Lack of Transportation (Non-Medical): No  Physical Activity: Insufficiently Active (12/11/2021)   Exercise Vital Sign    Days of Exercise per Week: 7 days    Minutes of Exercise per Session: 20 min  Stress: No Stress Concern Present (12/11/2021)   [place]    Feeling of Stress : Only a little  Social Connections: Moderately Integrated (12/11/2021)   Social Connection and Isolation Panel [NHANES]    Frequency of Communication with Friends and Family: More than three times a week    Frequency of Social Gatherings with Friends and Family: More than three times a week    Attends Religious Services: Never    Marine scientist or Organizations: Yes    Attends Music therapist: More than 4 times per year    Marital Status: Married  Human resources officer Violence: Not At Risk (12/11/2021)   Humiliation, Afraid, Rape, and Kick questionnaire    Fear of Current or Ex-Partner: No    Emotionally Abused: No    Physically Abused: No    Sexually Abused: No    Outpatient Encounter Medications as of 06/10/2022  Medication Sig   acetaminophen (TYLENOL) 500 MG tablet Take 500 mg by mouth every 8 (eight) hours as needed for mild pain or moderate pain.   albuterol (VENTOLIN HFA) 108 (90 Base) MCG/ACT inhaler Inhale 2 puffs into the lungs every 6 (six) hours as needed for wheezing or shortness of breath.   apixaban (ELIQUIS) 2.5 MG TABS  tablet Take 1 tablet (2.5 mg total) by mouth 2 (two) times daily.   Cholecalciferol (VITAMIN D3) 2000 units CHEW Chew 4,000 Units by mouth daily.   diltiazem (CARDIZEM CD) 240 MG 24 hr capsule Take 1 capsule (240 mg total) by mouth daily.   furosemide (LASIX) 20 MG tablet Take 1 tablet (20 mg total) by mouth daily.   levothyroxine (SYNTHROID) 25 MCG tablet Take 1 tablet (25 mcg total) by mouth daily.   loperamide (IMODIUM) 2 MG capsule Take 1 capsule (2 mg total) by mouth 4 (four) times daily as needed for diarrhea or loose stools.   mirtazapine (REMERON SOL-TAB) 15 MG disintegrating tablet Take 1 tablet (15 mg total) by mouth at bedtime.   Misc. Devices (COMMODE BEDSIDE) MISC Bedside  commode due to tibial fracture   Misc. Devices [facility]) MISC Pt needs wheelchair due to extremity fractures   Misc. Devices MISC Use rolling walker to ambulate around the house as needed.   mometasone (NASONEX) 50 MCG/ACT nasal spray Place 2 sprays into the nose daily.   omeprazole (PRILOSEC) 40 MG capsule Take 1 capsule (40 mg total) by mouth daily.   oxazepam (SERAX) 15 MG capsule Take 1 capsule (15 mg total) by mouth 3 (three) times daily as needed for sleep. Cancel any other prescriptions on file   polyethylene glycol (MIRALAX / GLYCOLAX) packet Take 17 g by mouth daily as needed for moderate constipation.    potassium chloride 20 MEQ/15ML (10%) SOLN TAKE 7.5ML (10MEQ) BY MOUTH DAILY AS NEEDED FOR CRAMPING (Patient taking differently: Take 10 mEq by mouth daily as needed (cramping).)   SHINGRIX injection    sucralfate (CARAFATE) 1 g tablet Take 1 tablet (1 g total) by mouth 2 (two) times daily.   triamcinolone ointment (KENALOG) 0.5 % APPLY TO THE AFFECTED AREA(S) TOPICALLY TWICE DAILY   [DISCONTINUED] metoprolol tartrate (LOPRESSOR) 25 MG tablet Take 1 tablet (25 mg total) by mouth 2 (two) times daily as needed. (Patient taking differently: Take 25 mg by mouth 2 (two) times daily as needed (irregular heart  rate).)   No facility-administered encounter medications on file as of 06/10/2022.    Allergies  Allergen Reactions   Bisphosphonates Other (See Comments)    Difficulty swallowing    Ciprofloxacin Nausea And Vomiting   Codeine Nausea And Vomiting   Sulfa Antibiotics Nausea And Vomiting    Review of Systems  Constitutional:  Negative for activity change, appetite change, chills, diaphoresis, fatigue, fever and unexpected weight change.  HENT: Negative.    Eyes: Negative.  Negative for photophobia and visual disturbance.  Respiratory:  Negative for cough, chest tightness and shortness of breath.   Cardiovascular:  Negative for chest pain, palpitations and leg swelling.  Gastrointestinal:  Negative for abdominal pain, blood in stool, constipation, diarrhea, nausea and vomiting.  Endocrine: Negative.  Negative for cold intolerance, heat intolerance, polydipsia, polyphagia and polyuria.  Genitourinary:  Negative for decreased urine volume, difficulty urinating, dysuria, frequency and urgency.  Musculoskeletal:  Positive for gait problem.  Skin: Negative.   Allergic/Immunologic: Negative.   Neurological:  Negative for dizziness, tremors, syncope, weakness, light-headedness and headaches.  Hematological: Negative.   Psychiatric/Behavioral:  Negative for confusion, hallucinations, sleep disturbance and suicidal ideas.   All other systems reviewed and are negative.       Objective:  BP 96/61   Pulse 73   Temp 98.3 F (36.8 C)   Ht 5' 1"  (1.549 m)   Wt 105 lb (47.6 kg)   SpO2 97%   BMI 19.84 kg/m    Wt Readings from Last 3 Encounters:  06/10/22 105 lb (47.6 kg)  06/07/22 108 lb 0.4 oz (49 kg)  05/24/22 105 lb 9.6 oz (47.9 kg)    Physical Exam Vitals and nursing note reviewed.  Constitutional:      General: She is not in acute distress.    Appearance: Normal appearance. She is normal weight. She is not ill-appearing, toxic-appearing or diaphoretic.  HENT:     Head:  Normocephalic and atraumatic.     Mouth/Throat:     Mouth: Mucous membranes are moist.  Eyes:     Conjunctiva/sclera: Conjunctivae normal.     Pupils: Pupils are equal, round, and reactive to light.  Cardiovascular:     Rate and  Rhythm: Normal rate. Rhythm irregularly irregular.     Heart sounds: Murmur heard.     Systolic murmur is present.  Pulmonary:     Effort: Pulmonary effort is normal.     Breath sounds: Normal breath sounds. No wheezing, rhonchi or rales.  Abdominal:     General: Bowel sounds are normal. There is no distension.     Palpations: Abdomen is soft.     Tenderness: There is no abdominal tenderness.  Musculoskeletal:     Cervical back: Neck supple.     Right lower leg: No edema.     Left lower leg: No edema.  Skin:    General: Skin is warm and dry.     Capillary Refill: Capillary refill takes less than 2 seconds.  Neurological:     General: No focal deficit present.     Mental Status: She is alert and oriented to person, place, and time.     Gait: Gait abnormal (using cane).  Psychiatric:        Mood and Affect: Mood normal.        Behavior: Behavior normal.     Results for orders placed or performed during the hospital encounter of 06/03/22  MRSA Next Gen by PCR, Nasal   Specimen: Nasal Mucosa; Nasal Swab  Result Value Ref Range   MRSA by PCR Next Gen NOT DETECTED NOT DETECTED  CBC  Result Value Ref Range   WBC 8.0 4.0 - 10.5 K/uL   RBC 3.96 3.87 - 5.11 MIL/uL   Hemoglobin 11.8 (L) 12.0 - 15.0 g/dL   HCT 37.7 36.0 - 46.0 %   MCV 95.2 80.0 - 100.0 fL   MCH 29.8 26.0 - 34.0 pg   MCHC 31.3 30.0 - 36.0 g/dL   RDW 14.9 11.5 - 15.5 %   Platelets 266 150 - 400 K/uL   nRBC 0.0 0.0 - 0.2 %  TSH  Result Value Ref Range   TSH 0.031 (L) 0.350 - 4.500 uIU/mL  Comprehensive metabolic panel  Result Value Ref Range   Sodium 140 135 - 145 mmol/L   Potassium 4.2 3.5 - 5.1 mmol/L   Chloride 106 98 - 111 mmol/L   CO2 28 22 - 32 mmol/L   Glucose, Bld 112 (H)  70 - 99 mg/dL   BUN 14 8 - 23 mg/dL   Creatinine, Ser 1.13 (H) 0.44 - 1.00 mg/dL   Calcium 8.8 (L) 8.9 - 10.3 mg/dL   Total Protein 7.2 6.5 - 8.1 g/dL   Albumin 3.5 3.5 - 5.0 g/dL   AST 19 15 - 41 U/L   ALT 11 0 - 44 U/L   Alkaline Phosphatase 99 38 - 126 U/L   Total Bilirubin 0.6 0.3 - 1.2 mg/dL   GFR, Estimated 48 (L) >60 mL/min   Anion gap 6 5 - 15  T3, free  Result Value Ref Range   T3, Free 3.2 2.0 - 4.4 pg/mL  T4, free  Result Value Ref Range   Free T4 1.43 (H) 0.61 - 1.12 ng/dL  T4  Result Value Ref Range   T4, Total 10.4 4.5 - 12.0 ug/dL  Comprehensive metabolic panel  Result Value Ref Range   Sodium 141 135 - 145 mmol/L   Potassium 4.4 3.5 - 5.1 mmol/L   Chloride 108 98 - 111 mmol/L   CO2 28 22 - 32 mmol/L   Glucose, Bld 113 (H) 70 - 99 mg/dL   BUN 13 8 - 23 mg/dL  Creatinine, Ser 0.94 0.44 - 1.00 mg/dL   Calcium 8.9 8.9 - 10.3 mg/dL   Total Protein 6.4 (L) 6.5 - 8.1 g/dL   Albumin 3.1 (L) 3.5 - 5.0 g/dL   AST 23 15 - 41 U/L   ALT 12 0 - 44 U/L   Alkaline Phosphatase 88 38 - 126 U/L   Total Bilirubin 0.7 0.3 - 1.2 mg/dL   GFR, Estimated >60 >60 mL/min   Anion gap 5 5 - 15  Magnesium  Result Value Ref Range   Magnesium 1.6 (L) 1.7 - 2.4 mg/dL  CBC with Differential/Platelet  Result Value Ref Range   WBC 8.9 4.0 - 10.5 K/uL   RBC 3.56 (L) 3.87 - 5.11 MIL/uL   Hemoglobin 10.7 (L) 12.0 - 15.0 g/dL   HCT 33.9 (L) 36.0 - 46.0 %   MCV 95.2 80.0 - 100.0 fL   MCH 30.1 26.0 - 34.0 pg   MCHC 31.6 30.0 - 36.0 g/dL   RDW 14.9 11.5 - 15.5 %   Platelets 222 150 - 400 K/uL   nRBC 0.0 0.0 - 0.2 %   Neutrophils Relative % 68 %   Neutro Abs 6.2 1.7 - 7.7 K/uL   Lymphocytes Relative 19 %   Lymphs Abs 1.7 0.7 - 4.0 K/uL   Monocytes Relative 8 %   Monocytes Absolute 0.7 0.1 - 1.0 K/uL   Eosinophils Relative 3 %   Eosinophils Absolute 0.2 0.0 - 0.5 K/uL   Basophils Relative 1 %   Basophils Absolute 0.1 0.0 - 0.1 K/uL   Immature Granulocytes 1 %   Abs Immature  Granulocytes 0.04 0.00 - 0.07 K/uL  Urinalysis, Complete w Microscopic  Result Value Ref Range   Color, Urine YELLOW YELLOW   APPearance CLEAR CLEAR   Specific Gravity, Urine 1.016 1.005 - 1.030   pH 6.0 5.0 - 8.0   Glucose, UA NEGATIVE NEGATIVE mg/dL   Hgb urine dipstick NEGATIVE NEGATIVE   Bilirubin Urine NEGATIVE NEGATIVE   Ketones, ur NEGATIVE NEGATIVE mg/dL   Protein, ur 30 (A) NEGATIVE mg/dL   Nitrite NEGATIVE NEGATIVE   Leukocytes,Ua NEGATIVE NEGATIVE   RBC / HPF 0-5 0 - 5 RBC/hpf   WBC, UA 0-5 0 - 5 WBC/hpf   Bacteria, UA RARE (A) NONE SEEN   Squamous Epithelial / LPF 0-5 0 - 5   Mucus PRESENT   Basic metabolic panel  Result Value Ref Range   Sodium 139 135 - 145 mmol/L   Potassium 4.0 3.5 - 5.1 mmol/L   Chloride 105 98 - 111 mmol/L   CO2 27 22 - 32 mmol/L   Glucose, Bld 126 (H) 70 - 99 mg/dL   BUN 14 8 - 23 mg/dL   Creatinine, Ser 1.27 (H) 0.44 - 1.00 mg/dL   Calcium 8.7 (L) 8.9 - 10.3 mg/dL   GFR, Estimated 42 (L) >60 mL/min   Anion gap 7 5 - 15  Magnesium  Result Value Ref Range   Magnesium 2.0 1.7 - 2.4 mg/dL  CBC  Result Value Ref Range   WBC 8.2 4.0 - 10.5 K/uL   RBC 3.33 (L) 3.87 - 5.11 MIL/uL   Hemoglobin 10.1 (L) 12.0 - 15.0 g/dL   HCT 32.2 (L) 36.0 - 46.0 %   MCV 96.7 80.0 - 100.0 fL   MCH 30.3 26.0 - 34.0 pg   MCHC 31.4 30.0 - 36.0 g/dL   RDW 14.9 11.5 - 15.5 %   Platelets 203 150 - 400 K/uL  nRBC 0.0 0.0 - 0.2 %  Basic metabolic panel  Result Value Ref Range   Sodium 139 135 - 145 mmol/L   Potassium 3.7 3.5 - 5.1 mmol/L   Chloride 101 98 - 111 mmol/L   CO2 29 22 - 32 mmol/L   Glucose, Bld 99 70 - 99 mg/dL   BUN 16 8 - 23 mg/dL   Creatinine, Ser 1.21 (H) 0.44 - 1.00 mg/dL   Calcium 8.7 (L) 8.9 - 10.3 mg/dL   GFR, Estimated 44 (L) >60 mL/min   Anion gap 9 5 - 15  Basic metabolic panel  Result Value Ref Range   Sodium 136 135 - 145 mmol/L   Potassium 3.2 (L) 3.5 - 5.1 mmol/L   Chloride 98 98 - 111 mmol/L   CO2 29 22 - 32 mmol/L    Glucose, Bld 102 (H) 70 - 99 mg/dL   BUN 17 8 - 23 mg/dL   Creatinine, Ser 1.16 (H) 0.44 - 1.00 mg/dL   Calcium 8.4 (L) 8.9 - 10.3 mg/dL   GFR, Estimated 47 (L) >60 mL/min   Anion gap 9 5 - 15  Magnesium  Result Value Ref Range   Magnesium 1.6 (L) 1.7 - 2.4 mg/dL  Troponin I (High Sensitivity)  Result Value Ref Range   Troponin I (High Sensitivity) 6 <18 ng/L  Troponin I (High Sensitivity)  Result Value Ref Range   Troponin I (High Sensitivity) 7 <18 ng/L     EKG: significant artifact, A-Fib, 70, QT 458 ms, no acute ST-T changes. Monia Pouch, FNP-C  Pertinent labs & imaging results that were available during my care of the patient were reviewed by me and considered in my medical decision making.  Assessment & Plan:  Abeera was seen today for hospitalization follow-up.  Diagnoses and all orders for this visit:  Hospital discharge follow-up Today's visit was for Transitional Care Management. The patient was discharged from [facility] on [facility]/07/2022 with a primary diagnosis of A-Fib with RVR.  Contact with the patient and/or caregiver, by a clinical staff member, was made on 06/08/2022 and was documented as a telephone encounter within the EMR. Through chart review and discussion with the patient I have determined that management of their condition is of high complexity.  -     EKG 12-Lead -     CBC with Differential/Platelet -     CMP14+EGFR -     Magnesium  Atrial fibrillation with RVR (HCC) Rate controlled on current regimen.  Heart rate in the 70s, blood pressure low normal.  She is on Cardizem 240 mg daily and Lopressor 25 mg twice daily.  The Lopressor is listed as as needed dosing but patient has been taken twice daily religiously.  Due to well-controlled heart rate and low normal blood pressure, will change this back to as needed dosing.  Was discussed in detail with patient and husband.  Patient aware of red flags.  We will repeat labs today. -     EKG 12-Lead -     CBC  with Differential/Platelet -     CMP14+EGFR -     Magnesium  Acute on chronic diastolic CHF (congestive heart failure) ([place]) Well compensated, no edema or shortness of breath. Will repeat labs today.  -     EKG 12-Lead -     CBC with Differential/Platelet -     CMP14+EGFR -     Magnesium  Essential hypertension Having low normal readings with HR staying in the 70 range.  Will change metoprolol 25 mg BID to 25 mg as needed for persistent HR above 100, can repeat x 1 in a 24 hour period. Pt aware to monitor BP and HR, log provided, pt to bring to next visit.  -     EKG 12-Lead -     CBC with Differential/Platelet -     CMP14+EGFR -     Magnesium  Hypothyroidism due to acquired atrophy of thyroid TSH was low and T4 elevated so Synthroid dosing was decreased to 25 mcg during hospital stay. Has not been long enough to recheck levels. Pt aware to follow up in 8 weeks for repeat labs.  Hypomagnesemia Will recheck labs today. -     Magnesium  Hypokalemia Will recheck labs today.  -     CMP14+EGFR     Continue all other maintenance medications.  Follow up plan: Return in about 2 months (around 08/10/2022), or if symptoms worsen or fail to improve, for thyroid.   Continue healthy lifestyle choices, including diet (rich in fruits, vegetables, and lean proteins, and low in salt and simple carbohydrates) and exercise (at least 30 minutes of moderate physical activity daily).   The above assessment and management plan was discussed with the patient. The patient verbalized understanding of and has agreed to the management plan. Patient is aware to call the clinic if they develop any new symptoms or if symptoms persist or worsen. Patient is aware when to return to the clinic for a follow-up visit. Patient educated on when it is appropriate to go to the emergency department.   Monia Pouch, FNP-C [facility] [phone]

## 2022-06-10 NOTE — Patient Instructions (Signed)
Stop metoprolol 25 mg twice daily. Can take once as needed if HR is above 100. Monitor BP and HR, bring log to upcoming visit.

## 2022-06-11 LAB — CBC WITH DIFFERENTIAL/PLATELET
Basophils Absolute: 0.1 10*3/uL (ref 0.0–0.2)
Basos: 1 %
EOS (ABSOLUTE): 0.4 10*3/uL (ref 0.0–0.4)
Eos: 4 %
Hematocrit: 36.4 % (ref 34.0–46.6)
Hemoglobin: 11.3 g/dL (ref 11.1–15.9)
Immature Grans (Abs): 0 10*3/uL (ref 0.0–0.1)
Immature Granulocytes: 0 %
Lymphocytes Absolute: 2 10*3/uL (ref 0.7–3.1)
Lymphs: 18 %
MCH: 29.1 pg (ref 26.6–33.0)
MCHC: 31 g/dL — ABNORMAL LOW (ref 31.5–35.7)
MCV: 94 fL (ref 79–97)
Monocytes Absolute: 0.7 10*3/uL (ref 0.1–0.9)
Monocytes: 6 %
Neutrophils Absolute: 7.8 10*3/uL — ABNORMAL HIGH (ref 1.4–7.0)
Neutrophils: 71 %
Platelets: 333 10*3/uL (ref 150–450)
RBC: 3.88 x10E6/uL (ref 3.77–5.28)
RDW: 13.8 % (ref 11.7–15.4)
WBC: 10.9 10*3/uL — ABNORMAL HIGH (ref 3.4–10.8)

## 2022-06-11 LAB — CMP14+EGFR
ALT: 9 IU/L (ref 0–32)
AST: 15 IU/L (ref 0–40)
Albumin/Globulin Ratio: 1.3 (ref 1.2–2.2)
Albumin: 3.8 g/dL (ref 3.7–4.7)
Alkaline Phosphatase: 112 IU/L (ref 44–121)
BUN/Creatinine Ratio: 13 (ref 12–28)
BUN: 15 mg/dL (ref 8–27)
Bilirubin Total: 0.5 mg/dL (ref 0.0–1.2)
CO2: 23 mmol/L (ref 20–29)
Calcium: 9.5 mg/dL (ref 8.7–10.3)
Chloride: 100 mmol/L (ref 96–106)
Creatinine, Ser: 1.2 mg/dL — ABNORMAL HIGH (ref 0.57–1.00)
Globulin, Total: 3 g/dL (ref 1.5–4.5)
Glucose: 99 mg/dL (ref 70–99)
Potassium: 4.3 mmol/L (ref 3.5–5.2)
Sodium: 142 mmol/L (ref 134–144)
Total Protein: 6.8 g/dL (ref 6.0–8.5)
eGFR: 45 mL/min/{1.73_m2} — ABNORMAL LOW (ref >59)

## 2022-06-11 LAB — MAGNESIUM: Magnesium: 2 mg/dL (ref 1.6–2.3)

## 2022-06-15 ENCOUNTER — Ambulatory Visit (INDEPENDENT_AMBULATORY_CARE_PROVIDER_SITE_OTHER): Payer: Medicare HMO

## 2022-06-15 ENCOUNTER — Ambulatory Visit (INDEPENDENT_AMBULATORY_CARE_PROVIDER_SITE_OTHER): Payer: Medicare HMO | Admitting: Orthopaedic Surgery

## 2022-06-15 ENCOUNTER — Encounter: Payer: Self-pay | Admitting: Orthopaedic Surgery

## 2022-06-15 DIAGNOSIS — S42202D Unspecified fracture of upper end of left humerus, subsequent encounter for fracture with routine healing: Secondary | ICD-10-CM

## 2022-06-15 NOTE — Progress Notes (Signed)
My shoulder is not as sore.  She was in the hospital for five days just released Monday.  She has heart problems.  She is moving the left shoulder better.  PT has worked with her.  I will continue home health PT.  She has no new trauma to the shoulder.  Left shoulder abducts to 90, forward 110.  She cannot yet put hand behind head to comb yet.  NV intact.  Encounter Diagnosis  Name Primary?   Closed traumatic nondisplaced fracture of proximal end of left humerus with routine healing, subsequent encounter Yes   X-rays were done of the left shoulder, reported separately.  Today is her 54th wedding anniversary.  Her husband accompanies her.  Return in three weeks.  X-rays on return.  Call if any problem.  Precautions discussed.  Electronically Signed Sanjuana Kava, MD 8/29/202310:21 AM

## 2022-06-16 ENCOUNTER — Telehealth: Payer: Self-pay | Admitting: *Deleted

## 2022-06-16 NOTE — Telephone Encounter (Signed)
VM from [facility]/ [facility] Saw pt this week, reporting 7lb wgt gain since last week when he saw her, w/ 4+ pitting edema in ankles, O2 sats down from normal, she is on a small dose of Lasix.

## 2022-06-17 NOTE — Progress Notes (Signed)
  Care Management   Follow Up Note   06/09/2022 Name: [name] MRN: [id number] DOB: 1941-04-29   Referred by: Dettinger, Fransisca Kaufmann, MD Reason for referral : Chronic Care Management and Atrial Fibrillation  Patient did not get approved for LIS/extra Help with medicare.  She does not currently qualify for eliquis patient assistance. Samples given   Follow Up Plan: The patient has been provided with contact information for the care management team and has been advised to call with any health related questions or concerns.     Regina Eck, PharmD, BCPS Clinical Pharmacist, [place]  II Phone [phone]

## 2022-06-23 NOTE — Progress Notes (Signed)
Cardiology Office Note:    Date:  06/29/2022   ID:  CLOA BUSHONG, DOB 05/28/1941, MRN [id number]  PCP:  Dettinger, Fransisca Kaufmann, MD  [place] Cardiologist:  Madeline Latch, MD     Referring MD: Dettinger, Fransisca Kaufmann, MD   Chief Complaint:  Hospitalization Follow-up     History of Present Illness:   [name] is a 81 y.o. 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  Patient was seen in the hospital 06/04/2022 for the evaluation of atrial fibrillation with RVR and placed on diltiazem 240 mg daily. Metoprolol 25 mg bid. Plan for rate control.  Patient comes in with her husband. When she took a shower the other night her heart rate got up to 139/m.  She took her evening dose of metoprolol 25 mg and it came down to the 60's. She feels weak and trying to get her strength back-doing PT.    Past Medical History:  Diagnosis Date   A-fib [facility])    Anxiety states    Aortic regurgitation 05/24/2022   Atrial fibrillation (HCC)    Breast cyst    Cancer (HCC)    skin   Cataract    Chronic diastolic heart failure ([place]) 05/24/2022   Constipation    Esophageal stricture    Essential hypertension 12/18/2015   Fracture, rib    GERD (gastroesophageal reflux disease)    Hiatal hernia    Lower extremity edema 12/18/2015   Mitral stenosis 05/24/2022   Osteoporosis    Other and unspecified hyperlipidemia    PAC (premature atrial contraction) 12/30/2015   Palpitations 12/18/2015   Unspecified hypothyroidism    Vertebral fracture, osteoporotic (HCC)    Current Medications: Current Meds  Medication Sig   acetaminophen (TYLENOL) 500 MG tablet Take 500 mg by mouth every 8 (eight) hours as needed for mild pain or moderate pain.   albuterol (VENTOLIN HFA) 108 (90 Base)  MCG/ACT inhaler Inhale 2 puffs into the lungs every 6 (six) hours as needed for wheezing or shortness of breath.   apixaban (ELIQUIS) 2.5 MG TABS tablet Take 1 tablet (2.5 mg total) by mouth 2 (two) times daily.   Cholecalciferol (VITAMIN D3) 2000 units CHEW Chew 4,000 Units by mouth daily.   diltiazem (CARDIZEM CD) 240 MG 24 hr capsule Take 1 capsule (240 mg total) by mouth daily.   furosemide (LASIX) 20 MG tablet Take 1 tablet (20 mg total) by mouth daily.   levothyroxine (SYNTHROID) 25 MCG tablet Take 1 tablet (25 mcg total) by mouth daily.   loperamide (IMODIUM) 2 MG capsule Take 1 capsule (2 mg total) by mouth 4 (four) times daily as needed for diarrhea or loose stools.   metoprolol tartrate (LOPRESSOR) 25 MG tablet Take 25 mg by mouth 2 (two) times daily.   mirtazapine (REMERON SOL-TAB) 15 MG disintegrating tablet Take 1 tablet (15 mg total) by mouth at bedtime.   Misc. Devices (COMMODE BEDSIDE) MISC Bedside commode due to tibial fracture   Misc. Devices [facility]) MISC Pt needs wheelchair due to extremity fractures   Misc. Devices MISC Use rolling walker to ambulate around the house as needed.   mometasone (NASONEX) 50 MCG/ACT nasal spray Place 2 sprays into the nose daily.   omeprazole (PRILOSEC) 40 MG capsule Take 1 capsule (40 mg  total) by mouth daily.   oxazepam (SERAX) 15 MG capsule Take 1 capsule (15 mg total) by mouth 3 (three) times daily as needed for sleep. Cancel any other prescriptions on file   polyethylene glycol (MIRALAX / GLYCOLAX) packet Take 17 g by mouth daily as needed for moderate constipation.    potassium chloride 20 MEQ/15ML (10%) SOLN TAKE 7.5ML (10MEQ) BY MOUTH DAILY AS NEEDED FOR CRAMPING (Patient taking differently: Take 10 mEq by mouth daily as needed (cramping).)   sucralfate (CARAFATE) 1 g tablet Take 1 tablet (1 g total) by mouth 2 (two) times daily.   triamcinolone ointment (KENALOG) 0.5 % APPLY TO THE AFFECTED AREA(S) TOPICALLY TWICE DAILY    Allergies:    Bisphosphonates, Ciprofloxacin, Codeine, and Sulfa antibiotics   Social History   Tobacco Use   Smoking status: Former    Types: Cigarettes    Quit date: 11/18/2005    Years since quitting: 16.6   Smokeless tobacco: Never   Tobacco comments:    Non-smoker  quit 12-13 years  Vaping Use   Vaping Use: Never used  Substance Use Topics   Alcohol use: No   Drug use: No    Family Hx: The patient's family history includes Alcohol abuse in her father; Cancer in her brother, brother, and sister; Dementia in her brother; Early death in her brother; Goiter in her brother and father; Heart disease in her brother and sister; Hernia in her brother; Hyperlipidemia in her brother.  ROS   EKGs/Labs/Other Test Reviewed:    EKG:  EKG is  not ordered today.    Recent Labs: 06/03/2022: TSH 0.031 06/10/2022: ALT 9; BUN 15; Creatinine, Ser 1.20; Hemoglobin 11.3; Magnesium 2.0; Platelets 333; Potassium 4.3; Sodium 142   Recent Lipid Panel Recent Labs    11/27/21 1135  CHOL 145  TRIG 134  HDL 42  LDLCALC 79     Prior CV Studies: ECHO COMPLETE WO IMAGING ENHANCING AGENT 03/27/2022  Narrative ECHOCARDIOGRAM REPORT    Patient Name:   [name] Date of Exam: 03/27/2022 Medical Rec #:  [id number]        Height:       61.0 in Accession #:    [id number]       Weight:       107.0 lb Date of Birth:  11-22-1940         BSA:          1.448 m Patient Age:    8 years         BP:           129/95 mmHg Patient Gender: F                HR:           88 bpm. Exam Location:  [facility]  Procedure: 2D Echo, Cardiac Doppler and Color Doppler  Indications:    Atrial Fibrillation I48.91  History:        Patient has prior history of Echocardiogram examinations, most recent 04/13/2017. Arrythmias:PAC and Palpitations; Risk Factors:Hypertension and Dyslipidemia. GERD.  Sonographer:    Alvino Chapel RCS Referring Phys: [place]   1. Left ventricular ejection fraction,  by estimation, is 60 to 65%. The left ventricle has normal function. The left ventricle has no regional wall motion abnormalities. Left ventricular diastolic parameters are consistent with Grade III diastolic dysfunction (restrictive). 2. Right ventricular systolic function is normal. The right ventricular size is normal. There is  mildly elevated pulmonary artery systolic pressure. 3. Left atrial size was severely dilated. 4. Right atrial size was mildly dilated. 5. 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 6. Tricuspid valve regurgitation is moderate. 7. The aortic valve is tricuspid. Aortic valve regurgitation is severe. No aortic stenosis is present. 8. The inferior vena cava is normal in size with greater than 50% respiratory variability, suggesting right atrial pressure of 3 mmHg.  FINDINGS Left Ventricle: Left ventricular ejection fraction, by estimation, is 60 to 65%. The left ventricle has normal function. The left ventricle has no regional wall motion abnormalities. The left ventricular internal cavity size was normal in size. There is no left ventricular hypertrophy. Left ventricular diastolic parameters are consistent with Grade III diastolic dysfunction (restrictive).  Right Ventricle: The right ventricular size is normal. Right ventricular systolic function is normal. There is mildly elevated pulmonary artery systolic pressure. The tricuspid regurgitant velocity is 3.12 m/s, and with an assumed right atrial pressure of 3 mmHg, the estimated right ventricular systolic pressure is 35.0 mmHg.  Left Atrium: Left atrial size was severely dilated.  Right Atrium: Right atrial size was mildly dilated.  Pericardium: There is no evidence of pericardial effusion.  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 Mild mitral valve stenosis. MV peak gradient, 17.1 mmHg. The mean mitral valve gradient is 6.0  mmHg.  Tricuspid Valve: The tricuspid valve is normal in structure. Tricuspid valve regurgitation is moderate . No evidence of tricuspid stenosis.  Aortic Valve: The aortic valve is tricuspid. Aortic valve regurgitation is severe. Aortic regurgitation PHT measures 382 msec. No aortic stenosis is present.  Pulmonic Valve: The pulmonic valve was not well visualized. Pulmonic valve regurgitation is mild. No evidence of pulmonic stenosis.  Aorta: The aortic root is normal in size and structure.  Venous: The inferior vena cava is normal in size with greater than 50% respiratory variability, suggesting right atrial pressure of 3 mmHg.  IAS/Shunts: No atrial level shunt detected by color flow Doppler.   LEFT VENTRICLE PLAX 2D LVIDd:         3.70 cm   Diastology LVIDs:         2.60 cm   LV e' medial:    5.44 cm/s LV PW:         0.90 cm   LV E/e' medial:  36.9 LV IVS:        0.90 cm   LV e' lateral:   6.74 cm/s LVOT diam:     1.90 cm   LV E/e' lateral: 29.8 LV SV:         57 LV SV Index:   39 LVOT Area:     2.84 cm   RIGHT VENTRICLE RV S prime:     9.68 cm/s TAPSE (M-mode): 1.9 cm  LEFT ATRIUM             Index        RIGHT ATRIUM           Index LA diam:        3.90 cm 2.69 cm/m   RA Area:     17.60 cm LA Vol (A2C):   72.9 ml 50.35 ml/m  RA Volume:   51.50 ml  35.57 ml/m LA Vol (A4C):   85.4 ml 58.98 ml/m LA Biplane Vol: 80.1 ml 55.32 ml/m AORTIC VALVE LVOT Vmax:   110.00 cm/s LVOT Vmean:  70.000 cm/s LVOT VTI:    0.201 m  AI PHT:      382 msec  AORTA Ao Root diam: 3.00 cm  MITRAL VALVE                TRICUSPID VALVE MV Area (PHT): 2.54 cm     TR Peak grad:   38.9 mmHg MV Area VTI:   1.12 cm     TR Vmax:        312.00 cm/s MV Peak grad:  17.1 mmHg MV Mean grad:  6.0 mmHg     SHUNTS MV Vmax:       2.07 m/s     Systemic VTI:  0.20 m MV Vmean:      113.0 cm/s   Systemic Diam: 1.90 cm MV Decel Time: 299 msec MR Peak grad: 63.7 mmHg MR Mean grad: 44.0 mmHg MR Vmax:       399.00 cm/s MR Vmean:     316.0 cm/s MV E velocity: 201.00 cm/s MV A velocity: 55.50 cm/s MV E/A ratio:  3.62  Kirk Ruths MD Electronically signed by Kirk Ruths MD Signature Date/Time: 03/27/2022/12:30:35 PM    Final     a hx of paroxysmal atrial fibrillation (documented during admission in 03/2022 and converted to NSR with IV Cardizem), valvular heart disease (echo in 03/2022 showing mild MR, mild MS and severe AI)  COPD, Hypothyroidism and GERD     Risk Assessment/Calculations/Metrics:    CHA2DS2-VASc Score = 4   This indicates a 4.8% annual risk of stroke. The patient's score is based upon: CHF History: 0 HTN History: 1 Diabetes History: 0 Stroke History: 0 Vascular Disease History: 0 Age Score: 2 Gender Score: 1             Physical Exam:    VS:  BP 108/68   Pulse 61   Ht '5\' 1"'$  (1.549 m)   Wt 109 lb (49.4 kg)   SpO2 94%   BMI 20.60 kg/m     Wt Readings from Last 3 Encounters:  06/29/22 109 lb (49.4 kg)  06/10/22 105 lb (47.6 kg)  06/07/22 108 lb 0.4 oz (49 kg)    Physical Exam , GEN: Thin, elderly in no acute distress  Neck: no JVD, carotid bruits, or masses Cardiac: irreg irreg; 2/6 diastolic murmur LSB Respiratory:  decreased breath sounds throughout. clear to auscultation bilaterally, normal work of breathing GI: soft, nontender, nondistended, + BS Ext: without cyanosis, clubbing, or edema, Good distal pulses bilaterally Neuro:  Alert and Oriented x 3 Psych: euthymic mood, full affect       ASSESSMENT & PLAN:   No problem-specific Assessment & Plan notes found for this encounter.    Afib now rate controlled - history of PAF, LAVI 55 mainting SR would be difficult, rate control best option at this time - medical therapy with dilt 240 mg daily, lopressor '25mg'$  bid, eliquis 2.'5mg'$  bid.  - rates are 60's on this regimen, bps are normal      Valvular heart diseaes - - Echo in 03/2022 showed mild MR, mild MS and severe AI. LVE 60-65%,  LVIDs 2.6. She is scheduled for a follow-up echocardiogram in 09/2022 with Dr. Oval Linsey at [facility]. Will see if Dr. Percival Spanish has availability in [place] as it's hard for them to drive there.   HFpEF - 03/2022 echo LVE 60-5%, no WMAs, grade III dd. NOrmal RV function, severe LAE LAVI 55 - euvolemic today, can continue home diuretic dose -some leg swelling, should wear compression hose, - 2 gm sodium diet.  Dispo:  No follow-ups on file.   Medication Adjustments/Labs and Tests Ordered: Current medicines are reviewed at length with the patient today.  Concerns regarding medicines are outlined above.  Tests Ordered: No orders of the defined types were placed in this encounter.  Medication Changes: No orders of the defined types were placed in this encounter.  Sumner Boast, PA-C  06/29/2022 10:15 AM    [address], [place], [place]  [address] Phone: [phone]; Fax: [phone]

## 2022-06-29 ENCOUNTER — Encounter: Payer: Self-pay | Admitting: Physician Assistant

## 2022-06-29 ENCOUNTER — Ambulatory Visit: Payer: Medicare HMO | Attending: Physician Assistant | Admitting: Physician Assistant

## 2022-06-29 VITALS — BP 108/68 | HR 61 | Ht 61.0 in | Wt 109.0 lb

## 2022-06-29 DIAGNOSIS — I050 Rheumatic mitral stenosis: Secondary | ICD-10-CM

## 2022-06-29 DIAGNOSIS — I351 Nonrheumatic aortic (valve) insufficiency: Secondary | ICD-10-CM | POA: Diagnosis not present

## 2022-06-29 DIAGNOSIS — I5030 Unspecified diastolic (congestive) heart failure: Secondary | ICD-10-CM

## 2022-06-29 DIAGNOSIS — I4819 Other persistent atrial fibrillation: Secondary | ICD-10-CM

## 2022-06-29 NOTE — Patient Instructions (Addendum)
Medication Instructions:  Your physician recommends that you continue on your current medications as directed. Please refer to the Current Medication list given to you today.  *If you need a refill on your cardiac medications before your next appointment, please call your pharmacy*   Lab Work: None If you have labs (blood work) drawn today and your tests are completely normal, you will receive your results only by: [place] (if you have MyChart) OR A paper copy in the mail If you have any lab test that is abnormal or we need to change your treatment, we will call you to review the results.  Follow-Up: At [facility], you and your health needs are our priority.  As part of our continuing mission to provide you with exceptional heart care, we have created designated Provider Care Teams.  These Care Teams include your primary Cardiologist (physician) and Advanced Practice Providers (APPs -  Physician Assistants and Nurse Practitioners) who all work together to provide you with the care you need, when you need it.  We recommend signing up for the patient portal called "MyChart".  Sign up information is provided on this After Visit Summary.  MyChart is used to connect with patients for Virtual Visits (Telemedicine).  Patients are able to view lab/test results, encounter notes, upcoming appointments, etc.  Non-urgent messages can be sent to your provider as well.   To learn more about what you can do with MyChart, go to [email or web address].    Your next appointment:   10/06/22 at 1:40 PM  The format for your next appointment:   In Person  Provider:   Minus Breeding, MD   Two Gram Sodium Diet 2000 mg  What is Sodium? Sodium is a mineral found naturally in many foods. The most significant source of sodium in the diet is table salt, which is about 40% sodium.  Processed, convenience, and preserved foods also contain a large amount of sodium.  The body needs only 500 mg of  sodium daily to function,  A normal diet provides more than enough sodium even if you do not use salt.  Why Limit Sodium? A build up of sodium in the body can cause thirst, increased blood pressure, shortness of breath, and water retention.  Decreasing sodium in the diet can reduce edema and risk of heart attack or stroke associated with high blood pressure.  Keep in mind that there are many other factors involved in these health problems.  Heredity, obesity, lack of exercise, cigarette smoking, stress and what you eat all play a role.  General Guidelines: Do not add salt at the table or in cooking.  One teaspoon of salt contains over 2 grams of sodium. Read food labels Avoid processed and convenience foods Ask your dietitian before eating any foods not dicussed in the menu planning guidelines Consult your physician if you wish to use a salt substitute or a sodium containing medication such as antacids.  Limit milk and milk products to 16 oz (2 cups) per day.  Shopping Hints: READ LABELS!! "Dietetic" does not necessarily mean low sodium. Salt and other sodium ingredients are often added to foods during processing.    Menu Planning Guidelines Food Group Choose More Often Avoid  Beverages (see also the milk group All fruit juices, low-sodium, salt-free vegetables juices, low-sodium carbonated beverages Regular vegetable or tomato juices, commercially softened water used for drinking or cooking  Breads and Cereals Enriched white, wheat, rye and pumpernickel bread, hard rolls and dinner rolls; muffins,  cornbread and waffles; most dry cereals, cooked cereal without added salt; unsalted crackers and breadsticks; low sodium or homemade bread crumbs Bread, rolls and crackers with salted tops; quick breads; instant hot cereals; pancakes; commercial bread stuffing; self-rising flower and biscuit mixes; regular bread crumbs or cracker crumbs  Desserts and Sweets Desserts and sweets mad with mild should be  within allowance Instant pudding mixes and cake mixes  Fats Butter or margarine; vegetable oils; unsalted salad dressings, regular salad dressings limited to 1 Tbs; light, sour and heavy cream Regular salad dressings containing bacon fat, bacon bits, and salt pork; snack dips made with instant soup mixes or processed cheese; salted nuts  Fruits Most fresh, frozen and canned fruits Fruits processed with salt or sodium-containing ingredient (some dried fruits are processed with sodium sulfites        Vegetables Fresh, frozen vegetables and low- sodium canned vegetables Regular canned vegetables, sauerkraut, pickled vegetables, and others prepared in brine; frozen vegetables in sauces; vegetables seasoned with ham, bacon or salt pork  Condiments, Sauces, Miscellaneous  Salt substitute with physician's approval; pepper, herbs, spices; vinegar, lemon or lime juice; hot pepper sauce; garlic powder, onion powder, low sodium soy sauce (1 Tbs.); low sodium condiments (ketchup, chili sauce, mustard) in limited amounts (1 tsp.) fresh ground horseradish; unsalted tortilla chips, pretzels, potato chips, popcorn, salsa (1/4 cup) Any seasoning made with salt including garlic salt, celery salt, onion salt, and seasoned salt; sea salt, rock salt, kosher salt; meat tenderizers; monosodium glutamate; mustard, regular soy sauce, barbecue, sauce, chili sauce, teriyaki sauce, steak sauce, Worcestershire sauce, and most flavored vinegars; canned gravy and mixes; regular condiments; salted snack foods, olives, picles, relish, horseradish sauce, catsup   Food preparation: Try these seasonings Meats:    Pork Sage, onion Serve with applesauce  Chicken Poultry seasoning, thyme, parsley Serve with cranberry sauce  Lamb Curry powder, rosemary, garlic, thyme Serve with mint sauce or jelly  Veal Marjoram, basil Serve with current jelly, cranberry sauce  Beef Pepper, bay leaf Serve with dry mustard, unsalted chive butter  Fish  Bay leaf, dill Serve with unsalted lemon butter, unsalted parsley butter  Vegetables:    Asparagus Lemon juice   Broccoli Lemon juice   Carrots Mustard dressing parsley, mint, nutmeg, glazed with unsalted butter and sugar   Green beans Marjoram, lemon juice, nutmeg,dill seed   Tomatoes Basil, marjoram, onion   Spice /blend for [employer]" 4 tsp ground thyme 1 tsp ground sage 3 tsp ground rosemary 4 tsp ground marjoram   Test your knowledge A product that says "Salt Free" may still contain sodium. True or False Garlic Powder and Hot Pepper Sauce an be used as alternative seasonings.True or False Processed foods have more sodium than fresh foods.  True or False Canned Vegetables have less sodium than froze True or False   WAYS TO DECREASE YOUR SODIUM INTAKE Avoid the use of added salt in cooking and at the table.  Table salt (and other prepared seasonings which contain salt) is probably one of the greatest sources of sodium in the diet.  Unsalted foods can gain flavor from the sweet, sour, and butter taste sensations of herbs and spices.  Instead of using salt for seasoning, try the following seasonings with the foods listed.  Remember: how you use them to enhance natural food flavors is limited only by your creativity... Allspice-Meat, fish, eggs, fruit, peas, red and yellow vegetables Almond Extract-Fruit baked goods Anise Seed-Sweet breads, fruit, carrots, beets, cottage cheese, cookies (tastes  like licorice) Basil-Meat, fish, eggs, vegetables, rice, vegetables salads, soups, sauces Bay Leaf-Meat, fish, stews, poultry Burnet-Salad, vegetables (cucumber-like flavor) Caraway Seed-Bread, cookies, cottage cheese, meat, vegetables, cheese, rice Cardamon-Baked goods, fruit, soups Celery Powder or seed-Salads, salad dressings, sauces, meatloaf, soup, bread.Do not use  celery salt Chervil-Meats, salads, fish, eggs, vegetables, cottage cheese (parsley-like flavor) Chili Power-Meatloaf,  chicken cheese, corn, eggplant, egg dishes Chives-Salads cottage cheese, egg dishes, soups, vegetables, sauces Cilantro-Salsa, casseroles Cinnamon-Baked goods, fruit, pork, lamb, chicken, carrots Cloves-Fruit, baked goods, fish, pot roast, green beans, beets, carrots Coriander-Pastry, cookies, meat, salads, cheese (lemon-orange flavor) Cumin-Meatloaf, fish,cheese, eggs, cabbage,fruit pie (caraway flavor) [employer], fruit, eggs, fish, poultry, cottage cheese, vegetables Dill Seed-Meat, cottage cheese, poultry, vegetables, fish, salads, bread Fennel Seed-Bread, cookies, apples, pork, eggs, fish, beets, cabbage, cheese, Licorice-like flavor Garlic-(buds or powder) Salads, meat, poultry, fish, bread, butter, vegetables, potatoes.Do not  use garlic salt Ginger-Fruit, vegetables, baked goods, meat, fish, poultry Horseradish Root-Meet, vegetables, butter Lemon Juice or Extract-Vegetables, fruit, tea, baked goods, fish salads Mace-Baked goods fruit, vegetables, fish, poultry (taste like nutmeg) Maple Extract-Syrups Marjoram-Meat, chicken, fish, vegetables, breads, green salads (taste like Sage) Mint-Tea, lamb, sherbet, vegetables, desserts, carrots, cabbage Mustard, Dry or Seed-Cheese, eggs, meats, vegetables, poultry Nutmeg-Baked goods, fruit, chicken, eggs, vegetables, desserts Onion Powder-Meat, fish, poultry, vegetables, cheese, eggs, bread, rice salads (Do not use   Onion salt) Orange Extract-Desserts, baked goods Oregano-Pasta, eggs, cheese, onions, pork, lamb, fish, chicken, vegetables, green salads Paprika-Meat, fish, poultry, eggs, cheese, vegetables Parsley Flakes-Butter, vegetables, meat fish, poultry, eggs, bread, salads (certain forms may   Contain sodium Pepper-Meat fish, poultry, vegetables, eggs Peppermint Extract-Desserts, baked goods Poppy Seed-Eggs, bread, cheese, fruit dressings, baked goods, noodles, vegetables, cottage  [employer], poultry, meat, fish, cauliflower, turnips,eggs bread Saffron-Rice, bread, veal, chicken, fish, eggs Sage-Meat, fish, poultry, onions, eggplant, tomateos, pork, stews Savory-Eggs, salads, poultry, meat, rice, vegetables, soups, pork Tarragon-Meat, poultry, fish, eggs, butter, vegetables (licorice-like flavor)  Thyme-Meat, poultry, fish, eggs, vegetables, (clover-like flavor), sauces, soups Tumeric-Salads, butter, eggs, fish, rice, vegetables (saffron-like flavor) Vanilla Extract-Baked goods, candy Vinegar-Salads, vegetables, meat marinades Walnut Extract-baked goods, candy   2. Choose your Foods Wisely   The following is a list of foods to avoid which are high in sodium:  Meats-Avoid all smoked, canned, salt cured, dried and kosher meat and fish as well as Anchovies   [employer] meats:Bologna, Liverwurst, Pastrami Canned meat or fish  Marinated herring Caviar    Pepperoni Corned Beef   Pizza Dried chipped beef  Salami Frozen breaded fish or meat Salt pork Frankfurters or hot dogs  Sardines Gefilte fish   Sausage Ham (boiled ham, Proscuitto Smoked butt    spiced ham)   Spam      TV Dinners Vegetables Canned vegetables (Regular) Relish Canned mushrooms  Sauerkraut Olives    Tomato juice Pickles  Bakery and Dessert Products Canned puddings  Cream pies Cheesecake   Decorated cakes Cookies  Beverages/Juices Tomato juice, regular  Gatorade   V-8 vegetable juice, regular  Breads and Cereals Biscuit mixes   Salted potato chips, corn chips, pretzels Bread stuffing mixes  Salted crackers and rolls Pancake and waffle mixes Self-rising flour  Seasonings Accent    Meat sauces Barbecue sauce  Meat tenderizer Catsup    Monosodium glutamate (MSG) Celery salt   Onion salt Chili sauce   Prepared mustard Garlic salt   Salt, seasoned salt, sea salt Gravy mixes   Soy sauce Horseradish   Steak sauce  Ketchup   Tartar sauce Lite  salt    Teriyaki sauce Marinade mixes   Worcestershire sauce  Others Baking powder   Cocoa and cocoa mixes Baking soda   Commercial casserole mixes Candy-caramels, chocolate  Dehydrated soups    Bars, fudge,nougats  Instant rice and pasta mixes Canned broth or soup  Maraschino cherries Cheese, aged and processed cheese and cheese spreads  Learning Assessment Quiz  Indicated T (for True) or F (for False) for each of the following statements:  _____ Fresh fruits and vegetables and unprocessed grains are generally low in sodium _____ Water may contain a considerable amount of sodium, depending on the source _____ You can always tell if a food is high in sodium by tasting it _____ Certain laxatives my be high in sodium and should be avoided unless prescribed   by a physician or pharmacist _____ Salt substitutes may be used freely by anyone on a sodium restricted diet _____ Sodium is present in table salt, food additives and as a natural component of   most foods _____ Table salt is approximately 90% sodium _____ Limiting sodium intake may help prevent excess fluid accumulation in the body _____ On a sodium-restricted diet, seasonings such as bouillon soy sauce, and    cooking wine should be used in place of table salt _____ On an ingredient list, a product which lists monosodium glutamate as the first   ingredient is an appropriate food to include on a low sodium diet  Circle the best answer(s) to the following statements (Hint: there may be more than one correct answer)  11. On a low-sodium diet, some acceptable snack items are:    A. Olives  F. Bean dip   K. Grapefruit juice    B. Salted Pretzels G. Commercial Popcorn   L. Canned peaches    C. Carrot Sticks  H. Bouillon   M. Unsalted nuts   D. [place] fries  I. Peanut butter crackers N. Salami   E. Sweet pickles J. Tomato Juice   O. Pizza  12.  Seasonings that may be used freely on a reduced - sodium diet include   A. Lemon  wedges F.Monosodium glutamate K. Celery seed    B.Soysauce   G. Pepper   L. Mustard powder   C. Sea salt  H. Cooking wine  M. Onion flakes   D. Vinegar  E. Prepared horseradish N. Salsa   E. Sage   J. Worcestershire sauce  O. Chutney   Important Information About Sugar

## 2022-06-30 ENCOUNTER — Ambulatory Visit (INDEPENDENT_AMBULATORY_CARE_PROVIDER_SITE_OTHER): Payer: Medicare HMO

## 2022-06-30 DIAGNOSIS — J439 Emphysema, unspecified: Secondary | ICD-10-CM | POA: Diagnosis not present

## 2022-06-30 DIAGNOSIS — E559 Vitamin D deficiency, unspecified: Secondary | ICD-10-CM | POA: Diagnosis not present

## 2022-06-30 DIAGNOSIS — E7849 Other hyperlipidemia: Secondary | ICD-10-CM

## 2022-06-30 DIAGNOSIS — I088 Other rheumatic multiple valve diseases: Secondary | ICD-10-CM | POA: Diagnosis not present

## 2022-06-30 DIAGNOSIS — I4891 Unspecified atrial fibrillation: Secondary | ICD-10-CM

## 2022-06-30 DIAGNOSIS — F411 Generalized anxiety disorder: Secondary | ICD-10-CM

## 2022-06-30 DIAGNOSIS — I491 Atrial premature depolarization: Secondary | ICD-10-CM | POA: Diagnosis not present

## 2022-06-30 DIAGNOSIS — I700 Atherosclerosis of aorta: Secondary | ICD-10-CM | POA: Diagnosis not present

## 2022-06-30 DIAGNOSIS — I119 Hypertensive heart disease without heart failure: Secondary | ICD-10-CM

## 2022-06-30 DIAGNOSIS — E039 Hypothyroidism, unspecified: Secondary | ICD-10-CM | POA: Diagnosis not present

## 2022-06-30 DIAGNOSIS — K219 Gastro-esophageal reflux disease without esophagitis: Secondary | ICD-10-CM

## 2022-06-30 DIAGNOSIS — S42302D Unspecified fracture of shaft of humerus, left arm, subsequent encounter for fracture with routine healing: Secondary | ICD-10-CM | POA: Diagnosis not present

## 2022-06-30 DIAGNOSIS — M810 Age-related osteoporosis without current pathological fracture: Secondary | ICD-10-CM

## 2022-07-02 ENCOUNTER — Other Ambulatory Visit: Payer: Self-pay | Admitting: Family Medicine

## 2022-07-02 DIAGNOSIS — F411 Generalized anxiety disorder: Secondary | ICD-10-CM

## 2022-07-06 ENCOUNTER — Ambulatory Visit (INDEPENDENT_AMBULATORY_CARE_PROVIDER_SITE_OTHER): Payer: Medicare HMO

## 2022-07-06 ENCOUNTER — Encounter: Payer: Self-pay | Admitting: Orthopaedic Surgery

## 2022-07-06 ENCOUNTER — Ambulatory Visit (INDEPENDENT_AMBULATORY_CARE_PROVIDER_SITE_OTHER): Payer: Medicare HMO | Admitting: Orthopaedic Surgery

## 2022-07-06 DIAGNOSIS — S42202D Unspecified fracture of upper end of left humerus, subsequent encounter for fracture with routine healing: Secondary | ICD-10-CM

## 2022-07-06 NOTE — Progress Notes (Signed)
My shoulder is better.  She has better ROM of the left shoulder. She can touch the back of her head with no problem.  She was hospitalized for heart problems since I saw her.  X-rays were done of the left shoulder, reported separately.  Encounter Diagnosis  Name Primary?   Closed traumatic nondisplaced fracture of proximal end of left humerus with routine healing, subsequent encounter Yes   Return in one month.  Continue the exercises.  Call if any problem.  Precautions discussed.  Electronically Signed Sanjuana Kava, MD 9/19/202311:01 AM

## 2022-07-08 ENCOUNTER — Other Ambulatory Visit: Payer: Self-pay | Admitting: Family Medicine

## 2022-07-08 NOTE — Telephone Encounter (Signed)
Pt requesting this rx because she gets sick in the mornings. She got the rx from the hospital.

## 2022-07-08 NOTE — Telephone Encounter (Signed)
Was DCd from med list on 06/03/22 Last OV 06/10/22 Next OV 07/28/22 Please advise

## 2022-07-08 NOTE — Telephone Encounter (Signed)
LMOVM refill sent to pharmacy 

## 2022-07-09 ENCOUNTER — Other Ambulatory Visit: Payer: Self-pay | Admitting: Family Medicine

## 2022-07-09 DIAGNOSIS — R052 Subacute cough: Secondary | ICD-10-CM

## 2022-07-28 ENCOUNTER — Encounter: Payer: Self-pay | Admitting: Family Medicine

## 2022-07-28 ENCOUNTER — Ambulatory Visit (INDEPENDENT_AMBULATORY_CARE_PROVIDER_SITE_OTHER): Payer: Medicare HMO | Admitting: Family Medicine

## 2022-07-28 VITALS — BP 133/79 | HR 105 | Temp 97.3°F | Ht 61.0 in | Wt 108.0 lb

## 2022-07-28 DIAGNOSIS — E034 Atrophy of thyroid (acquired): Secondary | ICD-10-CM

## 2022-07-28 DIAGNOSIS — I10 Essential (primary) hypertension: Secondary | ICD-10-CM

## 2022-07-28 DIAGNOSIS — E119 Type 2 diabetes mellitus without complications: Secondary | ICD-10-CM | POA: Diagnosis not present

## 2022-07-28 DIAGNOSIS — K219 Gastro-esophageal reflux disease without esophagitis: Secondary | ICD-10-CM

## 2022-07-28 DIAGNOSIS — N1832 Chronic kidney disease, stage 3b: Secondary | ICD-10-CM

## 2022-07-28 DIAGNOSIS — R600 Localized edema: Secondary | ICD-10-CM | POA: Diagnosis not present

## 2022-07-28 DIAGNOSIS — R7303 Prediabetes: Secondary | ICD-10-CM | POA: Diagnosis not present

## 2022-07-28 DIAGNOSIS — F411 Generalized anxiety disorder: Secondary | ICD-10-CM | POA: Diagnosis not present

## 2022-07-28 DIAGNOSIS — E782 Mixed hyperlipidemia: Secondary | ICD-10-CM

## 2022-07-28 LAB — BAYER DCA HB A1C WAIVED: HB A1C (BAYER DCA - WAIVED): 5.6 % (ref 4.8–5.6)

## 2022-07-28 MED ORDER — OMEPRAZOLE 40 MG PO CPDR: 40.0000 mg | DELAYED_RELEASE_CAPSULE | Freq: Every day | ORAL | 3 refills | Status: DC

## 2022-07-28 MED ORDER — OXAZEPAM 15 MG PO CAPS: 15.0000 mg | ORAL_CAPSULE | Freq: Three times a day (TID) | ORAL | 3 refills | Status: DC | PRN

## 2022-07-28 NOTE — Progress Notes (Signed)
BP 133/79   Pulse (!) 105   Temp (!) 97.3 F (36.3 C)   Ht '5\' 1"'$  (1.549 m)   Wt 108 lb (49 kg)   SpO2 95%   BMI 20.41 kg/m    Subjective:   Patient ID: [name], female    DOB: 12-Sep-1941, 81 y.o.   MRN: [id number]  HPI: [name] is a 81 y.o. female presenting on 07/28/2022 for Medical Management of Chronic Issues, Hyperlipidemia, Hypertension, Atrial Fibrillation, and Prediabetes   HPI Type 2 diabetes mellitus Patient comes in today for recheck of his diabetes. Patient has been currently taking no medication, diet control. Patient is not currently on an ACE inhibitor/ARB. Patient has seen an ophthalmologist this year. Patient denies any issues with their feet. The symptom started onset as an adult A-fib CHF and hypothyroidism CKD ARE RELATED TO DM   Patient has new onset A-fib and is seeing cardiology for this.  They are trying to control it with medicine.  She is currently taking metoprolol.  She says she still gets fatigued and just feels like her energy is down.  She says sometimes she will feel palpitations and progress as well.  She denies any bruising or bleeding.  Anxiety recheck Current rx- oxazepam 15 tid prn # meds rx-90/month Effectiveness of current meds-works well Adverse reactions form meds-none  Pill count performed-No Last drug screen - 12/16/21 ( high risk q25m moderate risk q645mlow risk yearly ) Urine drug screen today- No Was the [place]- yes  If yes were their any concerning findings? -None  No flowsheet data found.   Controlled substance contract signed on: 12/16/2021  Hyperlipidemia Patient is coming in for recheck of his hyperlipidemia. The patient is currently taking no medicine currently, has been intolerant. They deny any issues with myalgias or history of liver damage from it. They deny any focal numbness or weakness or chest pain.   Hypothyroidism recheck Patient is coming in for thyroid recheck today as well. They deny  any issues with hair changes or heat or cold problems or diarrhea or constipation. They deny any chest pain or palpitations. They are currently on levothyroxine 25 micrograms   Hypertension Patient is currently on diltiazem and furosemide and metoprolol, and their blood pressure today is 133/79. Patient denies any lightheadedness or dizziness. Patient denies headaches, blurred vision, chest pains, shortness of breath, or weakness. Denies any side effects from medication and is content with current medication.   Relevant past medical, surgical, family and social history reviewed and updated as indicated. Interim medical history since our last visit reviewed. Allergies and medications reviewed and updated.  Review of Systems  Constitutional:  Negative for chills and fever.  Eyes:  Negative for redness and visual disturbance.  Respiratory:  Negative for chest tightness and shortness of breath.   Cardiovascular:  Negative for chest pain and leg swelling.  Genitourinary:  Negative for difficulty urinating and dysuria.  Musculoskeletal:  Negative for back pain and gait problem.  Skin:  Negative for rash.  Neurological:  Negative for dizziness, light-headedness and headaches.  Psychiatric/Behavioral:  Positive for sleep disturbance. Negative for agitation, behavioral problems and suicidal ideas. The patient is nervous/anxious.   All other systems reviewed and are negative.   Per HPI unless specifically indicated above   Allergies as of 07/28/2022       Reactions   Bisphosphonates Other (See Comments)   Difficulty swallowing   Ciprofloxacin Nausea And Vomiting   Codeine Nausea And  Vomiting   Sulfa Antibiotics Nausea And Vomiting        Medication List        Accurate as of July 28, 2022 11:36 AM. If you have any questions, ask your nurse or doctor.          acetaminophen 500 MG tablet Commonly known as: TYLENOL Take 500 mg by mouth every 8 (eight) hours as needed for mild  pain or moderate pain.   albuterol 108 (90 Base) MCG/ACT inhaler Commonly known as: VENTOLIN HFA USE 2 PUFFS EVERY 6 HOURS AS NEEDED   apixaban 2.5 MG Tabs tablet Commonly known as: ELIQUIS Take 1 tablet (2.5 mg total) by mouth 2 (two) times daily.   Commode Bedside Misc Bedside commode due to tibial fracture   Wheelchair Misc Pt needs wheelchair due to extremity fractures   Misc. Devices Misc Use rolling walker to ambulate around the house as needed.   diltiazem 240 MG 24 hr capsule Commonly known as: CARDIZEM CD Take 1 capsule (240 mg total) by mouth daily.   furosemide 20 MG tablet Commonly known as: LASIX Take 1 tablet (20 mg total) by mouth daily.   levothyroxine 25 MCG tablet Commonly known as: SYNTHROID Take 1 tablet (25 mcg total) by mouth daily.   loperamide 2 MG capsule Commonly known as: IMODIUM Take 1 capsule (2 mg total) by mouth 4 (four) times daily as needed for diarrhea or loose stools.   metoprolol tartrate 25 MG tablet Commonly known as: LOPRESSOR Take 25 mg by mouth 2 (two) times daily.   mirtazapine 15 MG disintegrating tablet Commonly known as: REMERON SOL-TAB Take 1 tablet (15 mg total) by mouth at bedtime.   mometasone 50 MCG/ACT nasal spray Commonly known as: Nasonex Place 2 sprays into the nose daily.   omeprazole 40 MG capsule Commonly known as: PRILOSEC Take 1 capsule (40 mg total) by mouth daily.   ondansetron 4 MG disintegrating tablet Commonly known as: ZOFRAN-ODT TAKE 1 TABLET BY MOUTH EVERY 8 HOURS AS NEEDED FOR NAUSEA & VOMITING   oxazepam 15 MG capsule Commonly known as: SERAX Take 1 capsule (15 mg total) by mouth 3 (three) times daily as needed for sleep. Cancel any other prescriptions on file   polyethylene glycol 17 g packet Commonly known as: MIRALAX / GLYCOLAX Take 17 g by mouth daily as needed for moderate constipation.   potassium chloride 20 MEQ/15ML (10%) Soln TAKE 7.5ML (10MEQ) BY MOUTH DAILY AS NEEDED FOR  CRAMPING What changed: See the new instructions.   sucralfate 1 g tablet Commonly known as: CARAFATE Take 1 tablet (1 g total) by mouth 2 (two) times daily.   triamcinolone ointment 0.5 % Commonly known as: KENALOG APPLY TO THE AFFECTED AREA(S) TOPICALLY TWICE DAILY   Vitamin D3 50 MCG (2000 UT) Chew Chew 4,000 Units by mouth daily.         Objective:   BP 133/79   Pulse (!) 105   Temp (!) 97.3 F (36.3 C)   Ht '5\' 1"'$  (1.549 m)   Wt 108 lb (49 kg)   SpO2 95%   BMI 20.41 kg/m   Wt Readings from Last 3 Encounters:  07/28/22 108 lb (49 kg)  06/29/22 109 lb (49.4 kg)  06/10/22 105 lb (47.6 kg)    Physical Exam Vitals and nursing note reviewed.  Constitutional:      General: She is not in acute distress.    Appearance: She is well-developed. She is not diaphoretic.  Eyes:  Conjunctiva/sclera: Conjunctivae normal.  Cardiovascular:     Rate and Rhythm: Normal rate and regular rhythm.     Heart sounds: Normal heart sounds. No murmur heard. Pulmonary:     Effort: Pulmonary effort is normal. No respiratory distress.     Breath sounds: Normal breath sounds. No wheezing.  Musculoskeletal:        General: Swelling (1+ bilateral lower extremity edema) present. No tenderness. Normal range of motion.  Skin:    General: Skin is warm and dry.     Findings: No rash.  Neurological:     Mental Status: She is alert and oriented to person, place, and time.     Coordination: Coordination normal.  Psychiatric:        Behavior: Behavior normal.       Assessment & Plan:   Problem List Items Addressed This Visit       Cardiovascular and Mediastinum   Essential hypertension     Digestive   GERD (gastroesophageal reflux disease)   Relevant Medications   omeprazole (PRILOSEC) 40 MG capsule     Endocrine   Hypothyroid     Genitourinary   Chronic kidney disease (CKD) stage G3b/A1, moderately decreased glomerular filtration rate (GFR) between 30-44 mL/min/1.73 square  meter and albuminuria creatinine ratio less than 30 mg/g (HCC)     Other   Hyperlipemia   Generalized anxiety disorder   Relevant Medications   oxazepam (SERAX) 15 MG capsule   Prediabetes   Lower extremity edema   Relevant Orders   Compression stockings   Other Visit Diagnoses     Type 2 diabetes mellitus without complication, without long-term current use of insulin (HCC)    -  Primary   Relevant Orders   Bayer DCA Hb A1c Waived     Sent refill for medication, A1c looks good at 5.6.  No changes to medication  Continue to follow with cardiology for A-fib.  Follow up plan: Return in about 4 months (around 11/28/2022), or if symptoms worsen or fail to improve, for Diabetes and anxiety recheck.  Counseling provided for all of the vaccine components Orders Placed This Encounter  Procedures   Compression stockings   Bayer DCA Hb A1c [place], MD [facility] 07/28/2022, 11:36 AM

## 2022-07-29 ENCOUNTER — Telehealth: Payer: Self-pay | Admitting: Family Medicine

## 2022-07-29 ENCOUNTER — Other Ambulatory Visit: Payer: Self-pay | Admitting: Family Medicine

## 2022-07-29 NOTE — Telephone Encounter (Signed)
Closing encounter, addressing in refill request pool

## 2022-07-29 NOTE — Telephone Encounter (Signed)
  Prescription Request  07/29/2022  Is this a "Controlled Substance" medicine? no  Have you seen your PCP in the last 2 weeks? Seen 10/11   If YES, route message to pool  -  If NO, patient needs to be scheduled for appointment.  What is the name of the medication or equipment? diltiazem (CARDIZEM CD) 240 MG 24 hr capsule  Have you contacted your pharmacy to request a refill? Yes    Which pharmacy would you like this sent to? [place], [place] (Ph: [phone])   Patient notified that their request is being sent to the clinical staff for review and that they should receive a response within 2 business days.

## 2022-08-02 ENCOUNTER — Telehealth: Payer: Self-pay | Admitting: Family Medicine

## 2022-08-02 DIAGNOSIS — L570 Actinic keratosis: Secondary | ICD-10-CM | POA: Diagnosis not present

## 2022-08-02 DIAGNOSIS — C44729 Squamous cell carcinoma of skin of left lower limb, including hip: Secondary | ICD-10-CM | POA: Diagnosis not present

## 2022-08-02 NOTE — Telephone Encounter (Signed)
Calling to get clarification on medications patient is supposed to be taking. Please call back and advise.

## 2022-08-02 NOTE — Telephone Encounter (Signed)
Left message for pt to return call.

## 2022-08-03 NOTE — Telephone Encounter (Signed)
Spoke with husband. Pt has 2 Rx's of Levothyroxine. One is '25mg'$  and one is '50mg'$ . Instructed husband to have pt continue with the '25mg'$  for now and will address with Dr. Warrick Parisian when he returns tomorrow. Husband understood.

## 2022-08-03 NOTE — Telephone Encounter (Signed)
During the last hospitalization her dose was reduced down to 25 mcg so she should be on the 25 mcg, not the 50

## 2022-08-04 NOTE — Telephone Encounter (Signed)
Pt's husband informed and understood.

## 2022-08-05 ENCOUNTER — Ambulatory Visit: Payer: Medicare HMO | Admitting: Orthopaedic Surgery

## 2022-08-11 ENCOUNTER — Ambulatory Visit (INDEPENDENT_AMBULATORY_CARE_PROVIDER_SITE_OTHER): Payer: Medicare HMO | Admitting: Orthopaedic Surgery

## 2022-08-11 ENCOUNTER — Encounter: Payer: Self-pay | Admitting: Orthopaedic Surgery

## 2022-08-11 DIAGNOSIS — S42202D Unspecified fracture of upper end of left humerus, subsequent encounter for fracture with routine healing: Secondary | ICD-10-CM

## 2022-08-11 NOTE — Patient Instructions (Signed)
As the weather changes and gets cooler, you may notice you are affected more. You may have more pain in your joints. This is normal. Dress warmly and make sure that area is covered well.   

## 2022-08-11 NOTE — Progress Notes (Signed)
I am much better.  She has good functional ROM of the left shoulder and no pain.  NV intact.  Encounter Diagnosis  Name Primary?   Closed traumatic nondisplaced fracture of proximal end of left humerus with routine healing, subsequent encounter Yes   I will see her as needed.  Call if any problem.  Precautions discussed.  Electronically Signed Sanjuana Kava, MD 10/25/20239:34 AM

## 2022-09-03 ENCOUNTER — Other Ambulatory Visit: Payer: Self-pay | Admitting: Family Medicine

## 2022-09-03 NOTE — Telephone Encounter (Signed)
Last OV 07/28/22. Last RF historical provider. Next OV 11/29/22

## 2022-09-24 ENCOUNTER — Other Ambulatory Visit (HOSPITAL_BASED_OUTPATIENT_CLINIC_OR_DEPARTMENT_OTHER): Payer: Medicare HMO

## 2022-10-04 ENCOUNTER — Ambulatory Visit (HOSPITAL_COMMUNITY)
Admission: RE | Admit: 2022-10-04 | Discharge: 2022-10-04 | Disposition: A | Discharge status: Home / Self Care | Payer: Medicare HMO | Source: Ambulatory Visit | Attending: Cardiovascular Disease | Admitting: Cardiovascular Disease

## 2022-10-04 DIAGNOSIS — I050 Rheumatic mitral stenosis: Secondary | ICD-10-CM | POA: Diagnosis not present

## 2022-10-04 LAB — ECHOCARDIOGRAM COMPLETE
AR max vel: 1.46 cm2
AV Area VTI: 1.45 cm2
AV Area mean vel: 1.45 cm2
AV Mean grad: 4.5 mmHg
AV Peak grad: 10.4 mmHg
Ao pk vel: 1.61 m/s
Area-P 1/2: 3.12 cm2
MV VTI: 0.92 cm2
P 1/2 time: 585 msec
S' Lateral: 2.6 cm

## 2022-10-04 NOTE — Progress Notes (Signed)
*  PRELIMINARY RESULTS* Echocardiogram 2D Echocardiogram has been performed.  [name] 10/04/2022, 1:56 PM

## 2022-10-05 NOTE — Progress Notes (Unsigned)
Cardiology Office Note   Date:  10/06/2022   ID:  [name], [name] Jul 03, 1941, MRN [id number]  PCP:  Dettinger, Fransisca Kaufmann, MD  Cardiologist:   Skeet Latch, MD   Chief Complaint  Patient presents with   Atrial Fibrillation      History of Present Illness: [name] is a 82 y.o. 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  She was seen in the hospital 06/04/2022 for the evaluation of atrial fibrillation with RVR and placed on diltiazem 240 mg daily. Metoprolol 25 mg bid.   The plan for rate control.  She was seen by Dr. Oval Linsey but she lives appear.  She is not really noticing her fibrillation.  The patient denies any new symptoms such as chest discomfort, neck or arm discomfort. There has been no new shortness of breath, PND or orthopnea. There have been no reported palpitations, presyncope or syncope.    Past Medical History:  Diagnosis Date   A-fib [facility])    Anxiety states    Aortic regurgitation 05/24/2022   Atrial fibrillation (HCC)    Breast cyst    Cancer (HCC)    skin   Cataract    Chronic diastolic heart failure ([place]) 05/24/2022   Constipation    Esophageal stricture    Essential hypertension 12/18/2015   Fracture, rib    GERD (gastroesophageal reflux disease)    Hiatal hernia    Lower extremity edema 12/18/2015   Mitral stenosis 05/24/2022   Osteoporosis    Other and unspecified hyperlipidemia    PAC (premature atrial contraction) 12/30/2015   Palpitations 12/18/2015   Unspecified hypothyroidism    Vertebral fracture, osteoporotic ([place])     Past Surgical History:  Procedure Laterality Date   ABDOMINAL HYSTERECTOMY     APPENDECTOMY     BREAST BIOPSY     bil cysts   CATARACT EXTRACTION     ESOPHAGOGASTRODUODENOSCOPY N/A 01/19/2018   Procedure:  ESOPHAGOGASTRODUODENOSCOPY (EGD);  Surgeon: Clarene Essex, MD;  Location: [facility];  Service: Endoscopy;  Laterality: N/A;  with flouro   EYE SURGERY     HAND SURGERY Left    IR GENERIC HISTORICAL  12/10/2016   IR RADIOLOGIST EVAL & MGMT 12/10/2016 [facility]   IR GENERIC HISTORICAL  12/23/2016   IR KYPHO THORACIC WITH BONE BIOPSY 12/23/2016 Luanne Bras, MD [facility]   PARTIAL HYSTERECTOMY     THYROID LOBECTOMY     right   VERTEBROPLASTY       Current Outpatient Medications  Medication Sig Dispense Refill   acetaminophen (TYLENOL) 500 MG tablet Take 500 mg by mouth every 8 (eight) hours as needed for mild pain or moderate pain.     albuterol (VENTOLIN HFA) 108 (90 Base) MCG/ACT inhaler USE 2 PUFFS EVERY 6 HOURS AS NEEDED 8.5 g 2   apixaban (ELIQUIS) 2.5 MG TABS tablet Take 1 tablet (2.5 mg total) by mouth 2 (two) times daily. 180 tablet 3   Cholecalciferol (VITAMIN D3) 2000 units CHEW Chew 4,000 Units by mouth daily.     diltiazem (CARDIZEM CD) 240 MG 24 hr capsule TAKE ONE CAPSULE BY MOUTH DAILY 90 capsule 3   furosemide (LASIX) 20 MG tablet Take 1 tablet (20 mg total) by mouth daily. 90 tablet 3   levothyroxine (SYNTHROID) 25 MCG  tablet TAKE ONE (1) TABLET BY MOUTH EVERY DAY 90 tablet 3   loperamide (IMODIUM) 2 MG capsule Take 1 capsule (2 mg total) by mouth 4 (four) times daily as needed for diarrhea or loose stools. 30 capsule 0   metoprolol tartrate (LOPRESSOR) 25 MG tablet TAKE 1 TABLET BY MOUTH TWICE DAILY AS DIRECTED 180 tablet 1   mirtazapine (REMERON SOL-TAB) 15 MG disintegrating tablet Take 1 tablet (15 mg total) by mouth at bedtime. 90 tablet 3   Misc. Devices (COMMODE BEDSIDE) MISC Bedside commode due to tibial fracture 1 each 0   Misc. Devices [facility]) MISC Pt needs wheelchair due to extremity fractures 1 each 0   Misc. Devices MISC Use rolling walker to ambulate around the house as needed. 1 each 0   mometasone (NASONEX) 50 MCG/ACT nasal spray Place 2 sprays  into the nose daily. 1 each 12   omeprazole (PRILOSEC) 40 MG capsule Take 1 capsule (40 mg total) by mouth daily. 90 capsule 3   ondansetron (ZOFRAN-ODT) 4 MG disintegrating tablet TAKE 1 TABLET BY MOUTH EVERY 8 HOURS AS NEEDED FOR NAUSEA & VOMITING 20 tablet 0   oxazepam (SERAX) 15 MG capsule Take 1 capsule (15 mg total) by mouth 3 (three) times daily as needed for sleep. Cancel any other prescriptions on file 90 capsule 3   polyethylene glycol (MIRALAX / GLYCOLAX) packet Take 17 g by mouth daily as needed for moderate constipation.      potassium chloride 20 MEQ/15ML (10%) SOLN TAKE 7.5ML (10MEQ) BY MOUTH DAILY AS NEEDED FOR CRAMPING (Patient taking differently: Take 10 mEq by mouth daily as needed (cramping).) 480 mL 0   sucralfate (CARAFATE) 1 g tablet Take 1 tablet (1 g total) by mouth 2 (two) times daily. 180 tablet 3   triamcinolone ointment (KENALOG) 0.5 % APPLY TO THE AFFECTED AREA(S) TOPICALLY TWICE DAILY 30 g 2   No current facility-administered medications for this visit.    Allergies:   Bisphosphonates, Ciprofloxacin, Codeine, and Sulfa antibiotics    ROS:  Please see the history of present illness.   Otherwise, review of systems are positive for none.   All other systems are reviewed and negative.    PHYSICAL EXAM: VS:  BP 120/68   Pulse (!) 58   Ht '5\' 1"'$  (1.549 m)   Wt 107 lb 9.6 oz (48.8 kg)   SpO2 94%   BMI 20.33 kg/m  , BMI Body mass index is 20.33 kg/m. GENERAL:  Well appearing NECK:  No jugular venous distention, waveform within normal limits, carotid upstroke brisk and symmetric, no bruits, no thyromegaly LUNGS:  Clear to auscultation bilaterally CHEST:  Unremarkable HEART:  PMI not displaced or sustained,S1 and S2 within normal limits, no S3, no clicks, no rubs, soft diastolic murmur heard at the third left interspace murmurs, irregular  ABD:  Flat, positive bowel sounds normal in frequency in pitch, no bruits, no rebound, no guarding, no midline pulsatile mass,  no hepatomegaly, no splenomegaly EXT:  2 plus pulses throughout, no edema, no cyanosis no clubbing   EKG:  EKG is not ordered today. The ekg ordered 06/10/2022 demonstrates atrial fibrillation with slow ventricular rate, right bundle branch block   Recent Labs: 06/03/2022: TSH 0.031 06/10/2022: ALT 9; BUN 15; Creatinine, Ser 1.20; Hemoglobin 11.3; Magnesium 2.0; Platelets 333; Potassium 4.3; Sodium 142    Lipid Panel    Component Value Date/Time   CHOL 145 11/27/2021 1135   CHOL 122 07/23/2013 0858   TRIG 134 11/27/2021  1135   TRIG 116 08/06/2015 1221   TRIG 89 07/23/2013 0858   HDL 42 11/27/2021 1135   HDL 56 08/06/2015 1221   HDL 47 07/23/2013 0858   CHOLHDL 3.5 11/27/2021 1135   LDLCALC 79 11/27/2021 1135   LDLCALC 63 07/17/2014 0929   LDLCALC 57 07/23/2013 0858      Wt Readings from Last 3 Encounters:  10/06/22 107 lb 9.6 oz (48.8 kg)  07/28/22 108 lb (49 kg)  06/29/22 109 lb (49.4 kg)      Other studies Reviewed: Additional studies/ records that were reviewed today include: Hospital records. Review of the above records demonstrates:  Please see elsewhere in the note.     ASSESSMENT AND PLAN:  Afib now rate controlled:   She has rate control.  She tolerates anticoagulation.  She is on the appropriate dosing given her age and size.  She is going to be seen in February by her primary providers and I would suggest follow-up labs to include a CBC.   Valvular heart diseaes:  Echo in 03/2022 showed mild MR, mild MS and severe AI. LVE 60-65%.  However, she has normal size of her LV and function.  She is not having any symptom.  We are going to follow this clinically.  She is not sure at her age with her she never went repair.   HFpEF: She is euvolemic.  No change in therapy   Current medicines are reviewed at length with the patient today.  The patient has concerns regarding medicines.  The following changes have been made:  no change  Labs/ tests ordered today  include:  No orders of the defined types were placed in this encounter.    Disposition:   FU with me in six months.      Signed, Minus Breeding, MD  10/06/2022 1:59 PM    [place]

## 2022-10-06 ENCOUNTER — Ambulatory Visit: Payer: Medicare HMO | Admitting: Cardiology

## 2022-10-06 ENCOUNTER — Encounter: Payer: Self-pay | Admitting: Cardiology

## 2022-10-06 VITALS — BP 120/68 | HR 58 | Ht 61.0 in | Wt 107.6 lb

## 2022-10-06 DIAGNOSIS — I351 Nonrheumatic aortic (valve) insufficiency: Secondary | ICD-10-CM | POA: Diagnosis not present

## 2022-10-06 DIAGNOSIS — I4891 Unspecified atrial fibrillation: Secondary | ICD-10-CM

## 2022-10-06 DIAGNOSIS — I5032 Chronic diastolic (congestive) heart failure: Secondary | ICD-10-CM

## 2022-10-06 NOTE — Patient Instructions (Signed)
Medication Instructions:  The current medical regimen is effective;  continue present plan and medications.  *If you need a refill on your cardiac medications before your next appointment, please call your pharmacy*  Follow-Up: At [facility], you and your health needs are our priority.  As part of our continuing mission to provide you with exceptional heart care, we have created designated Provider Care Teams.  These Care Teams include your primary Cardiologist (physician) and Advanced Practice Providers (APPs -  Physician Assistants and Nurse Practitioners) who all work together to provide you with the care you need, when you need it.  We recommend signing up for the patient portal called "MyChart".  Sign up information is provided on this After Visit Summary.  MyChart is used to connect with patients for Virtual Visits (Telemedicine).  Patients are able to view lab/test results, encounter notes, upcoming appointments, etc.  Non-urgent messages can be sent to your provider as well.   To learn more about what you can do with MyChart, go to [email or web address].    Your next appointment:   6 month(s)  The format for your next appointment:   In Person  Provider:   James Hochrein, MD     Important Information About Sugar       

## 2022-10-07 ENCOUNTER — Ambulatory Visit (HOSPITAL_BASED_OUTPATIENT_CLINIC_OR_DEPARTMENT_OTHER): Payer: Medicare HMO | Admitting: Cardiovascular Disease

## 2022-10-15 ENCOUNTER — Encounter: Payer: Self-pay | Admitting: Family Medicine

## 2022-10-15 ENCOUNTER — Ambulatory Visit (INDEPENDENT_AMBULATORY_CARE_PROVIDER_SITE_OTHER): Payer: Medicare HMO

## 2022-10-15 ENCOUNTER — Ambulatory Visit (INDEPENDENT_AMBULATORY_CARE_PROVIDER_SITE_OTHER): Payer: Medicare HMO | Admitting: Family Medicine

## 2022-10-15 VITALS — BP 141/81 | HR 70 | Temp 97.3°F | Ht 61.0 in | Wt 106.0 lb

## 2022-10-15 DIAGNOSIS — J449 Chronic obstructive pulmonary disease, unspecified: Secondary | ICD-10-CM | POA: Diagnosis not present

## 2022-10-15 DIAGNOSIS — R079 Chest pain, unspecified: Secondary | ICD-10-CM | POA: Diagnosis not present

## 2022-10-15 DIAGNOSIS — R0789 Other chest pain: Secondary | ICD-10-CM

## 2022-10-15 MED ORDER — LIDOCAINE 4 % EX PTCH: 1.0000 | MEDICATED_PATCH | CUTANEOUS | 0 refills | Status: AC

## 2022-10-15 NOTE — Progress Notes (Signed)
Subjective:  Patient ID: [name], female    DOB: [birth year], 81 y.o.   MRN: [id number]  Patient Care Team: Dettinger, Fransisca Kaufmann, MD as PCP - General (Family Medicine) Skeet Latch, MD as PCP - Cardiology (Cardiology) Minus Breeding, MD (Cardiology) Clarene Essex, MD (Gastroenterology) Skeet Latch, MD as Attending Physician (Cardiology) Sandford Craze, MD as Referring Physician (Dermatology) Lavera Guise, [facility] as Pharmacist (Family Medicine)   Chief Complaint:  echocardiogram (Patient had this done on 12/18 and states her chest has been hurting and bruised since then.)   HPI: [name] is a 81 y.o. female presenting on 10/15/2022 for echocardiogram (Patient had this done on 12/18 and states her chest has been hurting and bruised since then.)   Pt reports she had an echo completed on 10/04/2022. States since this time her chest wall has been hurting and bruised. States it hurts to touch and when she moves certain ways. Aching to sharp in nature. No other associated symptoms.       Relevant past medical, surgical, family, and social history reviewed and updated as indicated.  Allergies and medications reviewed and updated. Data reviewed: Chart in Epic.   Past Medical History:  Diagnosis Date   A-fib [facility])    Anxiety states    Aortic regurgitation 05/24/2022   Atrial fibrillation (HCC)    Breast cyst    Cancer (HCC)    skin   Cataract    Chronic diastolic heart failure ([place]) 05/24/2022   Constipation    Esophageal stricture    Essential hypertension 12/18/2015   Fracture, rib    GERD (gastroesophageal reflux disease)    Hiatal hernia    Lower extremity edema 12/18/2015   Mitral stenosis 05/24/2022   Osteoporosis    Other and unspecified hyperlipidemia    PAC (premature atrial contraction) 12/30/2015   Palpitations 12/18/2015   Unspecified hypothyroidism    Vertebral fracture, osteoporotic ([place])     Past Surgical History:   Procedure Laterality Date   ABDOMINAL HYSTERECTOMY     APPENDECTOMY     BREAST BIOPSY     bil cysts   CATARACT EXTRACTION     ESOPHAGOGASTRODUODENOSCOPY N/A 01/19/2018   Procedure: ESOPHAGOGASTRODUODENOSCOPY (EGD);  Surgeon: Clarene Essex, MD;  Location: [facility];  Service: Endoscopy;  Laterality: N/A;  with flouro   EYE SURGERY     HAND SURGERY Left    IR GENERIC HISTORICAL  12/10/2016   IR RADIOLOGIST EVAL & MGMT 12/10/2016 [facility]   IR GENERIC HISTORICAL  12/23/2016   IR KYPHO THORACIC WITH BONE BIOPSY 12/23/2016 Luanne Bras, MD [facility]   PARTIAL HYSTERECTOMY     THYROID LOBECTOMY     right   VERTEBROPLASTY      Social History   Socioeconomic History   Marital status: Married    Spouse name: Elmer   Number of children: 0   Years of education: 8   Highest education level: 8th grade  Occupational History   Occupation: retired    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   Social Determinants of Health   Financial Resource Strain: Medium Risk (12/11/2021)   Overall Financial Resource Strain (CARDIA)    Difficulty of Paying Living Expenses: Somewhat hard  Food Insecurity: Food Insecurity Present (12/11/2021)   Hunger Vital Sign    Worried About Running Out of Food in the Last Year: Sometimes true    Ran Out of Food in the Last Year: Never true  Transportation Needs: No Transportation Needs (12/11/2021)   PRAPARE - Hydrologist (Medical): No    Lack of  Transportation (Non-Medical): No  Physical Activity: Insufficiently Active (12/11/2021)   Exercise Vital Sign    Days of Exercise per Week: 7 days    Minutes of Exercise per Session: 20 min  Stress: No Stress Concern Present (12/11/2021)   [place]    Feeling of Stress : Only a little  Social Connections: Moderately Integrated (12/11/2021)   Social Connection and Isolation Panel [NHANES]    Frequency of Communication with Friends and Family: More than three times a week    Frequency of Social Gatherings with Friends and Family: More than three times a week    Attends Religious Services: Never    Marine scientist or Organizations: Yes    Attends Music therapist: More than 4 times per year    Marital Status: Married  Human resources officer Violence: Not At Risk (12/11/2021)   Humiliation, Afraid, Rape, and Kick questionnaire    Fear of Current or Ex-Partner: No    Emotionally Abused: No    Physically Abused: No    Sexually Abused: No    Outpatient Encounter Medications as of 10/15/2022  Medication Sig   acetaminophen (TYLENOL) 500 MG tablet Take 500 mg by mouth every 8 (eight) hours as needed for mild pain or moderate pain.   albuterol (VENTOLIN HFA) 108 (90 Base) MCG/ACT inhaler USE 2 PUFFS EVERY 6 HOURS AS NEEDED   apixaban (ELIQUIS) 2.5 MG TABS tablet Take 1 tablet (2.5 mg total) by mouth 2 (two) times daily.   Cholecalciferol (VITAMIN D3) 2000 units CHEW Chew 4,000 Units by mouth daily.   levothyroxine (SYNTHROID) 25 MCG tablet TAKE ONE (1) TABLET BY MOUTH EVERY DAY   lidocaine (HM LIDOCAINE PATCH) 4 % Place 1 patch onto the skin daily.   metoprolol tartrate (LOPRESSOR) 25 MG tablet TAKE 1 TABLET BY MOUTH TWICE DAILY AS DIRECTED   Misc. Devices (COMMODE BEDSIDE) MISC Bedside commode due to tibial fracture   Misc. Devices [facility]) MISC Pt needs wheelchair due to extremity fractures   Misc. Devices  MISC Use rolling walker to ambulate around the house as needed.   mometasone (NASONEX) 50 MCG/ACT nasal spray Place 2 sprays into the nose daily.   omeprazole (PRILOSEC) 40 MG capsule Take 1 capsule (40 mg total) by mouth daily.   oxazepam (SERAX) 15 MG capsule Take 1 capsule (15 mg total) by mouth 3 (three) times daily as needed for sleep. Cancel any other prescriptions on file   potassium chloride 20 MEQ/15ML (10%) SOLN TAKE 7.5ML (10MEQ) BY MOUTH DAILY AS NEEDED FOR CRAMPING (Patient taking differently: Take 10 mEq by mouth daily as needed (cramping).)   diltiazem (CARDIZEM CD) 240 MG 24 hr capsule TAKE ONE CAPSULE BY MOUTH DAILY (Patient not taking: Reported on 10/15/2022)  furosemide (LASIX) 20 MG tablet Take 1 tablet (20 mg total) by mouth daily. (Patient not taking: Reported on 10/15/2022)   loperamide (IMODIUM) 2 MG capsule Take 1 capsule (2 mg total) by mouth 4 (four) times daily as needed for diarrhea or loose stools. (Patient not taking: Reported on 10/15/2022)   mirtazapine (REMERON SOL-TAB) 15 MG disintegrating tablet Take 1 tablet (15 mg total) by mouth at bedtime. (Patient not taking: Reported on 10/15/2022)   ondansetron (ZOFRAN-ODT) 4 MG disintegrating tablet TAKE 1 TABLET BY MOUTH EVERY 8 HOURS AS NEEDED FOR NAUSEA & VOMITING (Patient not taking: Reported on 10/15/2022)   polyethylene glycol (MIRALAX / GLYCOLAX) packet Take 17 g by mouth daily as needed for moderate constipation.  (Patient not taking: Reported on 10/15/2022)   sucralfate (CARAFATE) 1 g tablet Take 1 tablet (1 g total) by mouth 2 (two) times daily. (Patient not taking: Reported on 10/15/2022)   triamcinolone ointment (KENALOG) 0.5 % APPLY TO THE AFFECTED AREA(S) TOPICALLY TWICE DAILY (Patient not taking: Reported on 10/15/2022)   No facility-administered encounter medications on file as of 10/15/2022.    Allergies  Allergen Reactions   Bisphosphonates Other (See Comments)    Difficulty swallowing     Ciprofloxacin Nausea And Vomiting   Codeine Nausea And Vomiting   Sulfa Antibiotics Nausea And Vomiting    Review of Systems  Constitutional:  Negative for activity change, appetite change, chills, diaphoresis, fatigue, fever and unexpected weight change.  Respiratory:  Negative for cough and shortness of breath.   Cardiovascular:  Positive for chest pain (chest wall). Negative for palpitations and leg swelling.  Gastrointestinal:  Negative for abdominal pain, nausea and vomiting.  Genitourinary:  Negative for decreased urine volume and difficulty urinating.  Musculoskeletal:  Positive for arthralgias, gait problem and myalgias.  Neurological:  Negative for weakness.  Psychiatric/Behavioral:  Negative for confusion.   All other systems reviewed and are negative.       Objective:  BP (!) 141/81   Pulse 70   Temp (!) 97.3 F (36.3 C) (Temporal)   Ht '5\' 1"'$  (1.549 m)   Wt 106 lb (48.1 kg)   SpO2 96%   BMI 20.03 kg/m    Wt Readings from Last 3 Encounters:  10/15/22 106 lb (48.1 kg)  10/06/22 107 lb 9.6 oz (48.8 kg)  07/28/22 108 lb (49 kg)    Physical Exam Vitals and nursing note reviewed.  Constitutional:      General: She is not in acute distress.    Appearance: She is not ill-appearing, toxic-appearing or diaphoretic.     Comments: Frail elderly  HENT:     Head: Normocephalic and atraumatic.     Mouth/Throat:     Mouth: Mucous membranes are moist.  Eyes:     Conjunctiva/sclera: Conjunctivae normal.     Pupils: Pupils are equal, round, and reactive to light.  Cardiovascular:     Rate and Rhythm: Normal rate. Rhythm irregularly irregular. No extrasystoles are present.    Heart sounds: Murmur heard.     Systolic murmur is present with a grade of 2/6.  Pulmonary:     Effort: Pulmonary effort is normal.     Breath sounds: Normal breath sounds.  Chest:     Chest wall: Tenderness present. No mass, lacerations, deformity, swelling, crepitus or edema. There is no  dullness to percussion.    Abdominal:     General: Bowel sounds are normal.     Palpations: Abdomen is soft.  Musculoskeletal:  Right lower leg: No edema.     Left lower leg: No edema.     Comments: Kyphosis   Skin:    General: Skin is warm and dry.     Capillary Refill: Capillary refill takes less than 2 seconds.  Neurological:     General: No focal deficit present.     Mental Status: She is alert and oriented to person, place, and time.     Gait: Gait abnormal (using cane).  Psychiatric:        Mood and Affect: Mood normal.        Behavior: Behavior normal.        Thought Content: Thought content normal.        Judgment: Judgment normal.     Results for orders placed or performed during the hospital encounter of 10/04/22  ECHOCARDIOGRAM COMPLETE  Result Value Ref Range   AR max vel 1.46 cm2   AV Area VTI 1.45 cm2   AV Mean grad 4.5 mmHg   AV Peak grad 10.4 mmHg   Ao pk vel 1.61 m/s   AV Area mean vel 1.45 cm2   MV VTI 0.92 cm2   Area-P 1/2 3.12 cm2   S' Lateral 2.60 cm   P 1/2 time 585 msec     X-Ray: CXR: Kyphosis of thoracic spine. No acute findings. Preliminary x-ray reading by Monia Pouch, FNP-C, [facility].   Pertinent labs & imaging results that were available during my care of the patient were reviewed by me and considered in my medical decision making.  Assessment & Plan:  Kyrra was seen today for echocardiogram.  Diagnoses and all orders for this visit:  Chest wall pain No acute findings on imaging, will notify pt if radiology reading differs. Symptomatic care discussed in detail. Continue tylenol, lidocaine patches as prescribed. Report new, worsening, or persistent symptoms.  -     DG Chest 2 View; Future -     lidocaine (HM LIDOCAINE PATCH) 4 %; Place 1 patch onto the skin daily.     Continue all other maintenance medications.  Follow up plan: Return if symptoms worsen or fail to improve.   Continue healthy lifestyle choices, including diet  (rich in fruits, vegetables, and lean proteins, and low in salt and simple carbohydrates) and exercise (at least 30 minutes of moderate physical activity daily).  Educational handout given for chest wall pain  The above assessment and management plan was discussed with the patient. The patient verbalized understanding of and has agreed to the management plan. Patient is aware to call the clinic if they develop any new symptoms or if symptoms persist or worsen. Patient is aware when to return to the clinic for a follow-up visit. Patient educated on when it is appropriate to go to the emergency department.   Monia Pouch, FNP-C [facility] [phone]

## 2022-10-18 ENCOUNTER — Emergency Department (HOSPITAL_COMMUNITY)
Admission: EM | Admit: 2022-10-18 | Discharge: 2022-10-19 | Disposition: A | Discharge status: Home / Self Care | Payer: Medicare HMO | Attending: Emergency Medicine | Admitting: Emergency Medicine

## 2022-10-18 ENCOUNTER — Emergency Department (HOSPITAL_COMMUNITY): Payer: Medicare HMO

## 2022-10-18 ENCOUNTER — Encounter (HOSPITAL_COMMUNITY): Payer: Self-pay | Admitting: Emergency Medicine

## 2022-10-18 ENCOUNTER — Other Ambulatory Visit: Payer: Self-pay

## 2022-10-18 DIAGNOSIS — K449 Diaphragmatic hernia without obstruction or gangrene: Secondary | ICD-10-CM | POA: Diagnosis not present

## 2022-10-18 DIAGNOSIS — J432 Centrilobular emphysema: Secondary | ICD-10-CM | POA: Insufficient documentation

## 2022-10-18 DIAGNOSIS — K5730 Diverticulosis of large intestine without perforation or abscess without bleeding: Secondary | ICD-10-CM | POA: Diagnosis not present

## 2022-10-18 DIAGNOSIS — I2510 Atherosclerotic heart disease of native coronary artery without angina pectoris: Secondary | ICD-10-CM | POA: Diagnosis not present

## 2022-10-18 DIAGNOSIS — S20219A Contusion of unspecified front wall of thorax, initial encounter: Secondary | ICD-10-CM | POA: Insufficient documentation

## 2022-10-18 DIAGNOSIS — J90 Pleural effusion, not elsewhere classified: Secondary | ICD-10-CM | POA: Diagnosis not present

## 2022-10-18 DIAGNOSIS — I700 Atherosclerosis of aorta: Secondary | ICD-10-CM | POA: Insufficient documentation

## 2022-10-18 DIAGNOSIS — R109 Unspecified abdominal pain: Secondary | ICD-10-CM | POA: Diagnosis not present

## 2022-10-18 DIAGNOSIS — J9811 Atelectasis: Secondary | ICD-10-CM | POA: Insufficient documentation

## 2022-10-18 DIAGNOSIS — I517 Cardiomegaly: Secondary | ICD-10-CM | POA: Insufficient documentation

## 2022-10-18 DIAGNOSIS — R079 Chest pain, unspecified: Secondary | ICD-10-CM | POA: Diagnosis not present

## 2022-10-18 DIAGNOSIS — S3991XA Unspecified injury of abdomen, initial encounter: Secondary | ICD-10-CM | POA: Diagnosis not present

## 2022-10-18 DIAGNOSIS — S299XXA Unspecified injury of thorax, initial encounter: Secondary | ICD-10-CM | POA: Diagnosis not present

## 2022-10-18 DIAGNOSIS — K4090 Unilateral inguinal hernia, without obstruction or gangrene, not specified as recurrent: Secondary | ICD-10-CM | POA: Diagnosis not present

## 2022-10-18 DIAGNOSIS — X58XXXA Exposure to other specified factors, initial encounter: Secondary | ICD-10-CM | POA: Diagnosis not present

## 2022-10-18 DIAGNOSIS — R0789 Other chest pain: Secondary | ICD-10-CM | POA: Diagnosis not present

## 2022-10-18 LAB — CBC
HCT: 38.6 % (ref 36.0–46.0)
Hemoglobin: 12.1 g/dL (ref 12.0–15.0)
MCH: 28.5 pg (ref 26.0–34.0)
MCHC: 31.3 g/dL (ref 30.0–36.0)
MCV: 91 fL (ref 80.0–100.0)
Platelets: 289 10*3/uL (ref 150–400)
RBC: 4.24 MIL/uL (ref 3.87–5.11)
RDW: 17.2 % — ABNORMAL HIGH (ref 11.5–15.5)
WBC: 14.1 10*3/uL — ABNORMAL HIGH (ref 4.0–10.5)
nRBC: 0 % (ref 0.0–0.2)

## 2022-10-18 LAB — BASIC METABOLIC PANEL
Anion gap: 8 (ref 5–15)
BUN: 20 mg/dL (ref 8–23)
CO2: 24 mmol/L (ref 22–32)
Calcium: 9.1 mg/dL (ref 8.9–10.3)
Chloride: 104 mmol/L (ref 98–111)
Creatinine, Ser: 1.32 mg/dL — ABNORMAL HIGH (ref 0.44–1.00)
GFR, Estimated: 40 mL/min — ABNORMAL LOW (ref >60)
Glucose, Bld: 135 mg/dL — ABNORMAL HIGH (ref 70–99)
Potassium: 4.5 mmol/L (ref 3.5–5.1)
Sodium: 136 mmol/L (ref 135–145)

## 2022-10-18 LAB — TROPONIN I (HIGH SENSITIVITY): Troponin I (High Sensitivity): 5 ng/L (ref <18)

## 2022-10-18 NOTE — ED Notes (Signed)
Pt c/o chest pain since having ultrasound of heart. Pt seen pcp for the same last week and they told her she had a contusion.

## 2022-10-18 NOTE — ED Triage Notes (Signed)
POV, pt c/o right sided flank pain/ CP since November 18. States has been hurting since had an [place] on her heart. States has "soft bones". Denies any difficulty urinating.

## 2022-10-19 ENCOUNTER — Emergency Department (HOSPITAL_COMMUNITY): Payer: Medicare HMO

## 2022-10-19 DIAGNOSIS — K4090 Unilateral inguinal hernia, without obstruction or gangrene, not specified as recurrent: Secondary | ICD-10-CM | POA: Diagnosis not present

## 2022-10-19 DIAGNOSIS — K5730 Diverticulosis of large intestine without perforation or abscess without bleeding: Secondary | ICD-10-CM | POA: Diagnosis not present

## 2022-10-19 DIAGNOSIS — S3991XA Unspecified injury of abdomen, initial encounter: Secondary | ICD-10-CM | POA: Diagnosis not present

## 2022-10-19 DIAGNOSIS — J9811 Atelectasis: Secondary | ICD-10-CM | POA: Diagnosis not present

## 2022-10-19 DIAGNOSIS — K449 Diaphragmatic hernia without obstruction or gangrene: Secondary | ICD-10-CM | POA: Diagnosis not present

## 2022-10-19 DIAGNOSIS — J90 Pleural effusion, not elsewhere classified: Secondary | ICD-10-CM | POA: Diagnosis not present

## 2022-10-19 DIAGNOSIS — S299XXA Unspecified injury of thorax, initial encounter: Secondary | ICD-10-CM | POA: Diagnosis not present

## 2022-10-19 LAB — TROPONIN I (HIGH SENSITIVITY): Troponin I (High Sensitivity): 5 ng/L (ref <18)

## 2022-10-19 MED ORDER — LACTATED RINGERS IV BOLUS
500.0000 mL | Freq: Once | INTRAVENOUS | Status: AC
  Administered 2022-10-19: 500 mL via INTRAVENOUS

## 2022-10-19 MED ORDER — IOHEXOL 300 MG/ML  SOLN
60.0000 mL | Freq: Once | INTRAMUSCULAR | Status: AC | PRN
  Administered 2022-10-19: 60 mL via INTRAVENOUS

## 2022-10-19 MED ORDER — OXYCODONE-ACETAMINOPHEN 5-325 MG PO TABS: 1.0000 | ORAL_TABLET | Freq: Four times a day (QID) | ORAL | 0 refills | Status: DC | PRN

## 2022-10-19 MED ORDER — FENTANYL CITRATE PF 50 MCG/ML IJ SOSY
50.0000 ug | PREFILLED_SYRINGE | Freq: Once | INTRAMUSCULAR | Status: AC
  Administered 2022-10-19: 50 ug via INTRAVENOUS
  Filled 2022-10-19: qty 1

## 2022-10-19 NOTE — ED Provider Notes (Signed)
[facility] EMERGENCY DEPARTMENT Provider Note   CSN: [id number] Arrival date & time: 10/18/22  2217     History  Chief Complaint  Patient presents with   Flank/Chest Pain    [name] is a 82 y.o. female.  82 year old female who presents ER today with chest pain.  Patient states that she had an echocardiogram done a couple weeks ago and has had pain and bruising in the epigastric area since then.  Saw her primary physician a few days ago just diagnosed her with a chest wall contusion however if she continues have severe pain keeping her up at night so she presents here for further evaluation.  No cough, fever, GI symptoms.  She states she might have a bit of right-sided flank pain as well.        Home Medications Prior to Admission medications   Medication Sig Start Date End Date Taking? Authorizing Provider  oxyCODONE-acetaminophen (PERCOCET/ROXICET) 5-325 MG tablet Take 1 tablet by mouth every 6 (six) hours as needed for severe pain. 10/19/22  Yes Seema Blum, Corene Cornea, MD  acetaminophen (TYLENOL) 500 MG tablet Take 500 mg by mouth every 8 (eight) hours as needed for mild pain or moderate pain.    [provider]  albuterol (VENTOLIN HFA) 108 (90 Base) MCG/ACT inhaler USE 2 PUFFS EVERY 6 HOURS AS NEEDED 07/09/22   Dettinger, Fransisca Kaufmann, MD  apixaban (ELIQUIS) 2.5 MG TABS tablet Take 1 tablet (2.5 mg total) by mouth 2 (two) times daily. 04/08/22   Dettinger, Fransisca Kaufmann, MD  Cholecalciferol (VITAMIN D3) 2000 units CHEW Chew 4,000 Units by mouth daily.    [provider]  diltiazem (CARDIZEM CD) 240 MG 24 hr capsule TAKE ONE CAPSULE BY MOUTH DAILY Patient not taking: Reported on 10/15/2022 07/29/22   Dettinger, Fransisca Kaufmann, MD  furosemide (LASIX) 20 MG tablet Take 1 tablet (20 mg total) by mouth daily. Patient not taking: Reported on 10/15/2022 05/12/22   Dettinger, Fransisca Kaufmann, MD  levothyroxine (SYNTHROID) 25 MCG tablet TAKE ONE (1) TABLET BY MOUTH EVERY DAY 07/29/22    Dettinger, Fransisca Kaufmann, MD  lidocaine (HM LIDOCAINE PATCH) 4 % Place 1 patch onto the skin daily. 10/15/22   Baruch Gouty, FNP  loperamide (IMODIUM) 2 MG capsule Take 1 capsule (2 mg total) by mouth 4 (four) times daily as needed for diarrhea or loose stools. Patient not taking: Reported on 10/15/2022 03/27/22   Kathie Dike, MD  metoprolol tartrate (LOPRESSOR) 25 MG tablet TAKE 1 TABLET BY MOUTH TWICE DAILY AS DIRECTED 09/03/22   Dettinger, Fransisca Kaufmann, MD  mirtazapine (REMERON SOL-TAB) 15 MG disintegrating tablet Take 1 tablet (15 mg total) by mouth at bedtime. Patient not taking: Reported on 10/15/2022 05/12/22   Dettinger, Fransisca Kaufmann, MD  Misc. Devices (COMMODE BEDSIDE) MISC Bedside commode due to tibial fracture 07/17/20   Triplett, Tammy, PA-C  Misc. Devices [facility]) MISC Pt needs wheelchair due to extremity fractures 07/17/20   Triplett, Tammy, PA-C  Misc. Devices MISC Use rolling walker to ambulate around the house as needed. 07/29/20   Mordecai Rasmussen, MD  mometasone (NASONEX) 50 MCG/ACT nasal spray Place 2 sprays into the nose daily. 03/09/22   Ivy Lynn, NP  omeprazole (PRILOSEC) 40 MG capsule Take 1 capsule (40 mg total) by mouth daily. 07/28/22   Dettinger, Fransisca Kaufmann, MD  ondansetron (ZOFRAN-ODT) 4 MG disintegrating tablet TAKE 1 TABLET BY MOUTH EVERY 8 HOURS AS NEEDED FOR NAUSEA & VOMITING Patient not taking: Reported on 10/15/2022  07/08/22   Dettinger, Fransisca Kaufmann, MD  oxazepam (SERAX) 15 MG capsule Take 1 capsule (15 mg total) by mouth 3 (three) times daily as needed for sleep. Cancel any other prescriptions on file 07/28/22   Dettinger, Fransisca Kaufmann, MD  polyethylene glycol (MIRALAX / GLYCOLAX) packet Take 17 g by mouth daily as needed for moderate constipation.  Patient not taking: Reported on 10/15/2022    [provider]  potassium chloride 20 MEQ/15ML (10%) SOLN TAKE 7.5ML (10MEQ) BY MOUTH DAILY AS NEEDED FOR CRAMPING Patient taking differently: Take 10 mEq by mouth daily as  needed (cramping). 03/26/22   Dettinger, Fransisca Kaufmann, MD  sucralfate (CARAFATE) 1 g tablet Take 1 tablet (1 g total) by mouth 2 (two) times daily. Patient not taking: Reported on 10/15/2022 05/12/22   Dettinger, Fransisca Kaufmann, MD  triamcinolone ointment (KENALOG) 0.5 % APPLY TO THE AFFECTED AREA(S) TOPICALLY TWICE DAILY Patient not taking: Reported on 10/15/2022 05/12/22   Dettinger, Fransisca Kaufmann, MD      Allergies    Bisphosphonates, Ciprofloxacin, Codeine, and Sulfa antibiotics    Review of Systems   Review of Systems  Physical Exam Updated Vital Signs BP (!) 146/71   Pulse 97   Temp 98.3 F (36.8 C) (Oral)   Resp 18   Ht '5\' 1"'$  (1.549 m)   Wt 45.8 kg   SpO2 92%   BMI 19.08 kg/m  Physical Exam Vitals and nursing note reviewed.  Constitutional:      Appearance: She is well-developed.  HENT:     Head: Normocephalic and atraumatic.     Mouth/Throat:     Mouth: Mucous membranes are moist.     Pharynx: Oropharynx is clear.  Eyes:     Pupils: Pupils are equal, round, and reactive to light.  Cardiovascular:     Rate and Rhythm: Normal rate and regular rhythm.  Pulmonary:     Effort: No respiratory distress.     Breath sounds: No stridor.  Abdominal:     General: Abdomen is flat. There is no distension.  Musculoskeletal:        General: Tenderness (with associated hematoma over xiphoid process area) present. Normal range of motion.     Cervical back: Normal range of motion.  Skin:    General: Skin is warm and dry.  Neurological:     General: No focal deficit present.     Mental Status: She is alert.     ED Results / Procedures / Treatments   Labs (all labs ordered are listed, but only abnormal results are displayed) Labs Reviewed  BASIC METABOLIC PANEL - Abnormal; Notable for the following components:      Result Value   Glucose, Bld 135 (*)    Creatinine, Ser 1.32 (*)    GFR, Estimated 40 (*)    All other components within normal limits  CBC - Abnormal; Notable for the  following components:   WBC 14.1 (*)    RDW 17.2 (*)    All other components within normal limits  URINALYSIS, ROUTINE W REFLEX MICROSCOPIC  TROPONIN I (HIGH SENSITIVITY)  TROPONIN I (HIGH SENSITIVITY)    EKG EKG Interpretation  Date/Time:  Monday October 18 2022 22:29:17 EST Ventricular Rate:  85 PR Interval:    QRS Duration: 120 QT Interval:  390 QTC Calculation: 464 R Axis:   105 Text Interpretation: Atrial fibrillation Right bundle branch block Abnormal ECG When compared with ECG of 04-Jun-2022 05:27, PREVIOUS ECG IS PRESENT Confirmed by Merrily Pew [phone])  on 10/18/2022 11:06:02 PM  Radiology CT CHEST ABDOMEN PELVIS W CONTRAST  Result Date: 10/19/2022 CLINICAL DATA:  Polytrauma, blunt. EXAM: CT CHEST, ABDOMEN, AND PELVIS WITH CONTRAST TECHNIQUE: Multidetector CT imaging of the chest, abdomen and pelvis was performed following the standard protocol during bolus administration of intravenous contrast. RADIATION DOSE REDUCTION: This exam was performed according to the departmental dose-optimization program which includes automated exposure control, adjustment of the mA and/or kV according to patient size and/or use of iterative reconstruction technique. CONTRAST:  33m OMNIPAQUE IOHEXOL 300 MG/ML  SOLN COMPARISON:  05/13/2021. FINDINGS: CT CHEST FINDINGS Cardiovascular: Heart is enlarged and there is no pericardial effusion. Multi-vessel coronary artery calcifications are noted. There is atherosclerotic calcification of the aorta without evidence of aneurysm. The pulmonary trunk is distended suggesting underlying pulmonary artery hypertension. Mediastinum/Nodes: No mediastinal, hilar, or axillary lymphadenopathy. 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 There is heterogeneous enlargement of the isthmus and left lobe of the thyroid with scattered hypodense nodules measuring up to 1 cm. The trachea and esophagus are within normal limits. There is a small hiatal hernia.  Lungs/Pleura: Centrilobular emphysematous changes are present in the lungs. Apical pleural scarring is noted bilaterally. There is a trace right pleural effusion with atelectasis at the lung bases. No pneumothorax. Few scattered pulmonary nodules are noted bilaterally, the largest measuring 5 mm in the left upper lobe, axial image 42. Musculoskeletal: Mild degenerative changes in the thoracic spine. Multiple compression deformities are noted in the thoracic spine with kyphosis, indeterminate in age. There are kyphoplasty changes at T9. CT ABDOMEN PELVIS FINDINGS Hepatobiliary: No focal liver abnormality is seen. Mild hepatic steatosis. No gallstones, gallbladder wall thickening, or biliary dilatation. Pancreas: Unremarkable. No pancreatic ductal dilatation or surrounding inflammatory changes. Spleen: Normal in size without focal abnormality. Adrenals/Urinary Tract: No adrenal nodule or mass. The kidneys enhance symmetrically. No renal calculus or hydronephrosis. The bladder is unremarkable. Stomach/Bowel: There is a small hiatal hernia. Stomach is within normal limits. Appendix appears normal. There is a right inguinal hernia containing nonobstructed small bowel. No evidence of bowel wall thickening, distention, or inflammatory changes. No free air or pneumatosis. A few scattered diverticula are present along the colon without evidence of diverticulitis. Vascular/Lymphatic: Aortic atherosclerosis. No enlarged abdominal or pelvic lymph nodes. Reproductive: Status post hysterectomy. No adnexal masses. Other: No abdominopelvic ascites. Musculoskeletal: Stable compression deformities in the lumbar spine at L1 and L5. No acute osseous abnormality is seen. IMPRESSION: 1. Small right pleural effusion with atelectasis at the right lung base. 2. Scattered pulmonary nodules measuring up to 5 mm. No follow-up needed if patient is low-risk.This recommendation follows the consensus statement: Guidelines for Management of  Incidental Pulmonary Nodules Detected on CT Images: From the Fleischner Society 2017; Radiology 2017; 284:228-243. 3. Cardiomegaly with multi-vessel coronary artery calcifications. 4. Distended pulmonary trunk suggesting underlying pulmonary artery hypertension. 5. Emphysema. 6. Aortic atherosclerosis. 7. Multilevel compression deformities in the thoracic spine, indeterminate in age. Stable compression deformities in the lumbar spine. Electronically Signed   By: LBrett FairyM.D.   On: 10/19/2022 01:04   DG Chest Port 1 View  Result Date: 10/18/2022 CLINICAL DATA:  Chest pain, right-sided flank pain. EXAM: PORTABLE CHEST 1 VIEW COMPARISON:  10/15/2022, 06/03/2022. FINDINGS: The heart is enlarged and the mediastinal contour is stable. There is atherosclerotic calcification of the aorta. Apical pleural scarring is noted on the right. There is a small right pleural effusion with atelectasis at the right lung base. No pneumothorax. Kyphoplasty changes  are noted in the midthoracic spine. Surgical clips are noted in the cervical soft tissues on the right. IMPRESSION: 1. Small right pleural effusion with atelectasis at the right lung base. 2. Cardiomegaly. Electronically Signed   By: Brett Fairy M.D.   On: 10/18/2022 23:10    Procedures Procedures    Medications Ordered in ED Medications  fentaNYL (SUBLIMAZE) injection 50 mcg (50 mcg Intravenous Given 10/19/22 0051)  lactated ringers bolus 500 mL (0 mLs Intravenous Stopped 10/19/22 0128)  iohexol (OMNIPAQUE) 300 MG/ML solution 60 mL (60 mLs Intravenous Contrast Given 10/19/22 0043)    ED Course/ Medical Decision Making/ A&P                           Medical Decision Making Amount and/or Complexity of Data Reviewed Labs: ordered. Radiology: ordered.  Risk Prescription drug management.   Pleural effusion found with associated atelectasis. No pneumonia. Unclear on etiology but not causing any respiratory issues or hemodynamic issues suggesting need  for further workup or hospitalization at this time. Will fu PCP for further management of symptoms.   Final Clinical Impression(s) / ED Diagnoses Final diagnoses:  Pleural effusion  Hematoma of chest wall, unspecified laterality, initial encounter    Rx / DC Orders ED Discharge Orders          Ordered    oxyCODONE-acetaminophen (PERCOCET/ROXICET) 5-325 MG tablet  Every 6 hours PRN        10/19/22 0145              Chloe Baig, Corene Cornea, MD 10/19/22 7939

## 2022-10-21 ENCOUNTER — Telehealth: Payer: Self-pay

## 2022-10-21 NOTE — Telephone Encounter (Signed)
        Patient  visited [place] on 1/2    Telephone encounter attempt :  1st  A HIPAA compliant voice message was left requesting a return call.  Instructed patient to call back   [place], [facility]  [phone] [address], [place], [place] [address] Phone: [phone] Email: [email or web address]

## 2022-10-22 ENCOUNTER — Telehealth: Payer: Self-pay

## 2022-10-22 NOTE — Telephone Encounter (Signed)
        Patient  visited [place] on 1/2     Telephone encounter attempt :  2nd  A HIPAA compliant voice message was left requesting a return call.  Instructed patient to call back     [place], [facility]  [phone] [address], [place], [place] [address] Phone: [phone] Email: [email or web address]

## 2022-10-25 ENCOUNTER — Encounter: Payer: Self-pay | Admitting: Family Medicine

## 2022-10-25 ENCOUNTER — Ambulatory Visit (INDEPENDENT_AMBULATORY_CARE_PROVIDER_SITE_OTHER): Payer: Medicare HMO

## 2022-10-25 ENCOUNTER — Ambulatory Visit (INDEPENDENT_AMBULATORY_CARE_PROVIDER_SITE_OTHER): Payer: Medicare HMO | Admitting: Family Medicine

## 2022-10-25 VITALS — BP 129/76 | HR 106 | Temp 97.3°F | Ht 61.0 in | Wt 104.0 lb

## 2022-10-25 DIAGNOSIS — S20212D Contusion of left front wall of thorax, subsequent encounter: Secondary | ICD-10-CM | POA: Diagnosis not present

## 2022-10-25 DIAGNOSIS — T148XXA Other injury of unspecified body region, initial encounter: Secondary | ICD-10-CM

## 2022-10-25 DIAGNOSIS — S20213A Contusion of bilateral front wall of thorax, initial encounter: Secondary | ICD-10-CM | POA: Diagnosis not present

## 2022-10-25 DIAGNOSIS — R0789 Other chest pain: Secondary | ICD-10-CM

## 2022-10-25 DIAGNOSIS — J90 Pleural effusion, not elsewhere classified: Secondary | ICD-10-CM

## 2022-10-25 NOTE — Progress Notes (Signed)
BP 129/76   Pulse (!) 106   Temp (!) 97.3 F (36.3 C)   Ht '5\' 1"'$  (1.549 m)   Wt 104 lb (47.2 kg)   SpO2 95%   BMI 19.65 kg/m    Subjective:   Patient ID: [name], female    DOB: 1940/11/16, 82 y.o.   MRN: [id number]  HPI: [name] is a 82 y.o. female presenting on 10/25/2022 for ER follow up (Chest pain- CT of lung showed fluid right side)   HPI Patient is coming in today for ER follow-up.  Patient was in the emergency department on 10/18/2022 for chest pain was diagnosed with a chest wall contusion and had some bruising.  She was diagnosed with a hematoma over the xiphoid process.  She is also found to have a pleural effusion but could be atelectasis and recommended follow-up in the future for this.  The effusion was on the lower right lung and she also had some small scattered pulmonary nodules.  These were small enough that just recommended future follow-up.  Relevant past medical, surgical, family and social history reviewed and updated as indicated. Interim medical history since our last visit reviewed. Allergies and medications reviewed and updated.  Review of Systems  Constitutional:  Negative for chills and fever.  Eyes:  Negative for visual disturbance.  Respiratory:  Negative for cough, chest tightness, shortness of breath and wheezing.   Cardiovascular:  Positive for chest pain. Negative for palpitations and leg swelling.  Musculoskeletal:  Negative for back pain and gait problem.  Skin:  Negative for rash.  Neurological:  Negative for light-headedness and headaches.  Psychiatric/Behavioral:  Negative for agitation and behavioral problems.   All other systems reviewed and are negative.   Per HPI unless specifically indicated above   Allergies as of 10/25/2022       Reactions   Bisphosphonates Other (See Comments)   Difficulty swallowing   Ciprofloxacin Nausea And Vomiting   Codeine Nausea And Vomiting   Sulfa Antibiotics Nausea And Vomiting         Medication List        Accurate as of October 25, 2022 10:22 AM. If you have any questions, ask your nurse or doctor.          acetaminophen 500 MG tablet Commonly known as: TYLENOL Take 500 mg by mouth every 8 (eight) hours as needed for mild pain or moderate pain.   albuterol 108 (90 Base) MCG/ACT inhaler Commonly known as: VENTOLIN HFA USE 2 PUFFS EVERY 6 HOURS AS NEEDED   apixaban 2.5 MG Tabs tablet Commonly known as: ELIQUIS Take 1 tablet (2.5 mg total) by mouth 2 (two) times daily.   atorvastatin 80 MG tablet Commonly known as: LIPITOR Take 80 mg by mouth daily.   Commode Bedside Misc Bedside commode due to tibial fracture   Wheelchair Misc Pt needs wheelchair due to extremity fractures   Misc. Devices Misc Use rolling walker to ambulate around the house as needed.   diltiazem 240 MG 24 hr capsule Commonly known as: CARDIZEM CD TAKE ONE CAPSULE BY MOUTH DAILY   furosemide 20 MG tablet Commonly known as: LASIX Take 1 tablet (20 mg total) by mouth daily.   levothyroxine 25 MCG tablet Commonly known as: SYNTHROID TAKE ONE (1) TABLET BY MOUTH EVERY DAY   lidocaine 4 % Commonly known as: HM Lidocaine Patch Place 1 patch onto the skin daily.   loperamide 2 MG capsule Commonly known as: IMODIUM Take  1 capsule (2 mg total) by mouth 4 (four) times daily as needed for diarrhea or loose stools.   metoprolol tartrate 25 MG tablet Commonly known as: LOPRESSOR TAKE 1 TABLET BY MOUTH TWICE DAILY AS DIRECTED   mirtazapine 15 MG disintegrating tablet Commonly known as: REMERON SOL-TAB Take 1 tablet (15 mg total) by mouth at bedtime.   mometasone 50 MCG/ACT nasal spray Commonly known as: Nasonex Place 2 sprays into the nose daily.   omeprazole 40 MG capsule Commonly known as: PRILOSEC Take 1 capsule (40 mg total) by mouth daily.   ondansetron 4 MG disintegrating tablet Commonly known as: ZOFRAN-ODT TAKE 1 TABLET BY MOUTH EVERY 8 HOURS AS NEEDED FOR  NAUSEA & VOMITING   oxazepam 15 MG capsule Commonly known as: SERAX Take 1 capsule (15 mg total) by mouth 3 (three) times daily as needed for sleep. Cancel any other prescriptions on file   oxyCODONE-acetaminophen 5-325 MG tablet Commonly known as: PERCOCET/ROXICET Take 1 tablet by mouth every 6 (six) hours as needed for severe pain.   polyethylene glycol 17 g packet Commonly known as: MIRALAX / GLYCOLAX Take 17 g by mouth daily as needed for moderate constipation.   potassium chloride 20 MEQ/15ML (10%) Soln TAKE 7.5ML (10MEQ) BY MOUTH DAILY AS NEEDED FOR CRAMPING What changed: See the new instructions.   sucralfate 1 g tablet Commonly known as: CARAFATE Take 1 tablet (1 g total) by mouth 2 (two) times daily.   triamcinolone ointment 0.5 % Commonly known as: KENALOG APPLY TO THE AFFECTED AREA(S) TOPICALLY TWICE DAILY   Vitamin D3 50 MCG (2000 UT) Chew Chew 4,000 Units by mouth daily.         Objective:   BP 129/76   Pulse (!) 106   Temp (!) 97.3 F (36.3 C)   Ht '5\' 1"'$  (1.549 m)   Wt 104 lb (47.2 kg)   SpO2 95%   BMI 19.65 kg/m   Wt Readings from Last 3 Encounters:  10/25/22 104 lb (47.2 kg)  10/18/22 101 lb (45.8 kg)  10/15/22 106 lb (48.1 kg)    Physical Exam Vitals and nursing note reviewed.  Constitutional:      General: She is not in acute distress.    Appearance: She is well-developed. She is not diaphoretic.  Eyes:     Conjunctiva/sclera: Conjunctivae normal.  Cardiovascular:     Rate and Rhythm: Normal rate and regular rhythm.     Heart sounds: Normal heart sounds. No murmur heard. Pulmonary:     Effort: Pulmonary effort is normal. No respiratory distress.     Breath sounds: Normal breath sounds. No wheezing, rhonchi or rales.  Chest:     Chest wall: Tenderness (Lower central chest tenderness it is a process.  Bruising seems to be resolved at this point) present.  Musculoskeletal:        General: No tenderness. Normal range of motion.  Skin:     General: Skin is warm and dry.     Findings: No rash.  Neurological:     Mental Status: She is alert and oriented to person, place, and time.     Coordination: Coordination normal.  Psychiatric:        Behavior: Behavior normal.       Assessment & Plan:   Problem List Items Addressed This Visit   None Visit Diagnoses     Pleural effusion    -  Primary   Relevant Orders   DG Chest 2 View   Incentive  spirometry RT   Chest wall pain       Relevant Orders   DG Chest 2 View   Incentive spirometry RT   Hematoma       Relevant Orders   DG Chest 2 View   Incentive spirometry RT       Gave a handwritten prescription for an incentive spirometer. Follow up plan: Return if symptoms worsen or fail to improve.  Counseling provided for all of the vaccine components Orders Placed This Encounter  Procedures   DG Chest 2 View   Incentive spirometry RT    Caryl Pina, MD [facility] 10/25/2022, 10:22 AM

## 2022-11-04 ENCOUNTER — Other Ambulatory Visit: Payer: Self-pay | Admitting: Family Medicine

## 2022-11-04 DIAGNOSIS — F411 Generalized anxiety disorder: Secondary | ICD-10-CM

## 2022-11-25 ENCOUNTER — Telehealth: Payer: Medicare HMO | Admitting: Family Medicine

## 2022-11-25 NOTE — Telephone Encounter (Signed)
[employer] calling about an appeal and asked to talk to nurse regarding oxazepam (SERAX) 15 MG capsule rx  Case ref # [id number]

## 2022-11-25 NOTE — Telephone Encounter (Signed)
APPEAL WAS ORIGINALLY DENIED BUT HAS BEEN REOPENED, A DECISION WILL BE REACHED WITHIN THE NEXT 7 DAYS

## 2022-11-29 ENCOUNTER — Ambulatory Visit: Payer: Medicare HMO | Admitting: Family Medicine

## 2022-11-29 ENCOUNTER — Ambulatory Visit (INDEPENDENT_AMBULATORY_CARE_PROVIDER_SITE_OTHER): Payer: Medicare HMO | Admitting: Family Medicine

## 2022-11-29 ENCOUNTER — Encounter: Payer: Self-pay | Admitting: Family Medicine

## 2022-11-29 VITALS — BP 131/85 | HR 84 | Ht 61.0 in | Wt 103.0 lb

## 2022-11-29 DIAGNOSIS — R7303 Prediabetes: Secondary | ICD-10-CM | POA: Diagnosis not present

## 2022-11-29 DIAGNOSIS — E782 Mixed hyperlipidemia: Secondary | ICD-10-CM | POA: Diagnosis not present

## 2022-11-29 DIAGNOSIS — N1832 Chronic kidney disease, stage 3b: Secondary | ICD-10-CM | POA: Diagnosis not present

## 2022-11-29 DIAGNOSIS — E034 Atrophy of thyroid (acquired): Secondary | ICD-10-CM | POA: Diagnosis not present

## 2022-11-29 DIAGNOSIS — I10 Essential (primary) hypertension: Secondary | ICD-10-CM

## 2022-11-29 DIAGNOSIS — E119 Type 2 diabetes mellitus without complications: Secondary | ICD-10-CM | POA: Diagnosis not present

## 2022-11-29 DIAGNOSIS — R69 Illness, unspecified: Secondary | ICD-10-CM | POA: Diagnosis not present

## 2022-11-29 DIAGNOSIS — I129 Hypertensive chronic kidney disease with stage 1 through stage 4 chronic kidney disease, or unspecified chronic kidney disease: Secondary | ICD-10-CM | POA: Diagnosis not present

## 2022-11-29 DIAGNOSIS — Z79891 Long term (current) use of opiate analgesic: Secondary | ICD-10-CM | POA: Diagnosis not present

## 2022-11-29 DIAGNOSIS — F411 Generalized anxiety disorder: Secondary | ICD-10-CM

## 2022-11-29 LAB — LIPID PANEL

## 2022-11-29 LAB — BAYER DCA HB A1C WAIVED: HB A1C (BAYER DCA - WAIVED): 6.6 % — ABNORMAL HIGH (ref 4.8–5.6)

## 2022-11-29 MED ORDER — OXAZEPAM 15 MG PO CAPS: 15.0000 mg | ORAL_CAPSULE | Freq: Three times a day (TID) | ORAL | 3 refills | Status: DC | PRN

## 2022-11-29 NOTE — Progress Notes (Signed)
BP 131/85   Pulse 84   Ht 5' 1"$  (1.549 m)   Wt 103 lb (46.7 kg)   SpO2 98%   BMI 19.46 kg/m    Subjective:   Patient ID: [name], female    DOB: Jul 16, 1941, 82 y.o.   MRN: [id number]  HPI: [name] is a 82 y.o. female presenting on 11/29/2022 for Medical Management of Chronic Issues, Hyperlipidemia, Hypertension, Hypothyroidism, and Prediabetes   HPI Anxiety recheck Current rx-oxazepam 15 mg 3 times daily. # meds rx-90/month Effectiveness of current meds-works well Adverse reactions form meds-denies any issues  Pill count performed-No Last drug screen -12/16/2021 ( high risk q25m moderate risk q697mlow risk yearly ) [place] today- [place]  If [place] were their any concerning findings? -None No flowsheet data found. Controlled substance contract signed on: Today  Hypertension Patient is currently on diltiazem and furosemide and metoprolol, and their blood pressure today is 131/85. Patient denies any lightheadedness or dizziness. Patient denies headaches, blurred vision, chest pains, shortness of breath, or weakness. Denies any side effects from medication and is content with current medication.   Hyperlipidemia Patient is coming in for recheck of his hyperlipidemia. The patient is currently taking atorvastatin. They deny any issues with myalgias or history of liver damage from it. They deny any focal numbness or weakness or chest pain.   Hypothyroidism recheck Patient is coming in for thyroid recheck today as well. They deny any issues with hair changes or heat or cold problems or diarrhea or constipation. They deny any chest pain or palpitations. They are currently on levothyroxine 25 micrograms   prediabetes Patient comes in today for recheck of his diabetes. Patient has been currently taking no medicine currently, diet control. Patient is not currently on an ACE inhibitor/ARB. Patient has not seen an ophthalmologist this year.  Patient denies any issues with their feet. The symptom started onset as an adult A-fib and CHF and hypertension ARE RELATED TO DM   Relevant past medical, surgical, family and social history reviewed and updated as indicated. Interim medical history since our last visit reviewed. Allergies and medications reviewed and updated.  Review of Systems  Constitutional:  Negative for chills and fever.  Eyes:  Negative for visual disturbance.  Respiratory:  Negative for chest tightness and shortness of breath.   Cardiovascular:  Negative for chest pain and leg swelling.  Musculoskeletal:  Negative for back pain and gait problem.  Skin:  Negative for rash.  Neurological:  Negative for dizziness, light-headedness and headaches.  Psychiatric/Behavioral:  Negative for agitation and behavioral problems.   All other systems reviewed and are negative.   Per HPI unless specifically indicated above   Allergies as of 11/29/2022       Reactions   Bisphosphonates Other (See Comments)   Difficulty swallowing   Ciprofloxacin Nausea And Vomiting   Codeine Nausea And Vomiting   Sulfa Antibiotics Nausea And Vomiting        Medication List        Accurate as of November 29, 2022  9:51 AM. If you have any questions, ask your nurse or doctor.          STOP taking these medications    oxyCODONE-acetaminophen 5-325 MG tablet Commonly known as: PERCOCET/ROXICET Stopped by: JoFransisca Kaufmannettinger, MD       TAKE these medications    acetaminophen 500 MG tablet Commonly known as: TYLENOL Take 500 mg by mouth every 8 (  eight) hours as needed for mild pain or moderate pain.   albuterol 108 (90 Base) MCG/ACT inhaler Commonly known as: VENTOLIN HFA USE 2 PUFFS EVERY 6 HOURS AS NEEDED   apixaban 2.5 MG Tabs tablet Commonly known as: ELIQUIS Take 1 tablet (2.5 mg total) by mouth 2 (two) times daily.   atorvastatin 80 MG tablet Commonly known as: LIPITOR Take 80 mg by mouth daily.   Commode  Bedside Misc Bedside commode due to tibial fracture   Wheelchair Misc Pt needs wheelchair due to extremity fractures   Misc. Devices Misc Use rolling walker to ambulate around the house as needed.   diltiazem 240 MG 24 hr capsule Commonly known as: CARDIZEM CD TAKE ONE CAPSULE BY MOUTH DAILY   furosemide 20 MG tablet Commonly known as: LASIX Take 1 tablet (20 mg total) by mouth daily.   levothyroxine 25 MCG tablet Commonly known as: SYNTHROID TAKE ONE (1) TABLET BY MOUTH EVERY DAY   lidocaine 4 % Commonly known as: HM Lidocaine Patch Place 1 patch onto the skin daily.   loperamide 2 MG capsule Commonly known as: IMODIUM Take 1 capsule (2 mg total) by mouth 4 (four) times daily as needed for diarrhea or loose stools.   metoprolol tartrate 25 MG tablet Commonly known as: LOPRESSOR TAKE 1 TABLET BY MOUTH TWICE DAILY AS DIRECTED   mirtazapine 15 MG disintegrating tablet Commonly known as: REMERON SOL-TAB Take 1 tablet (15 mg total) by mouth at bedtime.   mometasone 50 MCG/ACT nasal spray Commonly known as: Nasonex Place 2 sprays into the nose daily.   omeprazole 40 MG capsule Commonly known as: PRILOSEC Take 1 capsule (40 mg total) by mouth daily.   ondansetron 4 MG disintegrating tablet Commonly known as: ZOFRAN-ODT TAKE 1 TABLET BY MOUTH EVERY 8 HOURS AS NEEDED FOR NAUSEA & VOMITING   oxazepam 15 MG capsule Commonly known as: SERAX Take 1 capsule (15 mg total) by mouth 3 (three) times daily as needed for sleep. Cancel any other prescriptions on file   polyethylene glycol 17 g packet Commonly known as: MIRALAX / GLYCOLAX Take 17 g by mouth daily as needed for moderate constipation.   potassium chloride 20 MEQ/15ML (10%) Soln TAKE 7.5ML (10MEQ) BY MOUTH DAILY AS NEEDED FOR CRAMPING What changed: See the new instructions.   sucralfate 1 g tablet Commonly known as: CARAFATE Take 1 tablet (1 g total) by mouth 2 (two) times daily.   triamcinolone ointment 0.5  % Commonly known as: KENALOG APPLY TO THE AFFECTED AREA(S) TOPICALLY TWICE DAILY   Vitamin D3 50 MCG (2000 UT) Chew Chew 4,000 Units by mouth daily.         Objective:   BP 131/85   Pulse 84   Ht 5' 1"$  (1.549 m)   Wt 103 lb (46.7 kg)   SpO2 98%   BMI 19.46 kg/m   Wt Readings from Last 3 Encounters:  11/29/22 103 lb (46.7 kg)  10/25/22 104 lb (47.2 kg)  10/18/22 101 lb (45.8 kg)    Physical Exam Vitals and nursing note reviewed.  Constitutional:      General: She is not in acute distress.    Appearance: She is well-developed. She is not diaphoretic.  Eyes:     Conjunctiva/sclera: Conjunctivae normal.  Cardiovascular:     Rate and Rhythm: Normal rate and regular rhythm.     Heart sounds: Normal heart sounds. No murmur heard. Pulmonary:     Effort: Pulmonary effort is normal. No respiratory distress.  Breath sounds: Normal breath sounds. No wheezing.  Musculoskeletal:        General: No swelling or tenderness. Normal range of motion.  Skin:    General: Skin is warm and dry.     Findings: No rash.  Neurological:     Mental Status: She is alert and oriented to person, place, and time.     Coordination: Coordination normal.  Psychiatric:        Behavior: Behavior normal.     Results for orders placed or performed during the hospital encounter of [id number]  Basic metabolic panel  Result Value Ref Range   Sodium 136 135 - 145 mmol/L   Potassium 4.5 3.5 - 5.1 mmol/L   Chloride 104 98 - 111 mmol/L   CO2 24 22 - 32 mmol/L   Glucose, Bld 135 (H) 70 - 99 mg/dL   BUN 20 8 - 23 mg/dL   Creatinine, Ser 1.32 (H) 0.44 - 1.00 mg/dL   Calcium 9.1 8.9 - 10.3 mg/dL   GFR, Estimated 40 (L) >60 mL/min   Anion gap 8 5 - 15  CBC  Result Value Ref Range   WBC 14.1 (H) 4.0 - 10.5 K/uL   RBC 4.24 3.87 - 5.11 MIL/uL   Hemoglobin 12.1 12.0 - 15.0 g/dL   HCT 38.6 36.0 - 46.0 %   MCV 91.0 80.0 - 100.0 fL   MCH 28.5 26.0 - 34.0 pg   MCHC 31.3 30.0 - 36.0 g/dL   RDW 17.2 (H)  11.5 - 15.5 %   Platelets 289 150 - 400 K/uL   nRBC 0.0 0.0 - 0.2 %  Troponin I (High Sensitivity)  Result Value Ref Range   Troponin I (High Sensitivity) 5 <18 ng/L  Troponin I (High Sensitivity)  Result Value Ref Range   Troponin I (High Sensitivity) 5 <18 ng/L    Assessment & Plan:   Problem List Items Addressed This Visit       Cardiovascular and Mediastinum   Essential hypertension   Relevant Orders   CBC with Differential/Platelet   CMP14+EGFR   Lipid panel   Bayer DCA Hb A1c Waived     Endocrine   Hypothyroid   Relevant Orders   CBC with Differential/Platelet   CMP14+EGFR   Lipid panel   Bayer DCA Hb A1c Waived   TSH     Genitourinary   Chronic kidney disease (CKD) stage G3b/A1, moderately decreased glomerular filtration rate (GFR) between 30-44 mL/min/1.73 square meter and albuminuria creatinine ratio less than 30 mg/g (HCC)   Relevant Orders   CBC with Differential/Platelet   CMP14+EGFR   Lipid panel   Microalbumin / creatinine urine ratio     Other   Hyperlipemia   Relevant Orders   CBC with Differential/Platelet   CMP14+EGFR   Lipid panel   Bayer DCA Hb A1c Waived   Generalized anxiety disorder   Relevant Medications   oxazepam (SERAX) 15 MG capsule   Other Relevant Orders   ToxASSURE Select 13 (MW), Urine   Prediabetes - Primary   Relevant Orders   Lipid panel   Bayer DCA Hb A1c Waived   Microalbumin / creatinine urine ratio    Continue current medicine, will do blood work today and urine drug screen.  No changes Follow up plan: Return in about 4 months (around 03/30/2023), or if symptoms worsen or fail to improve, for Anxiety and hypertension and prediabetes and thyroid recheck.  Counseling provided for all of the vaccine components Orders Placed This  Encounter  Procedures   CBC with Differential/Platelet   CMP14+EGFR   Lipid panel   Bayer DCA Hb A1c Waived   TSH   ToxASSURE Select 13 (MW), Urine   Microalbumin / creatinine urine  ratio    Caryl Pina, MD [facility] 11/29/2022, 9:51 AM

## 2022-11-30 LAB — LIPID PANEL
Chol/HDL Ratio: 2.9 ratio (ref 0.0–4.4)
Cholesterol, Total: 134 mg/dL (ref 100–199)
HDL: 46 mg/dL (ref >39)
LDL Chol Calc (NIH): 70 mg/dL (ref 0–99)
Triglycerides: 96 mg/dL (ref 0–149)
VLDL Cholesterol Cal: 18 mg/dL (ref 5–40)

## 2022-11-30 LAB — CMP14+EGFR
ALT: 9 IU/L (ref 0–32)
AST: 18 IU/L (ref 0–40)
Albumin/Globulin Ratio: 1.4 (ref 1.2–2.2)
Albumin: 4.2 g/dL (ref 3.7–4.7)
Alkaline Phosphatase: 121 IU/L (ref 44–121)
BUN/Creatinine Ratio: 14 (ref 12–28)
BUN: 21 mg/dL (ref 8–27)
Bilirubin Total: 0.5 mg/dL (ref 0.0–1.2)
CO2: 26 mmol/L (ref 20–29)
Calcium: 9.6 mg/dL (ref 8.7–10.3)
Chloride: 100 mmol/L (ref 96–106)
Creatinine, Ser: 1.47 mg/dL — ABNORMAL HIGH (ref 0.57–1.00)
Globulin, Total: 3.1 g/dL (ref 1.5–4.5)
Glucose: 117 mg/dL — ABNORMAL HIGH (ref 70–99)
Potassium: 4.3 mmol/L (ref 3.5–5.2)
Sodium: 143 mmol/L (ref 134–144)
Total Protein: 7.3 g/dL (ref 6.0–8.5)
eGFR: 35 mL/min/{1.73_m2} — ABNORMAL LOW (ref >59)

## 2022-11-30 LAB — CBC WITH DIFFERENTIAL/PLATELET
Basophils Absolute: 0.1 10*3/uL (ref 0.0–0.2)
Basos: 1 %
EOS (ABSOLUTE): 0.4 10*3/uL (ref 0.0–0.4)
Eos: 5 %
Hematocrit: 38.4 % (ref 34.0–46.6)
Hemoglobin: 12.5 g/dL (ref 11.1–15.9)
Immature Grans (Abs): 0 10*3/uL (ref 0.0–0.1)
Immature Granulocytes: 0 %
Lymphocytes Absolute: 2.1 10*3/uL (ref 0.7–3.1)
Lymphs: 28 %
MCH: 29 pg (ref 26.6–33.0)
MCHC: 32.6 g/dL (ref 31.5–35.7)
MCV: 89 fL (ref 79–97)
Monocytes Absolute: 0.6 10*3/uL (ref 0.1–0.9)
Monocytes: 9 %
Neutrophils Absolute: 4.3 10*3/uL (ref 1.4–7.0)
Neutrophils: 57 %
Platelets: 298 10*3/uL (ref 150–450)
RBC: 4.31 x10E6/uL (ref 3.77–5.28)
RDW: 15.4 % (ref 11.7–15.4)
WBC: 7.4 10*3/uL (ref 3.4–10.8)

## 2022-11-30 LAB — TSH: TSH: 0.517 u[IU]/mL (ref 0.450–4.500)

## 2022-12-02 LAB — TOXASSURE SELECT 13 (MW), URINE

## 2022-12-14 ENCOUNTER — Ambulatory Visit (INDEPENDENT_AMBULATORY_CARE_PROVIDER_SITE_OTHER): Payer: Medicare HMO

## 2022-12-14 VITALS — Ht 60.0 in | Wt 101.0 lb

## 2022-12-14 DIAGNOSIS — Z0000 Encounter for general adult medical examination without abnormal findings: Secondary | ICD-10-CM

## 2022-12-14 NOTE — Progress Notes (Signed)
Subjective:   [name] is a 82 y.o. female who presents for Medicare Annual (Subsequent) preventive examination. I connected with  [name] on 12/14/22 by a audio enabled telemedicine application and verified that I am speaking with the correct person using two identifiers.  Patient Location: Home  Provider Location: Home Office  I discussed the limitations of evaluation and management by telemedicine. The patient expressed understanding and agreed to proceed.  Review of Systems    Cardiac Risk Factors include: advanced age (>41mn, >>38women);hypertension;dyslipidemia     Objective:    Today's Vitals   12/14/22 1449  Weight: 101 lb (45.8 kg)  Height: 5' (1.524 m)   Body mass index is 19.73 kg/m.     12/14/2022    2:52 PM 10/18/2022   10:28 PM 06/03/2022    7:40 PM 03/26/2022    1:24 PM 12/11/2021    2:06 PM 05/13/2021    1:57 PM 11/19/2020   12:12 PM  Advanced Directives  Does Patient Have a Medical Advance Directive? No No No No No No No  Would patient like information on creating a medical advance directive? No - Patient declined No - Patient declined No - Patient declined No - Patient declined No - Patient declined  Yes (MAU/Ambulatory/Procedural Areas - Information given)    Current Medications (verified) Outpatient Encounter Medications as of 12/14/2022  Medication Sig   acetaminophen (TYLENOL) 500 MG tablet Take 500 mg by mouth every 8 (eight) hours as needed for mild pain or moderate pain.   albuterol (VENTOLIN HFA) 108 (90 Base) MCG/ACT inhaler USE 2 PUFFS EVERY 6 HOURS AS NEEDED   apixaban (ELIQUIS) 2.5 MG TABS tablet Take 1 tablet (2.5 mg total) by mouth 2 (two) times daily.   atorvastatin (LIPITOR) 80 MG tablet Take 80 mg by mouth daily.   Cholecalciferol (VITAMIN D3) 2000 units CHEW Chew 4,000 Units by mouth daily.   diltiazem (CARDIZEM CD) 240 MG 24 hr capsule TAKE ONE CAPSULE BY MOUTH DAILY   furosemide (LASIX) 20 MG tablet Take 1 tablet (20 mg  total) by mouth daily.   levothyroxine (SYNTHROID) 25 MCG tablet TAKE ONE (1) TABLET BY MOUTH EVERY DAY   lidocaine (HM LIDOCAINE PATCH) 4 % Place 1 patch onto the skin daily.   loperamide (IMODIUM) 2 MG capsule Take 1 capsule (2 mg total) by mouth 4 (four) times daily as needed for diarrhea or loose stools.   metoprolol tartrate (LOPRESSOR) 25 MG tablet TAKE 1 TABLET BY MOUTH TWICE DAILY AS DIRECTED   mirtazapine (REMERON SOL-TAB) 15 MG disintegrating tablet Take 1 tablet (15 mg total) by mouth at bedtime.   Misc. Devices (COMMODE BEDSIDE) MISC Bedside commode due to tibial fracture   Misc. Devices ([facility] MISC Pt needs wheelchair due to extremity fractures   Misc. Devices MISC Use rolling walker to ambulate around the house as needed.   mometasone (NASONEX) 50 MCG/ACT nasal spray Place 2 sprays into the nose daily.   omeprazole (PRILOSEC) 40 MG capsule Take 1 capsule (40 mg total) by mouth daily.   ondansetron (ZOFRAN-ODT) 4 MG disintegrating tablet TAKE 1 TABLET BY MOUTH EVERY 8 HOURS AS NEEDED FOR NAUSEA & VOMITING   oxazepam (SERAX) 15 MG capsule Take 1 capsule (15 mg total) by mouth 3 (three) times daily as needed for sleep. Cancel any other prescriptions on file   polyethylene glycol (MIRALAX / GLYCOLAX) packet Take 17 g by mouth daily as needed for moderate constipation.   potassium chloride  20 MEQ/15ML (10%) SOLN TAKE 7.5ML (10MEQ) BY MOUTH DAILY AS NEEDED FOR CRAMPING (Patient taking differently: Take 10 mEq by mouth daily as needed (cramping).)   sucralfate (CARAFATE) 1 g tablet Take 1 tablet (1 g total) by mouth 2 (two) times daily.   triamcinolone ointment (KENALOG) 0.5 % APPLY TO THE AFFECTED AREA(S) TOPICALLY TWICE DAILY   No facility-administered encounter medications on file as of 12/14/2022.    Allergies (verified) Bisphosphonates, Ciprofloxacin, Codeine, and Sulfa antibiotics   History: Past Medical History:  Diagnosis Date   A-fib [facility])    Anxiety states    Aortic  regurgitation 05/24/2022   Atrial fibrillation (HCC)    Breast cyst    Cancer (HCC)    skin   Cataract    Chronic diastolic heart failure ([place]) 05/24/2022   Constipation    Esophageal stricture    Essential hypertension 12/18/2015   Fracture, rib    GERD (gastroesophageal reflux disease)    Hiatal hernia    Lower extremity edema 12/18/2015   Mitral stenosis 05/24/2022   Osteoporosis    Other and unspecified hyperlipidemia    PAC (premature atrial contraction) 12/30/2015   Palpitations 12/18/2015   Unspecified hypothyroidism    Vertebral fracture, osteoporotic ([place])    Past Surgical History:  Procedure Laterality Date   ABDOMINAL HYSTERECTOMY     APPENDECTOMY     BREAST BIOPSY     bil cysts   CATARACT EXTRACTION     ESOPHAGOGASTRODUODENOSCOPY N/A 01/19/2018   Procedure: ESOPHAGOGASTRODUODENOSCOPY (EGD);  Surgeon: Clarene Essex, MD;  Location: [facility];  Service: Endoscopy;  Laterality: N/A;  with flouro   EYE SURGERY     HAND SURGERY Left    IR GENERIC HISTORICAL  12/10/2016   IR RADIOLOGIST EVAL & MGMT 12/10/2016 [facility]   IR GENERIC HISTORICAL  12/23/2016   IR KYPHO THORACIC WITH BONE BIOPSY 12/23/2016 Luanne Bras, MD [facility]   PARTIAL HYSTERECTOMY     THYROID LOBECTOMY     right   VERTEBROPLASTY     Family History  Problem Relation Age of Onset   Goiter Father    Alcohol abuse Father    Heart disease Sister    Cancer Sister        breast and skin   Goiter Brother    Dementia Brother    Hyperlipidemia Brother    Hernia Brother    Cancer Brother        throat   Cancer Brother        neck   Heart disease Brother    Early death Brother        auto accident   Social History   Socioeconomic History   Marital status: Married    Spouse name: Sports coach   Number of children: 0   Years of education: 8   Highest education level: 8th grade  Occupational History   Occupation: retired    Comment: [employer]  Tobacco Use   Smoking status:  Former    Types: Cigarettes    Quit date: 11/18/2005    Years since quitting: 17.0   Smokeless tobacco: Never   Tobacco comments:    Non-smoker  quit 12-13 years  Vaping Use   Vaping Use: Never used  Substance and Sexual Activity   Alcohol use: No   Drug use: No   Sexual activity: Not Currently    Birth control/protection: Surgical  Other Topics Concern   Not on file  Social History Narrative   Lives  home with husband, Alease Medina in one level home.   Social Determinants of Health   Financial Resource Strain: Low Risk  (12/14/2022)   Overall Financial Resource Strain (CARDIA)    Difficulty of Paying Living Expenses: Not hard at all  Food Insecurity: No Food Insecurity (12/14/2022)   Hunger Vital Sign    Worried About Running Out of Food in the Last Year: Never true    Ran Out of Food in the Last Year: Never true  Transportation Needs: No Transportation Needs (12/14/2022)   PRAPARE - Hydrologist (Medical): No    Lack of Transportation (Non-Medical): No  Physical Activity: Insufficiently Active (12/14/2022)   Exercise Vital Sign    Days of Exercise per Week: 3 days    Minutes of Exercise per Session: 30 min  Stress: No Stress Concern Present (12/14/2022)   [place]    Feeling of Stress : Not at all  Social Connections: Moderately Isolated (12/14/2022)   Social Connection and Isolation Panel [NHANES]    Frequency of Communication with Friends and Family: More than three times a week    Frequency of Social Gatherings with Friends and Family: More than three times a week    Attends Religious Services: Never    Marine scientist or Organizations: No    Attends Music therapist: Never    Marital Status: Married    Tobacco Counseling Counseling given: Not Answered Tobacco comments: Non-smoker  quit 12-13 years   Clinical Intake:  Pre-visit preparation completed:  Yes  Pain : No/denies pain     Nutritional Risks: None Diabetes: No  How often do you need to have someone help you when you read instructions, pamphlets, or other written materials from your doctor or pharmacy?: 1 - Never  Diabetic?no   Interpreter Needed?: No  Information entered by :: Jadene Pierini, LPN   Activities of Daily Living    12/14/2022    2:52 PM 06/04/2022   11:00 AM  In your present state of health, do you have any difficulty performing the following activities:  Hearing? 0   Vision? 0   Difficulty concentrating or making decisions? 0   Walking or climbing stairs? 0   Dressing or bathing? 0   Doing errands, shopping? 0 0  Preparing Food and eating ? N   Using the Toilet? N   In the past six months, have you accidently leaked urine? N   Do you have problems with loss of bowel control? N   Managing your Medications? N   Managing your Finances? N   Housekeeping or managing your Housekeeping? N     Patient Care Team: Dettinger, Fransisca Kaufmann, MD as PCP - General (Family Medicine) Skeet Latch, MD as PCP - Cardiology (Cardiology) Minus Breeding, MD (Cardiology) Clarene Essex, MD (Gastroenterology) Skeet Latch, MD as Attending Physician (Cardiology) Sandford Craze, MD as Referring Physician (Dermatology) Lavera Guise, [facility] as Pharmacist (Family Medicine)  Indicate any recent Medical Services you may have received from other than [facility] providers in the past year (date may be approximate).     Assessment:   This is a routine wellness examination for Marieanne.  Hearing/Vision screen Vision Screening - Comments:: Wears rx glasses - up to date with routine eye exams with  Dr.Johnson   Dietary issues and exercise activities discussed: Current Exercise Habits: Home exercise routine, Type of exercise: walking, Time (Minutes): 30, Frequency (Times/Week): 3, Weekly Exercise (  Minutes/Week): 90, Intensity: Mild, Exercise limited by: None identified   Goals  Addressed             This Visit's Progress    DIET - INCREASE WATER INTAKE   On track    Try to drink 6-8 glasses of water daily  11/19/2020 AWV Goal: Improved Nutrition/Diet  Patient will verbalize understanding that diet plays an important role in overall health and that a poor diet is a risk factor for many chronic medical conditions.  Over the next year, patient will improve self management of their diet by incorporating better variety, improved meal pattern, more consistent meal timing, increased physical activity, and improved protein intake. Patient will utilize available community resources to help with food acquisition if needed (ex: food pantries, [address], etc) Patient will work with nutrition specialist if a referral was made        Depression Screen    12/14/2022    2:51 PM 11/29/2022    9:22 AM 10/25/2022   10:03 AM 07/28/2022   10:51 AM 04/26/2022    2:55 PM 04/08/2022    9:17 AM 03/09/2022    9:46 AM  PHQ 2/9 Scores  PHQ - 2 Score 0 0 '2 2 2 2 2  '$ PHQ- 9 Score 0 '1 5 5 7 7 7    '$ Fall Risk    12/14/2022    2:50 PM 11/29/2022    9:22 AM 10/25/2022   10:03 AM 07/28/2022   10:50 AM 04/26/2022    2:55 PM  [place] in the past year? 0 0 0 1 1  Number falls in past yr: 0   1 0  Injury with Fall? 0   1 1  Risk for fall due to : No Fall Risks   Impaired balance/gait Impaired balance/gait  Follow up Falls prevention discussed   Falls evaluation completed Falls evaluation completed    [place]:  Any stairs in or around the home? No  If so, are there any without handrails? No  Home free of loose throw rugs in walkways, pet beds, electrical cords, etc? Yes  Adequate lighting in your home to reduce risk of falls? Yes   ASSISTIVE DEVICES UTILIZED TO PREVENT FALLS:  Life alert? No  Use of a cane, walker or w/c? No  Grab bars in the bathroom? Yes  Shower chair or bench in shower? Yes  Elevated toilet seat or a handicapped  toilet? No       11/16/2018    2:26 PM 08/16/2017    2:30 PM 05/28/2015   12:48 PM  MMSE - Mini Mental State Exam  Orientation to time '5 5 5  '$ Orientation to Place '5 5 5  '$ Registration '3 3 3  '$ Attention/ Calculation '5 3 5  '$ Recall '2 3 3  '$ Language- name 2 objects '2 2 2  '$ Language- repeat '1 1 1  '$ Language- follow 3 step command '2 3 2  '$ Language- read & follow direction '1 1 1  '$ Write a sentence '1 1 1  '$ Copy design '1 1 1  '$ Total score '28 28 29        '$ 12/14/2022    2:53 PM 12/11/2021    1:46 PM 11/19/2020   12:14 PM 11/19/2019   11:21 AM  6CIT Screen  What Year? 0 points 0 points 0 points 0 points  What month? 0 points 0 points 0 points 0 points  What time? 0 points 0 points  0 points 0 points  Count back from 20 0 points 0 points 0 points 4 points  Months in reverse 2 points 4 points 2 points 4 points  Repeat phrase 2 points 6 points 2 points 10 points  Total Score 4 points 10 points 4 points 18 points    Immunizations Immunization History  Administered Date(s) Administered   Fluad Quad(high Dose 65+) 07/31/2019, 08/06/2020, 07/27/2021   Influenza Whole 06/18/2010   Influenza, High Dose Seasonal PF 08/19/2016, 08/02/2017, 08/01/2018   Influenza,inj,Quad PF,6+ Mos 07/23/2013, 07/17/2014, 08/06/2015   Moderna Sars-Covid-2 Vaccination 01/02/2020, 01/30/2020   Pneumococcal Conjugate-13 11/05/2013   Pneumococcal Polysaccharide-23 10/18/2004   Td 08/18/2010   Tdap 08/12/2011, 03/26/2022   Zoster Recombinat (Shingrix) 11/27/2021, 03/26/2022   Zoster, Live 08/26/2010    TDAP status: Up to date  Flu Vaccine status: Up to date  Pneumococcal vaccine status: Up to date  Covid-19 vaccine status: Completed vaccines  Qualifies for Shingles Vaccine? Yes   Zostavax completed Yes   Shingrix Completed?: Yes  Screening Tests Health Maintenance  Topic Date Due   Diabetic kidney evaluation - Urine ACR  Never done   COVID-19 Vaccine (3 - 2023-24 season) 06/18/2022   DEXA SCAN  09/22/2022    INFLUENZA VACCINE  01/16/2023 (Originally 05/18/2022)   MAMMOGRAM  07/25/2023 (Originally 11/12/2021)   Diabetic kidney evaluation - eGFR measurement  11/30/2023   Medicare Annual Wellness (AWV)  12/15/2023   DTaP/Tdap/Td (4 - Td or Tdap) 03/26/2032   Pneumonia Vaccine 5+ Years old  Completed   Zoster Vaccines- Shingrix  Completed   HPV VACCINES  Aged Out    Health Maintenance  Health Maintenance Due  Topic Date Due   Diabetic kidney evaluation - Urine ACR  Never done   COVID-19 Vaccine (3 - 2023-24 season) 06/18/2022   DEXA SCAN  09/22/2022    Colorectal cancer screening: No longer required.   Mammogram status: No longer required due to age.  Bone Density status: Ordered declined . Pt provided with contact info and advised to call to schedule appt.  Lung Cancer Screening: (Low Dose CT Chest recommended if Age 92-80 years, 30 pack-year currently smoking OR have quit w/in 15years.) does not qualify.   Lung Cancer Screening Referral: n/a  Additional Screening:  Hepatitis C Screening: does not qualify;   Vision Screening: Recommended annual ophthalmology exams for early detection of glaucoma and other disorders of the eye. Is the patient up to date with their annual eye exam?  Yes  Who is the provider or what is the name of the office in which the patient attends annual eye exams? Dr.Johnson  If pt is not established with a provider, would they like to be referred to a provider to establish care? No .   Dental Screening: Recommended annual dental exams for proper oral hygiene  Community Resource Referral / Chronic Care Management: CRR required this visit?  No   CCM required this visit?  No      Plan:     I have personally reviewed and noted the following in the patient's chart:   Medical and social history Use of alcohol, tobacco or illicit drugs  Current medications and supplements including opioid prescriptions. Patient is not currently taking opioid  prescriptions. Functional ability and status Nutritional status Physical activity Advanced directives List of other physicians Hospitalizations, surgeries, and ER visits in previous 12 months Vitals Screenings to include cognitive, depression, and falls Referrals and appointments  In addition, I have reviewed and discussed with  patient certain preventive protocols, quality metrics, and best practice recommendations. A written personalized care plan for preventive services as well as general preventive health recommendations were provided to patient.     Daphane Shepherd, LPN   [id number]   Nurse Notes: Declines DEXA

## 2022-12-14 NOTE — Patient Instructions (Signed)
[name] , Thank you for taking time to come for your Medicare Wellness Visit. I appreciate your ongoing commitment to your health goals. Please review the following plan we discussed and let me know if I can assist you in the future.   These are the goals we discussed:  Goals       AFib, HTN (pt-stated)      Current Barriers:  Unable to independently afford treatment regimen  Pharmacist Clinical Goal(s):  patient will verbalize ability to afford treatment regimen through collaboration with PharmD and provider.    Interventions: 1:1 collaboration with Dettinger, Fransisca Kaufmann, MD regarding development and update of comprehensive plan of care as evidenced by provider attestation and co-signature Inter-disciplinary care team collaboration (see longitudinal plan of care) Comprehensive medication review performed; medication list updated in electronic medical record  Atrial Fibrillation: Uncontrolled/controlled; current rate/rhythm control; converted to sinus on metoprolol; anticoagulant treatment: eliquis 2.mg BID A fib with RVR (NEW ONSET) -Follows with cardiology -Her CHA2DS2-VASc score> 2, started on Eliquis 2.5 mg p.o. twice daily(given age and weight). Home blood pressure, heart rate readings: n/a, encouraged Assessed patient finances. Application for LIS/extra help submitted via [place] website; will f/u up   Patient Goals/Self-Care Activities patient will:  - take medications as prescribed as evidenced by patient report and record review collaborate with provider on medication access solutions       DIET - INCREASE WATER INTAKE      Try to drink 6-8 glasses of water daily  11/19/2020 AWV Goal: Improved Nutrition/Diet  Patient will verbalize understanding that diet plays an important role in overall health and that a poor diet is a risk factor for many chronic medical conditions.  Over the next year, patient will improve self management of their diet by incorporating better  variety, improved meal pattern, more consistent meal timing, increased physical activity, and improved protein intake. Patient will utilize available community resources to help with food acquisition if needed (ex: food pantries, [address], etc) Patient will work with nutrition specialist if a referral was made       Exercise 150 minutes per week (moderate activity)      Prevent falls        This is a list of the screening recommended for you and due dates:  Health Maintenance  Topic Date Due   Yearly kidney health urinalysis for diabetes  Never done   COVID-19 Vaccine (3 - 2023-24 season) 06/18/2022   DEXA scan (bone density measurement)  09/22/2022   Flu Shot  01/16/2023*   Mammogram  07/25/2023*   Yearly kidney function blood test for diabetes  11/30/2023   Medicare Annual Wellness Visit  12/15/2023   DTaP/Tdap/Td vaccine (4 - Td or Tdap) 03/26/2032   Pneumonia Vaccine  Completed   Zoster (Shingles) Vaccine  Completed   HPV Vaccine  Aged Out  *Topic was postponed. The date shown is not the original due date.    Advanced directives: Advance directive discussed with you today. I have provided a copy for you to complete at home and have notarized. Once this is complete please bring a copy in to our office so we can scan it into your chart.   Conditions/risks identified: Aim for 30 minutes of exercise or brisk walking, 6-8 glasses of water, and 5 servings of fruits and vegetables each day.   Next appointment: Follow up in one year for your annual wellness visit    Preventive Care 65 Years and Older, Female Preventive care refers  to lifestyle choices and visits with your health care provider that can promote health and wellness. What does preventive care include? A yearly physical exam. This is also called an annual well check. Dental exams once or twice a year. Routine eye exams. Ask your health care provider how often you should have your eyes checked. Personal lifestyle  choices, including: Daily care of your teeth and gums. Regular physical activity. Eating a healthy diet. Avoiding tobacco and drug use. Limiting alcohol use. Practicing safe sex. Taking low-dose aspirin every day. Taking vitamin and mineral supplements as recommended by your health care provider. What happens during an annual well check? The services and screenings done by your health care provider during your annual well check will depend on your age, overall health, lifestyle risk factors, and family history of disease. Counseling  Your health care provider may ask you questions about your: Alcohol use. Tobacco use. Drug use. Emotional well-being. Home and relationship well-being. Sexual activity. Eating habits. History of falls. Memory and ability to understand (cognition). Work and work Statistician. Reproductive health. Screening  You may have the following tests or measurements: Height, weight, and BMI. Blood pressure. Lipid and cholesterol levels. These may be checked every 5 years, or more frequently if you are over 56 years old. Skin check. Lung cancer screening. You may have this screening every year starting at age 36 if you have a 30-pack-year history of smoking and currently smoke or have quit within the past 15 years. Fecal occult blood test (FOBT) of the stool. You may have this test every year starting at age 70. Flexible sigmoidoscopy or colonoscopy. You may have a sigmoidoscopy every 5 years or a colonoscopy every 10 years starting at age 24. Hepatitis C blood test. Hepatitis B blood test. Sexually transmitted disease (STD) testing. Diabetes screening. This is done by checking your blood sugar (glucose) after you have not eaten for a while (fasting). You may have this done every 1-3 years. Bone density scan. This is done to screen for osteoporosis. You may have this done starting at age 8. Mammogram. This may be done every 1-2 years. Talk to your health care  provider about how often you should have regular mammograms. Talk with your health care provider about your test results, treatment options, and if necessary, the need for more tests. Vaccines  Your health care provider may recommend certain vaccines, such as: Influenza vaccine. This is recommended every year. Tetanus, diphtheria, and acellular pertussis (Tdap, Td) vaccine. You may need a Td booster every 10 years. Zoster vaccine. You may need this after age 61. Pneumococcal 13-valent conjugate (PCV13) vaccine. One dose is recommended after age 74. Pneumococcal polysaccharide (PPSV23) vaccine. One dose is recommended after age 40. Talk to your health care provider about which screenings and vaccines you need and how often you need them. This information is not intended to replace advice given to you by your health care provider. Make sure you discuss any questions you have with your health care provider. Document Released: 10/31/2015 Document Revised: 06/23/2016 Document Reviewed: 08/05/2015 Elsevier Interactive Patient Education  2017 [place] Prevention in the Home Falls can cause injuries. They can happen to people of all ages. There are many things you can do to make your home safe and to help prevent falls. What can I do on the outside of my home? Regularly fix the edges of walkways and driveways and fix any cracks. Remove anything that might make you trip as you walk through  a door, such as a raised step or threshold. Trim any bushes or trees on the path to your home. Use bright outdoor lighting. Clear any walking paths of anything that might make someone trip, such as rocks or tools. Regularly check to see if handrails are loose or broken. Make sure that both sides of any steps have handrails. Any raised decks and porches should have guardrails on the edges. Have any leaves, snow, or ice cleared regularly. Use sand or salt on walking paths during winter. Clean up any  spills in your garage right away. This includes oil or grease spills. What can I do in the bathroom? Use night lights. Install grab bars by the toilet and in the tub and shower. Do not use towel bars as grab bars. Use non-skid mats or decals in the tub or shower. If you need to sit down in the shower, use a plastic, non-slip stool. Keep the floor dry. Clean up any water that spills on the floor as soon as it happens. Remove soap buildup in the tub or shower regularly. Attach bath mats securely with double-sided non-slip rug tape. Do not have throw rugs and other things on the floor that can make you trip. What can I do in the bedroom? Use night lights. Make sure that you have a light by your bed that is easy to reach. Do not use any sheets or blankets that are too big for your bed. They should not hang down onto the floor. Have a firm chair that has side arms. You can use this for support while you get dressed. Do not have throw rugs and other things on the floor that can make you trip. What can I do in the kitchen? Clean up any spills right away. Avoid walking on wet floors. Keep items that you use a lot in easy-to-reach places. If you need to reach something above you, use a strong step stool that has a grab bar. Keep electrical cords out of the way. Do not use floor polish or wax that makes floors slippery. If you must use wax, use non-skid floor wax. Do not have throw rugs and other things on the floor that can make you trip. What can I do with my stairs? Do not leave any items on the stairs. Make sure that there are handrails on both sides of the stairs and use them. Fix handrails that are broken or loose. Make sure that handrails are as long as the stairways. Check any carpeting to make sure that it is firmly attached to the stairs. Fix any carpet that is loose or worn. Avoid having throw rugs at the top or bottom of the stairs. If you do have throw rugs, attach them to the floor  with carpet tape. Make sure that you have a light switch at the top of the stairs and the bottom of the stairs. If you do not have them, ask someone to add them for you. What else can I do to help prevent falls? Wear shoes that: Do not have high heels. Have rubber bottoms. Are comfortable and fit you well. Are closed at the toe. Do not wear sandals. If you use a stepladder: Make sure that it is fully opened. Do not climb a closed stepladder. Make sure that both sides of the stepladder are locked into place. Ask someone to hold it for you, if possible. Clearly mark and make sure that you can see: Any grab bars or handrails. First and last  steps. Where the edge of each step is. Use tools that help you move around (mobility aids) if they are needed. These include: Canes. Walkers. Scooters. Crutches. Turn on the lights when you go into a dark area. Replace any light bulbs as soon as they burn out. Set up your furniture so you have a clear path. Avoid moving your furniture around. If any of your floors are uneven, fix them. If there are any pets around you, be aware of where they are. Review your medicines with your doctor. Some medicines can make you feel dizzy. This can increase your chance of falling. Ask your doctor what other things that you can do to help prevent falls. This information is not intended to replace advice given to you by your health care provider. Make sure you discuss any questions you have with your health care provider. Document Released: 07/31/2009 Document Revised: 03/11/2016 Document Reviewed: 11/08/2014 Elsevier Interactive Patient Education  2017 [employer].

## 2022-12-22 ENCOUNTER — Ambulatory Visit: Payer: Medicare HMO | Admitting: Family Medicine

## 2023-01-28 ENCOUNTER — Other Ambulatory Visit: Payer: Self-pay | Admitting: Family Medicine

## 2023-02-24 ENCOUNTER — Other Ambulatory Visit: Payer: Self-pay | Admitting: Family Medicine

## 2023-02-24 DIAGNOSIS — K219 Gastro-esophageal reflux disease without esophagitis: Secondary | ICD-10-CM

## 2023-03-30 ENCOUNTER — Other Ambulatory Visit: Payer: Self-pay | Admitting: Family Medicine

## 2023-03-30 ENCOUNTER — Ambulatory Visit (INDEPENDENT_AMBULATORY_CARE_PROVIDER_SITE_OTHER): Payer: Medicare HMO | Admitting: Family Medicine

## 2023-03-30 ENCOUNTER — Encounter: Payer: Self-pay | Admitting: Family Medicine

## 2023-03-30 VITALS — BP 128/79 | HR 70 | Ht 60.0 in | Wt 103.0 lb

## 2023-03-30 DIAGNOSIS — F411 Generalized anxiety disorder: Secondary | ICD-10-CM

## 2023-03-30 DIAGNOSIS — R7303 Prediabetes: Secondary | ICD-10-CM | POA: Diagnosis not present

## 2023-03-30 DIAGNOSIS — I4891 Unspecified atrial fibrillation: Secondary | ICD-10-CM | POA: Diagnosis not present

## 2023-03-30 DIAGNOSIS — R630 Anorexia: Secondary | ICD-10-CM

## 2023-03-30 DIAGNOSIS — E034 Atrophy of thyroid (acquired): Secondary | ICD-10-CM | POA: Diagnosis not present

## 2023-03-30 DIAGNOSIS — E782 Mixed hyperlipidemia: Secondary | ICD-10-CM | POA: Diagnosis not present

## 2023-03-30 DIAGNOSIS — R634 Abnormal weight loss: Secondary | ICD-10-CM | POA: Diagnosis not present

## 2023-03-30 DIAGNOSIS — I10 Essential (primary) hypertension: Secondary | ICD-10-CM | POA: Diagnosis not present

## 2023-03-30 LAB — BAYER DCA HB A1C WAIVED: HB A1C (BAYER DCA - WAIVED): 6 % — ABNORMAL HIGH (ref 4.8–5.6)

## 2023-03-30 MED ORDER — OXAZEPAM 15 MG PO CAPS: 15.0000 mg | ORAL_CAPSULE | Freq: Three times a day (TID) | ORAL | 3 refills | Status: DC | PRN

## 2023-03-30 MED ORDER — LOPERAMIDE HCL 2 MG PO CAPS: 2.0000 mg | ORAL_CAPSULE | Freq: Four times a day (QID) | ORAL | 1 refills | Status: DC | PRN

## 2023-03-30 MED ORDER — MIRTAZAPINE 15 MG PO TBDP: 15.0000 mg | ORAL_TABLET | Freq: Every day | ORAL | 3 refills | Status: AC

## 2023-03-30 MED ORDER — APIXABAN 2.5 MG PO TABS: 2.5000 mg | ORAL_TABLET | Freq: Two times a day (BID) | ORAL | 3 refills | Status: DC

## 2023-03-30 MED ORDER — FUROSEMIDE 20 MG PO TABS: 20.0000 mg | ORAL_TABLET | Freq: Every day | ORAL | 3 refills | Status: DC

## 2023-03-30 MED ORDER — METOPROLOL TARTRATE 25 MG PO TABS: 25.0000 mg | ORAL_TABLET | Freq: Two times a day (BID) | ORAL | 3 refills | Status: DC
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
## 2023-03-30 NOTE — Addendum Note (Signed)
Addended by: Arville Care on: 03/30/2023 10:55 AM   Modules accepted: Orders
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
## 2023-04-06 ENCOUNTER — Ambulatory Visit (INDEPENDENT_AMBULATORY_CARE_PROVIDER_SITE_OTHER): Payer: Medicare HMO | Admitting: Cardiology

## 2023-04-06 ENCOUNTER — Encounter: Payer: Self-pay | Admitting: Cardiology

## 2023-04-06 VITALS — BP 110/70 | HR 64 | Ht 60.0 in | Wt 104.0 lb

## 2023-04-06 DIAGNOSIS — I050 Rheumatic mitral stenosis: Secondary | ICD-10-CM

## 2023-04-06 DIAGNOSIS — I351 Nonrheumatic aortic (valve) insufficiency: Secondary | ICD-10-CM

## 2023-04-06 DIAGNOSIS — I5032 Chronic diastolic (congestive) heart failure: Secondary | ICD-10-CM | POA: Diagnosis not present

## 2023-04-06 DIAGNOSIS — I482 Chronic atrial fibrillation, unspecified: Secondary | ICD-10-CM | POA: Diagnosis not present

## 2023-04-06 NOTE — Patient Instructions (Signed)
Medication Instructions:  The current medical regimen is effective;  continue present plan and medications.  *If you need a refill on your cardiac medications before your next appointment, please call your pharmacy*  Testing/Procedures: Your physician has requested that you have an echocardiogram. Echocardiography is a painless test that uses sound waves to create images of your heart. It provides your doctor with information about the size and shape of your heart and how well your heart's chambers and valves are working. This procedure takes approximately one hour. There are no restrictions for this procedure. Please do NOT wear cologne, perfume, aftershave, or lotions (deodorant is allowed). Please arrive 15 minutes prior to your appointment time. This will be due on December and you will be contacted to be scheduled at [facility].  Follow-Up: At [facility], you and your health needs are our priority.  As part of our continuing mission to provide you with exceptional heart care, we have created designated Provider Care Teams.  These Care Teams include your primary Cardiologist (physician) and Advanced Practice Providers (APPs -  Physician Assistants and Nurse Practitioners) who all work together to provide you with the care you need, when you need it.  We recommend signing up for the patient portal called "MyChart".  Sign up information is provided on this After Visit Summary.  MyChart is used to connect with patients for Virtual Visits (Telemedicine).  Patients are able to view lab/test results, encounter notes, upcoming appointments, etc.  Non-urgent messages can be sent to your provider as well.   To learn more about what you can do with MyChart, go to [email or web address].    Your next appointment:   1 year(s)  Provider:   Rollene Rotunda, MD

## 2023-05-05 ENCOUNTER — Other Ambulatory Visit: Payer: Self-pay | Admitting: Family Medicine

## 2023-05-05 DIAGNOSIS — K219 Gastro-esophageal reflux disease without esophagitis: Secondary | ICD-10-CM

## 2023-06-01 ENCOUNTER — Other Ambulatory Visit: Payer: Self-pay | Admitting: Family Medicine

## 2023-06-23 ENCOUNTER — Telehealth: Payer: Self-pay | Admitting: Cardiology

## 2023-06-23 NOTE — Telephone Encounter (Signed)
Checking percert on the following patient for testing scheduled at [facility].    ECHO   09/30/2023

## 2023-06-28 DIAGNOSIS — I700 Atherosclerosis of aorta: Secondary | ICD-10-CM | POA: Diagnosis not present

## 2023-06-28 DIAGNOSIS — E785 Hyperlipidemia, unspecified: Secondary | ICD-10-CM | POA: Diagnosis not present

## 2023-06-28 DIAGNOSIS — M810 Age-related osteoporosis without current pathological fracture: Secondary | ICD-10-CM | POA: Diagnosis not present

## 2023-06-28 DIAGNOSIS — D6869 Other thrombophilia: Secondary | ICD-10-CM | POA: Diagnosis not present

## 2023-06-28 DIAGNOSIS — I10 Essential (primary) hypertension: Secondary | ICD-10-CM | POA: Diagnosis not present

## 2023-06-28 DIAGNOSIS — E039 Hypothyroidism, unspecified: Secondary | ICD-10-CM | POA: Diagnosis not present

## 2023-06-28 DIAGNOSIS — I4891 Unspecified atrial fibrillation: Secondary | ICD-10-CM | POA: Diagnosis not present

## 2023-06-28 DIAGNOSIS — J439 Emphysema, unspecified: Secondary | ICD-10-CM | POA: Diagnosis not present

## 2023-06-28 DIAGNOSIS — R197 Diarrhea, unspecified: Secondary | ICD-10-CM | POA: Diagnosis not present

## 2023-06-28 DIAGNOSIS — I2510 Atherosclerotic heart disease of native coronary artery without angina pectoris: Secondary | ICD-10-CM | POA: Diagnosis not present

## 2023-06-28 DIAGNOSIS — K219 Gastro-esophageal reflux disease without esophagitis: Secondary | ICD-10-CM | POA: Diagnosis not present

## 2023-06-28 DIAGNOSIS — Z87891 Personal history of nicotine dependence: Secondary | ICD-10-CM | POA: Diagnosis not present

## 2023-06-30 ENCOUNTER — Other Ambulatory Visit: Payer: Self-pay | Admitting: Family Medicine

## 2023-06-30 DIAGNOSIS — F411 Generalized anxiety disorder: Secondary | ICD-10-CM

## 2023-07-14 ENCOUNTER — Encounter: Payer: Self-pay | Admitting: Nurse Practitioner

## 2023-07-14 ENCOUNTER — Ambulatory Visit: Payer: Medicare HMO | Admitting: Nurse Practitioner

## 2023-07-14 VITALS — BP 126/63 | HR 69 | Temp 98.0°F | Ht 60.0 in | Wt 103.0 lb

## 2023-07-14 DIAGNOSIS — J069 Acute upper respiratory infection, unspecified: Secondary | ICD-10-CM | POA: Diagnosis not present

## 2023-07-14 NOTE — Progress Notes (Signed)
Acute Office Visit  Subjective:     Patient ID: [name], female    DOB: July 09, 1941, 82 y.o.   MRN: [id number]  Chief Complaint  Patient presents with   Cough    Productive cough since Monday   [name] is a 82 yrs old female present with her husband for an acute visit for cough.  Cough Pertinent negatives include no chest pain, chills, fever, headaches, myalgias, rash, sore throat, shortness of breath or wheezing. There is no history of environmental allergies.   Cough: Patient complains of productive cough with sputum described as brown.  Symptoms began 5 days ago.  The cough is productive of clear sputum and is aggravated by  laying down  Associated symptoms include:sputum production. Patient does not have new pets. Patient does not have a history of asthma. Patient does not have a history of environmental allergens. Patient does not have recent travel. Patient does not have a history of smoking.  Appetite is fine, 2-3 boots daily She doesn't want to be tested for COVID/FLU  Active Ambulatory Problems    Diagnosis Date Noted   Constipation    Esophageal stricture    Osteoporosis with pathological fracture    GERD (gastroesophageal reflux disease)    Hyperlipemia 02/14/2013   Hypothyroid 02/14/2013   Vitamin D deficiency 02/14/2013   Generalized anxiety disorder 02/14/2013   Essential hypertension 12/18/2015   Lower extremity edema 12/18/2015   Palpitations 12/18/2015   PAC (premature atrial contraction) 12/30/2015   Atherosclerosis of aorta (HCC) 02/21/2018   Chronic kidney disease (CKD) stage G3b/A1, moderately decreased glomerular filtration rate (GFR) between 30-44 mL/min/1.73 square meter and albuminuria creatinine ratio less than 30 mg/g (HCC) 02/22/2018   Prediabetes 02/25/2020   Tibial plateau fracture, right, closed, initial encounter 07/17/2020   A-fib (HCC) 03/26/2022   Mitral stenosis 05/24/2022   Aortic regurgitation 05/24/2022   Chronic  diastolic heart failure (HCC) 05/24/2022   Acute on chronic diastolic CHF (congestive heart failure) (HCC) 06/05/2022   Anxiety    Atrial fibrillation with rapid ventricular response (HCC) 06/05/2022   Viral URI 07/14/2023   Resolved Ambulatory Problems    Diagnosis Date Noted   No Resolved Ambulatory Problems   Past Medical History:  Diagnosis Date   Anxiety states    Atrial fibrillation (HCC)    Breast cyst    Cancer (HCC)    Cataract    Fracture, rib    Hiatal hernia    Osteoporosis    Other and unspecified hyperlipidemia    Unspecified hypothyroidism    Vertebral fracture, osteoporotic (HCC)     Review of Systems  Constitutional:  Negative for chills, fever and malaise/fatigue.  HENT:  Negative for congestion and sore throat.   Eyes:  Negative for pain.  Respiratory:  Positive for cough. Negative for shortness of breath and wheezing.   Cardiovascular:  Positive for leg swelling. Negative for chest pain.  Gastrointestinal:  Negative for blood in stool, melena, nausea and vomiting.  Genitourinary:  Negative for frequency and hematuria.  Musculoskeletal:  Negative for falls and myalgias.  Skin:  Negative for itching and rash.  Neurological:  Negative for dizziness and headaches.  Endo/Heme/Allergies:  Negative for environmental allergies and polydipsia. Does not bruise/bleed easily.  Psychiatric/Behavioral:  The patient does not have insomnia.    Negative unless indicated in HPI    Objective:    BP 126/63   Pulse 69   Temp 98 F (36.7 C) (Temporal)   Ht  5' (1.524 m)   Wt 103 lb (46.7 kg)   SpO2 93%   BMI 20.12 kg/m  BP Readings from Last 3 Encounters:  07/14/23 126/63  04/06/23 110/70  03/30/23 128/79   Wt Readings from Last 3 Encounters:  07/14/23 103 lb (46.7 kg)  04/06/23 104 lb (47.2 kg)  03/30/23 103 lb (46.7 kg)     Active Ambulatory Problems    Diagnosis Date Noted   Constipation    Esophageal stricture    Osteoporosis with pathological  fracture    GERD (gastroesophageal reflux disease)    Hyperlipemia 02/14/2013   Hypothyroid 02/14/2013   Vitamin D deficiency 02/14/2013   Generalized anxiety disorder 02/14/2013   Essential hypertension 12/18/2015   Lower extremity edema 12/18/2015   Palpitations 12/18/2015   PAC (premature atrial contraction) 12/30/2015   Atherosclerosis of aorta (HCC) 02/21/2018   Chronic kidney disease (CKD) stage G3b/A1, moderately decreased glomerular filtration rate (GFR) between 30-44 mL/min/1.73 square meter and albuminuria creatinine ratio less than 30 mg/g (HCC) 02/22/2018   Prediabetes 02/25/2020   Tibial plateau fracture, right, closed, initial encounter 07/17/2020   A-fib (HCC) 03/26/2022   Mitral stenosis 05/24/2022   Aortic regurgitation 05/24/2022   Chronic diastolic heart failure (HCC) 05/24/2022   Acute on chronic diastolic CHF (congestive heart failure) (HCC) 06/05/2022   Anxiety    Atrial fibrillation with rapid ventricular response (HCC) 06/05/2022   Viral URI 07/14/2023   Resolved Ambulatory Problems    Diagnosis Date Noted   No Resolved Ambulatory Problems   Past Medical History:  Diagnosis Date   Anxiety states    Atrial fibrillation (HCC)    Breast cyst    Cancer (HCC)    Cataract    Fracture, rib    Hiatal hernia    Osteoporosis    Other and unspecified hyperlipidemia    Unspecified hypothyroidism    Vertebral fracture, osteoporotic (HCC)      Physical Exam Vitals and nursing note reviewed.  Constitutional:      General: She is not in acute distress.    Appearance: Normal appearance. She is normal weight.  HENT:     Head: Normocephalic and atraumatic.     Right Ear: Tympanic membrane, ear canal and external ear normal.     Left Ear: Tympanic membrane, ear canal and external ear normal.     Nose: No congestion or rhinorrhea.  Eyes:     General: No scleral icterus.    Extraocular Movements: Extraocular movements intact.     Conjunctiva/sclera:  Conjunctivae normal.     Pupils: Pupils are equal, round, and reactive to light.  Cardiovascular:     Rate and Rhythm: Normal rate and regular rhythm.  Pulmonary:     Effort: Pulmonary effort is normal.     Breath sounds: Normal breath sounds.  Abdominal:     General: Bowel sounds are normal.     Palpations: Abdomen is soft.  Musculoskeletal:        General: Normal range of motion.     Right lower leg: No edema.     Left lower leg: No edema.  Skin:    General: Skin is warm and dry.  Neurological:     Mental Status: She is alert and oriented to person, place, and time. Mental status is at baseline.  Psychiatric:        Mood and Affect: Mood normal.        Behavior: Behavior normal.        Thought Content:  Thought content normal.        Judgment: Judgment normal.    No results found for any visits on 07/14/23.      Assessment & Plan:  Viral URI   [name] was sen today for cough, no acute distress Continue taking OTC cough syrup - Tyle for fever Increase Hydration, rest If not feeling better next week, call the clinic for possible ATB  Encourage healthy lifestyle choices, including diet (rich in fruits, vegetables, and lean proteins, and low in salt and simple carbohydrates).    The above assessment and management plan was discussed with the patient. The patient verbalized understanding of and has agreed to the management plan. Patient is aware to call the clinic if they develop any new symptoms or if symptoms persist or worsen. Patient is aware when to return to the clinic for a follow-up visit. Patient educated on when it is appropriate to go to the emergency department.  Return if symptoms worsen or fail to improve.  Arrie Aran Santa Lighter, DNP [facility] [address] [place], [place] [address] [phone]

## 2023-07-18 ENCOUNTER — Other Ambulatory Visit: Payer: Self-pay | Admitting: Nurse Practitioner

## 2023-07-18 ENCOUNTER — Telehealth: Payer: Self-pay | Admitting: Family Medicine

## 2023-07-18 DIAGNOSIS — J069 Acute upper respiratory infection, unspecified: Secondary | ICD-10-CM | POA: Insufficient documentation

## 2023-07-18 MED ORDER — AMOXICILLIN 875 MG PO TABS
875.0000 mg | ORAL_TABLET | Freq: Two times a day (BID) | ORAL | 0 refills | Status: DC
Start: 2023-07-18 — End: 2023-07-28

## 2023-07-19 NOTE — Telephone Encounter (Signed)
Left message making pt and husband aware of ATB being sent in.

## 2023-07-28 ENCOUNTER — Encounter: Payer: Self-pay | Admitting: Family Medicine

## 2023-07-28 ENCOUNTER — Ambulatory Visit: Payer: Medicare HMO | Admitting: Family Medicine

## 2023-07-28 VITALS — BP 105/65 | HR 54 | Ht 60.0 in | Wt 101.0 lb

## 2023-07-28 DIAGNOSIS — I10 Essential (primary) hypertension: Secondary | ICD-10-CM | POA: Diagnosis not present

## 2023-07-28 DIAGNOSIS — E782 Mixed hyperlipidemia: Secondary | ICD-10-CM | POA: Diagnosis not present

## 2023-07-28 DIAGNOSIS — E034 Atrophy of thyroid (acquired): Secondary | ICD-10-CM | POA: Diagnosis not present

## 2023-07-28 DIAGNOSIS — I4891 Unspecified atrial fibrillation: Secondary | ICD-10-CM

## 2023-07-28 DIAGNOSIS — N1832 Chronic kidney disease, stage 3b: Secondary | ICD-10-CM | POA: Diagnosis not present

## 2023-07-28 DIAGNOSIS — Z23 Encounter for immunization: Secondary | ICD-10-CM | POA: Diagnosis not present

## 2023-07-28 DIAGNOSIS — R7303 Prediabetes: Secondary | ICD-10-CM

## 2023-07-28 DIAGNOSIS — F411 Generalized anxiety disorder: Secondary | ICD-10-CM

## 2023-07-28 LAB — BAYER DCA HB A1C WAIVED: HB A1C (BAYER DCA - WAIVED): 5.8 % — ABNORMAL HIGH (ref 4.8–5.6)

## 2023-07-28 MED ORDER — OXAZEPAM 15 MG PO CAPS
15.0000 mg | ORAL_CAPSULE | Freq: Three times a day (TID) | ORAL | 3 refills | Status: DC | PRN
Start: 2023-07-28 — End: 2023-11-25

## 2023-07-28 MED ORDER — ONDANSETRON 4 MG PO TBDP: 4.0000 mg | ORAL_TABLET | Freq: Three times a day (TID) | ORAL | 0 refills | Status: DC | PRN

## 2023-07-28 NOTE — Progress Notes (Signed)
BP 105/65   Pulse (!) 54   Ht 5' (1.524 m)   Wt 101 lb (45.8 kg)   SpO2 98%   BMI 19.73 kg/m    Subjective:   Patient ID: [name], female    DOB: 12/25/1940, 82 y.o.   MRN: [id number]  HPI: [name] is a 82 y.o. female presenting on 07/28/2023 for Medical Management of Chronic Issues, Hyperlipidemia, Hypertension, Prediabetes, and Nausea   HPI Prediabetes Patient comes in today for recheck of his diabetes. Patient has been currently taking no medicine, has been diet controlled. Patient is not currently on an ACE inhibitor/ARB. Patient has not seen an ophthalmologist this year. Patient denies any new issues with their feet. The symptom started onset as an adult hypertension and hypothyroidism and hyperlipidemia ARE RELATED TO DM   Hypothyroidism recheck Patient is coming in for thyroid recheck today as well. They deny any issues with hair changes or heat or cold problems or diarrhea or constipation. They deny any chest pain or palpitations. They are currently on levothyroxine 25 micrograms   Hypertension and A-fib and CHF Patient is currently on Eliquis and diltiazem and furosemide and metoprolol, and their blood pressure today is 105/65. Patient denies any lightheadedness or dizziness. Patient denies headaches, blurred vision, chest pains, shortness of breath, or weakness. Denies any side effects from medication and is content with current medication.   Hyperlipidemia Patient is coming in for recheck of his hyperlipidemia. The patient is currently taking atorvastatin. They deny any issues with myalgias or history of liver damage from it. They deny any focal numbness or weakness or chest pain.   Anxiety recheck Current rx-oxazepam 15 mg 3 times daily as needed # meds rx-90/month Effectiveness of current meds-works well Adverse reactions form meds-none Pill count performed-No Last drug screen -12/15/2022 ( high risk q37m, moderate risk q48m, low risk yearly ) Urine  drug screen today- No Was the NCCSR reviewed-yes  If yes were their any concerning findings? -None Controlled substance contract signed on: 12/07/2022  Relevant past medical, surgical, family and social history reviewed and updated as indicated. Interim medical history since our last visit reviewed. Allergies and medications reviewed and updated.  Review of Systems  Constitutional:  Negative for chills and fever.  Eyes:  Negative for visual disturbance.  Respiratory:  Negative for chest tightness and shortness of breath.   Cardiovascular:  Negative for chest pain and leg swelling.  Genitourinary:  Negative for difficulty urinating and dysuria.  Musculoskeletal:  Negative for back pain and gait problem.  Skin:  Negative for rash.  Neurological:  Negative for dizziness, light-headedness and headaches.  Psychiatric/Behavioral:  Positive for decreased concentration, dysphoric mood and sleep disturbance. Negative for agitation, behavioral problems, self-injury and suicidal ideas. The patient is nervous/anxious.   All other systems reviewed and are negative.   Per HPI unless specifically indicated above   Allergies as of 07/28/2023       Reactions   Bisphosphonates Other (See Comments)   Difficulty swallowing   Ciprofloxacin Nausea And Vomiting   Codeine Nausea And Vomiting   Sulfa Antibiotics Nausea And Vomiting        Medication List        Accurate as of July 28, 2023  1:41 PM. If you have any questions, ask your nurse or doctor.          acetaminophen 500 MG tablet Commonly known as: TYLENOL Take 500 mg by mouth every 8 (eight) hours as needed for  mild pain or moderate pain.   albuterol 108 (90 Base) MCG/ACT inhaler Commonly known as: VENTOLIN HFA USE 2 PUFFS EVERY 6 HOURS AS NEEDED   amoxicillin 875 MG tablet Commonly known as: AMOXIL Take 1 tablet (875 mg total) by mouth 2 (two) times daily.   apixaban 2.5 MG Tabs tablet Commonly known as: ELIQUIS Take 1  tablet (2.5 mg total) by mouth 2 (two) times daily.   atorvastatin 80 MG tablet Commonly known as: LIPITOR Take 80 mg by mouth daily.   Commode Bedside Misc Bedside commode due to tibial fracture   Wheelchair Misc Pt needs wheelchair due to extremity fractures   Misc. Devices Misc Use rolling walker to ambulate around the house as needed.   diltiazem 240 MG 24 hr capsule Commonly known as: CARDIZEM CD TAKE ONE CAPSULE BY MOUTH DAILY   furosemide 20 MG tablet Commonly known as: LASIX Take 1 tablet (20 mg total) by mouth daily.   levothyroxine 25 MCG tablet Commonly known as: SYNTHROID TAKE ONE (1) TABLET BY MOUTH EVERY DAY   lidocaine 4 % Commonly known as: HM Lidocaine Patch Place 1 patch onto the skin daily.   loperamide 2 MG capsule Commonly known as: IMODIUM Take 1 capsule (2 mg total) by mouth 4 (four) times daily as needed for diarrhea or loose stools.   metoprolol tartrate 25 MG tablet Commonly known as: LOPRESSOR Take 1 tablet (25 mg total) by mouth 2 (two) times daily. as directed   mirtazapine 15 MG disintegrating tablet Commonly known as: REMERON SOL-TAB Take 1 tablet (15 mg total) by mouth at bedtime.   mometasone 50 MCG/ACT nasal spray Commonly known as: Nasonex Place 2 sprays into the nose daily.   omeprazole 40 MG capsule Commonly known as: PRILOSEC TAKE ONE CAPSULE BY MOUTH DAILY   ondansetron 4 MG disintegrating tablet Commonly known as: ZOFRAN-ODT TAKE 1 TABLET BY MOUTH EVERY 8 HOURS AS NEEDED FOR NAUSEA & VOMITING   oxazepam 15 MG capsule Commonly known as: SERAX Take 1 capsule (15 mg total) by mouth 3 (three) times daily as needed for sleep. Cancel any other prescriptions on file   polyethylene glycol 17 g packet Commonly known as: MIRALAX / GLYCOLAX Take 17 g by mouth daily as needed for moderate constipation.   sucralfate 1 g tablet Commonly known as: CARAFATE Take 1 tablet (1 g total) by mouth 2 (two) times daily.    triamcinolone ointment 0.5 % Commonly known as: KENALOG APPLY TO THE AFFECTED AREA(S) TOPICALLY TWICE DAILY   Vitamin D3 50 MCG (2000 UT) Chew Chew 4,000 Units by mouth daily.         Objective:   BP 105/65   Pulse (!) 54   Ht 5' (1.524 m)   Wt 101 lb (45.8 kg)   SpO2 98%   BMI 19.73 kg/m   Wt Readings from Last 3 Encounters:  07/28/23 101 lb (45.8 kg)  07/14/23 103 lb (46.7 kg)  04/06/23 104 lb (47.2 kg)    Physical Exam Vitals and nursing note reviewed.  Constitutional:      General: She is not in acute distress.    Appearance: She is well-developed. She is not diaphoretic.  Eyes:     Conjunctiva/sclera: Conjunctivae normal.  Cardiovascular:     Rate and Rhythm: Normal rate and regular rhythm.     Heart sounds: Normal heart sounds. No murmur heard. Pulmonary:     Effort: Pulmonary effort is normal. No respiratory distress.     Breath  sounds: Normal breath sounds. No wheezing.  Musculoskeletal:        General: No tenderness. Normal range of motion.  Skin:    General: Skin is warm and dry.     Findings: No rash.  Neurological:     Mental Status: She is alert and oriented to person, place, and time.     Coordination: Coordination normal.  Psychiatric:        Behavior: Behavior normal.       Assessment & Plan:   Problem List Items Addressed This Visit       Cardiovascular and Mediastinum   Essential hypertension   Relevant Orders   CBC with Differential/Platelet   CMP14+EGFR   Lipid panel   TSH   Bayer DCA Hb A1c Waived     Endocrine   Hypothyroid   Relevant Orders   CBC with Differential/Platelet   CMP14+EGFR   Lipid panel   TSH   Bayer DCA Hb A1c Waived     Genitourinary   Chronic kidney disease (CKD) stage G3b/A1, moderately decreased glomerular filtration rate (GFR) between 30-44 mL/min/1.73 square meter and albuminuria creatinine ratio less than 30 mg/g (HCC)     Other   Hyperlipemia   Relevant Orders   CBC with  Differential/Platelet   CMP14+EGFR   Lipid panel   TSH   Bayer DCA Hb A1c Waived   Generalized anxiety disorder   Prediabetes - Primary   Relevant Orders   CBC with Differential/Platelet   CMP14+EGFR   Lipid panel   TSH   Bayer DCA Hb A1c Waived   Other Visit Diagnoses     Atrial fibrillation with RVR (HCC)       Relevant Orders   CBC with Differential/Platelet   CMP14+EGFR   Lipid panel   TSH   Bayer DCA Hb A1c Waived       A1c is 5.8. Blood pressure looks good, no changes  Patient has been having a little bit of reflux and recommended for her to double up on her omeprazole for a week and to take Zofran as needed Follow up plan: Return in about 4 months (around 11/28/2023), or if symptoms worsen or fail to improve, for Hypertension and thyroid and anxiety recheck.  Counseling provided for all of the vaccine components Orders Placed This Encounter  Procedures   CBC with Differential/Platelet   CMP14+EGFR   Lipid panel   TSH   Bayer DCA Hb A1c Waived    Arville Care, MD [facility] 07/28/2023, 1:41 PM

## 2023-07-29 ENCOUNTER — Other Ambulatory Visit: Payer: Self-pay | Admitting: Family Medicine

## 2023-07-29 DIAGNOSIS — K219 Gastro-esophageal reflux disease without esophagitis: Secondary | ICD-10-CM

## 2023-07-29 LAB — CBC WITH DIFFERENTIAL/PLATELET
Basophils Absolute: 0.1 10*3/uL (ref 0.0–0.2)
Basos: 1 %
EOS (ABSOLUTE): 0.2 10*3/uL (ref 0.0–0.4)
Eos: 3 %
Hematocrit: 39.8 % (ref 34.0–46.6)
Hemoglobin: 12.4 g/dL (ref 11.1–15.9)
Immature Grans (Abs): 0 10*3/uL (ref 0.0–0.1)
Immature Granulocytes: 0 %
Lymphocytes Absolute: 2.2 10*3/uL (ref 0.7–3.1)
Lymphs: 27 %
MCH: 28.6 pg (ref 26.6–33.0)
MCHC: 31.2 g/dL — ABNORMAL LOW (ref 31.5–35.7)
MCV: 92 fL (ref 79–97)
Monocytes Absolute: 0.6 10*3/uL (ref 0.1–0.9)
Monocytes: 8 %
Neutrophils Absolute: 5 10*3/uL (ref 1.4–7.0)
Neutrophils: 61 %
Platelets: 392 10*3/uL (ref 150–450)
RBC: 4.33 x10E6/uL (ref 3.77–5.28)
RDW: 12.8 % (ref 11.7–15.4)
WBC: 8.1 10*3/uL (ref 3.4–10.8)

## 2023-07-29 LAB — CMP14+EGFR
ALT: 13 [IU]/L (ref 0–32)
AST: 23 [IU]/L (ref 0–40)
Albumin: 4.1 g/dL (ref 3.7–4.7)
Alkaline Phosphatase: 123 [IU]/L — ABNORMAL HIGH (ref 44–121)
BUN/Creatinine Ratio: 16 (ref 12–28)
BUN: 22 mg/dL (ref 8–27)
Bilirubin Total: 0.4 mg/dL (ref 0.0–1.2)
CO2: 24 mmol/L (ref 20–29)
Calcium: 9.4 mg/dL (ref 8.7–10.3)
Chloride: 99 mmol/L (ref 96–106)
Creatinine, Ser: 1.4 mg/dL — ABNORMAL HIGH (ref 0.57–1.00)
Globulin, Total: 3.2 g/dL (ref 1.5–4.5)
Glucose: 108 mg/dL — ABNORMAL HIGH (ref 70–99)
Potassium: 4.5 mmol/L (ref 3.5–5.2)
Sodium: 139 mmol/L (ref 134–144)
Total Protein: 7.3 g/dL (ref 6.0–8.5)
eGFR: 37 mL/min/{1.73_m2} — ABNORMAL LOW (ref >59)

## 2023-07-29 LAB — LIPID PANEL
Chol/HDL Ratio: 3.1 {ratio} (ref 0.0–4.4)
Cholesterol, Total: 126 mg/dL (ref 100–199)
HDL: 41 mg/dL (ref >39)
LDL Chol Calc (NIH): 64 mg/dL (ref 0–99)
Triglycerides: 117 mg/dL (ref 0–149)
VLDL Cholesterol Cal: 21 mg/dL (ref 5–40)

## 2023-07-29 LAB — TSH: TSH: 0.501 u[IU]/mL (ref 0.450–4.500)

## 2023-08-04 ENCOUNTER — Other Ambulatory Visit: Payer: Self-pay | Admitting: Family Medicine

## 2023-08-04 ENCOUNTER — Telehealth: Payer: Self-pay | Admitting: Family Medicine

## 2023-08-04 MED ORDER — AMOXICILLIN 500 MG PO CAPS: 500.0000 mg | ORAL_CAPSULE | Freq: Two times a day (BID) | ORAL | 0 refills | Status: DC

## 2023-08-04 NOTE — Progress Notes (Signed)
Lmtcb.

## 2023-08-04 NOTE — Progress Notes (Signed)
Sent amoxicillin for referral persistent cough.

## 2023-08-09 ENCOUNTER — Telehealth: Payer: Self-pay | Admitting: Family Medicine

## 2023-08-09 NOTE — Telephone Encounter (Signed)
Noted  

## 2023-08-09 NOTE — Progress Notes (Signed)
Left message informing pt that ATB was sent in. Advised to call back if no better.

## 2023-09-16 ENCOUNTER — Other Ambulatory Visit: Payer: Self-pay | Admitting: Family Medicine

## 2023-09-29 ENCOUNTER — Telehealth: Payer: Self-pay | Admitting: Family Medicine

## 2023-09-29 NOTE — Telephone Encounter (Signed)
Copied from CRM [id number]. Topic: General - Other >> Sep 29, 2023 11:25 AM [name] wrote: Reason for CRM: Daljig member insure health [employer] called in stating they need the office visit/labs, diagnoses along with medications and any other diagnoses // [phone]

## 2023-09-29 NOTE — Telephone Encounter (Signed)
Need records release signed

## 2023-09-30 ENCOUNTER — Ambulatory Visit (HOSPITAL_COMMUNITY)
Admission: RE | Admit: 2023-09-30 | Discharge: 2023-09-30 | Disposition: A | Discharge status: Home / Self Care | Payer: PPO | Source: Ambulatory Visit | Attending: Cardiology | Admitting: Cardiology

## 2023-09-30 DIAGNOSIS — I5032 Chronic diastolic (congestive) heart failure: Secondary | ICD-10-CM | POA: Insufficient documentation

## 2023-09-30 DIAGNOSIS — I351 Nonrheumatic aortic (valve) insufficiency: Secondary | ICD-10-CM | POA: Diagnosis not present

## 2023-09-30 DIAGNOSIS — I050 Rheumatic mitral stenosis: Secondary | ICD-10-CM | POA: Diagnosis not present

## 2023-09-30 LAB — ECHOCARDIOGRAM COMPLETE
AR max vel: 2.2 cm2
AV Area VTI: 2.56 cm2
AV Area mean vel: 2.4 cm2
AV Mean grad: 4 mm[Hg]
AV Peak grad: 9.4 mm[Hg]
Ao pk vel: 1.53 m/s
Area-P 1/2: 2.08 cm2
Calc EF: 61.9 %
MV VTI: 1.83 cm2
P 1/2 time: 594 ms
S' Lateral: 2.5 cm
Single Plane A2C EF: 58.3 %
Single Plane A4C EF: 65.4 %

## 2023-10-03 ENCOUNTER — Telehealth: Payer: Self-pay | Admitting: Family Medicine

## 2023-10-03 NOTE — Telephone Encounter (Signed)
Copied from CRM [id number]. Topic: Clinical - Prescription Issue >> Oct 03, 2023  4:00 PM Dennison Nancy wrote: Reason for CRM: Jay Schlichter who patient's husband call regarding patient nerve pills,  oxazepam (SERAX) 15 MG capsule patient has new insurance [employer] need a prior authorization for the medication , no longer have the [employer]

## 2023-10-04 ENCOUNTER — Other Ambulatory Visit (HOSPITAL_COMMUNITY): Payer: Self-pay

## 2023-10-04 ENCOUNTER — Telehealth: Payer: Self-pay

## 2023-10-04 NOTE — Telephone Encounter (Signed)
PA request has been Submitted. New Encounter created for follow up. For additional info see Pharmacy Prior Auth telephone encounter from 10/04/23.

## 2023-10-04 NOTE — Telephone Encounter (Signed)
Pharmacy Patient Advocate Encounter  Received notification from [facility] ADVANTAGE/RX ADVANCE that Prior Authorization for Oxazepam 15MG  capsules has been APPROVED from 10/04/2023 to 10/17/2024. Ran test claim, Copay is $4.50. This test claim was processed through [facility]- copay amounts may vary at other pharmacies due to pharmacy/plan contracts, or as the patient moves through the different stages of their insurance plan.   PA #/Case ID/Reference #: [id number]

## 2023-10-04 NOTE — Telephone Encounter (Signed)
Pharmacy Patient Advocate Encounter   Received notification from Pt Calls Messages that prior authorization for Oxazepam 15MG  capsules is required/requested.   Insurance verification completed.   The patient is insured through [facility] ADVANTAGE/RX ADVANCE .   Per test claim: PA required; PA submitted to above mentioned insurance via CoverMyMeds Key/confirmation #/EOC [facility] Status is pending

## 2023-10-04 NOTE — Telephone Encounter (Signed)
Called insurance at [phone] to check status of PA. Additional information was needed. Answered clinical questions with representative. PA status pending.

## 2023-10-04 NOTE — Telephone Encounter (Signed)
Pharmacy Patient Advocate Encounter  Received notification from [facility] ADVANTAGE/RX ADVANCE that Prior Authorization for Oxazepam 15MG  capsules has been CANCELLED due to: Prior Authorization duplicate/in progress   PA #/Case ID/Reference #: [facility]

## 2023-10-10 ENCOUNTER — Telehealth: Payer: Self-pay

## 2023-10-10 NOTE — Telephone Encounter (Signed)
Called and spoke with patient verified name and DOB. Let patient know that I had received a fax request from Sherle Poe at [facility] requesting confirmation of diagnosis of Congestive Heart Failure.  Patient has requested enrollment in the Chronic Special Needs Plan for 2024.  I informed her that all the requested information had been faxed back to [employer]. (at [phone]).

## 2023-10-27 ENCOUNTER — Other Ambulatory Visit: Payer: Self-pay | Admitting: Family Medicine

## 2023-10-27 DIAGNOSIS — K219 Gastro-esophageal reflux disease without esophagitis: Secondary | ICD-10-CM

## 2023-11-03 ENCOUNTER — Other Ambulatory Visit: Payer: Self-pay | Admitting: Family Medicine

## 2023-11-03 DIAGNOSIS — F411 Generalized anxiety disorder: Secondary | ICD-10-CM

## 2023-11-23 ENCOUNTER — Telehealth: Payer: Self-pay | Admitting: Family Medicine

## 2023-11-23 NOTE — Telephone Encounter (Signed)
 Called but unable to lm on vm. If pt's husband calls back a PA was completed on December 2024 AND SHOULD BE GOOD THROUGH DECEMBER 2025.

## 2023-11-23 NOTE — Telephone Encounter (Unsigned)
 Copied from CRM [id number]. Topic: Clinical - Prescription Issue >> Nov 23, 2023 10:06 AM Felizardo Hotter wrote: Reason for CRM: Pt's husband called stated [employer] needs prior authorization for oxazepam  (SERAX ) 15 MG capsule. Please call pt's husband [phone]

## 2023-11-24 ENCOUNTER — Other Ambulatory Visit (HOSPITAL_COMMUNITY): Payer: Self-pay

## 2023-11-24 ENCOUNTER — Telehealth: Payer: Self-pay

## 2023-11-24 NOTE — Telephone Encounter (Signed)
 PA request has been Started. New Encounter created for follow up. For additional info see Pharmacy Prior Auth telephone encounter from 11/24/23.   A pending Prior Authorization request for the Patient and Medication exist. Called insurance at [phone] to check status. Additional information was requested and faxed to the office. Representative refaxing form, will complete and fax back once received.

## 2023-11-24 NOTE — Telephone Encounter (Signed)
 Pharmacy Patient Advocate Encounter   Received notification from Pt Calls Messages that prior authorization for Oxazepam  15MG  capsules is required/requested.   Insurance verification completed.   The patient is insured through [employer] .   Per test claim: PA required and submitted KEY/EOC/Request #: [id number] CANCELLED due to: A pending Prior Authorization request for the Patient and Medication exist.   Called insurance at [phone] to check status. Additional information was requested and faxed to the office. Representative refaxing form, will complete and fax back once received.

## 2023-11-24 NOTE — Telephone Encounter (Signed)
 Additional information has been requested from the patient's insurance in order to proceed with the prior authorization request. Requested information has been sent, or form has been filled out and faxed back to [phone]

## 2023-11-25 ENCOUNTER — Ambulatory Visit (INDEPENDENT_AMBULATORY_CARE_PROVIDER_SITE_OTHER): Payer: Medicare HMO | Admitting: Family Medicine

## 2023-11-25 ENCOUNTER — Encounter: Payer: Self-pay | Admitting: Family Medicine

## 2023-11-25 VITALS — BP 129/64 | HR 65 | Ht 60.0 in | Wt 104.0 lb

## 2023-11-25 DIAGNOSIS — N1832 Chronic kidney disease, stage 3b: Secondary | ICD-10-CM

## 2023-11-25 DIAGNOSIS — F411 Generalized anxiety disorder: Secondary | ICD-10-CM | POA: Diagnosis not present

## 2023-11-25 DIAGNOSIS — E034 Atrophy of thyroid (acquired): Secondary | ICD-10-CM | POA: Diagnosis not present

## 2023-11-25 DIAGNOSIS — Z79899 Other long term (current) drug therapy: Secondary | ICD-10-CM

## 2023-11-25 DIAGNOSIS — E782 Mixed hyperlipidemia: Secondary | ICD-10-CM

## 2023-11-25 DIAGNOSIS — R7303 Prediabetes: Secondary | ICD-10-CM

## 2023-11-25 DIAGNOSIS — I10 Essential (primary) hypertension: Secondary | ICD-10-CM | POA: Diagnosis not present

## 2023-11-25 LAB — BAYER DCA HB A1C WAIVED: HB A1C (BAYER DCA - WAIVED): 5.9 % — ABNORMAL HIGH (ref 4.8–5.6)

## 2023-11-25 MED ORDER — OXAZEPAM 15 MG PO CAPS: 15.0000 mg | ORAL_CAPSULE | Freq: Three times a day (TID) | ORAL | 3 refills | Status: DC | PRN

## 2023-11-25 MED ORDER — LEVOTHYROXINE SODIUM 25 MCG PO TABS: 25.0000 ug | ORAL_TABLET | Freq: Every day | ORAL | 3 refills | Status: DC

## 2023-11-25 MED ORDER — DILTIAZEM HCL ER COATED BEADS 240 MG PO CP24: 240.0000 mg | ORAL_CAPSULE | Freq: Every day | ORAL | 3 refills | Status: DC

## 2023-11-25 NOTE — Progress Notes (Signed)
 BP 129/64   Pulse 65   Ht 5' (1.524 m)   Wt 104 lb (47.2 kg)   SpO2 99%   BMI 20.31 kg/m    Subjective:   Patient ID: [name], female    DOB: 1941/03/11, 83 y.o.   MRN: [id number]  HPI: [name] is a 83 y.o. female presenting on 11/25/2023 for Medical Management of Chronic Issues, Diabetes, Hyperlipidemia, Hypertension, and Anxiety   HPI Hypothyroidism recheck Patient is coming in for thyroid  recheck today as well. They deny any issues with hair changes or heat or cold problems or diarrhea or constipation. They deny any chest pain or palpitations. They are currently on levothyroxine  25 micrograms   Hyperlipidemia Patient is coming in for recheck of his hyperlipidemia. The patient is currently taking atorvastatin . They deny any issues with myalgias or history of liver damage from it. They deny any focal numbness or weakness or chest pain.   Hypertension Patient is currently on diltiazem  and furosemide  and metoprolol , and their blood pressure today is 129/64. Patient denies any lightheadedness or dizziness. Patient denies headaches, blurred vision, chest pains, shortness of breath, or weakness. Denies any side effects from medication and is content with current medication.   Anxiety recheck Current rx-oxazepam  15 mg 3 times daily as needed # meds rx-90/month Effectiveness of current meds-works well Adverse reactions form meds-none Pill count performed-No Last drug screen -2/28/ 2024 ( high risk q33m, moderate risk q73m, low risk yearly ) Urine drug screen today- Yes Was the N[facility] reviewed-yes  If yes were their any concerning findings? -None Controlled substance contract signed on: Today  Relevant past medical, surgical, family and social history reviewed and updated as indicated. Interim medical history since our last visit reviewed. Allergies and medications reviewed and updated.  Review of Systems  Constitutional:  Negative for chills and fever.  HENT:   Negative for congestion, ear discharge and ear pain.   Eyes:  Negative for redness and visual disturbance.  Respiratory:  Negative for chest tightness and shortness of breath.   Cardiovascular:  Negative for chest pain and leg swelling.  Genitourinary:  Negative for difficulty urinating and dysuria.  Musculoskeletal:  Negative for back pain and gait problem.  Skin:  Negative for rash.  Neurological:  Negative for dizziness, light-headedness and headaches.  Psychiatric/Behavioral:  Positive for sleep disturbance. Negative for agitation, behavioral problems, self-injury and suicidal ideas. The patient is nervous/anxious.   All other systems reviewed and are negative.   Per HPI unless specifically indicated above   Allergies as of 11/25/2023       Reactions   Bisphosphonates Other (See Comments)   Difficulty swallowing   Ciprofloxacin Nausea And Vomiting   Codeine  Nausea And Vomiting   Sulfa Antibiotics Nausea And Vomiting        Medication List        Accurate as of November 25, 2023  1:41 PM. If you have any questions, ask your nurse or doctor.          STOP taking these medications    amoxicillin  500 MG capsule Commonly known as: AMOXIL  Stopped by: Fonda LABOR Preslynn Bier       TAKE these medications    acetaminophen  500 MG tablet Commonly known as: TYLENOL  Take 500 mg by mouth every 8 (eight) hours as needed for mild pain or moderate pain.   albuterol  108 (90 Base) MCG/ACT inhaler Commonly known as: VENTOLIN  HFA USE 2 PUFFS EVERY 6 HOURS AS NEEDED  apixaban  2.5 MG Tabs tablet Commonly known as: ELIQUIS  Take 1 tablet (2.5 mg total) by mouth 2 (two) times daily.   atorvastatin  80 MG tablet Commonly known as: LIPITOR TAKE ONE (1) TABLET BY MOUTH EVERY DAY   Commode Bedside Misc Bedside commode due to tibial fracture   Wheelchair Misc Pt needs wheelchair due to extremity fractures   Misc. Devices Misc Use rolling walker to ambulate around the house as  needed.   diltiazem  240 MG 24 hr capsule Commonly known as: CARDIZEM  CD Take 1 capsule (240 mg total) by mouth daily.   furosemide  20 MG tablet Commonly known as: LASIX  Take 1 tablet (20 mg total) by mouth daily.   levothyroxine  25 MCG tablet Commonly known as: SYNTHROID  Take 1 tablet (25 mcg total) by mouth daily. What changed: how much to take Changed by: Fonda LABOR Peyson Delao   lidocaine  4 % Commonly known as: HM Lidocaine  Patch Place 1 patch onto the skin daily.   loperamide  2 MG capsule Commonly known as: IMODIUM  Take 1 capsule (2 mg total) by mouth 4 (four) times daily as needed for diarrhea or loose stools.   metoprolol  tartrate 25 MG tablet Commonly known as: LOPRESSOR  Take 1 tablet (25 mg total) by mouth 2 (two) times daily. as directed   mirtazapine  15 MG disintegrating tablet Commonly known as: REMERON  SOL-TAB Take 1 tablet (15 mg total) by mouth at bedtime.   mometasone  50 MCG/ACT nasal spray Commonly known as: Nasonex  Place 2 sprays into the nose daily.   omeprazole  40 MG capsule Commonly known as: PRILOSEC TAKE ONE CAPSULE BY MOUTH DAILY   ondansetron  4 MG disintegrating tablet Commonly known as: ZOFRAN -ODT Take 1 tablet (4 mg total) by mouth every 8 (eight) hours as needed.   oxazepam  15 MG capsule Commonly known as: SERAX  Take 1 capsule (15 mg total) by mouth 3 (three) times daily as needed for sleep. Cancel any other prescriptions on file   polyethylene glycol 17 g packet Commonly known as: MIRALAX  / GLYCOLAX  Take 17 g by mouth daily as needed for moderate constipation.   sucralfate  1 g tablet Commonly known as: CARAFATE  Take 1 tablet (1 g total) by mouth 2 (two) times daily.   triamcinolone  ointment 0.5 % Commonly known as: KENALOG  APPLY TO THE AFFECTED AREA(S) TOPICALLY TWICE DAILY   Vitamin D3 50 MCG (2000 UT) Chew Chew 4,000 Units by mouth daily.         Objective:   BP 129/64   Pulse 65   Ht 5' (1.524 m)   Wt 104 lb (47.2 kg)    SpO2 99%   BMI 20.31 kg/m   Wt Readings from Last 3 Encounters:  11/25/23 104 lb (47.2 kg)  07/28/23 101 lb (45.8 kg)  07/14/23 103 lb (46.7 kg)    Physical Exam    Assessment & Plan:   Problem List Items Addressed This Visit       Cardiovascular and Mediastinum   Essential hypertension   Relevant Medications   diltiazem  (CARDIZEM  CD) 240 MG 24 hr capsule   Other Relevant Orders   Microalbumin / creatinine urine ratio     Endocrine   Hypothyroid   Relevant Medications   levothyroxine  (SYNTHROID ) 25 MCG tablet   Other Relevant Orders   TSH     Genitourinary   Chronic kidney disease (CKD) stage G3b/A1, moderately decreased glomerular filtration rate (GFR) between 30-44 mL/min/1.73 square meter and albuminuria creatinine ratio less than 30 mg/g (HCC)  Other   Hyperlipemia   Relevant Medications   diltiazem  (CARDIZEM  CD) 240 MG 24 hr capsule   Other Relevant Orders   CMP14+EGFR   Lipid panel   Generalized anxiety disorder - Primary   Relevant Medications   oxazepam  (SERAX ) 15 MG capsule   Other Relevant Orders   CBC with Differential/Platelet   Prediabetes   Relevant Orders   Bayer DCA Hb A1c Waived   Other Visit Diagnoses       Controlled substance agreement signed       Relevant Orders   ToxASSURE Select 13 (MW), Urine       Continue current medicine, no changes.  Seems to be doing well for the most part mostly concerned about her husband and his mental wellbeing.  Will check blood work on the way out today. Follow up plan: Return in about 4 months (around 03/24/2024), or if symptoms worsen or fail to improve, for Hypertension and anxiety recheck.  Counseling provided for all of the vaccine components Orders Placed This Encounter  Procedures   CBC with Differential/Platelet   CMP14+EGFR   Lipid panel   ToxASSURE Select 13 (MW), Urine   Microalbumin / creatinine urine ratio   Bayer DCA Hb A1c Waived   TSH    Fonda Levins, MD [facility] 11/25/2023, 1:41 PM

## 2023-11-26 LAB — MICROALBUMIN / CREATININE URINE RATIO
Creatinine, Urine: 292.2 mg/dL
Microalb/Creat Ratio: 14 mg/g{creat} (ref 0–29)
Microalbumin, Urine: 42.3 ug/mL

## 2023-11-26 LAB — CMP14+EGFR
ALT: 9 [IU]/L (ref 0–32)
AST: 24 [IU]/L (ref 0–40)
Albumin: 4.2 g/dL (ref 3.7–4.7)
Alkaline Phosphatase: 111 [IU]/L (ref 44–121)
BUN/Creatinine Ratio: 11 — ABNORMAL LOW (ref 12–28)
BUN: 16 mg/dL (ref 8–27)
Bilirubin Total: 0.4 mg/dL (ref 0.0–1.2)
CO2: 25 mmol/L (ref 20–29)
Calcium: 9.4 mg/dL (ref 8.7–10.3)
Chloride: 99 mmol/L (ref 96–106)
Creatinine, Ser: 1.47 mg/dL — ABNORMAL HIGH (ref 0.57–1.00)
Globulin, Total: 3.3 g/dL (ref 1.5–4.5)
Glucose: 98 mg/dL (ref 70–99)
Potassium: 3.4 mmol/L — ABNORMAL LOW (ref 3.5–5.2)
Sodium: 143 mmol/L (ref 134–144)
Total Protein: 7.5 g/dL (ref 6.0–8.5)
eGFR: 35 mL/min/{1.73_m2} — ABNORMAL LOW (ref >59)

## 2023-11-26 LAB — LIPID PANEL
Chol/HDL Ratio: 2.8 {ratio} (ref 0.0–4.4)
Cholesterol, Total: 133 mg/dL (ref 100–199)
HDL: 48 mg/dL (ref >39)
LDL Chol Calc (NIH): 65 mg/dL (ref 0–99)
Triglycerides: 111 mg/dL (ref 0–149)
VLDL Cholesterol Cal: 20 mg/dL (ref 5–40)

## 2023-11-26 LAB — CBC WITH DIFFERENTIAL/PLATELET
Basophils Absolute: 0.1 10*3/uL (ref 0.0–0.2)
Basos: 1 %
EOS (ABSOLUTE): 0.2 10*3/uL (ref 0.0–0.4)
Eos: 3 %
Hematocrit: 37.4 % (ref 34.0–46.6)
Hemoglobin: 12 g/dL (ref 11.1–15.9)
Immature Grans (Abs): 0.1 10*3/uL (ref 0.0–0.1)
Immature Granulocytes: 1 %
Lymphocytes Absolute: 2.4 10*3/uL (ref 0.7–3.1)
Lymphs: 30 %
MCH: 28.8 pg (ref 26.6–33.0)
MCHC: 32.1 g/dL (ref 31.5–35.7)
MCV: 90 fL (ref 79–97)
Monocytes Absolute: 0.7 10*3/uL (ref 0.1–0.9)
Monocytes: 9 %
Neutrophils Absolute: 4.5 10*3/uL (ref 1.4–7.0)
Neutrophils: 56 %
Platelets: 260 10*3/uL (ref 150–450)
RBC: 4.17 x10E6/uL (ref 3.77–5.28)
RDW: 13.8 % (ref 11.7–15.4)
WBC: 8 10*3/uL (ref 3.4–10.8)

## 2023-11-26 LAB — TSH: TSH: 0.977 u[IU]/mL (ref 0.450–4.500)

## 2023-11-28 NOTE — Telephone Encounter (Signed)
 Additional information has been requested from the patient's insurance in order to proceed with the prior authorization request. Requested information has been sent, or form has been filled out and faxed back to [phone]

## 2023-11-29 LAB — TOXASSURE SELECT 13 (MW), URINE

## 2023-12-02 ENCOUNTER — Other Ambulatory Visit (HOSPITAL_COMMUNITY): Payer: Self-pay

## 2023-12-06 ENCOUNTER — Other Ambulatory Visit: Payer: Self-pay | Admitting: Family Medicine

## 2023-12-06 DIAGNOSIS — K219 Gastro-esophageal reflux disease without esophagitis: Secondary | ICD-10-CM

## 2023-12-08 NOTE — Telephone Encounter (Signed)
Pharmacy Patient Advocate Encounter  Received notification from [facility] that Prior Authorization for Oxazepam 15mg  caps has been APPROVED from 10/19/23 to 12/28/23   PA #/Case ID/Reference #: [id number]

## 2023-12-19 DIAGNOSIS — K219 Gastro-esophageal reflux disease without esophagitis: Secondary | ICD-10-CM | POA: Diagnosis not present

## 2023-12-19 DIAGNOSIS — E785 Hyperlipidemia, unspecified: Secondary | ICD-10-CM | POA: Diagnosis not present

## 2023-12-19 DIAGNOSIS — I700 Atherosclerosis of aorta: Secondary | ICD-10-CM | POA: Diagnosis not present

## 2023-12-19 DIAGNOSIS — I739 Peripheral vascular disease, unspecified: Secondary | ICD-10-CM | POA: Diagnosis not present

## 2023-12-19 DIAGNOSIS — D6869 Other thrombophilia: Secondary | ICD-10-CM | POA: Diagnosis not present

## 2023-12-19 DIAGNOSIS — E039 Hypothyroidism, unspecified: Secondary | ICD-10-CM | POA: Diagnosis not present

## 2023-12-19 DIAGNOSIS — Z008 Encounter for other general examination: Secondary | ICD-10-CM | POA: Diagnosis not present

## 2023-12-19 DIAGNOSIS — I4891 Unspecified atrial fibrillation: Secondary | ICD-10-CM | POA: Diagnosis not present

## 2023-12-19 DIAGNOSIS — Z87891 Personal history of nicotine dependence: Secondary | ICD-10-CM | POA: Diagnosis not present

## 2023-12-19 DIAGNOSIS — I2510 Atherosclerotic heart disease of native coronary artery without angina pectoris: Secondary | ICD-10-CM | POA: Diagnosis not present

## 2023-12-19 DIAGNOSIS — Z8249 Family history of ischemic heart disease and other diseases of the circulatory system: Secondary | ICD-10-CM | POA: Diagnosis not present

## 2023-12-19 DIAGNOSIS — J439 Emphysema, unspecified: Secondary | ICD-10-CM | POA: Diagnosis not present

## 2023-12-19 DIAGNOSIS — N1832 Chronic kidney disease, stage 3b: Secondary | ICD-10-CM | POA: Diagnosis not present

## 2023-12-27 IMAGING — DX DG CHEST 2V
2 series · 2 of 2 positions shown · non-contrast
Comparison: August 07, 2018

CLINICAL DATA: Wheezing

EXAM:
CHEST - 2 VIEW

[chest pa]
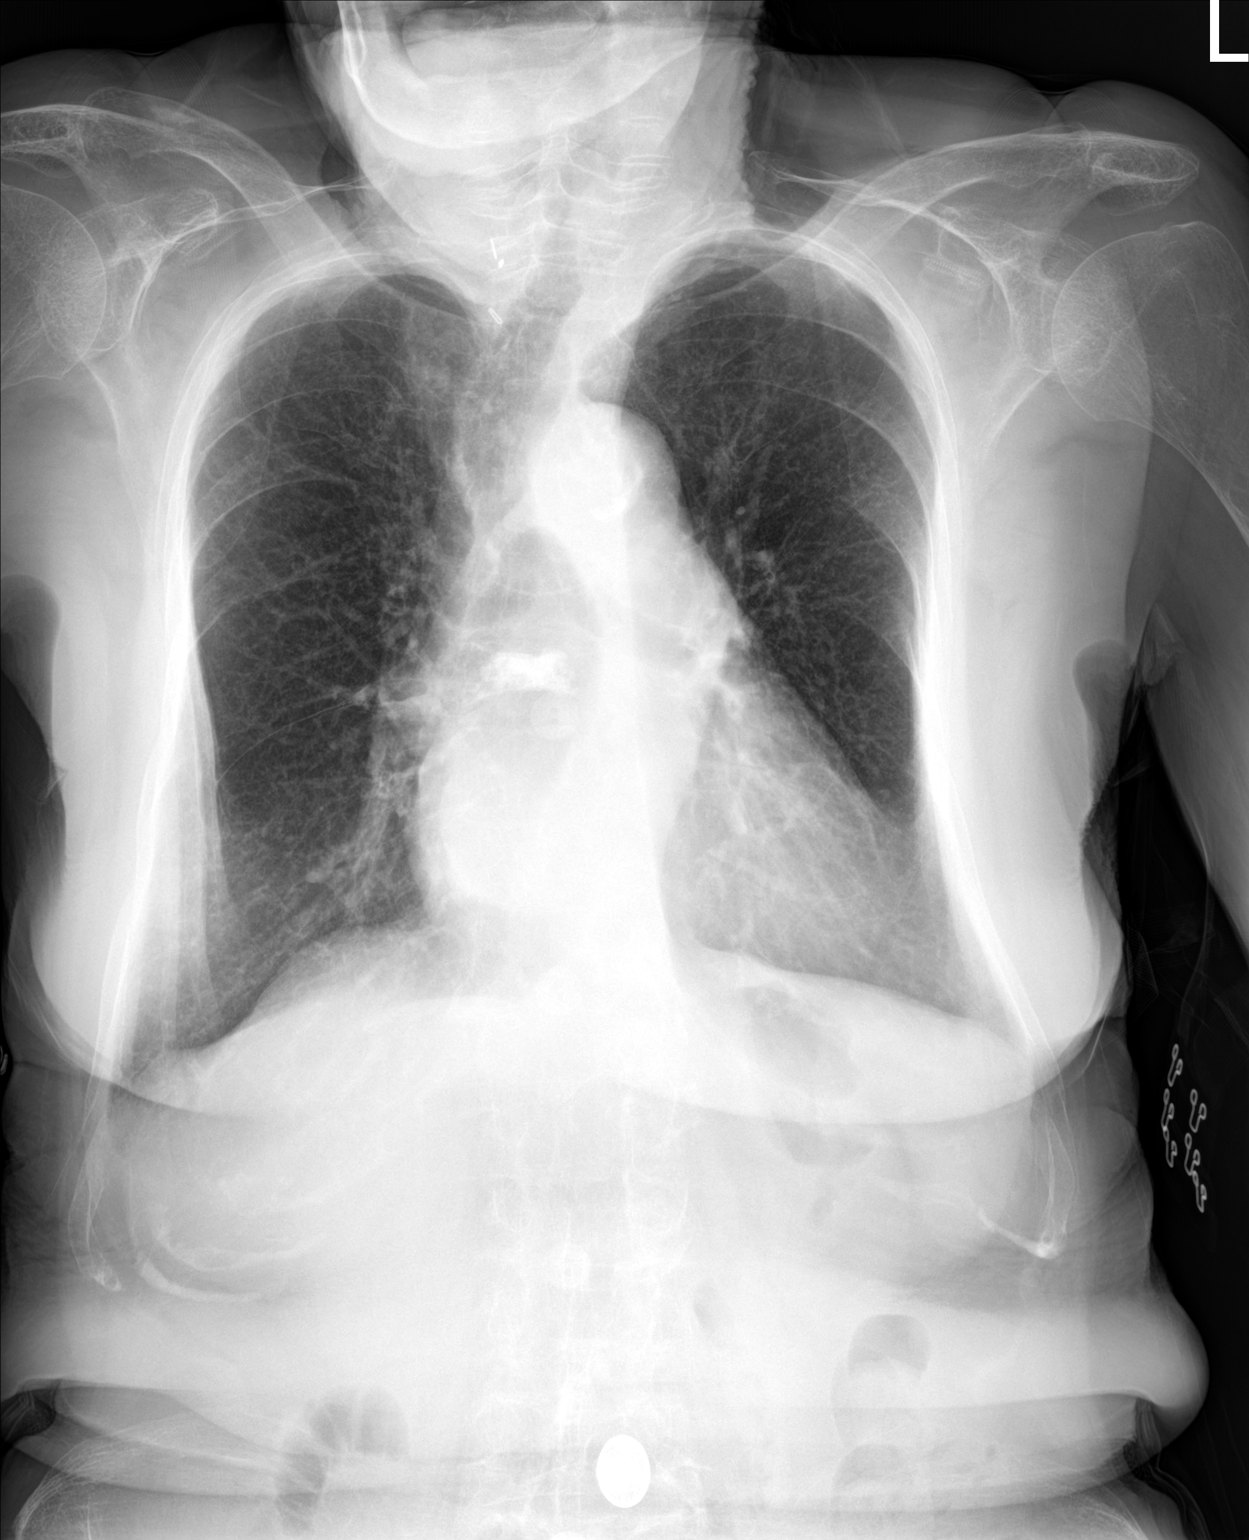

[chest lat]
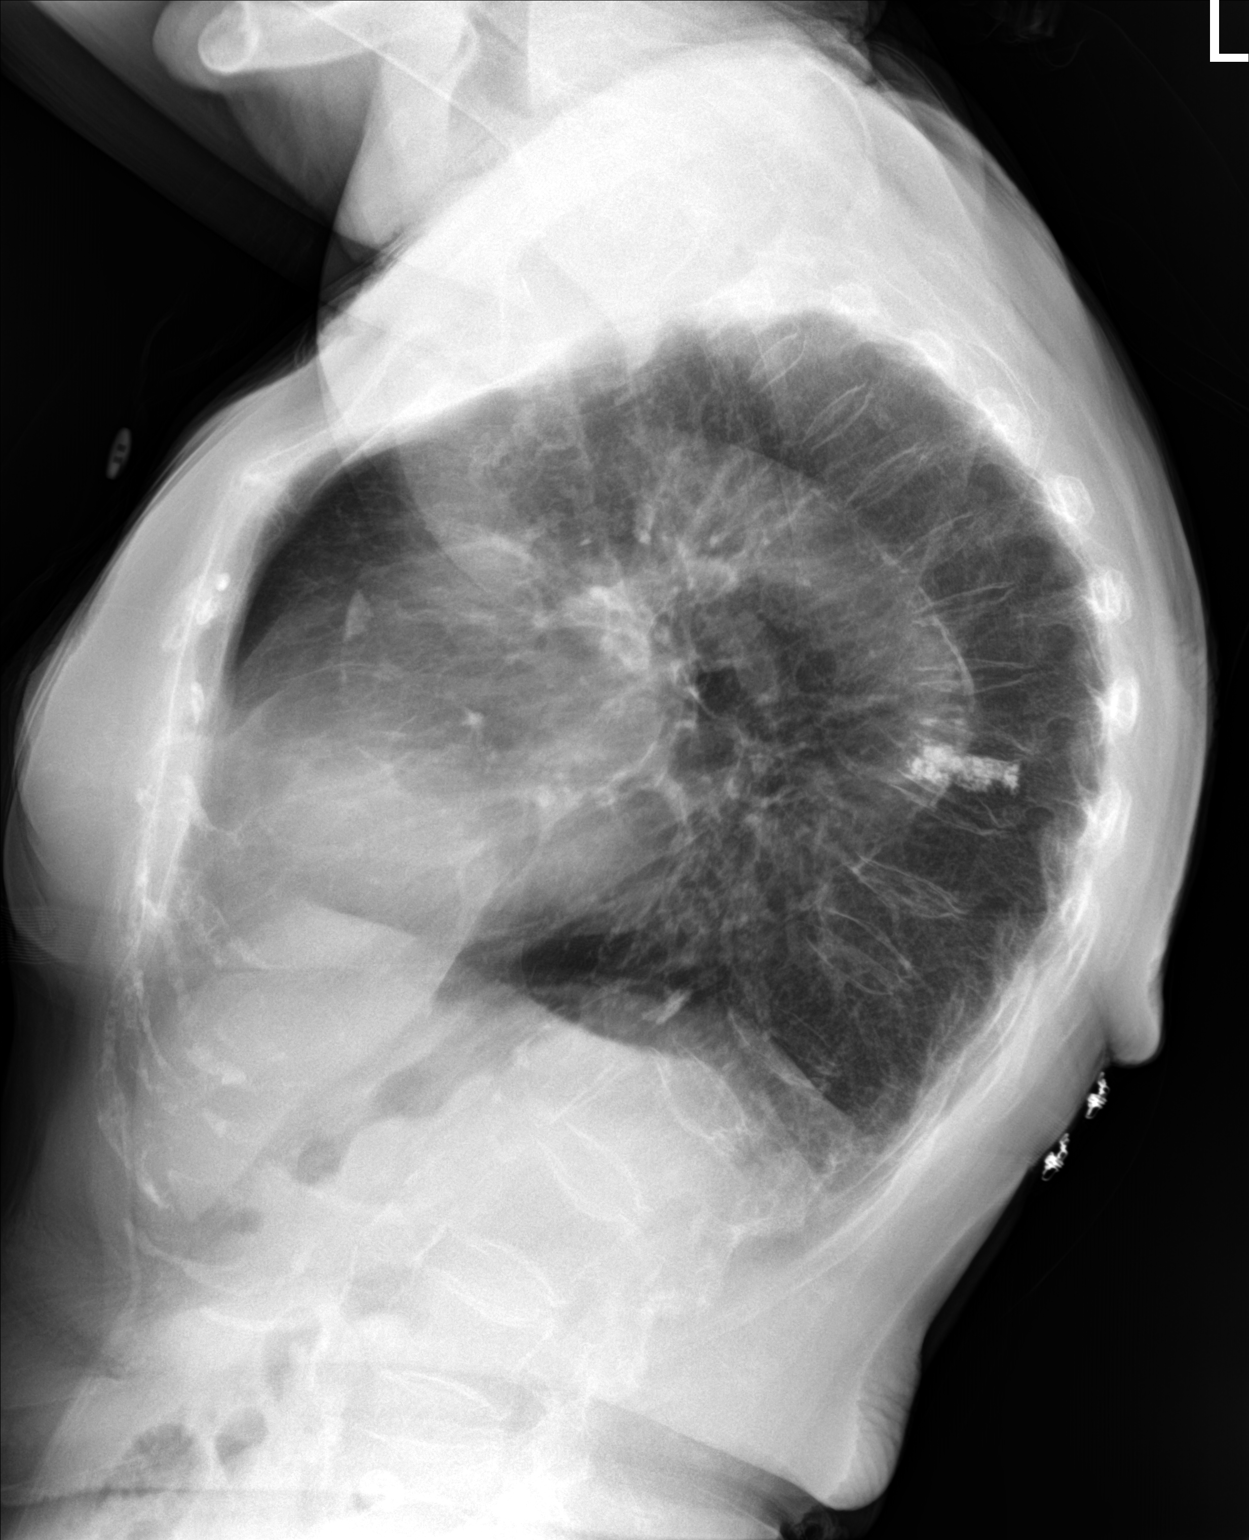

[2 of 2 positions shown; findings below may reference images not displayed]

FINDINGS: Stable cardiomegaly. The hila and mediastinum are unchanged. No
pneumothorax. No nodules or masses. The patient is status post
vertebroplasty of a midthoracic vertebral body. Anterior wedging of
other thoracic vertebral bodies is stable. There is a kyphotic
curvature to the thoracic spine. No other acute abnormalities.
IMPRESSION: No active cardiopulmonary disease.

## 2023-12-28 ENCOUNTER — Telehealth: Payer: Self-pay

## 2023-12-28 NOTE — Telephone Encounter (Signed)
 Copied from CRM [id number]. Topic: Clinical - Lab/Test Results >> Dec 28, 2023 12:13 PM Shon Hale wrote: Reason for CRM: Patient had a home healthy visit through insurance on 12/19/2023. Abnormal PAD (peripheral arterial disease) results, mailed results to office. Calling as a courtesy to advise provider.   If additional information is needed best call back: [phone]

## 2024-01-02 ENCOUNTER — Other Ambulatory Visit (HOSPITAL_COMMUNITY): Payer: Self-pay

## 2024-01-06 ENCOUNTER — Telehealth: Payer: Self-pay | Admitting: Family Medicine

## 2024-01-06 ENCOUNTER — Telehealth: Payer: Self-pay

## 2024-01-06 ENCOUNTER — Other Ambulatory Visit (HOSPITAL_COMMUNITY): Payer: Self-pay

## 2024-01-06 NOTE — Telephone Encounter (Signed)
 PA submitted. Awaiting response. LS

## 2024-01-06 NOTE — Telephone Encounter (Signed)
 Pharmacy Patient Advocate Encounter   Received notification from Pt Calls Messages that prior authorization for oxazepam 15  is required/requested.   Insurance verification completed.   The patient is insured through [employer] .   Per test claim: PA required; PA submitted to above mentioned insurance via CoverMyMeds Key/confirmation #/EOC [id number] Status is pending

## 2024-01-06 NOTE — Telephone Encounter (Signed)
 Pharmacy Patient Advocate Encounter  Received notification from [facility] that Prior Authorization for Oxazepam 15MG  capsules has been APPROVED from 01/06/24 to 02/05/24. Ran test claim, Copay is $0. This test claim was processed through [facility]- copay amounts may vary at other pharmacies due to pharmacy/plan contracts, or as the patient moves through the different stages of their insurance plan.   PA #/Case ID/Reference #: [id number]

## 2024-01-06 NOTE — Telephone Encounter (Signed)
 Copied from CRM [id number]. Topic: Clinical - Prescription Issue >> Jan 06, 2024  8:05 AM Geroge Baseman wrote: Reason for CRM: oxazepam (SERAX) 15 MG capsule patient states that the pharmacy needs a prior auth on this medication

## 2024-01-13 IMAGING — DX DG CHEST 2V
2 series · 2 of 2 positions shown · non-contrast
Comparison: Chest XR, 03/09/2022

CLINICAL DATA: Weakness.

EXAM:
CHEST - 2 VIEW

[chest pa]
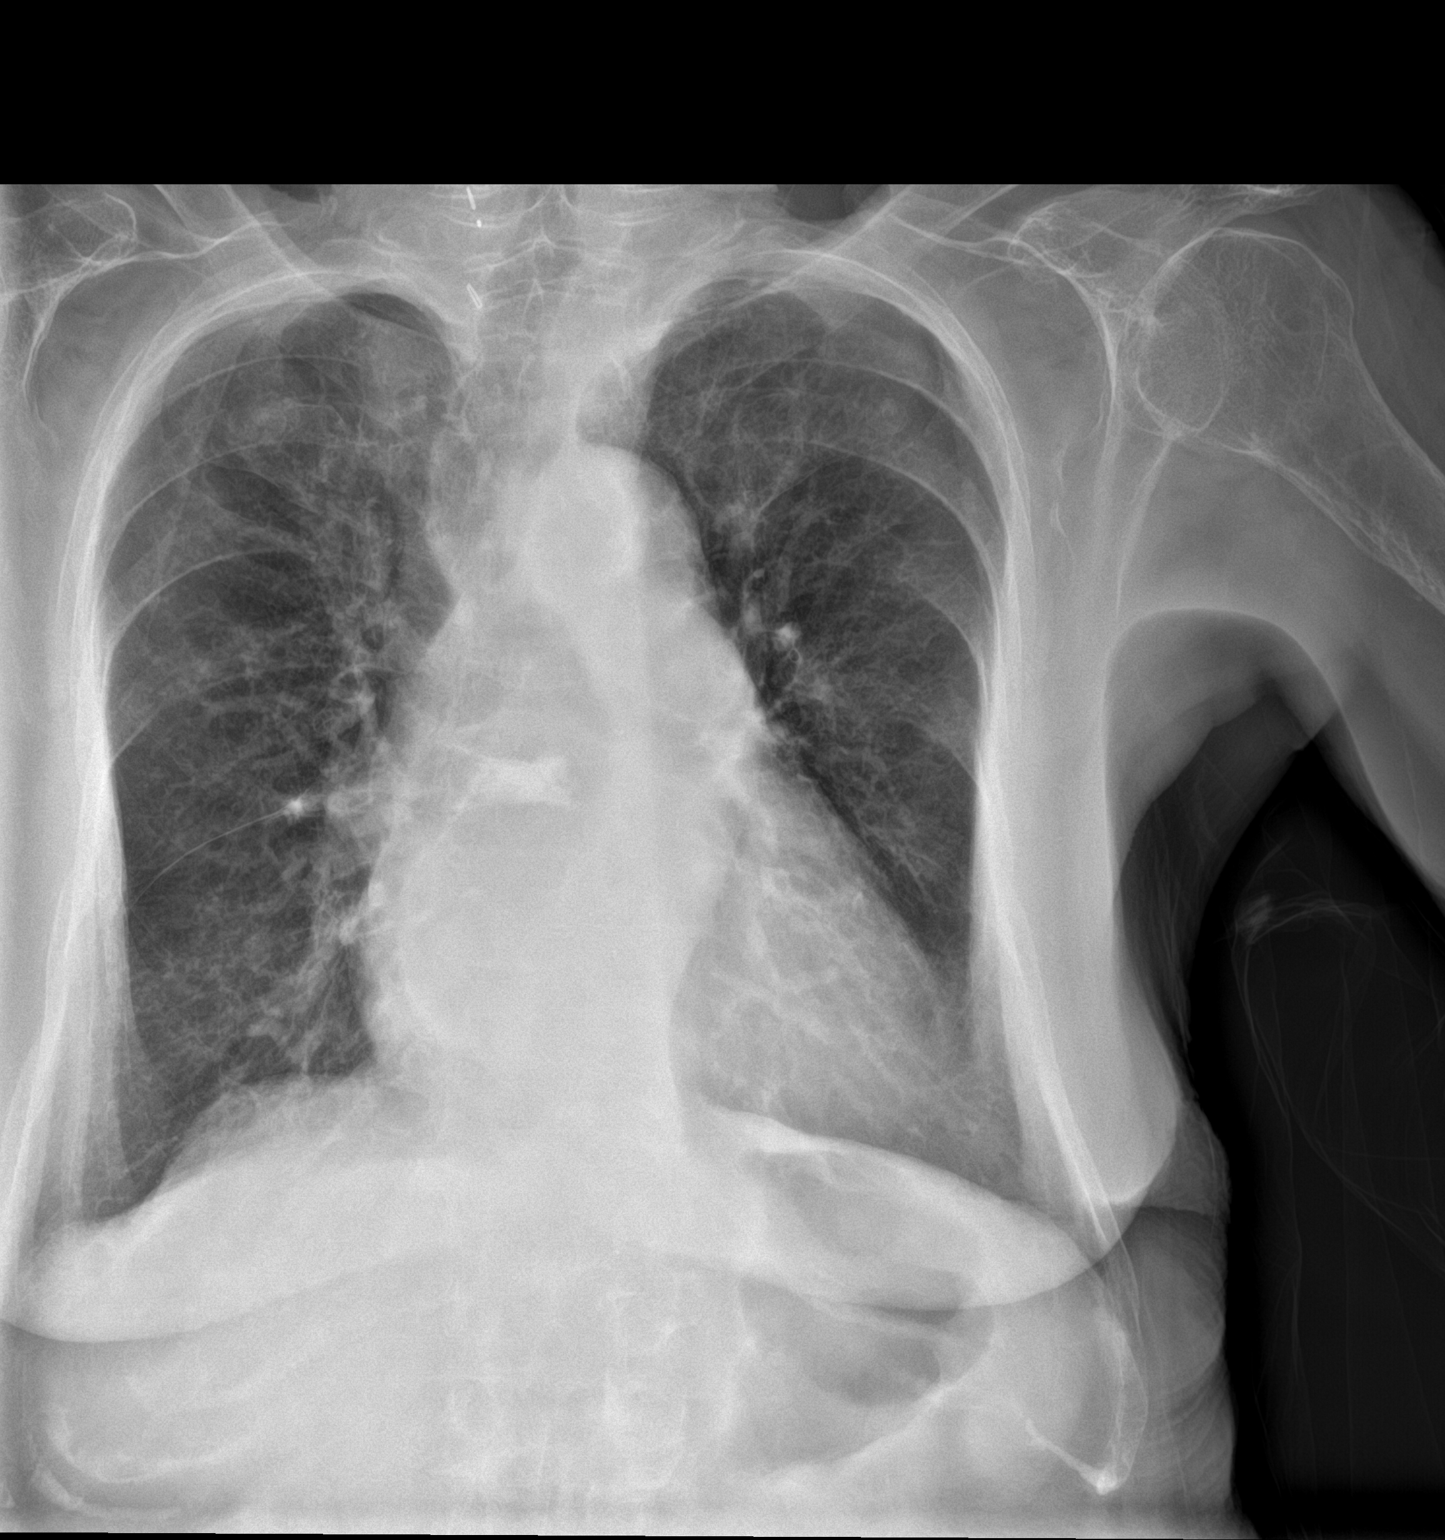

[chest lat]
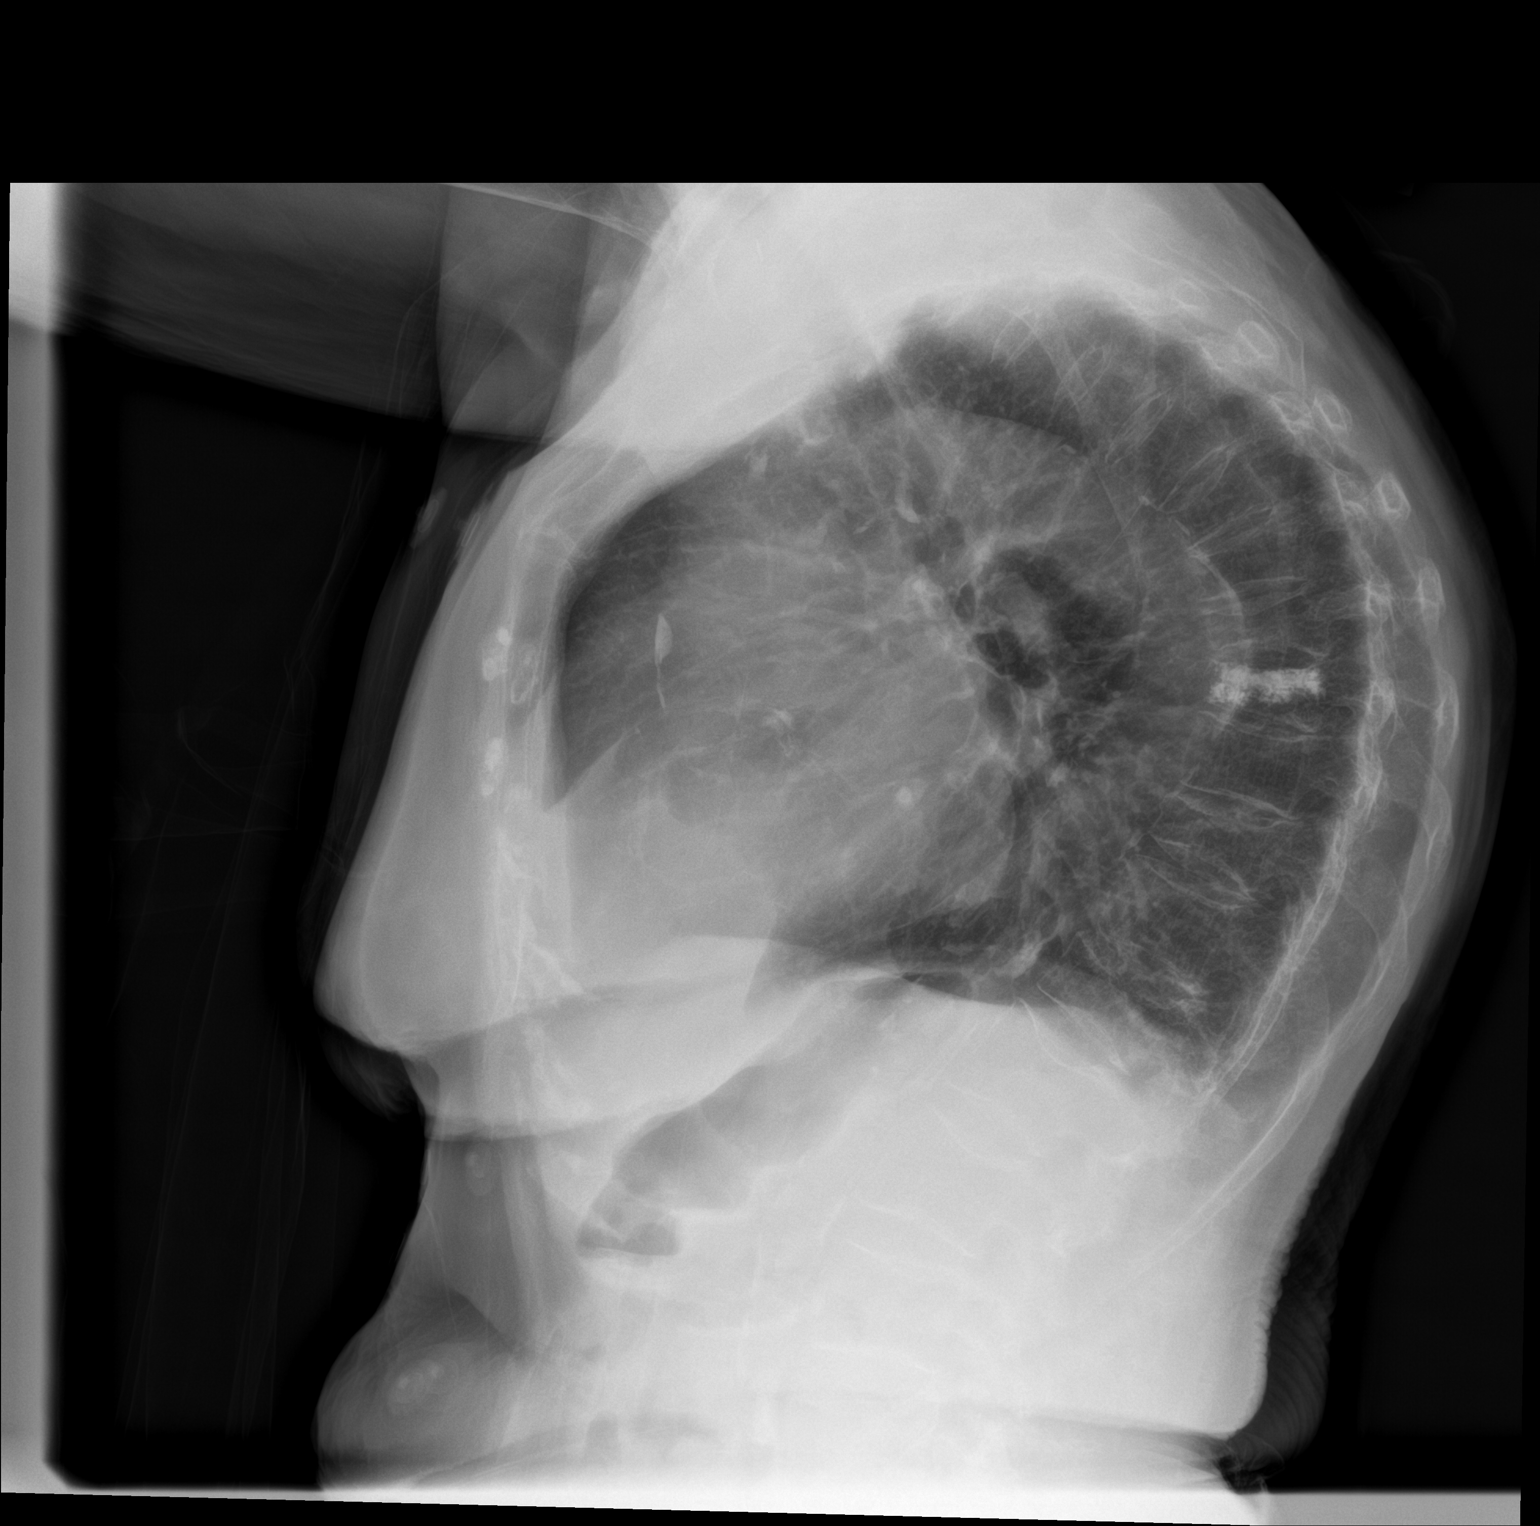

[2 of 2 positions shown; findings below may reference images not displayed]

FINDINGS: Cardiomediastinal silhouette is mildly enlarged. Tortuous thoracic
with vascular calcifications. Hypoinflation with increased AP
thoracic diameter perihilar interstitial thickening without discrete
consolidation. No pleural effusion or pneumothorax. Surgical clips
RIGHT. Degenerate changes spine with vertebral augmentation at
midthoracic vertebra. No acute displaced fracture.
IMPRESSION: 1. Obstructive pulmonary disease without acute superimposed
cardiopulmonary process.
2. Aortic Atherosclerosis (QZKHB-IP3.3) and Emphysema (QZKHB-I78.G).

## 2024-01-24 ENCOUNTER — Ambulatory Visit (INDEPENDENT_AMBULATORY_CARE_PROVIDER_SITE_OTHER): Payer: Medicare HMO

## 2024-01-24 VITALS — Ht 60.0 in | Wt 104.0 lb

## 2024-01-24 DIAGNOSIS — Z0000 Encounter for general adult medical examination without abnormal findings: Secondary | ICD-10-CM | POA: Diagnosis not present

## 2024-01-24 NOTE — Patient Instructions (Signed)
 Ms. Tolsma , Thank you for taking time to come for your Medicare Wellness Visit. I appreciate your ongoing commitment to your health goals. Please review the following plan we discussed and let me know if I can assist you in the future.   Referrals/Orders/Follow-Ups/Clinician Recommendations: Aim for 30 minutes of exercise or brisk walking, 6-8 glasses of water, and 5 servings of fruits and vegetables each day.  This is a list of the screening recommended for you and due dates:  Health Maintenance  Topic Date Due   COVID-19 Vaccine (3 - Moderna risk series) 02/27/2020   DEXA scan (bone density measurement)  03/29/2024*   Mammogram  07/27/2024*   Flu Shot  05/18/2024   Yearly kidney function blood test for diabetes  11/24/2024   Yearly kidney health urinalysis for diabetes  11/24/2024   Medicare Annual Wellness Visit  01/23/2025   DTaP/Tdap/Td vaccine (4 - Td or Tdap) 03/26/2032   Pneumonia Vaccine  Completed   Zoster (Shingles) Vaccine  Completed   HPV Vaccine  Aged Out  *Topic was postponed. The date shown is not the original due date.    Advanced directives: (ACP Link)Information on Advanced Care Planning can be found at [facility] Advance Health Care Directives Advance Health Care Directives. [email or web address]   Next Medicare Annual Wellness Visit scheduled for next year: Yes

## 2024-01-24 NOTE — Progress Notes (Signed)
 Subjective:   [name] is a 83 y.o. who presents for a Medicare Wellness preventive visit.  Visit Complete: Virtual I connected with  Dwyane Dee on 01/24/24 by a audio enabled telemedicine application and verified that I am speaking with the correct person using two identifiers.  Patient Location: Home  Provider Location: Home Office  I discussed the limitations of evaluation and management by telemedicine. The patient expressed understanding and agreed to proceed.  Vital Signs: Because this visit was a virtual/telehealth visit, some criteria may be missing or patient reported. Any vitals not documented were not able to be obtained and vitals that have been documented are patient reported.  VideoDeclined- This patient declined Librarian, academic. Therefore the visit was completed with audio only.  Persons Participating in Visit: Patient.  AWV Questionnaire: No: Patient Medicare AWV questionnaire was not completed prior to this visit.  Cardiac Risk Factors include: advanced age (>77men, >88 women);hypertension;dyslipidemia;sedentary lifestyle     Objective:    Today's Vitals   01/24/24 1250  Weight: 104 lb (47.2 kg)  Height: 5' (1.524 m)   Body mass index is 20.31 kg/m.     01/24/2024   12:56 PM 12/14/2022    2:52 PM 10/18/2022   10:28 PM 06/03/2022    7:40 PM 03/26/2022    1:24 PM 12/11/2021    2:06 PM 05/13/2021    1:57 PM  Advanced Directives  Does Patient Have a Medical Advance Directive? No No No No No No No  Would patient like information on creating a medical advance directive? Yes (MAU/Ambulatory/Procedural Areas - Information given) No - Patient declined No - Patient declined No - Patient declined No - Patient declined No - Patient declined     Current Medications (verified) Outpatient Encounter Medications as of 01/24/2024  Medication Sig   acetaminophen (TYLENOL) 500 MG tablet Take 500 mg by mouth every 8 (eight) hours as  needed for mild pain or moderate pain.   albuterol (VENTOLIN HFA) 108 (90 Base) MCG/ACT inhaler USE 2 PUFFS EVERY 6 HOURS AS NEEDED   apixaban (ELIQUIS) 2.5 MG TABS tablet Take 1 tablet (2.5 mg total) by mouth 2 (two) times daily.   atorvastatin (LIPITOR) 80 MG tablet TAKE ONE (1) TABLET BY MOUTH EVERY DAY   Cholecalciferol (VITAMIN D3) 2000 units CHEW Chew 4,000 Units by mouth daily.   diltiazem (CARDIZEM CD) 240 MG 24 hr capsule Take 1 capsule (240 mg total) by mouth daily.   furosemide (LASIX) 20 MG tablet Take 1 tablet (20 mg total) by mouth daily.   levothyroxine (SYNTHROID) 25 MCG tablet Take 1 tablet (25 mcg total) by mouth daily.   lidocaine (HM LIDOCAINE PATCH) 4 % Place 1 patch onto the skin daily.   loperamide (IMODIUM) 2 MG capsule Take 1 capsule (2 mg total) by mouth 4 (four) times daily as needed for diarrhea or loose stools.   metoprolol tartrate (LOPRESSOR) 25 MG tablet Take 1 tablet (25 mg total) by mouth 2 (two) times daily. as directed   mirtazapine (REMERON SOL-TAB) 15 MG disintegrating tablet Take 1 tablet (15 mg total) by mouth at bedtime.   Misc. Devices (COMMODE BEDSIDE) MISC Bedside commode due to tibial fracture   Misc. Devices [facility]) MISC Pt needs wheelchair due to extremity fractures   Misc. Devices MISC Use rolling walker to ambulate around the house as needed.   mometasone (NASONEX) 50 MCG/ACT nasal spray Place 2 sprays into the nose daily.   omeprazole (PRILOSEC) 40  MG capsule TAKE ONE CAPSULE BY MOUTH DAILY   ondansetron (ZOFRAN-ODT) 4 MG disintegrating tablet Take 1 tablet (4 mg total) by mouth every 8 (eight) hours as needed.   oxazepam (SERAX) 15 MG capsule Take 1 capsule (15 mg total) by mouth 3 (three) times daily as needed for sleep. Cancel any other prescriptions on file   polyethylene glycol (MIRALAX / GLYCOLAX) packet Take 17 g by mouth daily as needed for moderate constipation.   sucralfate (CARAFATE) 1 g tablet Take 1 tablet (1 g total) by mouth 2  (two) times daily.   triamcinolone ointment (KENALOG) 0.5 % APPLY TO THE AFFECTED AREA(S) TOPICALLY TWICE DAILY   No facility-administered encounter medications on file as of 01/24/2024.    Allergies (verified) Bisphosphonates, Ciprofloxacin, Codeine, and Sulfa antibiotics   History: Past Medical History:  Diagnosis Date   A-fib [facility])    Anxiety states    Aortic regurgitation 05/24/2022   Atrial fibrillation (HCC)    Breast cyst    Cancer (HCC)    skin   Cataract    Chronic diastolic heart failure (HCC) 05/24/2022   Constipation    Esophageal stricture    Essential hypertension 12/18/2015   Fracture, rib    GERD (gastroesophageal reflux disease)    Hiatal hernia    Lower extremity edema 12/18/2015   Mitral stenosis 05/24/2022   Osteoporosis    Other and unspecified hyperlipidemia    PAC (premature atrial contraction) 12/30/2015   Palpitations 12/18/2015   Unspecified hypothyroidism    Vertebral fracture, osteoporotic (HCC)    Past Surgical History:  Procedure Laterality Date   ABDOMINAL HYSTERECTOMY     APPENDECTOMY     BREAST BIOPSY     bil cysts   CATARACT EXTRACTION     ESOPHAGOGASTRODUODENOSCOPY N/A 01/19/2018   Procedure: ESOPHAGOGASTRODUODENOSCOPY (EGD);  Surgeon: Vida Rigger, MD;  Location: [facility];  Service: Endoscopy;  Laterality: N/A;  with flouro   EYE SURGERY     HAND SURGERY Left    IR GENERIC HISTORICAL  12/10/2016   IR RADIOLOGIST EVAL & MGMT 12/10/2016 [facility]   IR GENERIC HISTORICAL  12/23/2016   IR KYPHO THORACIC WITH BONE BIOPSY 12/23/2016 Julieanne Cotton, MD [facility]   PARTIAL HYSTERECTOMY     THYROID LOBECTOMY     right   VERTEBROPLASTY     Family History  Problem Relation Age of Onset   Goiter Father    Alcohol abuse Father    Heart disease Sister    Cancer Sister        breast and skin   Goiter Brother    Dementia Brother    Hyperlipidemia Brother    Hernia Brother    Cancer Brother        throat   Cancer Brother         neck   Heart disease Brother    Early death Brother        auto accident   Social History   Socioeconomic History   Marital status: Married    Spouse name: Social worker   Number of children: 0   Years of education: 8   Highest education level: 8th grade  Occupational History   Occupation: retired    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   Social Drivers of Corporate investment banker Strain: Low Risk  (01/24/2024)   Overall Financial Resource Strain (CARDIA)    Difficulty of Paying Living Expenses: Not hard at all  Food Insecurity: No Food Insecurity (01/24/2024)   Hunger Vital Sign    Worried About Running Out of Food in the Last Year: Never true    Ran Out of Food in the Last Year: Never true  Transportation Needs: No Transportation Needs (01/24/2024)   PRAPARE - Administrator, Civil Service (Medical): No    Lack of Transportation (Non-Medical): No  Physical Activity: Sufficiently Active (01/24/2024)   Exercise Vital Sign    Days of Exercise per Week: 5 days    Minutes of Exercise per Session: 30 min  Stress: No Stress Concern Present (01/24/2024)   [employer] of Occupational Health - Occupational Stress Questionnaire    Feeling of Stress : Not at all  Social Connections: Moderately Integrated (01/24/2024)   Social Connection and Isolation Panel [NHANES]    Frequency of Communication with Friends and Family: More than three times a week    Frequency of Social  Gatherings with Friends and Family: Three times a week    Attends Religious Services: Never    Active Member of Clubs or Organizations: No    Attends Engineer, structural: More than 4 times per year    Marital Status: Married    Tobacco Counseling Counseling given: Not Answered Tobacco comments: Non-smoker  quit 12-13 years    Clinical Intake:  Pre-visit preparation completed: Yes  Pain : No/denies pain     Diabetes: No  Lab Results  Component Value Date   HGBA1C 5.9 (H) 11/25/2023   HGBA1C 5.8 (H) 07/28/2023   HGBA1C 6.0 (H) 03/30/2023     How often do you need to have someone help you when you read instructions, pamphlets, or other written materials from your doctor or pharmacy?: 1 - Never  Interpreter Needed?: No  Information entered by :: Kandis Fantasia LPN   Activities of Daily Living     01/24/2024   12:56 PM  In your present state of health, do you have any difficulty performing the following activities:  Hearing? 0  Vision? 0  Difficulty concentrating or making decisions? 0  Walking or climbing stairs? 0  Dressing or bathing? 0  Doing errands, shopping? 0  Preparing Food and eating ? N  Using the Toilet? N  In the past six months, have you accidently leaked urine? N  Do you have problems with loss of bowel control? N  Managing your Medications? N  Managing your Finances? N  Housekeeping or managing your Housekeeping? N    Patient Care Team: Dettinger, Elige Radon, MD as PCP - General (Family Medicine) Chilton Si, MD as PCP - Cardiology (Cardiology) Rollene Rotunda, MD (Cardiology) Vida Rigger, MD (Gastroenterology) Chilton Si, MD as Attending Physician (Cardiology) Marcelino Duster, MD as Referring Physician (Dermatology) Danella Maiers, [facility] as Pharmacist (Family Medicine)  Indicate any recent Medical Services you may have received from other than [facility] providers in the past year (date  may be approximate).     Assessment:    This is a routine wellness examination for [place].  Hearing/Vision screen Hearing Screening - Comments:: Denies hearing difficulties   Vision Screening - Comments:: No vision problems; will schedule routine eye exam soon     Goals Addressed   None    Depression Screen     01/24/2024   12:53 PM 11/25/2023    1:12 PM 07/28/2023    1:41 PM 07/14/2023    2:08 PM 03/30/2023   10:24 AM 12/14/2022    2:51 PM 11/29/2022    9:22 AM  PHQ 2/9 Scores  PHQ - 2 Score 0 0 0 0 0 0 0  PHQ- 9 Score   0 0  0 1    Fall Risk     01/24/2024   12:57 PM 11/25/2023    1:12 PM 07/28/2023    1:41 PM 07/14/2023    2:09 PM 03/30/2023   10:24 AM  Fall Risk   Falls in the past year? 0 0 0 0 0  Number falls in past yr: 0   0   Injury with Fall? 0   0   Risk for fall due to : Impaired mobility;Impaired balance/gait   No Fall Risks   Follow up Falls prevention discussed;Education provided;Falls evaluation completed   Falls evaluation completed     MEDICARE RISK AT HOME:  Medicare Risk at Home Any stairs in or around the home?: No If so, are there any without handrails?: No Home free of loose throw rugs in walkways, pet beds, electrical cords, etc?: Yes Adequate lighting in your home to reduce risk of falls?: Yes Life alert?: No Use of a cane, walker or w/c?: No Grab bars in the bathroom?: Yes Shower chair or bench in shower?: No Elevated toilet seat or a handicapped toilet?: Yes  TIMED UP AND GO:  Was the test performed?  No  Cognitive Function: 6CIT completed    11/16/2018    2:26 PM 08/16/2017    2:30 PM 05/28/2015   12:48 PM  MMSE - Mini Mental State Exam  Orientation to time 5 5 5   Orientation to Place 5 5 5   Registration 3 3 3   Attention/ Calculation 5 3 5   Recall 2 3 3   Language- name 2 objects 2 2 2   Language- repeat 1 1 1   Language- follow 3 step command 2 3 2   Language- read & follow direction 1 1 1   Write a sentence 1 1 1   Copy design 1 1 1   Total score 28 28 29          01/24/2024   12:56 PM 12/14/2022    2:53 PM 12/11/2021    1:46 PM 11/19/2020   12:14 PM 11/19/2019   11:21 AM  6CIT Screen  What Year? 0 points 0 points 0 points 0 points 0 points  What month? 0 points 0 points 0 points 0 points 0 points  What time? 0 points 0 points 0 points 0 points 0 points  Count back from 20 0 points 0 points 0 points 0 points 4 points  Months in reverse 2 points 2 points 4 points 2 points 4 points  Repeat phrase 2 points 2 points 6 points 2 points 10 points  Total Score 4 points 4 points 10 points 4 points 18 points    Immunizations Immunization History  Administered Date(s) Administered   Fluad Quad(high Dose 65+) 07/31/2019, 08/06/2020, 07/27/2021   Fluad Trivalent(High Dose 65+) 07/28/2023  Influenza Whole 06/18/2010   Influenza, High Dose Seasonal PF 08/19/2016, 08/02/2017, 08/01/2018   Influenza,inj,Quad PF,6+ Mos 07/23/2013, 07/17/2014, 08/06/2015   Moderna Sars-Covid-2 Vaccination 01/02/2020, 01/30/2020   Pneumococcal Conjugate-13 11/05/2013   Pneumococcal Polysaccharide-23 10/18/2004   Td 08/18/2010   Tdap 08/12/2011, 03/26/2022   Zoster Recombinant(Shingrix) 11/27/2021, 03/26/2022   Zoster, Live 08/26/2010    Screening Tests Health Maintenance  Topic Date Due   COVID-19 Vaccine (3 - Moderna risk series) 02/27/2020   DEXA SCAN  03/29/2024 (Originally 09/22/2022)   MAMMOGRAM  07/27/2024 (Originally 11/12/2021)   INFLUENZA VACCINE  05/18/2024   Diabetic kidney evaluation - eGFR measurement  11/24/2024   Diabetic kidney evaluation - Urine ACR  11/24/2024   Medicare Annual Wellness (AWV)  01/23/2025   DTaP/Tdap/Td (4 - Td or Tdap) 03/26/2032   Pneumonia Vaccine 79+ Years old  Completed   Zoster Vaccines- Shingrix  Completed   HPV VACCINES  Aged Out    Health Maintenance  Health Maintenance Due  Topic Date Due   COVID-19 Vaccine (3 - Moderna risk series) 02/27/2020    Additional Screening:  Vision Screening: Recommended annual ophthalmology  exams for early detection of glaucoma and other disorders of the eye.  Dental Screening: Recommended annual dental exams for proper oral hygiene  Community Resource Referral / Chronic Care Management: CRR required this visit?  No   CCM required this visit?  No     Plan:     I have personally reviewed and noted the following in the patient's chart:   Medical and social history Use of alcohol, tobacco or illicit drugs  Current medications and supplements including opioid prescriptions. Patient is not currently taking opioid prescriptions. Functional ability and status Nutritional status Physical activity Advanced directives List of other physicians Hospitalizations, surgeries, and ER visits in previous 12 months Vitals Screenings to include cognitive, depression, and falls Referrals and appointments  In addition, I have reviewed and discussed with patient certain preventive protocols, quality metrics, and best practice recommendations. A written personalized care plan for preventive services as well as general preventive health recommendations were provided to patient.     Kandis Fantasia [place], [place]   [birth year]   After Visit Summary: (Declined) Due to this being a telephonic visit, with patients personalized plan was offered to patient but patient Declined AVS at this time   Notes: Nothing significant to report at this time.

## 2024-02-06 ENCOUNTER — Telehealth: Payer: Self-pay | Admitting: Pharmacy Technician

## 2024-02-06 ENCOUNTER — Other Ambulatory Visit: Payer: Self-pay | Admitting: Family Medicine

## 2024-02-06 ENCOUNTER — Other Ambulatory Visit (HOSPITAL_COMMUNITY): Payer: Self-pay

## 2024-02-06 DIAGNOSIS — F411 Generalized anxiety disorder: Secondary | ICD-10-CM

## 2024-02-06 NOTE — Telephone Encounter (Signed)
 Pharmacy Patient Advocate Encounter  Received notification from [facility] that Prior Authorization for Oxazepam  15MG  capsules has been APPROVED from 02/06/2024 to 03/07/2024. Ran test claim, Copay is $0.00. This test claim was processed through [facility]- copay amounts may vary at other pharmacies due to pharmacy/plan contracts, or as the patient moves through the different stages of their insurance plan.   PA #/Case ID/Reference #: [id number]

## 2024-02-06 NOTE — Telephone Encounter (Signed)
 Pharmacy Patient Advocate Encounter   Received notification from CoverMyMeds that prior authorization for Oxazepam  15MG  capsules is required/requested.   Insurance verification completed.   The patient is insured through [facility] .   Per test claim: PA required; PA submitted to above mentioned insurance via CoverMyMeds Key/confirmation #/EOC [id number] Status is pending

## 2024-02-06 NOTE — Telephone Encounter (Unsigned)
 Copied from CRM [id number]. Topic: Clinical - Medication Refill >> Feb 06, 2024 10:30 AM Dimple Francis wrote: Most Recent Primary Care Visit:   Medication: oxazepam  (SERAX ) 15 MG capsule  Has the patient contacted their pharmacy? Yes (Agent: If no, request that the patient contact the pharmacy for the refill. If patient does not wish to contact the pharmacy document the reason why and proceed with request.) (Agent: If yes, when and what did the pharmacy advise?)  Is this the correct pharmacy for this prescription? Yes If no, delete pharmacy and type the correct one.  This is the patient's preferred pharmacy:  [facility], [place] - [address] [place] [address] Phone: [phone] Fax: [phone]   Has the prescription been filled recently? Yes  Is the patient out of the medication? Yes  Has the patient been seen for an appointment in the last year OR does the patient have an upcoming appointment? Yes  Can we respond through MyChart? Yes  Agent: Please be advised that Rx refills may take up to 3 business days. We ask that you follow-up with your pharmacy.

## 2024-02-08 NOTE — Telephone Encounter (Signed)
 I sent in 4 months of oxazepam  on February 7 so I do not know why we are getting this refill request.  She should have refills already at the pharmacy

## 2024-02-13 DIAGNOSIS — Z85828 Personal history of other malignant neoplasm of skin: Secondary | ICD-10-CM | POA: Diagnosis not present

## 2024-02-13 DIAGNOSIS — L570 Actinic keratosis: Secondary | ICD-10-CM | POA: Diagnosis not present

## 2024-02-13 DIAGNOSIS — L280 Lichen simplex chronicus: Secondary | ICD-10-CM | POA: Diagnosis not present

## 2024-02-13 DIAGNOSIS — D485 Neoplasm of uncertain behavior of skin: Secondary | ICD-10-CM | POA: Diagnosis not present

## 2024-02-17 ENCOUNTER — Ambulatory Visit: Payer: Self-pay

## 2024-02-17 NOTE — Telephone Encounter (Signed)
 Copied from CRM [id number]. Topic: Clinical - Prescription Issue >> Feb 17, 2024  2:03 PM [id number] D wrote: Feet are swelling and says she needs some stockings from her pcp  Chief Complaint: Feet and ankle swelling Symptoms: Swelling Frequency: Chronic, worsened today Pertinent Negatives: Patient denies redness Disposition: [] ED /[] Urgent Care (no appt availability in office) / [x] Appointment(In office/virtual)/ []  [place] [facility]/ [] Home Care/ [] Refused Recommended Disposition /[] [facility]/ []  Follow-up with PCP Additional Notes: Patient called in to report worsening swelling in her feet and ankles. Patient stated this is a chronic issue, but swelling was worse than normal today. Swelling is localized to bilateral feet and ankles. Patient stated left side is worse. Patient is still able to walk normally. Patient denied redness, fever, chest pain and difficulty breathing. Advised patient to see provider within 2 weeks, per protocol. Scheduled with PCP next Friday. Provided care advice and instructed patient to call back if symptoms worsen. Patient complied.   Reason for Disposition  Ankle swelling is a chronic symptom (recurrent or ongoing AND present > 4 weeks)  Answer Assessment - Initial Assessment Questions 1. LOCATION: "Which ankle is swollen?" "Where is the swelling?"     Both feet and ankles, left side is worse 2. ONSET: "When did the swelling start?"     Ongoing issue, but worse today 3. SWELLING: "How bad is the swelling?" Or, "How large is it?" (e.g., mild, moderate, severe; size of localized swelling)    - NONE: No joint swelling.   - LOCALIZED: Localized; small area of puffy or swollen skin (e.g., insect bite, skin irritation).   - MILD: Joint looks or feels mildly swollen or puffy.   - MODERATE: Swollen; interferes with normal activities (e.g., work or school); decreased range of movement; may be limping.   - SEVERE: Very swollen; can't move swollen joint at all;  limping a lot or unable to walk.     Mild- states she is able to walk around normally 4. PAIN: "Is there any pain?" If Yes, ask: "How bad is it?" (Scale 1-10; or mild, moderate, severe)   - NONE (0): no pain.   - MILD (1-3): doesn't interfere with normal activities.    - MODERATE (4-7): interferes with normal activities (e.g., work or school) or awakens from sleep, limping.    - SEVERE (8-10): excruciating pain, unable to do any normal activities, unable to walk.      Denies 5. CAUSE: "What do you think caused the ankle swelling?"     States this is chronic symptoms 6. OTHER SYMPTOMS: "Do you have any other symptoms?" (e.g., fever, chest pain, difficulty breathing, calf pain)     Denies redness, denies fever, denies chest pain, denies difficulty breathing  Protocols used: Ankle Swelling-A-AH

## 2024-02-23 DIAGNOSIS — C44329 Squamous cell carcinoma of skin of other parts of face: Secondary | ICD-10-CM | POA: Diagnosis not present

## 2024-02-24 ENCOUNTER — Other Ambulatory Visit: Payer: Self-pay | Admitting: Family Medicine

## 2024-02-24 ENCOUNTER — Encounter: Payer: Self-pay | Admitting: Family Medicine

## 2024-02-24 ENCOUNTER — Ambulatory Visit (INDEPENDENT_AMBULATORY_CARE_PROVIDER_SITE_OTHER): Admitting: Family Medicine

## 2024-02-24 VITALS — BP 113/69 | HR 82 | Temp 97.4°F | Wt 104.0 lb

## 2024-02-24 DIAGNOSIS — K219 Gastro-esophageal reflux disease without esophagitis: Secondary | ICD-10-CM | POA: Diagnosis not present

## 2024-02-24 DIAGNOSIS — I5033 Acute on chronic diastolic (congestive) heart failure: Secondary | ICD-10-CM | POA: Diagnosis not present

## 2024-02-24 MED ORDER — FUROSEMIDE 20 MG PO TABS
20.0000 mg | ORAL_TABLET | Freq: Every day | ORAL | 0 refills | Status: DC
Start: 2024-02-24 — End: 2024-05-25

## 2024-02-24 MED ORDER — SUCRALFATE 1 G PO TABS: 1.0000 g | ORAL_TABLET | Freq: Two times a day (BID) | ORAL | 3 refills | Status: AC

## 2024-02-24 NOTE — Progress Notes (Signed)
 BP 113/69   Pulse 82   Temp (!) 97.4 F (36.3 C)   Wt 104 lb (47.2 kg)   SpO2 96%   BMI 20.31 kg/m    Subjective:   Patient ID: [name], female    DOB: 02-Aug-1941, 83 y.o.   MRN: [id number]  HPI: [name] is a 83 y.o. female presenting on 02/24/2024 for Edema (BLE)   HPI Leg swelling and feeling somewhat short of breath. Patient has been having increased leg swelling and feels show much short of breath.  She feels like it started about a week ago and it is improved a little bit since that time.  But she still is having those symptoms.  She denies any chest pain or palpitations or flutters.  She says her weights have been stable around 103-104, but she does not always weigh yourself at the same time every day and sometimes she is wearing close and sometimes she is not.  She says her shortness of breath is mainly on exertion, not just sitting here.  Relevant past medical, surgical, family and social history reviewed and updated as indicated. Interim medical history since our last visit reviewed. Allergies and medications reviewed and updated.  Review of Systems  Constitutional:  Negative for chills and fever.  HENT:  Negative for congestion, ear discharge and ear pain.   Eyes:  Negative for redness and visual disturbance.  Respiratory:  Positive for shortness of breath. Negative for chest tightness.   Cardiovascular:  Positive for leg swelling. Negative for chest pain.  Genitourinary:  Negative for difficulty urinating and dysuria.  Musculoskeletal:  Negative for back pain and gait problem.  Skin:  Negative for rash.  Neurological:  Negative for light-headedness and headaches.  Psychiatric/Behavioral:  Negative for agitation and behavioral problems.   All other systems reviewed and are negative.   Per HPI unless specifically indicated above   Allergies as of 02/24/2024       Reactions   Bisphosphonates Other (See Comments)   Difficulty swallowing    Ciprofloxacin Nausea And Vomiting   Codeine  Nausea And Vomiting   Sulfa Antibiotics Nausea And Vomiting        Medication List        Accurate as of Feb 24, 2024  9:30 AM. If you have any questions, ask your nurse or doctor.          acetaminophen  500 MG tablet Commonly known as: TYLENOL  Take 500 mg by mouth every 8 (eight) hours as needed for mild pain or moderate pain.   albuterol  108 (90 Base) MCG/ACT inhaler Commonly known as: VENTOLIN  HFA USE 2 PUFFS EVERY 6 HOURS AS NEEDED   apixaban  2.5 MG Tabs tablet Commonly known as: ELIQUIS  Take 1 tablet (2.5 mg total) by mouth 2 (two) times daily.   atorvastatin  80 MG tablet Commonly known as: LIPITOR TAKE ONE (1) TABLET BY MOUTH EVERY DAY   Commode Bedside Misc Bedside commode due to tibial fracture   Wheelchair Misc Pt needs wheelchair due to extremity fractures   Misc. Devices Misc Use rolling walker to ambulate around the house as needed.   diltiazem  240 MG 24 hr capsule Commonly known as: CARDIZEM  CD Take 1 capsule (240 mg total) by mouth daily.   furosemide  20 MG tablet Commonly known as: LASIX  Take 1 tablet (20 mg total) by mouth daily. What changed: Another medication with the same name was added. Make sure you understand how and when to take each. Changed by:  Doni Widmer A Ji Fairburn   furosemide  20 MG tablet Commonly known as: LASIX  Take 1 tablet (20 mg total) by mouth daily. Take an extra furosemide  for 3 days What changed: You were already taking a medication with the same name, and this prescription was added. Make sure you understand how and when to take each. Changed by: Lucio Sabin Juhi Lagrange   levothyroxine  25 MCG tablet Commonly known as: SYNTHROID  Take 1 tablet (25 mcg total) by mouth daily.   lidocaine  4 % Commonly known as: HM Lidocaine  Patch Place 1 patch onto the skin daily.   loperamide  2 MG capsule Commonly known as: IMODIUM  Take 1 capsule (2 mg total) by mouth 4 (four) times daily as  needed for diarrhea or loose stools.   metoprolol  tartrate 25 MG tablet Commonly known as: LOPRESSOR  Take 1 tablet (25 mg total) by mouth 2 (two) times daily. as directed   mirtazapine  15 MG disintegrating tablet Commonly known as: REMERON  SOL-TAB Take 1 tablet (15 mg total) by mouth at bedtime.   mometasone  50 MCG/ACT nasal spray Commonly known as: Nasonex  Place 2 sprays into the nose daily.   omeprazole  40 MG capsule Commonly known as: PRILOSEC TAKE ONE CAPSULE BY MOUTH DAILY   ondansetron  4 MG disintegrating tablet Commonly known as: ZOFRAN -ODT Take 1 tablet (4 mg total) by mouth every 8 (eight) hours as needed.   oxazepam  15 MG capsule Commonly known as: SERAX  Take 1 capsule (15 mg total) by mouth 3 (three) times daily as needed for sleep. Cancel any other prescriptions on file   polyethylene glycol 17 g packet Commonly known as: MIRALAX  / GLYCOLAX  Take 17 g by mouth daily as needed for moderate constipation.   sucralfate  1 g tablet Commonly known as: CARAFATE  Take 1 tablet (1 g total) by mouth 2 (two) times daily.   triamcinolone  ointment 0.5 % Commonly known as: KENALOG  APPLY TO THE AFFECTED AREA(S) TOPICALLY TWICE DAILY   Vitamin D3 50 MCG (2000 UT) Chew Chew 4,000 Units by mouth daily.         Objective:   BP 113/69   Pulse 82   Temp (!) 97.4 F (36.3 C)   Wt 104 lb (47.2 kg)   SpO2 96%   BMI 20.31 kg/m   Wt Readings from Last 3 Encounters:  02/24/24 104 lb (47.2 kg)  01/24/24 104 lb (47.2 kg)  11/25/23 104 lb (47.2 kg)    Physical Exam Vitals and nursing note reviewed.  Constitutional:      General: She is not in acute distress.    Appearance: She is well-developed. She is not diaphoretic.  Eyes:     Conjunctiva/sclera: Conjunctivae normal.  Cardiovascular:     Rate and Rhythm: Normal rate and regular rhythm.     Heart sounds: Normal heart sounds. No murmur heard. Pulmonary:     Effort: Pulmonary effort is normal. No respiratory  distress.     Breath sounds: Normal breath sounds. No wheezing or rhonchi.  Musculoskeletal:        General: Swelling (2+ peripheral edema bilaterally lower extremity) present. No tenderness. Normal range of motion.  Skin:    General: Skin is warm and dry.     Findings: No rash.  Neurological:     Mental Status: She is alert and oriented to person, place, and time.     Coordination: Coordination normal.  Psychiatric:        Behavior: Behavior normal.       Assessment & Plan:   Problem  List Items Addressed This Visit       Cardiovascular and Mediastinum   Acute on chronic diastolic CHF (congestive heart failure) (HCC) - Primary   Relevant Medications   furosemide  (LASIX ) 20 MG tablet    Recommended the patient take 1 extra furosemide  for the next 3 days and monitor weight Follow up plan: Return if symptoms worsen or fail to improve.  Counseling provided for all of the vaccine components No orders of the defined types were placed in this encounter.   Jolyne Needs, MD [facility] 02/24/2024, 9:30 AM

## 2024-03-06 ENCOUNTER — Other Ambulatory Visit: Payer: Self-pay | Admitting: Family Medicine

## 2024-03-06 DIAGNOSIS — K219 Gastro-esophageal reflux disease without esophagitis: Secondary | ICD-10-CM

## 2024-03-06 DIAGNOSIS — F411 Generalized anxiety disorder: Secondary | ICD-10-CM

## 2024-03-21 ENCOUNTER — Telehealth: Payer: Self-pay | Admitting: Pharmacist

## 2024-03-21 NOTE — Telephone Encounter (Signed)
   This patient is appearing on a report for being at risk of failing the adherence measure for cholesterol (statin) medications this calendar year.   Medication: atorvastatin  Last fill date: 02/24/24 for 90 day supply  Insurance report was not up to date. No action needed at this time.    Joury Allcorn Dattero Jonathon Castelo, PharmD, BCACP, CPP Clinical Pharmacist, [facility]

## 2024-03-23 ENCOUNTER — Other Ambulatory Visit: Payer: Self-pay | Admitting: Family Medicine

## 2024-03-23 DIAGNOSIS — I4891 Unspecified atrial fibrillation: Secondary | ICD-10-CM

## 2024-03-28 ENCOUNTER — Ambulatory Visit: Payer: Medicare HMO | Admitting: Family Medicine

## 2024-03-28 ENCOUNTER — Encounter: Payer: Self-pay | Admitting: Family Medicine

## 2024-03-28 VITALS — BP 124/74 | HR 68 | Temp 97.7°F | Ht 60.0 in | Wt 102.2 lb

## 2024-03-28 DIAGNOSIS — R600 Localized edema: Secondary | ICD-10-CM | POA: Diagnosis not present

## 2024-03-28 DIAGNOSIS — F411 Generalized anxiety disorder: Secondary | ICD-10-CM

## 2024-03-28 DIAGNOSIS — I10 Essential (primary) hypertension: Secondary | ICD-10-CM

## 2024-03-28 DIAGNOSIS — N1832 Chronic kidney disease, stage 3b: Secondary | ICD-10-CM | POA: Diagnosis not present

## 2024-03-28 DIAGNOSIS — E782 Mixed hyperlipidemia: Secondary | ICD-10-CM

## 2024-03-28 DIAGNOSIS — R7303 Prediabetes: Secondary | ICD-10-CM | POA: Diagnosis not present

## 2024-03-28 DIAGNOSIS — E034 Atrophy of thyroid (acquired): Secondary | ICD-10-CM | POA: Diagnosis not present

## 2024-03-28 LAB — BAYER DCA HB A1C WAIVED: HB A1C (BAYER DCA - WAIVED): 5.7 % — ABNORMAL HIGH (ref 4.8–5.6)

## 2024-03-28 MED ORDER — OXAZEPAM 15 MG PO CAPS
15.0000 mg | ORAL_CAPSULE | Freq: Three times a day (TID) | ORAL | 3 refills | Status: DC | PRN
Start: 2024-03-28 — End: 2024-04-23

## 2024-03-28 NOTE — Addendum Note (Signed)
 Addended by: Jolyne Needs on: 03/28/2024 10:52 AM   Modules accepted: Orders

## 2024-03-28 NOTE — Progress Notes (Addendum)
 BP 124/74   Pulse 68   Temp 97.7 F (36.5 C) (Temporal)   Ht 5' (1.524 m)   Wt 102 lb 3.2 oz (46.4 kg)   SpO2 93%   BMI 19.96 kg/m    Subjective:   Patient ID: [name], female    DOB: July 02, 1941, 83 y.o.   MRN: [id number]  HPI: [name] is a 83 y.o. female presenting on 03/28/2024 for Medical Management of Chronic Issues (4 month) and Abdominal Pain (Upper abdominal pain x1 month, thinks it is related to hernia)   HPI Hyperlipidemia Patient is coming in for recheck of his hyperlipidemia. The patient is currently taking atorvastatin . They deny any issues with myalgias or history of liver damage from it. They deny any focal numbness or weakness or chest pain.   Hypothyroidism recheck Patient is coming in for thyroid  recheck today as well. They deny any issues with hair changes or heat or cold problems or diarrhea or constipation. They deny any chest pain or palpitations. They are currently on levothyroxine  25 micrograms   Hypertension and CKD Patient is currently on diltiazem  and metoprolol , and their blood pressure today is 124/74. Patient denies any lightheadedness or dizziness. Patient denies headaches, blurred vision, chest pains, shortness of breath, or weakness. Denies any side effects from medication and is content with current medication.   Prediabetes Patient comes in today for recheck of his diabetes. Patient has been currently taking no medicine currently, diet control. Patient is not currently on an ACE inhibitor/ARB. Patient has not seen an ophthalmologist this year. Patient denies any new issues with their feet. The symptom started onset as an adult hypothyroidism and hypertension and hyperlipidemia ARE RELATED TO DM   Patient has some upper abdominal pain but some generalized abdominal pain.  She admits that she does not have bowel movements regularly and cannot recall the last time she had 1.  She does not eat a lot but denies any reflux symptoms today.   She is already on omeprazole  for that as well  Anxiety recheck Current rx-oxazepam  15 mg 3 times daily. # meds rx-90/month Effectiveness of current meds-works well Adverse reactions form meds-none Pill count performed-No Last drug screen -12/07/2023 ( high risk q51m, moderate risk q61m, low risk yearly ) Urine drug screen today- No Was the [place] reviewed-yes  If yes were their any concerning findings? -None Controlled substance contract signed on: 12/07/2023  Relevant past medical, surgical, family and social history reviewed and updated as indicated. Interim medical history since our last visit reviewed. Allergies and medications reviewed and updated.  Review of Systems  Constitutional:  Negative for chills and fever.  HENT:  Negative for congestion, ear discharge and ear pain.   Eyes:  Negative for redness and visual disturbance.  Respiratory:  Negative for chest tightness and shortness of breath.   Cardiovascular:  Negative for chest pain and leg swelling.  Genitourinary:  Negative for difficulty urinating and dysuria.  Musculoskeletal:  Negative for back pain and gait problem.  Skin:  Negative for rash.  Neurological:  Negative for dizziness, light-headedness and headaches.  Psychiatric/Behavioral:  Negative for agitation and behavioral problems.   All other systems reviewed and are negative.   Per HPI unless specifically indicated above   Allergies as of 03/28/2024       Reactions   Bisphosphonates Other (See Comments)   Difficulty swallowing   Ciprofloxacin Nausea And Vomiting   Codeine  Nausea And Vomiting   Sulfa Antibiotics Nausea And Vomiting  Medication List        Accurate as of March 28, 2024 10:52 AM. If you have any questions, ask your nurse or doctor.          acetaminophen  500 MG tablet Commonly known as: TYLENOL  Take 500 mg by mouth every 8 (eight) hours as needed for mild pain or moderate pain.   albuterol  108 (90 Base) MCG/ACT  inhaler Commonly known as: VENTOLIN  HFA USE 2 PUFFS EVERY 6 HOURS AS NEEDED   atorvastatin  80 MG tablet Commonly known as: LIPITOR TAKE ONE (1) TABLET BY MOUTH EVERY DAY   Commode Bedside Misc Bedside commode due to tibial fracture   Wheelchair Misc Pt needs wheelchair due to extremity fractures   Misc. Devices Misc Use rolling walker to ambulate around the house as needed.   diltiazem  240 MG 24 hr capsule Commonly known as: CARDIZEM  CD Take 1 capsule (240 mg total) by mouth daily.   Eliquis  2.5 MG Tabs tablet Generic drug: apixaban  TAKE ONE (1) TABLET BY MOUTH TWO (2) TIMES DAILY   furosemide  20 MG tablet Commonly known as: LASIX  Take 1 tablet (20 mg total) by mouth daily.   furosemide  20 MG tablet Commonly known as: LASIX  Take 1 tablet (20 mg total) by mouth daily. Take an extra furosemide  for 3 days   levothyroxine  25 MCG tablet Commonly known as: SYNTHROID  Take 1 tablet (25 mcg total) by mouth daily.   lidocaine  4 % Commonly known as: HM Lidocaine  Patch Place 1 patch onto the skin daily.   loperamide  2 MG capsule Commonly known as: IMODIUM  Take 1 capsule (2 mg total) by mouth 4 (four) times daily as needed for diarrhea or loose stools.   metoprolol  tartrate 25 MG tablet Commonly known as: LOPRESSOR  TAKE ONE (1) TABLET BY MOUTH TWO (2) TIMES DAILY   mirtazapine  15 MG disintegrating tablet Commonly known as: REMERON  SOL-TAB Take 1 tablet (15 mg total) by mouth at bedtime.   mometasone  50 MCG/ACT nasal spray Commonly known as: Nasonex  Place 2 sprays into the nose daily.   omeprazole  40 MG capsule Commonly known as: PRILOSEC TAKE ONE CAPSULE BY MOUTH DAILY   ondansetron  4 MG disintegrating tablet Commonly known as: ZOFRAN -ODT Take 1 tablet (4 mg total) by mouth every 8 (eight) hours as needed.   oxazepam  15 MG capsule Commonly known as: SERAX  Take 1 capsule (15 mg total) by mouth 3 (three) times daily as needed for sleep. Cancel any other  prescriptions on file   polyethylene glycol 17 g packet Commonly known as: MIRALAX  / GLYCOLAX  Take 17 g by mouth daily as needed for moderate constipation.   sucralfate  1 g tablet Commonly known as: CARAFATE  Take 1 tablet (1 g total) by mouth 2 (two) times daily.   triamcinolone  ointment 0.5 % Commonly known as: KENALOG  APPLY TO THE AFFECTED AREA(S) TOPICALLY TWICE DAILY   Vitamin D3 50 MCG (2000 UT) Chew Chew 4,000 Units by mouth daily.         Objective:   BP 124/74   Pulse 68   Temp 97.7 F (36.5 C) (Temporal)   Ht 5' (1.524 m)   Wt 102 lb 3.2 oz (46.4 kg)   SpO2 93%   BMI 19.96 kg/m   Wt Readings from Last 3 Encounters:  03/28/24 102 lb 3.2 oz (46.4 kg)  02/24/24 104 lb (47.2 kg)  01/24/24 104 lb (47.2 kg)    Physical Exam Vitals and nursing note reviewed.  Constitutional:      General:  She is not in acute distress.    Appearance: She is well-developed. She is not diaphoretic.  Eyes:     Conjunctiva/sclera: Conjunctivae normal.  Cardiovascular:     Rate and Rhythm: Normal rate and regular rhythm.     Heart sounds: Normal heart sounds. No murmur heard. Pulmonary:     Effort: Pulmonary effort is normal. No respiratory distress.     Breath sounds: Normal breath sounds. No wheezing.  Musculoskeletal:        General: Swelling (1+ peripheral edema) present.  Skin:    General: Skin is warm and dry.     Findings: No rash.  Neurological:     Mental Status: She is alert and oriented to person, place, and time.     Coordination: Coordination normal.  Psychiatric:        Behavior: Behavior normal.       Assessment & Plan:   Problem List Items Addressed This Visit       Cardiovascular and Mediastinum   Essential hypertension - Primary   Relevant Orders   CBC with Differential/Platelet   CMP14+EGFR   Compression stockings     Endocrine   Hypothyroid   Relevant Orders   TSH     Genitourinary   Chronic kidney disease (CKD) stage G3b/A1,  moderately decreased glomerular filtration rate (GFR) between 30-44 mL/min/1.73 square meter and albuminuria creatinine ratio less than 30 mg/g (HCC)   Relevant Orders   CBC with Differential/Platelet   CMP14+EGFR     Other   Hyperlipemia   Relevant Orders   Lipid panel   Generalized anxiety disorder   Relevant Medications   oxazepam  (SERAX ) 15 MG capsule   Prediabetes   Relevant Orders   Bayer DCA Hb A1c Waived (Completed)   Other Visit Diagnoses       Peripheral edema       Relevant Orders   Compression stockings       A1c looks good at 5.7.  Blood pressure everything looks good. Continue current medicine, recommended that she use MiraLAX  to help with her bowel symptoms, think that she is likely just constipated Follow up plan: Return in about 4 months (around 07/28/2024), or if symptoms worsen or fail to improve, for htn and thyroid .  Counseling provided for all of the vaccine components Orders Placed This Encounter  Procedures   Compression stockings   Bayer DCA Hb A1c Waived   CBC with Differential/Platelet   CMP14+EGFR   Lipid panel   TSH    Jolyne Needs, MD [facility] 03/28/2024, 10:52 AM

## 2024-03-29 LAB — CMP14+EGFR
ALT: 9 IU/L (ref 0–32)
AST: 20 IU/L (ref 0–40)
Albumin: 4 g/dL (ref 3.7–4.7)
Alkaline Phosphatase: 101 IU/L (ref 44–121)
BUN/Creatinine Ratio: 11 — ABNORMAL LOW (ref 12–28)
BUN: 16 mg/dL (ref 8–27)
Bilirubin Total: 0.4 mg/dL (ref 0.0–1.2)
CO2: 23 mmol/L (ref 20–29)
Calcium: 9.2 mg/dL (ref 8.7–10.3)
Chloride: 102 mmol/L (ref 96–106)
Creatinine, Ser: 1.41 mg/dL — ABNORMAL HIGH (ref 0.57–1.00)
Globulin, Total: 2.7 g/dL (ref 1.5–4.5)
Glucose: 78 mg/dL (ref 70–99)
Potassium: 3.9 mmol/L (ref 3.5–5.2)
Sodium: 141 mmol/L (ref 134–144)
Total Protein: 6.7 g/dL (ref 6.0–8.5)
eGFR: 37 mL/min/{1.73_m2} — ABNORMAL LOW (ref >59)

## 2024-03-29 LAB — LIPID PANEL
Chol/HDL Ratio: 2.8 ratio (ref 0.0–4.4)
Cholesterol, Total: 126 mg/dL (ref 100–199)
HDL: 45 mg/dL (ref >39)
LDL Chol Calc (NIH): 64 mg/dL (ref 0–99)
Triglycerides: 85 mg/dL (ref 0–149)
VLDL Cholesterol Cal: 17 mg/dL (ref 5–40)

## 2024-03-29 LAB — CBC WITH DIFFERENTIAL/PLATELET
Basophils Absolute: 0.1 10*3/uL (ref 0.0–0.2)
Basos: 1 %
EOS (ABSOLUTE): 0.2 10*3/uL (ref 0.0–0.4)
Eos: 4 %
Hematocrit: 36.1 % (ref 34.0–46.6)
Hemoglobin: 11.8 g/dL (ref 11.1–15.9)
Immature Grans (Abs): 0 10*3/uL (ref 0.0–0.1)
Immature Granulocytes: 0 %
Lymphocytes Absolute: 1.4 10*3/uL (ref 0.7–3.1)
Lymphs: 26 %
MCH: 29.9 pg (ref 26.6–33.0)
MCHC: 32.7 g/dL (ref 31.5–35.7)
MCV: 92 fL (ref 79–97)
Monocytes Absolute: 0.6 10*3/uL (ref 0.1–0.9)
Monocytes: 10 %
Neutrophils Absolute: 3.3 10*3/uL (ref 1.4–7.0)
Neutrophils: 59 %
Platelets: 241 10*3/uL (ref 150–450)
RBC: 3.94 x10E6/uL (ref 3.77–5.28)
RDW: 13.8 % (ref 11.7–15.4)
WBC: 5.6 10*3/uL (ref 3.4–10.8)

## 2024-03-29 LAB — TSH: TSH: 0.992 u[IU]/mL (ref 0.450–4.500)

## 2024-04-04 ENCOUNTER — Ambulatory Visit: Payer: Self-pay | Admitting: Family Medicine

## 2024-04-04 ENCOUNTER — Telehealth: Payer: Self-pay

## 2024-04-04 NOTE — Telephone Encounter (Signed)
 Pt states that her insurance will be changing July 1st. With the new insurance her anxiety medication (Sera) will not be covered. Does she need an appointment to change medications?

## 2024-04-05 NOTE — Telephone Encounter (Signed)
 Called pt x2 on home phone. Would ring and someone picked up but never said anything then hung up. Call cell phone and left detailed message about appt being scheduled 7/7 at 2:25pm. Advised to call back if unable to make appt.

## 2024-04-05 NOTE — Telephone Encounter (Signed)
 Yes she would need an appointment for medication change, it is a controlled substance for anxiety so we would have to discuss options.  Looks like she would be due for refill around July 11 so you can make an appointment for her then or right before then

## 2024-04-06 ENCOUNTER — Other Ambulatory Visit: Payer: Self-pay | Admitting: Family Medicine

## 2024-04-06 DIAGNOSIS — R052 Subacute cough: Secondary | ICD-10-CM

## 2024-04-09 ENCOUNTER — Telehealth: Payer: Self-pay | Admitting: Pharmacy Technician

## 2024-04-09 NOTE — Telephone Encounter (Signed)
 Pharmacy Patient Advocate Encounter   Received notification from Onbase that prior authorization for Oxazepam  15MG  capsules is required/requested.   Insurance verification completed.   The patient is insured through [facility] .   Per test claim: PA required; PA submitted to above mentioned insurance via CoverMyMeds Key/confirmation #/EOC [id number] Status is pending

## 2024-04-10 ENCOUNTER — Other Ambulatory Visit (HOSPITAL_COMMUNITY): Payer: Self-pay

## 2024-04-11 ENCOUNTER — Other Ambulatory Visit (HOSPITAL_COMMUNITY): Payer: Self-pay

## 2024-04-11 NOTE — Telephone Encounter (Signed)
 Pharmacy Patient Advocate Encounter  Received notification from [facility] that Prior Authorization for Oxazepam  15MG  capsules has been APPROVED from 10/19/2023 to 05/09/2024. Unable to obtain price due to refill too soon rejection, last fill date 04/09/2024 next available fill date07/16/2025.   PA #/Case ID/Reference #: [id number]

## 2024-04-17 ENCOUNTER — Telehealth: Payer: Self-pay | Admitting: Family Medicine

## 2024-04-17 NOTE — Telephone Encounter (Signed)
 This medication was approved. See phone thread from PA team. Patient has picked this medication up.

## 2024-04-17 NOTE — Telephone Encounter (Signed)
 Copied from CRM [id number]. Topic: Clinical - Prescription Issue >> Apr 17, 2024 11:19 AM Essie A wrote: Reason for CRM: [employer] - Ed is calling to get clarification on medication for patient.  The medication oxazepam  (SERAX ) 15 MG capsule non-formulary.  Patient's options are prior authorization or an alternative which will cost a little less.  Member would like to try the alternative of chlordiazepoxide if the doctor approves a prescription.  If not then a prior authorization needs to be done.  Provider services phone number for PA is [phone] or any other questions.  Please contact the patient with a decision from the doctor by calling [phone].

## 2024-04-23 ENCOUNTER — Ambulatory Visit (INDEPENDENT_AMBULATORY_CARE_PROVIDER_SITE_OTHER): Admitting: Family Medicine

## 2024-04-23 ENCOUNTER — Encounter: Payer: Self-pay | Admitting: Family Medicine

## 2024-04-23 VITALS — BP 113/73 | HR 61 | Ht 60.0 in | Wt 101.0 lb

## 2024-04-23 DIAGNOSIS — F419 Anxiety disorder, unspecified: Secondary | ICD-10-CM

## 2024-04-23 MED ORDER — LORAZEPAM 1 MG PO TABS: 1.0000 mg | ORAL_TABLET | Freq: Three times a day (TID) | ORAL | 2 refills | Status: DC | PRN

## 2024-04-23 NOTE — Progress Notes (Signed)
 BP 113/73   Pulse 61   Ht 5' (1.524 m)   Wt 101 lb (45.8 kg)   BMI 19.73 kg/m    Subjective:   Patient ID: [name], female    DOB: November 04, 1940, 83 y.o.   MRN: [id number]  HPI: SHAKIRA LOS is a 83 y.o. female presenting on 04/23/2024 for Medical Management of Chronic Issues and Anxiety (Ins. Will no longer cover Serax )   HPI Anxiety recheck Current rx-was on oxazepam  but her insurance company will no longer pay for it, 15 mg 3 times daily.  Will switch to lorazepam  1 mg 3 times daily # meds rx- 90 Effectiveness of current meds-works well Adverse reactions form meds-none Pill count performed-No Last drug screen -12/07/2023 ( high risk q52m, moderate risk q61m, low risk yearly ) Urine drug screen today- No Was the [facility] reviewed-yes  If yes were their any concerning findings? -None Controlled substance contract signed on: 12/07/2023  Relevant past medical, surgical, family and social history reviewed and updated as indicated. Interim medical history since our last visit reviewed. Allergies and medications reviewed and updated.  Review of Systems  Constitutional:  Negative for chills and fever.  Eyes:  Negative for redness and visual disturbance.  Respiratory:  Negative for chest tightness and shortness of breath.   Cardiovascular:  Negative for chest pain and leg swelling.  Musculoskeletal:  Negative for back pain and gait problem.  Skin:  Negative for rash.  Neurological:  Negative for light-headedness and headaches.  Psychiatric/Behavioral:  Negative for agitation and behavioral problems. The patient is nervous/anxious.   All other systems reviewed and are negative.   Per HPI unless specifically indicated above   Allergies as of 04/23/2024       Reactions   Bisphosphonates Other (See Comments)   Difficulty swallowing   Ciprofloxacin Nausea And Vomiting   Codeine  Nausea And Vomiting   Sulfa Antibiotics Nausea And Vomiting        Medication List         Accurate as of April 23, 2024  3:02 PM. If you have any questions, ask your nurse or doctor.          STOP taking these medications    oxazepam  15 MG capsule Commonly known as: SERAX        TAKE these medications    acetaminophen  500 MG tablet Commonly known as: TYLENOL  Take 500 mg by mouth every 8 (eight) hours as needed for mild pain or moderate pain.   albuterol  108 (90 Base) MCG/ACT inhaler Commonly known as: VENTOLIN  HFA USE 2 PUFFS EVERY 6 HOURS AS NEEDED   atorvastatin  80 MG tablet Commonly known as: LIPITOR TAKE ONE (1) TABLET BY MOUTH EVERY DAY   Commode Bedside Misc Bedside commode due to tibial fracture   Wheelchair Misc Pt needs wheelchair due to extremity fractures   Misc. Devices Misc Use rolling walker to ambulate around the house as needed.   diltiazem  240 MG 24 hr capsule Commonly known as: CARDIZEM  CD Take 1 capsule (240 mg total) by mouth daily.   Eliquis  2.5 MG Tabs tablet Generic drug: apixaban  TAKE ONE (1) TABLET BY MOUTH TWO (2) TIMES DAILY   furosemide  20 MG tablet Commonly known as: LASIX  Take 1 tablet (20 mg total) by mouth daily.   furosemide  20 MG tablet Commonly known as: LASIX  Take 1 tablet (20 mg total) by mouth daily. Take an extra furosemide  for 3 days   levothyroxine  25 MCG tablet Commonly known as:  SYNTHROID  Take 1 tablet (25 mcg total) by mouth daily.   lidocaine  4 % Commonly known as: HM Lidocaine  Patch Place 1 patch onto the skin daily.   loperamide  2 MG capsule Commonly known as: IMODIUM  Take 1 capsule (2 mg total) by mouth 4 (four) times daily as needed for diarrhea or loose stools.   LORazepam  1 MG tablet Commonly known as: Ativan  Take 1 tablet (1 mg total) by mouth every 8 (eight) hours as needed for anxiety.   metoprolol  tartrate 25 MG tablet Commonly known as: LOPRESSOR  TAKE ONE (1) TABLET BY MOUTH TWO (2) TIMES DAILY   mirtazapine  15 MG disintegrating tablet Commonly known as: REMERON  SOL-TAB Take  1 tablet (15 mg total) by mouth at bedtime.   mometasone  50 MCG/ACT nasal spray Commonly known as: Nasonex  Place 2 sprays into the nose daily.   omeprazole  40 MG capsule Commonly known as: PRILOSEC TAKE ONE CAPSULE BY MOUTH DAILY   ondansetron  4 MG disintegrating tablet Commonly known as: ZOFRAN -ODT Take 1 tablet (4 mg total) by mouth every 8 (eight) hours as needed.   polyethylene glycol 17 g packet Commonly known as: MIRALAX  / GLYCOLAX  Take 17 g by mouth daily as needed for moderate constipation.   sucralfate  1 g tablet Commonly known as: CARAFATE  Take 1 tablet (1 g total) by mouth 2 (two) times daily.   triamcinolone  ointment 0.5 % Commonly known as: KENALOG  APPLY TO THE AFFECTED AREA(S) TOPICALLY TWICE DAILY   Vitamin D3 50 MCG (2000 UT) Chew Chew 4,000 Units by mouth daily.         Objective:   BP 113/73   Pulse 61   Ht 5' (1.524 m)   Wt 101 lb (45.8 kg)   BMI 19.73 kg/m   Wt Readings from Last 3 Encounters:  04/23/24 101 lb (45.8 kg)  03/28/24 102 lb 3.2 oz (46.4 kg)  02/24/24 104 lb (47.2 kg)    Physical Exam Vitals and nursing note reviewed.  Constitutional:      Appearance: Normal appearance.  Neurological:     General: No focal deficit present.     Mental Status: She is alert and oriented to person, place, and time. Mental status is at baseline.  Psychiatric:        Mood and Affect: Mood is anxious. Mood is not depressed.        Thought Content: Thought content does not include suicidal ideation. Thought content does not include suicidal plan.       Assessment & Plan:   Problem List Items Addressed This Visit       Other   Anxiety - Primary   Relevant Medications   LORazepam  (ATIVAN ) 1 MG tablet    Switch to lorazepam , warned patient of some withdrawal symptoms that may occur on the transition between oxazepam  and this but insurance will not cover the other 1 and she cannot pay to $100 a month. Follow up plan: Return if symptoms  worsen or fail to improve, for Routine visit 3 months.  Counseling provided for all of the vaccine components No orders of the defined types were placed in this encounter.   Fonda Levins, MD [facility] 04/23/2024, 3:02 PM

## 2024-05-10 ENCOUNTER — Other Ambulatory Visit: Payer: Self-pay | Admitting: Family Medicine

## 2024-05-25 ENCOUNTER — Other Ambulatory Visit: Payer: Self-pay | Admitting: Family Medicine

## 2024-05-25 DIAGNOSIS — I10 Essential (primary) hypertension: Secondary | ICD-10-CM

## 2024-05-29 ENCOUNTER — Telehealth: Payer: Self-pay

## 2024-05-29 NOTE — Telephone Encounter (Signed)
 Copied from CRM [id number]. Topic: Clinical - Medication Question >> May 29, 2024 11:30 AM Tiffany B wrote: Reason for CRM:  Insurance calling on patient behalf stating LORazepam  (ATIVAN ) 1 MG tablet is causing patient to feel weak. (Will send NT a message to follow up regarding patient symptoms).   Patient is requesting to try an alternate medication: oxazepam .   As per [facility] ozazepam is not covered and requesting a PA.   Phone prescription related # [phone] or medical PA [phone]  Fax # [phone]

## 2024-05-30 ENCOUNTER — Other Ambulatory Visit: Payer: Self-pay | Admitting: Family Medicine

## 2024-05-30 DIAGNOSIS — K219 Gastro-esophageal reflux disease without esophagitis: Secondary | ICD-10-CM

## 2024-05-30 NOTE — Telephone Encounter (Signed)
 She will need an office visit to discuss any such changes because it is controlled

## 2024-05-30 NOTE — Telephone Encounter (Signed)
 Left message on cell instructing pt that she will need an appt to discuss. Can use a same day or dod this week or next week.  Tried calling home number. No ring, sounded like someone picked up but had no response or vmail.

## 2024-06-01 ENCOUNTER — Encounter: Payer: Self-pay | Admitting: Family Medicine

## 2024-06-01 ENCOUNTER — Ambulatory Visit (INDEPENDENT_AMBULATORY_CARE_PROVIDER_SITE_OTHER): Admitting: Family Medicine

## 2024-06-01 VITALS — BP 129/81 | HR 84 | Ht 60.0 in | Wt 99.0 lb

## 2024-06-01 DIAGNOSIS — F411 Generalized anxiety disorder: Secondary | ICD-10-CM | POA: Diagnosis not present

## 2024-06-01 MED ORDER — OXAZEPAM 15 MG PO CAPS: 15.0000 mg | ORAL_CAPSULE | Freq: Three times a day (TID) | ORAL | 1 refills | Status: DC | PRN

## 2024-06-01 NOTE — Telephone Encounter (Signed)
 Seen today.

## 2024-06-01 NOTE — Progress Notes (Signed)
 BP 129/81   Pulse 84   Ht 5' (1.524 m)   Wt 99 lb (44.9 kg)   SpO2 95%   BMI 19.33 kg/m    Subjective:   Patient ID: [name], female    DOB: Sep 06, 1941, 83 y.o.   MRN: [id number]  HPI: [name] is a 83 y.o. female presenting on 06/01/2024 for Medical Management of Chronic Issues and Anxiety (Wants to go back on original anxiety med. States that insurance says it will be 4.99)   Discussed the use of AI scribe software for clinical note transcription with the patient, who gave verbal consent to proceed.  History of Present Illness   [name] is an 83 year old female who presents with issues related to anxiety medication management.  She is experiencing excessive sedation and lack of energy with her current anxiety medication, which causes her to sleep most of the day. The medication feels too strong, making her feel terrible and unable to walk from her car to the house. She has been taking only a half dose due to these side effects.  The switch in medication was initially due to insurance coverage issues. However, she has now been informed that her previous medication, oxazepam , will be covered at a cost of $4.99. She wants to switch back to oxazepam , as it was more effective for her.  She experiences diarrhea after consuming Boost nutritional drinks, which she has been using to gain weight. Drinking a full Boost in the morning causes diarrhea, and she plans to try consuming it in halves to mitigate this effect. She is trying to gain weight and has been eating eggs and tomatoes with the drink.         Relevant past medical, surgical, family and social history reviewed and updated as indicated. Interim medical history since our last visit reviewed. Allergies and medications reviewed and updated.  Review of Systems  Constitutional:  Positive for fatigue. Negative for chills and fever.  HENT:  Negative for congestion, ear discharge and ear pain.   Eyes:   Negative for redness and visual disturbance.  Respiratory:  Negative for chest tightness and shortness of breath.   Cardiovascular:  Negative for chest pain and leg swelling.  Genitourinary:  Negative for difficulty urinating and dysuria.  Musculoskeletal:  Negative for back pain and gait problem.  Skin:  Negative for rash.  Neurological:  Negative for light-headedness and headaches.  Psychiatric/Behavioral:  Positive for dysphoric mood. Negative for agitation and behavioral problems. The patient is nervous/anxious.   All other systems reviewed and are negative.   Per HPI unless specifically indicated above   Allergies as of 06/01/2024       Reactions   Bisphosphonates Other (See Comments)   Difficulty swallowing   Ciprofloxacin Nausea And Vomiting   Codeine  Nausea And Vomiting   Sulfa Antibiotics Nausea And Vomiting        Medication List        Accurate as of June 01, 2024  4:25 PM. If you have any questions, ask your nurse or doctor.          STOP taking these medications    LORazepam  1 MG tablet Commonly known as: Ativan  Stopped by: Fonda LABOR Jemila Camille       TAKE these medications    acetaminophen  500 MG tablet Commonly known as: TYLENOL  Take 500 mg by mouth every 8 (eight) hours as needed for mild pain or moderate pain.   albuterol  108 (  90 Base) MCG/ACT inhaler Commonly known as: VENTOLIN  HFA USE 2 PUFFS EVERY 6 HOURS AS NEEDED   atorvastatin  80 MG tablet Commonly known as: LIPITOR TAKE ONE (1) TABLET BY MOUTH EVERY DAY   Commode Bedside Misc Bedside commode due to tibial fracture   Wheelchair Misc Pt needs wheelchair due to extremity fractures   Misc. Devices Misc Use rolling walker to ambulate around the house as needed.   diltiazem  240 MG 24 hr capsule Commonly known as: CARDIZEM  CD Take 1 capsule (240 mg total) by mouth daily.   Eliquis  2.5 MG Tabs tablet Generic drug: apixaban  TAKE ONE (1) TABLET BY MOUTH TWO (2) TIMES DAILY    furosemide  20 MG tablet Commonly known as: LASIX  TAKE ONE (1) TABLET BY MOUTH EVERY DAY   levothyroxine  25 MCG tablet Commonly known as: SYNTHROID  Take 1 tablet (25 mcg total) by mouth daily.   lidocaine  4 % Commonly known as: HM Lidocaine  Patch Place 1 patch onto the skin daily.   loperamide  2 MG capsule Commonly known as: IMODIUM  Take 1 capsule (2 mg total) by mouth 4 (four) times daily as needed for diarrhea or loose stools.   metoprolol  tartrate 25 MG tablet Commonly known as: LOPRESSOR  TAKE ONE (1) TABLET BY MOUTH TWO (2) TIMES DAILY   mirtazapine  15 MG disintegrating tablet Commonly known as: REMERON  SOL-TAB Take 1 tablet (15 mg total) by mouth at bedtime.   mometasone  50 MCG/ACT nasal spray Commonly known as: Nasonex  Place 2 sprays into the nose daily.   omeprazole  40 MG capsule Commonly known as: PRILOSEC TAKE ONE CAPSULE BY MOUTH DAILY   ondansetron  4 MG disintegrating tablet Commonly known as: ZOFRAN -ODT Take 1 tablet (4 mg total) by mouth every 8 (eight) hours as needed.   oxazepam  15 MG capsule Commonly known as: SERAX  Take 1 capsule (15 mg total) by mouth 3 (three) times daily as needed for sleep. Started by: Fonda LABOR Melvyn Hommes   polyethylene glycol 17 g packet Commonly known as: MIRALAX  / GLYCOLAX  Take 17 g by mouth daily as needed for moderate constipation.   sucralfate  1 g tablet Commonly known as: CARAFATE  Take 1 tablet (1 g total) by mouth 2 (two) times daily.   triamcinolone  ointment 0.5 % Commonly known as: KENALOG  APPLY TO THE AFFECTED AREA(S) TOPICALLY TWICE DAILY   Vitamin D3 50 MCG (2000 UT) Chew Chew 4,000 Units by mouth daily.         Objective:   BP 129/81   Pulse 84   Ht 5' (1.524 m)   Wt 99 lb (44.9 kg)   SpO2 95%   BMI 19.33 kg/m   Wt Readings from Last 3 Encounters:  06/01/24 99 lb (44.9 kg)  04/23/24 101 lb (45.8 kg)  03/28/24 102 lb 3.2 oz (46.4 kg)    Physical Exam Vitals and nursing note reviewed.   Constitutional:      General: She is not in acute distress.    Appearance: She is well-developed. She is not diaphoretic.  Cardiovascular:     Rate and Rhythm: Normal rate and regular rhythm.     Heart sounds: Normal heart sounds. No murmur heard. Pulmonary:     Effort: Pulmonary effort is normal. No respiratory distress.     Breath sounds: Normal breath sounds. No wheezing.  Skin:    General: Skin is warm and dry.     Findings: No rash.  Neurological:     Mental Status: She is alert and oriented to person, place, and time.  Coordination: Coordination normal.  Psychiatric:        Behavior: Behavior normal.    Physical Exam   CHEST: Lungs clear to auscultation bilaterally. CARDIOVASCULAR: Heart sounds normal, regular rate and rhythm.         Assessment & Plan:   Problem List Items Addressed This Visit       Other   Generalized anxiety disorder - Primary   Relevant Medications   oxazepam  (SERAX ) 15 MG capsule        Generalized anxiety disorder Experienced excessive sedation with current medication. Oxazepam  was effective and is now affordable. - Switch to oxazepam . - Instruct to return unused medication to pharmacist or fire department.  Gastroesophageal reflux disease (GERD) Reports upper abdominal discomfort likely related to GERD.  Unintentional weight loss Desires weight gain. Nutritional supplements cause diarrhea. - Consume half a nutritional supplement in the morning and half in the afternoon. - Encourage eating food with the supplement.          Follow up plan: Return if symptoms worsen or fail to improve, for 2 motnh.  Counseling provided for all of the vaccine components No orders of the defined types were placed in this encounter.   Fonda Levins, MD [facility] 06/01/2024, 4:25 PM

## 2024-06-15 ENCOUNTER — Other Ambulatory Visit (HOSPITAL_COMMUNITY): Payer: Self-pay

## 2024-06-15 ENCOUNTER — Other Ambulatory Visit: Payer: Self-pay | Admitting: Family Medicine

## 2024-06-15 DIAGNOSIS — I4891 Unspecified atrial fibrillation: Secondary | ICD-10-CM

## 2024-07-05 ENCOUNTER — Other Ambulatory Visit: Payer: Self-pay | Admitting: Family Medicine

## 2024-07-05 NOTE — Telephone Encounter (Signed)
 Copied from CRM [id number]. Topic: Clinical - Medication Refill >> Jul 05, 2024 12:34 PM Sophia H wrote: Medication: oxazepam  (SERAX ) 15 MG capsule   Has the patient contacted their pharmacy? Yes, told contact office.   This is the patient's preferred pharmacy:  [facility], [place] - [address] [place] [address] Phone: [phone] Fax: [phone]  Is this the correct pharmacy for this prescription? Yes If no, delete pharmacy and type the correct one.   Has the prescription been filled recently? Yes  Is the patient out of the medication? Has a few left.   Has the patient been seen for an appointment in the last year OR does the patient have an upcoming appointment? Yes, appt on 10/10.  Can we respond through MyChart? No, prefers phone call.   Agent: Please be advised that Rx refills may take up to 3 business days. We ask that you follow-up with your pharmacy.

## 2024-07-06 ENCOUNTER — Telehealth: Payer: Self-pay

## 2024-07-06 NOTE — Telephone Encounter (Signed)
 Patients spouse called to f/u on the PA stated patient needs her medication. Advised husband the PA will take a few days but was just sent 9/19

## 2024-07-06 NOTE — Telephone Encounter (Signed)
 PA request has been Submitted. New Encounter has been or will be created for follow up. For additional info see Pharmacy Prior Auth telephone encounter from 07/06/24.

## 2024-07-06 NOTE — Telephone Encounter (Signed)
 Elmer the spouse called in stating he missed a call. I told him the PA has been sent in to get her medication and that it just takes a few days.He said ok and thank you very much for letting him know.

## 2024-07-06 NOTE — Telephone Encounter (Signed)
 Patient's husband called to follow up on refill.

## 2024-07-06 NOTE — Telephone Encounter (Signed)
 Pharmacy Patient Advocate Encounter   Received notification from Pt Calls Messages that prior authorization for Oxazepam  15MG  capsules is required/requested.   Insurance verification completed.   The patient is insured through [employer] .   Per test claim: PA required; PA submitted to above mentioned insurance via Latent Key/confirmation #/EOC [facility] Status is pending

## 2024-07-06 NOTE — Telephone Encounter (Signed)
 Copied from CRM [id number]. Topic: Clinical - Prescription Issue >> Jul 06, 2024 10:40 AM Tonda B wrote: Reason for CRM: oxazepam  (SERAX ) 15 MG capsule  needs prior authorization please call pt back [phone] (H) [phone] (M)

## 2024-07-06 NOTE — Telephone Encounter (Signed)
 Left message making pt aware that the delay on medication is because of a PA being needed. Advised that we will call once approved or denied.

## 2024-07-09 ENCOUNTER — Telehealth: Payer: Self-pay | Admitting: Family Medicine

## 2024-07-09 NOTE — Telephone Encounter (Signed)
 Pharmacy Patient Advocate Encounter  Received notification from [employer] that Prior Authorization for Oxazepam  15MG  capsules  has been DENIED.  Full denial letter will be uploaded to the media tab. See denial reason below.   PA #/Case ID/Reference #: [id number]

## 2024-07-09 NOTE — Telephone Encounter (Signed)
 Left message making pt aware and to call back with what she would like to do.

## 2024-07-09 NOTE — Telephone Encounter (Signed)
 Duplicate check PA phone message

## 2024-07-09 NOTE — Telephone Encounter (Unsigned)
 Copied from CRM #[id number]. Topic: General - Other >> Jul 09, 2024  3:54 PM Sophia H wrote: Reason for CRM: Patients husband Ayesha is returning a missed call from [facility] regarding a PA denial - Called [employer] but not available. Please reach out # [phone]

## 2024-07-09 NOTE — Telephone Encounter (Signed)
 Copied from CRM [id number]. Topic: Clinical - Medication Prior Auth >> Jul 09, 2024 11:09 AM [name] wrote: Reason for CRM: Patient's husband called in to follow up on PA. Stated his wife needs her medicine and would like to know what's next

## 2024-07-09 NOTE — Telephone Encounter (Signed)
 Pt called and wanted to know what nerve medicine is she going to be prescribed.

## 2024-07-09 NOTE — Telephone Encounter (Signed)
 Please let her know that the prior authorization for her oxazepam  has been denied by her insurance company.  At this point she can either go back to the other medication that we had her on that her husband is also on or she can pay out-of-pocket for the medicine but it looks like her insurance will not cover it

## 2024-07-10 NOTE — Telephone Encounter (Signed)
 I spoke with patients husband about this and he said patient is okay with going back to taking the Lorazepam  but wants a lower dose if possible because the dosage she took before made her very tired. Please advise.

## 2024-07-10 NOTE — Telephone Encounter (Signed)
 Pt's husband called to report that the patient does not like the lorazepam  because it makes her tired, she prefers to take the other I just dont know the name of it

## 2024-07-10 NOTE — Telephone Encounter (Signed)
 Duplicate. Closing encounter.

## 2024-07-10 NOTE — Telephone Encounter (Signed)
 I called and spoke with Mr Devargas and made him aware that Dr Dettinger will be back in the office tomorrow to address this. He voiced understanding.

## 2024-07-10 NOTE — Telephone Encounter (Signed)
 The spouse of the patient called in to check on the status of this medication request. I spoke with Rilla and she said that [facility] was tied up and will ave to call the patient back. Please assist patient further as Mr Zarr is very worried about her and wants to make sure she has her medicine.

## 2024-07-10 NOTE — Telephone Encounter (Signed)
 Left message advising pt to call back. Her insurance will no longer cover oxazepam . Dr. Maryanne is suggesting that she go back to Lorazepam  ( Ativan ) like she had to do this past July. It is the same medication that her husband is prescribed.

## 2024-07-10 NOTE — Telephone Encounter (Signed)
 I called and spoke with pts husband and documented in another telephone encounter. This one is duplicate. Will close.

## 2024-07-11 ENCOUNTER — Other Ambulatory Visit: Payer: Self-pay | Admitting: Family Medicine

## 2024-07-11 MED ORDER — LORAZEPAM 0.5 MG PO TABS: 0.5000 mg | ORAL_TABLET | Freq: Three times a day (TID) | ORAL | 1 refills | Status: DC | PRN

## 2024-07-11 NOTE — Telephone Encounter (Signed)
 I have attempted to contact this patient by phone - rings once and disconnects unable to leave message 2 attempts made

## 2024-07-11 NOTE — Telephone Encounter (Signed)
 Sent lorazepam  for her

## 2024-07-11 NOTE — Addendum Note (Signed)
 Addended by: MARYANNE CHEW on: 07/11/2024 12:29 PM   Modules accepted: Orders

## 2024-07-17 NOTE — Telephone Encounter (Signed)
 I called and spoke with patient and she confirmed that she is aware and has already picked up the Rx.

## 2024-07-24 ENCOUNTER — Other Ambulatory Visit: Payer: Self-pay | Admitting: Family Medicine

## 2024-07-27 ENCOUNTER — Encounter: Payer: Self-pay | Admitting: Family Medicine

## 2024-07-27 ENCOUNTER — Ambulatory Visit: Admitting: Family Medicine

## 2024-07-27 VITALS — BP 131/79 | HR 75 | Ht 60.0 in | Wt 101.0 lb

## 2024-07-27 DIAGNOSIS — E782 Mixed hyperlipidemia: Secondary | ICD-10-CM

## 2024-07-27 DIAGNOSIS — N1832 Chronic kidney disease, stage 3b: Secondary | ICD-10-CM | POA: Diagnosis not present

## 2024-07-27 DIAGNOSIS — I10 Essential (primary) hypertension: Secondary | ICD-10-CM

## 2024-07-27 DIAGNOSIS — R7303 Prediabetes: Secondary | ICD-10-CM | POA: Diagnosis not present

## 2024-07-27 DIAGNOSIS — F411 Generalized anxiety disorder: Secondary | ICD-10-CM

## 2024-07-27 DIAGNOSIS — Z23 Encounter for immunization: Secondary | ICD-10-CM

## 2024-07-27 DIAGNOSIS — I4891 Unspecified atrial fibrillation: Secondary | ICD-10-CM

## 2024-07-27 DIAGNOSIS — E034 Atrophy of thyroid (acquired): Secondary | ICD-10-CM

## 2024-07-27 LAB — BAYER DCA HB A1C WAIVED: HB A1C (BAYER DCA - WAIVED): 5.7 % — ABNORMAL HIGH (ref 4.8–5.6)

## 2024-07-27 MED ORDER — APIXABAN 2.5 MG PO TABS: 2.5000 mg | ORAL_TABLET | Freq: Two times a day (BID) | ORAL | 3 refills | Status: AC

## 2024-07-27 MED ORDER — LORAZEPAM 0.5 MG PO TABS: 0.5000 mg | ORAL_TABLET | Freq: Three times a day (TID) | ORAL | 3 refills | Status: AC | PRN

## 2024-07-27 MED ORDER — ATORVASTATIN CALCIUM 80 MG PO TABS: 80.0000 mg | ORAL_TABLET | Freq: Every day | ORAL | 3 refills | Status: AC

## 2024-07-27 NOTE — Progress Notes (Signed)
 BP 131/79   Pulse 75   Ht 5' (1.524 m)   Wt 101 lb (45.8 kg)   SpO2 96%   BMI 19.73 kg/m    Subjective:   Patient ID: [name], female    DOB: [birth year], 83 y.o.   MRN: [id number]  HPI: [name] is a 83 y.o. female presenting on 07/27/2024 for Hospitalization Follow-up and Anxiety   Discussed the use of AI scribe software for clinical note transcription with the patient, who gave verbal consent to proceed.  History of Present Illness   An 83 year old female presents for a recheck of her medications, thyroid , and cholesterol management.  Insomnia - Difficulty initiating sleep, did not fall asleep until morning - Persistent sleep disturbance  Gait instability and falls - Frequent staggering and unsteady gait - Recent fall after prolonged sitting due to foot numbness, no injuries sustained - Ongoing issues with balance and lower extremity strength  Anticoagulation therapy - Currently taking a blood thinner - No bleeding complications reported - Scheduled for an ultrasound in December for ongoing monitoring  Knee pain - Regular use of a heated chair for symptomatic relief of knee discomfort  Medication access issues [employer] company not covering oxazepam , previously used medication - Unsuccessful attempts to resolve coverage with insurance  Physical activity - Engages in regular walking around her building for exercise - Walks with a neighbor, Beryl Seip          Relevant past medical, surgical, family and social history reviewed and updated as indicated. Interim medical history since our last visit reviewed. Allergies and medications reviewed and updated.  Review of Systems  Constitutional:  Negative for chills and fever.  HENT:  Negative for congestion, ear discharge and ear pain.   Eyes:  Negative for redness and visual disturbance.  Respiratory:  Negative for chest tightness and shortness of breath.   Cardiovascular:  Negative for chest  pain and leg swelling.  Genitourinary:  Negative for difficulty urinating and dysuria.  Skin:  Negative for rash.  Neurological:  Negative for dizziness, light-headedness and headaches.  Psychiatric/Behavioral:  Positive for dysphoric mood. Negative for agitation and behavioral problems. The patient is nervous/anxious.   All other systems reviewed and are negative.   Per HPI unless specifically indicated above   Allergies as of 07/27/2024       Reactions   Bisphosphonates Other (See Comments)   Difficulty swallowing   Ciprofloxacin Nausea And Vomiting   Codeine  Nausea And Vomiting   Sulfa Antibiotics Nausea And Vomiting        Medication List        Accurate as of July 27, 2024 10:46 AM. If you have any questions, ask your nurse or doctor.          acetaminophen  500 MG tablet Commonly known as: TYLENOL  Take 500 mg by mouth every 8 (eight) hours as needed for mild pain or moderate pain.   albuterol  108 (90 Base) MCG/ACT inhaler Commonly known as: VENTOLIN  HFA USE 2 PUFFS EVERY 6 HOURS AS NEEDED   apixaban  2.5 MG Tabs tablet Commonly known as: Eliquis  Take 1 tablet (2.5 mg total) by mouth 2 (two) times daily. What changed: See the new instructions. Changed by: Fonda LABOR Jamille Yoshino   atorvastatin  80 MG tablet Commonly known as: LIPITOR Take 1 tablet (80 mg total) by mouth daily. What changed: how much to take Changed by: Fonda LABOR Deran Barro   Commode Bedside Misc Bedside commode due to tibial fracture  Wheelchair Misc Pt needs wheelchair due to extremity fractures   Misc. Devices Misc Use rolling walker to ambulate around the house as needed.   diltiazem  240 MG 24 hr capsule Commonly known as: CARDIZEM  CD Take 1 capsule (240 mg total) by mouth daily.   furosemide  20 MG tablet Commonly known as: LASIX  TAKE ONE (1) TABLET BY MOUTH EVERY DAY   levothyroxine  25 MCG tablet Commonly known as: SYNTHROID  Take 1 tablet (25 mcg total) by mouth daily.    lidocaine  4 % Commonly known as: HM Lidocaine  Patch Place 1 patch onto the skin daily.   loperamide  2 MG capsule Commonly known as: IMODIUM  Take 1 capsule (2 mg total) by mouth 4 (four) times daily as needed for diarrhea or loose stools.   LORazepam  0.5 MG tablet Commonly known as: ATIVAN  Take 1 tablet (0.5 mg total) by mouth every 8 (eight) hours as needed for anxiety.   metoprolol  tartrate 25 MG tablet Commonly known as: LOPRESSOR  TAKE ONE (1) TABLET BY MOUTH TWO (2) TIMES DAILY   mirtazapine  15 MG disintegrating tablet Commonly known as: REMERON  SOL-TAB Take 1 tablet (15 mg total) by mouth at bedtime.   mometasone  50 MCG/ACT nasal spray Commonly known as: Nasonex  Place 2 sprays into the nose daily.   omeprazole  40 MG capsule Commonly known as: PRILOSEC TAKE ONE CAPSULE BY MOUTH DAILY   ondansetron  4 MG disintegrating tablet Commonly known as: ZOFRAN -ODT Take 1 tablet (4 mg total) by mouth every 8 (eight) hours as needed.   polyethylene glycol 17 g packet Commonly known as: MIRALAX  / GLYCOLAX  Take 17 g by mouth daily as needed for moderate constipation.   sucralfate  1 g tablet Commonly known as: CARAFATE  Take 1 tablet (1 g total) by mouth 2 (two) times daily.   triamcinolone  ointment 0.5 % Commonly known as: KENALOG  APPLY TO THE AFFECTED AREA(S) TOPICALLY TWICE DAILY   Vitamin D3 50 MCG (2000 UT) Chew Chew 4,000 Units by mouth daily.         Objective:   BP 131/79   Pulse 75   Ht 5' (1.524 m)   Wt 101 lb (45.8 kg)   SpO2 96%   BMI 19.73 kg/m   Wt Readings from Last 3 Encounters:  07/27/24 101 lb (45.8 kg)  06/01/24 99 lb (44.9 kg)  04/23/24 101 lb (45.8 kg)    Physical Exam Physical Exam   NECK: Thyroid  normal, no lumps. CHEST: Lungs clear to auscultation bilaterally. CARDIOVASCULAR: Regular heart rate and rhythm, no murmurs.         Assessment & Plan:   Problem List Items Addressed This Visit       Cardiovascular and Mediastinum    Essential hypertension   Relevant Medications   atorvastatin  (LIPITOR) 80 MG tablet   apixaban  (ELIQUIS ) 2.5 MG TABS tablet     Endocrine   Hypothyroid - Primary   Relevant Orders   TSH     Genitourinary   Chronic kidney disease (CKD) stage G3b/A1, moderately decreased glomerular filtration rate (GFR) between 30-44 mL/min/1.73 square meter and albuminuria creatinine ratio less than 30 mg/g (HCC)     Other   Hyperlipemia   Relevant Medications   atorvastatin  (LIPITOR) 80 MG tablet   apixaban  (ELIQUIS ) 2.5 MG TABS tablet   Other Relevant Orders   CMP14+EGFR   Generalized anxiety disorder   Relevant Medications   LORazepam  (ATIVAN ) 0.5 MG tablet   Prediabetes   Relevant Orders   Bayer DCA Hb A1c Waived  CMP14+EGFR   Other Visit Diagnoses       New onset a-fib (HCC)       Relevant Medications   atorvastatin  (LIPITOR) 80 MG tablet   apixaban  (ELIQUIS ) 2.5 MG TABS tablet     Atrial fibrillation with RVR (HCC)       Relevant Medications   atorvastatin  (LIPITOR) 80 MG tablet   apixaban  (ELIQUIS ) 2.5 MG TABS tablet          Gait instability and lower extremity weakness Gait instability and lower extremity weakness with recent fall due to foot falling asleep. - Encouraged walking for exercise. - Discussed potential physical therapy options if desired in the future.  Generalized anxiety disorder Generalized anxiety disorder managed with lorazepam  due to insurance issues with oxazepam . - Continue lorazepam  until new insurance is in place. - Reassess medication options with new insurance in January.  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 - Monitor kidney function with blood work.  Atrophy of thyroid  (acquired) Thyroid  function appears stable. - Check thyroid  function tests with blood work.  Mixed hyperlipidemia - Check lipid panel with blood work.          Follow up plan: Return in about 4 months (around 11/27/2024), or if symptoms  worsen or fail to improve, for Anxiety and prediabetes.  Counseling provided for all of the vaccine components Orders Placed This Encounter  Procedures   Bayer DCA Hb A1c Waived   CMP14+EGFR   TSH    Fonda Levins, MD [facility] 07/27/2024, 10:46 AM

## 2024-07-28 LAB — CMP14+EGFR
ALT: 12 IU/L (ref 0–32)
AST: 24 IU/L (ref 0–40)
Albumin: 4.5 g/dL (ref 3.7–4.7)
Alkaline Phosphatase: 120 IU/L (ref 48–129)
BUN/Creatinine Ratio: 20 (ref 12–28)
BUN: 27 mg/dL (ref 8–27)
Bilirubin Total: 0.5 mg/dL (ref 0.0–1.2)
CO2: 20 mmol/L (ref 20–29)
Calcium: 9.6 mg/dL (ref 8.7–10.3)
Chloride: 100 mmol/L (ref 96–106)
Creatinine, Ser: 1.32 mg/dL — ABNORMAL HIGH (ref 0.57–1.00)
Globulin, Total: 3 g/dL (ref 1.5–4.5)
Glucose: 89 mg/dL (ref 70–99)
Potassium: 4.7 mmol/L (ref 3.5–5.2)
Sodium: 139 mmol/L (ref 134–144)
Total Protein: 7.5 g/dL (ref 6.0–8.5)
eGFR: 40 mL/min/1.73 — ABNORMAL LOW (ref >59)

## 2024-07-28 LAB — TSH: TSH: 1.4 u[IU]/mL (ref 0.450–4.500)

## 2024-08-03 ENCOUNTER — Ambulatory Visit: Payer: Self-pay | Admitting: Family Medicine

## 2024-08-23 ENCOUNTER — Other Ambulatory Visit: Payer: Self-pay | Admitting: Family Medicine

## 2024-08-23 DIAGNOSIS — K219 Gastro-esophageal reflux disease without esophagitis: Secondary | ICD-10-CM

## 2024-08-30 ENCOUNTER — Other Ambulatory Visit: Payer: Self-pay | Admitting: *Deleted

## 2024-09-28 ENCOUNTER — Other Ambulatory Visit: Payer: Self-pay | Admitting: Family Medicine

## 2024-09-28 DIAGNOSIS — I10 Essential (primary) hypertension: Secondary | ICD-10-CM

## 2024-10-01 ENCOUNTER — Other Ambulatory Visit: Payer: Self-pay | Admitting: Family Medicine

## 2024-10-01 DIAGNOSIS — I10 Essential (primary) hypertension: Secondary | ICD-10-CM

## 2024-10-04 ENCOUNTER — Other Ambulatory Visit: Payer: Self-pay | Admitting: Family Medicine

## 2024-10-24 ENCOUNTER — Other Ambulatory Visit: Payer: Self-pay | Admitting: Family Medicine

## 2024-11-08 ENCOUNTER — Other Ambulatory Visit: Payer: Self-pay | Admitting: Family Medicine

## 2024-11-08 DIAGNOSIS — K219 Gastro-esophageal reflux disease without esophagitis: Secondary | ICD-10-CM

## 2024-11-09 ENCOUNTER — Other Ambulatory Visit: Payer: Self-pay | Admitting: Family Medicine

## 2024-11-20 ENCOUNTER — Other Ambulatory Visit: Payer: Self-pay | Admitting: Family Medicine

## 2024-11-28 ENCOUNTER — Ambulatory Visit: Payer: Self-pay | Admitting: Family Medicine

## 2025-01-24 ENCOUNTER — Ambulatory Visit: Payer: Self-pay
